# Patient Record
Sex: Female | Born: 1965 | Race: White | Hispanic: No | Marital: Married | Smoking: Former smoker
Health system: Southern US, Community
[De-identification: ages and names within clinical notes are randomized; demographics above are authoritative.]

## PROBLEM LIST (undated history)

## (undated) DIAGNOSIS — I10 Essential (primary) hypertension: Secondary | ICD-10-CM

## (undated) DIAGNOSIS — Z923 Personal history of irradiation: Secondary | ICD-10-CM

## (undated) DIAGNOSIS — R319 Hematuria, unspecified: Secondary | ICD-10-CM

## (undated) DIAGNOSIS — Z8541 Personal history of malignant neoplasm of cervix uteri: Secondary | ICD-10-CM

## (undated) DIAGNOSIS — J302 Other seasonal allergic rhinitis: Secondary | ICD-10-CM

## (undated) DIAGNOSIS — N3289 Other specified disorders of bladder: Secondary | ICD-10-CM

## (undated) DIAGNOSIS — E079 Disorder of thyroid, unspecified: Secondary | ICD-10-CM

## (undated) DIAGNOSIS — N329 Bladder disorder, unspecified: Secondary | ICD-10-CM

## (undated) DIAGNOSIS — J4520 Mild intermittent asthma, uncomplicated: Secondary | ICD-10-CM

## (undated) DIAGNOSIS — R300 Dysuria: Secondary | ICD-10-CM

## (undated) DIAGNOSIS — G709 Myoneural disorder, unspecified: Secondary | ICD-10-CM

## (undated) DIAGNOSIS — R011 Cardiac murmur, unspecified: Secondary | ICD-10-CM

## (undated) HISTORY — DX: Disorder of thyroid, unspecified: E07.9

## (undated) HISTORY — DX: Myoneural disorder, unspecified: G70.9

## (undated) HISTORY — DX: Personal history of irradiation: Z92.3

---

## 1998-01-07 ENCOUNTER — Other Ambulatory Visit: Admission: RE | Admit: 1998-01-07 | Discharge: 1998-01-07 | Payer: Self-pay | Admitting: Obstetrics and Gynecology

## 1998-05-24 ENCOUNTER — Other Ambulatory Visit: Admission: RE | Admit: 1998-05-24 | Discharge: 1998-05-24 | Payer: Self-pay | Admitting: Obstetrics and Gynecology

## 1998-10-11 ENCOUNTER — Other Ambulatory Visit: Admission: RE | Admit: 1998-10-11 | Discharge: 1998-10-11 | Payer: Self-pay | Admitting: Obstetrics and Gynecology

## 1998-11-01 ENCOUNTER — Other Ambulatory Visit: Admission: RE | Admit: 1998-11-01 | Discharge: 1998-11-01 | Payer: Self-pay | Admitting: Obstetrics and Gynecology

## 1999-03-07 ENCOUNTER — Other Ambulatory Visit: Admission: RE | Admit: 1999-03-07 | Discharge: 1999-03-07 | Payer: Self-pay | Admitting: Obstetrics and Gynecology

## 1999-09-29 ENCOUNTER — Inpatient Hospital Stay (HOSPITAL_COMMUNITY): Admission: AD | Admit: 1999-09-29 | Discharge: 1999-09-29 | Payer: Self-pay | Admitting: Obstetrics and Gynecology

## 2000-03-12 ENCOUNTER — Other Ambulatory Visit: Admission: RE | Admit: 2000-03-12 | Discharge: 2000-03-12 | Payer: Self-pay | Admitting: Obstetrics and Gynecology

## 2002-05-12 ENCOUNTER — Other Ambulatory Visit: Admission: RE | Admit: 2002-05-12 | Discharge: 2002-05-12 | Payer: Self-pay | Admitting: Obstetrics and Gynecology

## 2003-05-18 ENCOUNTER — Other Ambulatory Visit: Admission: RE | Admit: 2003-05-18 | Discharge: 2003-05-18 | Payer: Self-pay | Admitting: Obstetrics and Gynecology

## 2004-05-24 ENCOUNTER — Other Ambulatory Visit: Admission: RE | Admit: 2004-05-24 | Discharge: 2004-05-24 | Payer: Self-pay | Admitting: Obstetrics and Gynecology

## 2005-06-07 ENCOUNTER — Other Ambulatory Visit: Admission: RE | Admit: 2005-06-07 | Discharge: 2005-06-07 | Payer: Self-pay | Admitting: Obstetrics and Gynecology

## 2005-11-27 ENCOUNTER — Ambulatory Visit: Payer: Self-pay | Admitting: Family Medicine

## 2005-12-29 ENCOUNTER — Ambulatory Visit: Payer: Self-pay | Admitting: Family Medicine

## 2006-01-15 ENCOUNTER — Ambulatory Visit: Payer: Self-pay | Admitting: Family Medicine

## 2006-02-26 ENCOUNTER — Ambulatory Visit: Payer: Self-pay | Admitting: Family Medicine

## 2006-06-04 ENCOUNTER — Ambulatory Visit: Payer: Self-pay | Admitting: Family Medicine

## 2006-09-04 ENCOUNTER — Telehealth: Payer: Self-pay | Admitting: Family Medicine

## 2006-11-13 ENCOUNTER — Telehealth: Payer: Self-pay | Admitting: Family Medicine

## 2007-05-31 ENCOUNTER — Ambulatory Visit: Payer: Self-pay | Admitting: Family Medicine

## 2007-05-31 DIAGNOSIS — I10 Essential (primary) hypertension: Secondary | ICD-10-CM | POA: Insufficient documentation

## 2007-06-03 LAB — CONVERTED CEMR LAB
ALT: 10 units/L (ref 0–35)
AST: 15 units/L (ref 0–37)
Albumin: 4.2 g/dL (ref 3.5–5.2)
Alkaline Phosphatase: 69 units/L (ref 39–117)
BUN: 9 mg/dL (ref 6–23)
Chloride: 107 meq/L (ref 96–112)
Creatinine, Ser: 0.72 mg/dL (ref 0.40–1.20)
HDL: 41 mg/dL (ref >39)
LDL Cholesterol: 90 mg/dL (ref 0–99)
Potassium: 4.7 meq/L (ref 3.5–5.3)
Total CHOL/HDL Ratio: 3.6

## 2007-11-14 ENCOUNTER — Encounter: Payer: Self-pay | Admitting: Family Medicine

## 2007-11-14 LAB — HM MAMMOGRAPHY: HM Mammogram: NORMAL

## 2008-05-07 ENCOUNTER — Ambulatory Visit: Payer: Self-pay | Admitting: Family Medicine

## 2008-05-07 DIAGNOSIS — F411 Generalized anxiety disorder: Secondary | ICD-10-CM | POA: Insufficient documentation

## 2008-05-15 ENCOUNTER — Telehealth: Payer: Self-pay | Admitting: Family Medicine

## 2008-05-28 ENCOUNTER — Ambulatory Visit: Payer: Self-pay | Admitting: Family Medicine

## 2008-05-28 DIAGNOSIS — G4700 Insomnia, unspecified: Secondary | ICD-10-CM | POA: Insufficient documentation

## 2008-06-02 ENCOUNTER — Telehealth: Payer: Self-pay | Admitting: Family Medicine

## 2008-06-15 ENCOUNTER — Ambulatory Visit: Payer: Self-pay | Admitting: Family Medicine

## 2008-08-31 LAB — CONVERTED CEMR LAB

## 2008-09-02 ENCOUNTER — Ambulatory Visit: Payer: Self-pay | Admitting: Family Medicine

## 2008-09-03 LAB — CONVERTED CEMR LAB
ALT: 10 units/L (ref 0–35)
AST: 19 units/L (ref 0–37)
Albumin: 4.4 g/dL (ref 3.5–5.2)
CO2: 22 meq/L (ref 19–32)
Calcium: 9.3 mg/dL (ref 8.4–10.5)
Chloride: 104 meq/L (ref 96–112)
Cholesterol: 152 mg/dL (ref 0–200)
Platelets: 334 10*3/uL (ref 150–400)
Potassium: 4.7 meq/L (ref 3.5–5.3)
RDW: 13.2 % (ref 11.5–15.5)
Sodium: 138 meq/L (ref 135–145)
TSH: 2.231 microintl units/mL (ref 0.350–4.50)
Total CHOL/HDL Ratio: 3.3
Total Protein: 7.5 g/dL (ref 6.0–8.3)
WBC: 10.4 10*3/uL (ref 4.0–10.5)

## 2008-09-25 HISTORY — PX: FOOT SURGERY: SHX648

## 2008-10-05 ENCOUNTER — Emergency Department (HOSPITAL_COMMUNITY): Admission: EM | Admit: 2008-10-05 | Discharge: 2008-10-06 | Payer: Self-pay | Admitting: Emergency Medicine

## 2008-11-05 ENCOUNTER — Encounter: Payer: Self-pay | Admitting: Family Medicine

## 2008-11-17 ENCOUNTER — Telehealth: Payer: Self-pay | Admitting: Family Medicine

## 2008-12-10 ENCOUNTER — Telehealth: Payer: Self-pay | Admitting: Family Medicine

## 2009-04-20 ENCOUNTER — Encounter: Payer: Self-pay | Admitting: Family Medicine

## 2009-06-21 ENCOUNTER — Telehealth: Payer: Self-pay | Admitting: *Deleted

## 2009-07-07 ENCOUNTER — Ambulatory Visit: Payer: Self-pay | Admitting: Family Medicine

## 2009-07-07 DIAGNOSIS — J4520 Mild intermittent asthma, uncomplicated: Secondary | ICD-10-CM | POA: Insufficient documentation

## 2009-07-12 ENCOUNTER — Telehealth: Payer: Self-pay | Admitting: Family Medicine

## 2009-12-15 ENCOUNTER — Encounter: Payer: Self-pay | Admitting: Family Medicine

## 2010-01-10 ENCOUNTER — Telehealth: Payer: Self-pay | Admitting: Family Medicine

## 2010-10-27 NOTE — Progress Notes (Signed)
Summary: Needs mini inhaler  Phone Note Call from Patient Call back at Home Phone [phone]   Caller: Patient Call For: Nani Gasser MD Summary of Call: Pt uses Ventolin inhaler and went out of town to [place] and left the inhaler there and will not be able to get it back until Thursday but needs something to get her thru until then. Insurance will not pay for a refill on the Ventolin. Can you [facility] and I will call her and let her know that that is where we sent it Initial call taken by: Kathlene November,  January 10, 2010 2:20 PM  Follow-up for Phone Call        Pt notifeid of results. Follow-up by: Kathlene November,  January 10, 2010 3:53 PM    New/Updated Medications: * MINI-INHALER ALBUTEROL 2-3 puffs inhaled every 4-6 hours as needed Prescriptions: MINI-INHALER ALBUTEROL 2-3 puffs inhaled every 4-6 hours as needed  #1 x 0   Entered and Authorized by:   Nani Gasser MD   Signed by:   Nani Gasser MD on 01/10/2010   Method used:   Printed then faxed to ...       [address] [phone]* (retail)       [address] [place], [place]  [address]       Ph: [phone]       Fax: [phone]   RxID:   [id number]

## 2010-10-27 NOTE — Letter (Signed)
Summary: [facility]   Imported By: Lanelle Bal 12/23/2009 10:52:08  _____________________________________________________________________  External Attachment:    Type:   Image     Comment:   External Document

## 2010-11-02 ENCOUNTER — Encounter: Payer: Self-pay | Admitting: Family Medicine

## 2010-11-02 ENCOUNTER — Ambulatory Visit (INDEPENDENT_AMBULATORY_CARE_PROVIDER_SITE_OTHER): Payer: BC Managed Care – PPO | Admitting: Family Medicine

## 2010-11-02 DIAGNOSIS — J45909 Unspecified asthma, uncomplicated: Secondary | ICD-10-CM

## 2010-11-02 DIAGNOSIS — F411 Generalized anxiety disorder: Secondary | ICD-10-CM

## 2010-11-02 DIAGNOSIS — I10 Essential (primary) hypertension: Secondary | ICD-10-CM

## 2010-11-10 NOTE — Assessment & Plan Note (Signed)
Summary: F/u on meds-   Vital Signs:  Patient profile:   45 year old female Height:      62 inches Weight:      141 pounds Pulse rate:   67 / minute BP sitting:   114 / 78  (right arm) Cuff size:   regular  Vitals Entered By: Avon Gully CMA, Duncan Dull) (November 02, 2010 8:12 AM) CC: F/u meds   Primary Care Provider:  Linford Arnold, C  CC:  F/u meds.  History of Present Illness: Has been working long hours at work. Out of the paxil for almost a month. Says this has really affected her mood.    She has also been using her rescue inhaler daily. OUt of her singulair and her symbicort. Also for over a month.  No URI sxs.  Noramlly does really well when on her singulair.   Asthma History    Asthma Control Assessment:    Age range: 12+ years    Symptoms: >2 days/week    Nighttime Awakenings: 0-2/month    Interferes w/ normal activity: no limitations    SABA use (not for EIB): >2 days/week    Asthma Control Assessment: Not Well Controlled   Current Medications (verified): 1)  Metoprolol Tartrate 100 Mg Tabs (Metoprolol Tartrate) .... Take 1 Tablet By Mouth Two Times A Day 2)  Paxil 10 Mg Tabs (Paroxetine Hcl) .... Take 1 Tablet By Mouth Two Times A Day 3)  Trazodone Hcl 50 Mg Tabs (Trazodone Hcl) .... Take 1 Tablet By Mouth Once A Day At Bedtime As Needed Insomonia 4)  Ventolin Hfa 108 (90 Base) Mcg/act Aers (Albuterol Sulfate) .... 2-4 Puffs Inhaled Every  6 Hours As Needed 5)  Symbicort 80-4.5 Mcg/act Aero (Budesonide-Formoterol Fumarate) .... 2 Puffs Two Times A Day 6)  Singulair 10 Mg Tabs (Montelukast Sodium) .... Take 1 Tablet By Mouth Once A Day  Dx: Asthma, Allergies 7)  Mini-Inhaler Albuterol .... 2-3 Puffs Inhaled Every 4-6 Hours As Needed  Allergies (verified): 1)  ! Pcn 2)  ! Sulfa  Comments:  Nurse/Medical Assistant: The patient's medications and allergies were reviewed with the patient and were updated in the Medication and Allergy Lists. Avon Gully CMA,  Duncan Dull) (November 02, 2010 8:13 AM)  Physical Exam  General:  Well-developed,well-nourished,in no acute distress; alert,appropriate and cooperative throughout examination Head:  Normocephalic and atraumatic without obvious abnormalities. No apparent alopecia or balding. Eyes:  No corneal or conjunctival inflammation noted. EOMI. Perrla. Ears:  External ear exam shows no significant lesions or deformities.  Otoscopic examination reveals clear canals, tympanic membranes are intact bilaterally without bulging, retraction, inflammation or discharge. Hearing is grossly normal bilaterally. Nose:  External nasal examination shows no deformity or inflammation.  Mouth:  Oral mucosa and oropharynx without lesions or exudates.  Teeth in good repair. Neck:  No deformities, masses, or tenderness noted. Lungs:  Normal respiratory effort, chest expands symmetrically. Lungs are clear to auscultation, no crackles or wheezes. Heart:  Normal rate and regular rhythm. S1 and S2 normal without gallop, murmur, click, rub or other extra sounds. Skin:  no rashes.   Cervical Nodes:  No lymphadenopathy noted Psych:  Cognition and judgment appear intact. Alert and cooperative with normal attention span and concentration. No apparent delusions, illusions, hallucinations   Impression & Recommendations:  Problem # 1:  GENERALIZED ANXIETY DISORDER (ICD-300.02) Out of Paxil and GAD-7 score of 15.  Will restart her paxil once a day.  Her updated medication list for this problem  includes:    Paxil 10 Mg Tabs (Paroxetine hcl) .[name]... Take 1 tablet by mouth once a day    Trazodone Hcl 50 Mg Tabs (Trazodone hcl) .[name]... Take 1 tablet by mouth once a day at bedtime as needed insomonia  Problem # 2:  HYPERTENSION, BENIGN (ICD-401.1) Looks great today. Contineu current regimen. It has really helped congtrol her palpitations.  Her updated medication list for this problem includes:    Metoprolol Tartrate 100 Mg Tabs (Metoprolol  tartrate) .[name]... Take 1 tablet by mouth two times a day  Problem # 3:  ASTHMA, UNSPECIFIED, UNSPECIFIED STATUS (ICD-493.90) Dsicussed needs to restart her singulari and will start an inhaled corticosteroid. F/U in 6 weeks for recheck.  Her updated medication list for this problem includes:    Ventolin Hfa 108 (90 Base) Mcg/act Aers (Albuterol sulfate) .[name]... 2-4 puffs inhaled every  6 hours as needed    Qvar 40 Mcg/act Aers (Beclomethasone dipropionate) .[name]... 1 puff inhaled    Singulair 10 Mg Tabs (Montelukast sodium) .[name]... Take 1 tablet by mouth once a day  dx: asthma, allergies  Complete Medication List: 1)  Metoprolol Tartrate 100 Mg Tabs (Metoprolol tartrate) .... Take 1 tablet by mouth two times a day 2)  Paxil 10 Mg Tabs (Paroxetine hcl) .... Take 1 tablet by mouth once a day 3)  Trazodone Hcl 50 Mg Tabs (Trazodone hcl) .... Take 1 tablet by mouth once a day at bedtime as needed insomonia 4)  Ventolin Hfa 108 (90 Base) Mcg/act Aers (Albuterol sulfate) .... 2-4 puffs inhaled every  6 hours as needed 5)  Qvar 40 Mcg/act Aers (Beclomethasone dipropionate) .[name].. 1 puff inhaled 6)  Singulair 10 Mg Tabs (Montelukast sodium) .... Take 1 tablet by mouth once a day  dx: asthma, allergies 7)  Mini-inhaler Albuterol  .... 2-3 puffs inhaled every 4-6 hours as needed 8)  Zyrtec Allergy 10 Mg Tabs (Cetirizine hcl) .... Take 1 tablet by mouth once a day at bedtime   Patient Instructions: 1)  QVAR, Flovent  (inhaled steroid for asthma) 2)  follow up in 6 weeks to re-evaluate your asthma and mood Prescriptions: QVAR 40 MCG/ACT AERS (BECLOMETHASONE DIPROPIONATE) 1 puff inhaled  #1 x 1   Entered and Authorized by:   Nani Gasser MD   Signed by:   Nani Gasser MD on 11/02/2010   Method used:   Electronically to        [facility] [phone]* (retail)       [address] [place], [place]  [address]       Ph: [phone]       Fax: [phone]   RxID:   [id number] SINGULAIR 10 MG  TABS (MONTELUKAST SODIUM) Take 1 tablet by mouth once a day  Dx: Asthma, allergies  #90 x 3   Entered and Authorized by:   Nani Gasser MD   Signed by:   Nani Gasser MD on 11/02/2010   Method used:   Electronically to        [facility] [phone]* (retail)       [address] [place], [place]  [address]       Ph: [phone]       Fax: [phone]   RxID:   [id number] TRAZODONE HCL 50 MG TABS (TRAZODONE HCL) Take 1 tablet by mouth once a day at bedtime as needed insomonia  #90 x 3   Entered and  Authorized by:   Nani Gasser MD   Signed by:   Nani Gasser MD on 11/02/2010   Method used:   Electronically to        [facility] [phone]* (retail)       [address] [place], [place]  [address]       Ph: [phone]       Fax: [phone]   RxID:   [id number] PAXIL 10 MG TABS (PAROXETINE HCL) Take 1 tablet by mouth two times a day  #90 x 3   Entered and Authorized by:   Nani Gasser MD   Signed by:   Nani Gasser MD on 11/02/2010   Method used:   Electronically to        [facility] [phone]* (retail)       [address] [place], [place]  [address]       Ph: [phone]       Fax: [phone]   RxID:   [id number] METOPROLOL TARTRATE 100 MG TABS (METOPROLOL TARTRATE) Take 1 tablet by mouth two times a day  #180 x 3   Entered and Authorized by:   Nani Gasser MD   Signed by:   Nani Gasser MD on 11/02/2010   Method used:   Electronically to        [facility] [phone]* (retail)       [address] [place], [place]  [address]       Ph: [phone]       Fax: [phone]   RxID:   [id number]    Orders Added: 1)  Est. Patient Level IV [[address]]

## 2010-11-22 NOTE — Letter (Signed)
Summary: Generalized Anxiety Disorder Scale  Generalized Anxiety Disorder Scale   Imported By: Maryln Gottron 11/14/2010 14:03:12  _____________________________________________________________________  External Attachment:    Type:   Image     Comment:   External Document

## 2010-12-09 ENCOUNTER — Encounter: Payer: Self-pay | Admitting: Family Medicine

## 2010-12-14 ENCOUNTER — Ambulatory Visit (INDEPENDENT_AMBULATORY_CARE_PROVIDER_SITE_OTHER): Payer: BC Managed Care – PPO | Admitting: Family Medicine

## 2010-12-14 VITALS — BP 121/83 | HR 78 | Ht 62.0 in | Wt 141.0 lb

## 2010-12-14 DIAGNOSIS — G4700 Insomnia, unspecified: Secondary | ICD-10-CM

## 2010-12-14 DIAGNOSIS — F329 Major depressive disorder, single episode, unspecified: Secondary | ICD-10-CM

## 2010-12-14 DIAGNOSIS — F32A Depression, unspecified: Secondary | ICD-10-CM

## 2010-12-14 DIAGNOSIS — I10 Essential (primary) hypertension: Secondary | ICD-10-CM

## 2010-12-14 MED ORDER — ZOLPIDEM TARTRATE 10 MG PO TABS: 10.0000 mg | ORAL_TABLET | Freq: Every evening | ORAL | Status: DC | PRN

## 2010-12-14 NOTE — Patient Instructions (Signed)
  Insomnia Insomnia means you have trouble falling or staying asleep.  It affects about one person in three at different times and is usually related to stress from work, school, or personal relations. Insomnia is also a sign of depression or anxiety. Other medical problems that cause insomnia include conditions that cause pain, night leg cramps, coughing, shortness of breath, urinary problems, and fevers. Sleep apnea is an abnormal breathing pattern at night that can cause insomnia and loud snoring. Certain medications and excess intake of caffeine drinks (coffee, tea, colas) can also interfere with normal sleep. Treatment for insomnia depends on the cause. Besides specific medical treatment, the following measures can help you relax and get better sleep. Get regular exercise every day, at least several hours before bed time. Try to get to bed at the same time every night. Take a hot bath before retiring to help you relax. Do not stay in bed if you are unable to sleep. During the daytime avoid staying in bed to watch television, eat, or read. Reduce unwanted noise and light in your room. Keep your room at a comfortable temperature. Avoid alcohol as it causes one to sleep less soundly, may cause you to awaken during the night, and can leave you feeling groggy the next day. Using a mild sedative prescribed or suggested by your caregiver may be needed, but the daily use of sleeping pills is not recommended. Anti-depressant medicines can improve sleep in people with depression. Please call your doctor for follow up care to better understand the cause and proper treatment of your insomnia. Document Released: 10/19/2004 Document Re-Released: 12/08/2008 [facility] Patient Information 2011 [place], [place].

## 2010-12-14 NOTE — Progress Notes (Signed)
  Subjective:    Patient ID: Anna Ramsey, female    DOB: 24-Oct-1965, 45 y.o.   MRN: [id number]  HPI Trazodone really helps her sleep, once she falls asleep. .  Does have vivid dreams with it.  Still having  A hard time falling alseep. Often works until 9PM. Does have chronic insomnia. Her mood has been very well controlled. No SE of meds.  Focus and concentration are good. 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    Review of Systems     Objective:   Physical Exam  Cardiovascular: Normal rate, regular rhythm and normal heart sounds.   Pulmonary/Chest: Effort normal and breath sounds normal.   BP 121/83  Pulse 78  Wt 141 lb (63.957 kg)        Assessment & Plan:  Depression doing well. PHQ--9 score of 2. At goal. Continue current regimen. F/U in 4 months. Continue to get regular exercise.     CInsomnia- sleep induction if a problem.  Will d/c trazodone and change to [place].  F/U 6 weeks for sleep.

## 2010-12-14 NOTE — Assessment & Plan Note (Signed)
AT goal today. Continue current regimen.

## 2011-01-17 ENCOUNTER — Telehealth: Payer: Self-pay | Admitting: *Deleted

## 2011-01-17 ENCOUNTER — Ambulatory Visit (INDEPENDENT_AMBULATORY_CARE_PROVIDER_SITE_OTHER): Payer: BC Managed Care – PPO | Admitting: Family Medicine

## 2011-01-17 VITALS — BP 115/72 | HR 68 | Wt 141.0 lb

## 2011-01-17 DIAGNOSIS — G4700 Insomnia, unspecified: Secondary | ICD-10-CM

## 2011-01-17 MED ORDER — ZOLPIDEM TARTRATE ER 12.5 MG PO TBCR: 12.5000 mg | EXTENDED_RELEASE_TABLET | Freq: Every evening | ORAL | Status: DC | PRN

## 2011-01-17 MED ORDER — ESZOPICLONE 3 MG PO TABS: 3.0000 mg | ORAL_TABLET | Freq: Every evening | ORAL | Status: DC | PRN

## 2011-01-17 NOTE — Progress Notes (Signed)
  Subjective:    Patient ID: Anna Ramsey, female    DOB: November 07, 1965, 45 y.o.   MRN: [id number]  HPI  See the A&P.   Review of Systems     Objective:   Physical Exam  Constitutional: She appears well-developed and well-nourished.  HENT:  Head: Normocephalic and atraumatic.  Neck: No thyromegaly present.  Cardiovascular: Normal rate, regular rhythm and normal heart sounds.   Pulmonary/Chest: Effort normal and breath sounds normal.  Lymphadenopathy:    She has no cervical adenopathy.          Assessment & Plan:

## 2011-01-17 NOTE — Telephone Encounter (Signed)
Pt LMOM stating Alfonso Patten is too expensive and would like this changed to Ambien CR.

## 2011-01-17 NOTE — Assessment & Plan Note (Addendum)
The Remus Loffler works really well to fall asleep. Says will fall asleep in about 10-20 min but then tosses and turns all night.  Feels the trazodone makes her sleep deeper but takes about 1 hour to sleep.  She tried taking them together and that worked really well. Discussed trial fo lunesta or tiral of ambien CR or possibly just taking her trazodone earlier in the evening.  She would like to try lunesta first.

## 2011-01-17 NOTE — Telephone Encounter (Signed)
OK, will fax over new rx.

## 2011-01-20 ENCOUNTER — Telehealth: Payer: Self-pay | Admitting: Family Medicine

## 2011-01-20 NOTE — Telephone Encounter (Signed)
Telephone call received from patjent stating we faxed over a script on Tuesday for Ambien CR since Lunesta was a tier 3 (too expensive).  Pt is calling back this am saying script is not at pharmacy [facility].  Since med has been ordered by M.D.will proceed to get script taken care of for pt. PLAN: 1.  Called [facility] and gave prescription verbally.\             2.  Notified pt that script was called, but had to [facility] that done.

## 2011-02-15 ENCOUNTER — Other Ambulatory Visit: Payer: Self-pay | Admitting: Family Medicine

## 2011-02-15 NOTE — Telephone Encounter (Signed)
Pt requesting refill albuterol MDI.  Left her medication in [facility] accidentally.  It is for albuterol mini inhaler. Plan:  #1/2 refills sent to [facility] escribe. Jarvis Newcomer, LPN Domingo Dimes

## 2011-02-16 ENCOUNTER — Other Ambulatory Visit: Payer: Self-pay | Admitting: Family Medicine

## 2011-02-16 MED ORDER — AMBULATORY NON FORMULARY MEDICATION: 90.0000 ug | Status: DC | PRN

## 2011-02-16 NOTE — Telephone Encounter (Signed)
Pt called and said the mini inhaler was not at the pharm yesterday as we had talked about.   Plan:  Called [facility] and they can no longer get the mini inhaler from their supplier, but they could do 90 mcg mini inhaler.  Ordered #1/2 refills and pt informed to pick up today. Jarvis Newcomer, LPN Domingo Dimes

## 2011-02-23 NOTE — Telephone Encounter (Signed)
This encounter was completed by the triage nurse, therefore this encounter was closed. Jarvis Newcomer, LPN Domingo Dimes

## 2011-05-30 ENCOUNTER — Other Ambulatory Visit: Payer: Self-pay | Admitting: Family Medicine

## 2011-09-20 ENCOUNTER — Other Ambulatory Visit: Payer: Self-pay | Admitting: Family Medicine

## 2011-09-22 ENCOUNTER — Emergency Department
Admission: EM | Admit: 2011-09-22 | Discharge: 2011-09-22 | Disposition: A | Discharge status: Home / Self Care | Payer: BC Managed Care – PPO | Source: Home / Self Care | Attending: Family Medicine | Admitting: Family Medicine

## 2011-09-22 ENCOUNTER — Encounter: Payer: Self-pay | Admitting: Emergency Medicine

## 2011-09-22 DIAGNOSIS — R42 Dizziness and giddiness: Secondary | ICD-10-CM

## 2011-09-22 DIAGNOSIS — J069 Acute upper respiratory infection, unspecified: Secondary | ICD-10-CM

## 2011-09-22 LAB — POCT CBC W AUTO DIFF (K'VILLE URGENT CARE)

## 2011-09-22 MED ORDER — MECLIZINE HCL 25 MG PO TABS: ORAL_TABLET | ORAL | Status: DC

## 2011-09-22 MED ORDER — BENZONATATE 200 MG PO CAPS: 200.0000 mg | ORAL_CAPSULE | Freq: Every day | ORAL | Status: AC

## 2011-09-22 NOTE — ED Provider Notes (Signed)
History     CSN: [id number]  Arrival date & time 09/22/11  1048   First MD Initiated Contact with Patient 09/22/11 1307      Chief Complaint  Patient presents with  . Fatigue      HPI Comments: HPI : Flu symptoms started about 4 days ago.  Complains of onset of chills, sweats, myalgias, fatigue, headache, cough.  Symptoms are progressively worsening, despite trying OTC fever reducing medicine and rest and fluids. Has decreased appetite, but tolerating some liquids by mouth.  She has had occasional nausea with vomiting, and feels dizzy with movement.  She had a flu shot in October.   Review of Systems: Positive for fatigue, mild nasal congestion, mild sore throat, mild swollen anterior neck glands, mild cough, nausea, dizziness. Negative for acute vision changes, stiff neck, focal weakness, syncope, seizures, respiratory distress, diarrhea, GU symptoms.   The history is provided by the patient.    Past Medical History  Diagnosis Date  . Asthma     hx in childhood    Past Surgical History  Procedure Date  . Foot surgery     Family History  Problem Relation Age of Onset  . Heart attack Father 21  . Diabetes Father   . Hypertension Maternal Grandmother   . Stroke Maternal Grandmother     History  Substance Use Topics  . Smoking status: Former Smoker    Quit date: 09/25/2004  . Smokeless tobacco: Not on file  . Alcohol Use: 0.5 oz/week    1 drink(s) per week    OB History    Grav Para Term Preterm Abortions TAB SAB Ect Mult Living                  Review of Systems No sore throat + cough No pleuritic pain No wheezing Minimal nasal congestion No post-nasal drainage No sinus pain/pressure No itchy/red eyes No earache No hemoptysis No SOB + fever/chills + nausea + occasional vomiting No abdominal pain No diarrhea No urinary symptoms No skin rashes + fatigue + myalgias + headache Used OTC meds without relief  Allergies  Penicillins and  Sulfonamide derivatives  Home Medications   Current Outpatient Rx  Name Route Sig Dispense Refill  . AMBULATORY NON FORMULARY MEDICATION Inhalation Inhale 90 mcg into the lungs every 4 (four) hours as needed. Medication Name: **mini inhaler albuterol* 1 Inhaler 2  . BECLOMETHASONE DIPROPIONATE 40 MCG/ACT IN AERS Inhalation Inhale 1 puff into the lungs.      [name] BENZONATATE 200 MG PO CAPS Oral Take 1 capsule (200 mg total) by mouth at bedtime. Take as needed for cough 12 capsule 0  . CETIRIZINE HCL 10 MG PO TABS Oral Take 10 mg by mouth at bedtime.      [name] ESZOPICLONE 3 MG PO TABS Oral Take 1 tablet (3 mg total) by mouth at bedtime as needed. Take immediately before bedtime 30 tablet 0  . MECLIZINE HCL 25 MG PO TABS  One tab by mouth 3 or 4 times daily as needed for dizziness. 20 tablet 0  . METOPROLOL TARTRATE 100 MG PO TABS Oral Take 100 mg by mouth 2 (two) times daily.      [name] MONTELUKAST SODIUM 10 MG PO TABS Oral Take 10 mg by mouth daily.      [name] PAROXETINE HCL 10 MG PO TABS Oral Take 10 mg by mouth every morning.      . VENTOLIN HFA 108 (90 BASE) MCG/ACT IN AERS  INHALE  TWO TO FOUR PUFFS BY MOUTH EVERY 6 HOURS AS NEEDED 18 g 1  . ZOLPIDEM TARTRATE ER 12.5 MG PO TBCR Oral Take 1 tablet (12.5 mg total) by mouth at bedtime as needed for sleep. 30 tablet 1    BP 145/105  Pulse 92  Temp(Src) 98.2 F (36.8 C) (Oral)  Resp 16  Ht 5' 1.5" (1.562 m)  Wt 143 lb (64.864 kg)  BMI 26.58 kg/m2  SpO2 100%  Physical Exam Nursing notes and Vital Signs reviewed. Appearance:  Patient appears healthy, stated age, and in no acute distress Eyes:  Pupils are equal, round, and reactive to light and accomodation.  Extraocular movement is intact.  Conjunctivae are not inflamed  Ears:  Canals normal.  Tympanic membranes normal.  Nose:  Mildly congested turbinates.  No sinus tenderness.   Pharynx:  Normal Neck:  Supple.  Tender shotty posterior nodes are palpated bilaterally  Lungs:  Clear to auscultation.   Breath sounds are equal.  Heart:  Regular rate and rhythm without murmurs, rubs, or gallops.  Abdomen:  Nontender without masses or hepatosplenomegaly.  Bowel sounds are present.  No CVA or flank tenderness.  Extremities:  No edema.  No calf tenderness Skin:  No rash present.   ED Course  Procedures none   Labs Reviewed  POCT CBC W AUTO DIFF ([facility]):  CBC:  WBC 6.4; LY 32.7; MO 4.8; GR 62.5; Hgb 17.2       1. Vertigo   2. Acute upper respiratory infections of unspecified site       MDM  There is no evidence of bacterial infection today.  Suspect viral URI with vertigo. Treat symptomatically for now:  Begin Antivert for dizziness and nausea.  Tessalon for cough at night. Take Mucinex  (guaifenesin) twice daily for congestion.  Increase fluid intake, rest. May use Afrin nasal spray (or generic oxymetazoline) twice daily for about 5 days.  Also recommend using saline nasal spray several times daily and/or saline nasal irrigation. Stop all antihistamines for now, and other non-prescription cough/cold preparations. May take Tylenol for headache. Follow-up with family doctor if not improving about one week.        Donna Christen, MD 09/22/11 [phone]

## 2011-09-22 NOTE — ED Notes (Signed)
Extremely fatigued, cough, nausea x 5 days; did have Flu vaccine in October; no fever.

## 2011-09-28 ENCOUNTER — Ambulatory Visit: Payer: BC Managed Care – PPO | Admitting: Family Medicine

## 2011-11-01 ENCOUNTER — Other Ambulatory Visit: Payer: Self-pay | Admitting: Family Medicine

## 2012-01-31 ENCOUNTER — Other Ambulatory Visit: Payer: Self-pay | Admitting: Family Medicine

## 2012-01-31 NOTE — Telephone Encounter (Signed)
Pt has sent request for a refill on Trazadone. Pt has was on Trazadone over a year ago. Please advise if we can refill.

## 2012-01-31 NOTE — Telephone Encounter (Signed)
Refill for 1 month but needs office visit.

## 2012-02-06 ENCOUNTER — Encounter: Payer: Self-pay | Admitting: *Deleted

## 2012-02-06 ENCOUNTER — Emergency Department (INDEPENDENT_AMBULATORY_CARE_PROVIDER_SITE_OTHER)
Admission: EM | Admit: 2012-02-06 | Discharge: 2012-02-06 | Disposition: A | Discharge status: Home / Self Care | Payer: BC Managed Care – PPO | Source: Home / Self Care | Attending: Emergency Medicine | Admitting: Emergency Medicine

## 2012-02-06 DIAGNOSIS — T65891A Toxic effect of other specified substances, accidental (unintentional), initial encounter: Secondary | ICD-10-CM

## 2012-02-06 DIAGNOSIS — J680 Bronchitis and pneumonitis due to chemicals, gases, fumes and vapors: Secondary | ICD-10-CM

## 2012-02-06 DIAGNOSIS — R062 Wheezing: Secondary | ICD-10-CM

## 2012-02-06 HISTORY — DX: Other seasonal allergic rhinitis: J30.2

## 2012-02-06 HISTORY — DX: Essential (primary) hypertension: I10

## 2012-02-06 MED ORDER — PREDNISONE (PAK) 10 MG PO TABS: 10.0000 mg | ORAL_TABLET | Freq: Every day | ORAL | Status: AC

## 2012-02-06 MED ORDER — METHYLPREDNISOLONE SODIUM SUCC 125 MG IJ SOLR: 125.0000 mg | Freq: Once | INTRAMUSCULAR | Status: DC

## 2012-02-06 MED ORDER — METHYLPREDNISOLONE SODIUM SUCC 125 MG IJ SOLR
125.0000 mg | Freq: Once | INTRAMUSCULAR | Status: AC
  Administered 2012-02-06: 125 mg via INTRAMUSCULAR

## 2012-02-06 NOTE — ED Notes (Signed)
Pt c/o cough x this AM. She states that she cleaned her bathroom on Sunday and wonders if this is related. She has used her ventolin inhaler with no relief.

## 2012-02-06 NOTE — ED Provider Notes (Signed)
History     CSN: [id number]  Arrival date & time 02/06/12  1551   First MD Initiated Contact with Patient 02/06/12 1616      Chief Complaint  Patient presents with  . 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  She was treated with prednisone in the past which helped a lot.  She also has a history of asthma and allergies and has been using her inhaler more often recently.  She was again cleaning her bathroom with some cleaning products and now she is feeling that her wheezing is increasing as well as her shortness of breath.  No fever, chills, nausea, vomiting.  She called her PCP who directed her to come here in clinic to be evaluated.  She's asking for a prescription for prednisone.  Past Medical History  Diagnosis Date  . Asthma     hx in childhood  . Hypertension   . Seasonal allergies     Past Surgical History  Procedure Date  . Foot surgery     Family History  Problem Relation Age of Onset  . Heart attack Father 46  . Diabetes Father   . Hypertension Maternal Grandmother   . Stroke Maternal Grandmother   . Cancer Mother     breast/thyroid    History  Substance Use Topics  . Smoking status: Former Smoker    Quit date: 09/25/2004  . Smokeless tobacco: Not on file  . Alcohol Use: 0.5 oz/week    1 drink(s) per week    OB History    Grav Para Term Preterm Abortions TAB SAB Ect Mult Living                  Review of Systems  All other systems reviewed and are negative.    Allergies  Penicillins and Sulfonamide derivatives  Home Medications   Current Outpatient Rx  Name Route Sig Dispense Refill  . AMBULATORY NON FORMULARY MEDICATION Inhalation Inhale 90 mcg into the lungs every 4 (four) hours as needed. Medication Name: **mini inhaler albuterol* 1 Inhaler 2  .  BECLOMETHASONE DIPROPIONATE 40 MCG/ACT IN AERS Inhalation Inhale 1 puff into the lungs.      . CETIRIZINE HCL 10 MG PO TABS Oral Take 10 mg by mouth at bedtime.      [name] ESZOPICLONE 3 MG PO TABS Oral Take 1 tablet (3 mg total) by mouth at bedtime as needed. Take immediately before bedtime 30 tablet 0  . MECLIZINE HCL 25 MG PO TABS  One tab by mouth 3 or 4 times daily as needed for dizziness. 20 tablet 0  . METOPROLOL TARTRATE 100 MG PO TABS Oral Take 100 mg by mouth 2 (two) times daily.      [name] MONTELUKAST SODIUM 10 MG PO TABS Oral Take 10 mg by mouth daily.      [name] PAROXETINE HCL 10 MG PO TABS  TAKE ONE TABLET BY MOUTH TWICE DAILY 90 tablet 1  . PREDNISONE (PAK) 10 MG PO TABS Oral Take 1 tablet (10 mg total) by mouth daily. 6 day pack, use as directed, Disp 1 pack 21 tablet 0  . TRAZODONE HCL 50 MG PO TABS  TAKE ONE TABLET BY MOUTH EVERY DAY AT BEDTIME AS NEEDED FOR INSOMNIA 30 tablet 0    Needs appointment  . VENTOLIN HFA 108 (90 BASE) MCG/ACT IN AERS  INHALE TWO  TO FOUR PUFFS BY MOUTH EVERY 6 HOURS AS NEEDED 18 g 1  . ZOLPIDEM TARTRATE ER 12.5 MG PO TBCR Oral Take 1 tablet (12.5 mg total) by mouth at bedtime as needed for sleep. 30 tablet 1  . ZOLPIDEM TARTRATE 10 MG PO TABS Oral Take 1 tablet (10 mg total) by mouth at bedtime as needed for sleep. 30 tablet 0    BP 155/96  Pulse 99  Temp(Src) 98.2 F (36.8 C) (Oral)  Resp 16  Ht 5' 1.5" (1.562 m)  Wt 146 lb (66.225 kg)  BMI 27.14 kg/m2  SpO2 98%  Physical Exam  Nursing note and vitals reviewed. Constitutional: She is oriented to person, place, and time. She appears well-developed and well-nourished.  HENT:  Head: Normocephalic and atraumatic.  Right Ear: Tympanic membrane, external ear and ear canal normal.  Left Ear: External ear and ear canal normal.  Nose: Nose normal.  Mouth/Throat: Uvula is midline and oropharynx is clear and moist. No oropharyngeal exudate or posterior oropharyngeal erythema.  Eyes: No scleral icterus.  Neck:  Neck supple.  Cardiovascular: Regular rhythm and normal heart sounds.   Pulmonary/Chest: Effort normal. No respiratory distress. She has no decreased breath sounds. She has wheezes (scattered mild). She has no rhonchi.  Neurological: She is alert and oriented to person, place, and time.  Skin: Skin is warm and dry. No rash noted.  Psychiatric: She has a normal mood and affect. Her speech is normal.    ED Course  Procedures (including critical care time)  Labs Reviewed - No data to display No results found.   1. Chemical pneumonitis     Wheezing improved s/p Duoneb treatment   MDM   This patient has a history of chemical pneumonitis and likely has some irritation again.  We gave her a nebulizer treatment in clinic which did help her a little bit.  We also gave her a shot of Solu-Medrol and a prescription for prednisone that she is going to start tomorrow if needed.  She is to followup with her PCP if symptoms are continuing.  Hydrate and use her inhaler as needed.  Marlaine Hind, MD 02/06/12 [phone]

## 2012-02-13 ENCOUNTER — Other Ambulatory Visit: Payer: Self-pay | Admitting: Family Medicine

## 2012-02-15 ENCOUNTER — Emergency Department
Admit: 2012-02-15 | Discharge: 2012-02-15 | Disposition: A | Discharge status: Home / Self Care | Payer: BC Managed Care – PPO | Attending: Emergency Medicine | Admitting: Emergency Medicine

## 2012-02-15 ENCOUNTER — Encounter: Payer: Self-pay | Admitting: *Deleted

## 2012-02-15 ENCOUNTER — Emergency Department (INDEPENDENT_AMBULATORY_CARE_PROVIDER_SITE_OTHER)
Admission: EM | Admit: 2012-02-15 | Discharge: 2012-02-15 | Disposition: A | Discharge status: Home / Self Care | Payer: BC Managed Care – PPO | Source: Home / Self Care | Attending: Emergency Medicine | Admitting: Emergency Medicine

## 2012-02-15 DIAGNOSIS — R059 Cough, unspecified: Secondary | ICD-10-CM

## 2012-02-15 DIAGNOSIS — R05 Cough: Secondary | ICD-10-CM

## 2012-02-15 DIAGNOSIS — R062 Wheezing: Secondary | ICD-10-CM

## 2012-02-15 MED ORDER — PREDNISONE 10 MG PO TABS: ORAL_TABLET | ORAL | Status: DC

## 2012-02-15 MED ORDER — LEVOFLOXACIN 500 MG PO TABS: 500.0000 mg | ORAL_TABLET | Freq: Every day | ORAL | Status: DC

## 2012-02-15 NOTE — ED Provider Notes (Signed)
History     CSN: [id number]  Arrival date & time 02/15/12  1505   First MD Initiated Contact with Patient 02/15/12 1507      Chief Complaint  Patient presents with  . Cough  . Wheezing    (Consider location/radiation/quality/duration/timing/severity/associated sxs/prior treatment) HPI This patient was here last week for bronchitis and shortness of breath and wheezing.  She took a Z-Pak, prednisone, and albuterol inhaler.  She states that she did not get any better whatsoever.  Her cough is still present and nonproductive been mild.  She is mostly feeling chest congestion.  No fever chills.  Some fatigue and malaise.  No other sick contacts.  Past Medical History  Diagnosis Date  . Asthma     hx in childhood  . Hypertension   . Seasonal allergies     Past Surgical History  Procedure Date  . Foot surgery     Family History  Problem Relation Age of Onset  . Heart attack Father 19  . Diabetes Father   . Hypertension Maternal Grandmother   . Stroke Maternal Grandmother   . Cancer Mother     breast/thyroid    History  Substance Use Topics  . Smoking status: Former Smoker    Quit date: 09/25/2004  . Smokeless tobacco: Not on file  . Alcohol Use: 0.5 oz/week    1 drink(s) per week    OB History    Grav Para Term Preterm Abortions TAB SAB Ect Mult Living                  Review of Systems  All other systems reviewed and are negative.    Allergies  Penicillins and Sulfonamide derivatives  Home Medications   Current Outpatient Rx  Name Route Sig Dispense Refill  . AMBULATORY NON FORMULARY MEDICATION Inhalation Inhale 90 mcg into the lungs every 4 (four) hours as needed. Medication Name: **mini inhaler albuterol* 1 Inhaler 2  . BECLOMETHASONE DIPROPIONATE 40 MCG/ACT IN AERS Inhalation Inhale 1 puff into the lungs.      . CETIRIZINE HCL 10 MG PO TABS Oral Take 10 mg by mouth at bedtime.      [name] ESZOPICLONE 3 MG PO TABS Oral Take 1 tablet (3 mg total) by mouth  at bedtime as needed. Take immediately before bedtime 30 tablet 0  . LEVOFLOXACIN 500 MG PO TABS Oral Take 1 tablet (500 mg total) by mouth daily. 7 tablet 0  . MECLIZINE HCL 25 MG PO TABS  One tab by mouth 3 or 4 times daily as needed for dizziness. 20 tablet 0  . METOPROLOL TARTRATE 100 MG PO TABS Oral Take 100 mg by mouth 2 (two) times daily.      [name] MONTELUKAST SODIUM 10 MG PO TABS Oral Take 10 mg by mouth daily.      [name] PAROXETINE HCL 10 MG PO TABS  TAKE ONE TABLET BY MOUTH TWICE DAILY 90 tablet 1  . PREDNISONE 10 MG PO TABS  20mg  bid for 3 days, 10mg  bid for 3 days, then 10mg  daily for 3 days, then stop 21 tablet 0  . PREDNISONE (PAK) 10 MG PO TABS Oral Take 1 tablet (10 mg total) by mouth daily. 6 day pack, use as directed, Disp 1 pack 21 tablet 0  . TRAZODONE HCL 50 MG PO TABS  TAKE ONE TABLET BY MOUTH EVERY DAY AT BEDTIME AS NEEDED FOR INSOMNIA 30 tablet 0    Needs appointment  . VENTOLIN HFA 108 (  90 BASE) MCG/ACT IN AERS  INHALE TWO TO FOUR PUFFS BY MOUTH EVERY 6 HOURS AS NEEDED 18 g 1  . ZOLPIDEM TARTRATE ER 12.5 MG PO TBCR Oral Take 1 tablet (12.5 mg total) by mouth at bedtime as needed for sleep. 30 tablet 1  . ZOLPIDEM TARTRATE 10 MG PO TABS Oral Take 1 tablet (10 mg total) by mouth at bedtime as needed for sleep. 30 tablet 0    BP 132/92  Pulse 103  Temp(Src) 98.5 F (36.9 C) (Oral)  Resp 16  Wt 145 lb (65.772 kg)  SpO2 96%  Physical Exam  Nursing note and vitals reviewed. Constitutional: She is oriented to person, place, and time. She appears well-developed and well-nourished.  HENT:  Head: Normocephalic and atraumatic.  Right Ear: Tympanic membrane, external ear and ear canal normal.  Left Ear: Tympanic membrane, external ear and ear canal normal.  Nose: Mucosal edema and rhinorrhea present.  Mouth/Throat: Posterior oropharyngeal erythema present. No oropharyngeal exudate or posterior oropharyngeal edema.  Eyes: No scleral icterus.  Neck: Neck supple.  Cardiovascular:  Normal rate, regular rhythm and normal heart sounds.   Pulmonary/Chest: No accessory muscle usage. No apnea. No respiratory distress. She has no decreased breath sounds. She has wheezes (scattered bilateral). She has no rhonchi.  Neurological: She is alert and oriented to person, place, and time.  Skin: Skin is warm and dry.  Psychiatric: She has a normal mood and affect. Her speech is normal.    ED Course  Procedures (including critical care time)  Labs Reviewed - No data to display Dg Chest 2 View  02/15/2012  *RADIOLOGY REPORT*  Clinical Data: Cough and wheezing.  CHEST - 2 VIEW  Comparison: None.  Findings: Trachea is midline.  Heart size normal.  Lungs are clear. No pleural fluid.  Prominent pectus deformity.  IMPRESSION: No acute findings.  Original Report Authenticated By: Reyes Ivan, M.D.     1. Cough       MDM   We obtained a chest x-ray and is read by radiologist as above.  I would like her to followup with PCP to find out if she needs more chronic treatment with medicine such as Singulair or Advair, etc.  Today we placed her on Levaquin and a another course of prednisone.  She can use her inhaler as well.  She is going to make an appt with her PCP for next week.      Marlaine Hind, MD 02/15/12 [phone]

## 2012-02-15 NOTE — ED Notes (Signed)
Patient has not seen any improvement since being seen 1 week ago. She finished z-pak and prednisone. Her cough is still present and is still non-productive.

## 2012-02-26 ENCOUNTER — Encounter: Payer: Self-pay | Admitting: Family Medicine

## 2012-02-26 ENCOUNTER — Ambulatory Visit (INDEPENDENT_AMBULATORY_CARE_PROVIDER_SITE_OTHER): Payer: BC Managed Care – PPO | Admitting: Family Medicine

## 2012-02-26 VITALS — BP 127/79 | HR 90 | Ht 62.0 in | Wt 148.0 lb

## 2012-02-26 DIAGNOSIS — J309 Allergic rhinitis, unspecified: Secondary | ICD-10-CM

## 2012-02-26 DIAGNOSIS — J4540 Moderate persistent asthma, uncomplicated: Secondary | ICD-10-CM

## 2012-02-26 DIAGNOSIS — J45909 Unspecified asthma, uncomplicated: Secondary | ICD-10-CM

## 2012-02-26 MED ORDER — MONTELUKAST SODIUM 10 MG PO TABS: 10.0000 mg | ORAL_TABLET | Freq: Every day | ORAL | Status: DC

## 2012-02-26 MED ORDER — BECLOMETHASONE DIPROPIONATE 40 MCG/ACT IN AERS: 2.0000 | INHALATION_SPRAY | Freq: Two times a day (BID) | RESPIRATORY_TRACT | Status: DC

## 2012-02-26 NOTE — Progress Notes (Signed)
  Subjective:    Patient ID: Anna Ramsey, female    DOB: 01-26-1966, 46 y.o.   MRN: [id number]  HPI Bronchitis - Tx with zpack and then levaquin and 2 rounds of steroid. Has been using inhaler multiple times a day. CXR was normal. Says she would get dizzy bc of her SOB. She thought she may have PNA. Before was sick would get SOB daily when would go outside to walk her dog, starting in the spring when her Allergies were aggrevated. She is feeling much better since she completed the course of steroids and Levaquin. On zyrtec.  Using some benadryl in AM and afternoon as well.  Feels better when inside.  She is out of her singulair right now.     Review of Systems     Objective:   Physical Exam  Constitutional: She is oriented to person, place, and time. She appears well-developed and well-nourished.  HENT:  Head: Normocephalic and atraumatic.  Cardiovascular: Normal rate, regular rhythm and normal heart sounds.   Pulmonary/Chest: Effort normal and breath sounds normal.  Neurological: She is alert and oriented to person, place, and time.  Skin: Skin is warm and dry.  Psychiatric: She has a normal mood and affect. Her behavior is normal.          Assessment & Plan:  Asthma- she is currently moderately persistent. Will start with Qvar 40 mg 2 puffs twice a day. Use albuterol as needed. Followup in 3-4 weeks. Then we'll adjust her and potentially add a long acting albuterol if needed. We will also restart her Singulair since her allergies are very big trigger for her. Call if she feels she's getting worse.  AR - Will restart the singulair. Continue his Zyrtec and Benadryl as needed.

## 2012-03-04 ENCOUNTER — Other Ambulatory Visit: Payer: Self-pay | Admitting: Physician Assistant

## 2012-03-04 ENCOUNTER — Other Ambulatory Visit: Payer: Self-pay | Admitting: *Deleted

## 2012-03-04 MED ORDER — BECLOMETHASONE DIPROPIONATE 40 MCG/ACT IN AERS: 2.0000 | INHALATION_SPRAY | Freq: Two times a day (BID) | RESPIRATORY_TRACT | Status: DC

## 2012-03-07 ENCOUNTER — Other Ambulatory Visit: Payer: Self-pay | Admitting: *Deleted

## 2012-03-07 MED ORDER — TRAZODONE HCL 50 MG PO TABS: 50.0000 mg | ORAL_TABLET | Freq: Every day | ORAL | Status: DC

## 2012-03-26 ENCOUNTER — Telehealth: Payer: Self-pay | Admitting: Family Medicine

## 2012-03-26 NOTE — Telephone Encounter (Signed)
Call pt: Got letter from her insurance recommending better compliance with metoprolol. Encourage her to take daily.

## 2012-03-27 NOTE — Telephone Encounter (Signed)
Left message on vm

## 2012-03-29 ENCOUNTER — Ambulatory Visit: Payer: BC Managed Care – PPO | Admitting: Family Medicine

## 2012-04-09 ENCOUNTER — Other Ambulatory Visit: Payer: Self-pay | Admitting: Family Medicine

## 2012-04-11 ENCOUNTER — Ambulatory Visit (INDEPENDENT_AMBULATORY_CARE_PROVIDER_SITE_OTHER): Payer: BC Managed Care – PPO | Admitting: Family Medicine

## 2012-04-11 ENCOUNTER — Encounter: Payer: Self-pay | Admitting: Family Medicine

## 2012-04-11 VITALS — BP 120/77 | HR 71 | Ht 62.0 in | Wt 146.0 lb

## 2012-04-11 DIAGNOSIS — J309 Allergic rhinitis, unspecified: Secondary | ICD-10-CM

## 2012-04-11 DIAGNOSIS — J45909 Unspecified asthma, uncomplicated: Secondary | ICD-10-CM

## 2012-04-11 MED ORDER — BUDESONIDE-FORMOTEROL FUMARATE 160-4.5 MCG/ACT IN AERO: 2.0000 | INHALATION_SPRAY | Freq: Two times a day (BID) | RESPIRATORY_TRACT | Status: DC

## 2012-04-11 NOTE — Progress Notes (Signed)
  Subjective:    Patient ID: Anna Ramsey, female    DOB: [birth year], 46 y.o.   MRN: [id number]  HPI Asthma - Says still having some AM sxs with itching and morning wheezing.  Still using her albuterol a couple of times a day.Says her singulair isn't really helping. Says her allergies and high humidity are triggers.  TAking zyrtec as well.     Review of Systems     Objective:   Physical Exam  Constitutional: She is oriented to person, place, and time. She appears well-developed and well-nourished.  HENT:  Head: Normocephalic and atraumatic.  Cardiovascular: Normal rate, regular rhythm and normal heart sounds.   Pulmonary/Chest: Effort normal. She has wheezes.       Fine end expiratory wheeze at the left lung base   Neurological: She is alert and oriented to person, place, and time.  Skin: Skin is warm and dry.  Psychiatric: She has a normal mood and affect. Her behavior is normal.          Assessment & Plan:  Asthma - Moderate Persistant. Add a long acting albuterol to inhaled CS. Inc IC dose.  Can continue singulair since just ffilled it. Continue zyrtec. Told if if still having to use her rescue inhaler daily in 2 weeks then go ahead and call so we can adjust her regimen or consider steroids.  F/U in 2 months.  Pt agrees to call.

## 2012-04-11 NOTE — Patient Instructions (Signed)
Can see if Dulera, Symbicort, or Advair is cheaper.

## 2012-04-16 ENCOUNTER — Telehealth: Payer: Self-pay | Admitting: *Deleted

## 2012-04-16 NOTE — Telephone Encounter (Signed)
Pt calls and states that the Symbicort is working great however it is 200.00 even with the coupon card so has ordered it through a [facility] pharmacy and they will be faxing you for you to sign it.

## 2012-04-16 NOTE — Telephone Encounter (Signed)
Ok, sounds good, I will keep an eye out.

## 2012-05-05 ENCOUNTER — Other Ambulatory Visit: Payer: Self-pay | Admitting: Family Medicine

## 2012-05-06 ENCOUNTER — Other Ambulatory Visit: Payer: Self-pay | Admitting: *Deleted

## 2012-05-06 MED ORDER — METOPROLOL TARTRATE 100 MG PO TABS: 100.0000 mg | ORAL_TABLET | Freq: Two times a day (BID) | ORAL | Status: DC

## 2012-05-09 ENCOUNTER — Other Ambulatory Visit: Payer: Self-pay | Admitting: Family Medicine

## 2012-06-10 ENCOUNTER — Telehealth: Payer: Self-pay | Admitting: Physician Assistant

## 2012-06-10 NOTE — Telephone Encounter (Signed)
Call patient: Let her know that due to prescription refills records it appears that you are running out of your beta blocker Lopressor. We just want to remind you that can be very dangerous. Please stay on lopressor and not run out.

## 2012-06-10 NOTE — Telephone Encounter (Signed)
LMOM informing Pt  

## 2012-06-11 ENCOUNTER — Other Ambulatory Visit: Payer: Self-pay | Admitting: Family Medicine

## 2012-06-17 ENCOUNTER — Encounter: Payer: Self-pay | Admitting: Family Medicine

## 2012-06-17 ENCOUNTER — Ambulatory Visit (INDEPENDENT_AMBULATORY_CARE_PROVIDER_SITE_OTHER): Payer: BC Managed Care – PPO | Admitting: Family Medicine

## 2012-06-17 VITALS — BP 142/93 | HR 68 | Wt 138.0 lb

## 2012-06-17 DIAGNOSIS — Z23 Encounter for immunization: Secondary | ICD-10-CM

## 2012-06-17 DIAGNOSIS — J45909 Unspecified asthma, uncomplicated: Secondary | ICD-10-CM

## 2012-06-17 NOTE — Progress Notes (Signed)
  Subjective:    Patient ID: Anna Ramsey, female    DOB: 1966-06-03, 46 y.o.   MRN: [id number]  HPI Doing well on the symbicor and hasn't had to use her rescue inhaler in the last month. Off her singulair.  She id down to one puff BID on the symbicort. No nighttime symptoms. She says she wonders if it's when she was on typical air since it is now fall but otherwise has not had to use her rescue inhaler at all.   Review of Systems     Objective:   Physical Exam  Constitutional: She is oriented to person, place, and time. She appears well-developed and well-nourished.  HENT:  Head: Normocephalic and atraumatic.  Cardiovascular: Normal rate, regular rhythm and normal heart sounds.   Pulmonary/Chest: Effort normal and breath sounds normal.  Neurological: She is alert and oriented to person, place, and time.  Skin: Skin is warm and dry.  Psychiatric: She has a normal mood and affect. Her behavior is normal.          Assessment & Plan:  ASthma - Continue the symbicort.  She's doing very well on current regimen. She said she's down to one puff twice in situ this twice a day. Anchor sure if she continues to do well she might want to try to drop down to once a week and to consider switching her back to just the plain Qvar.  Flu shot given today.

## 2012-08-12 ENCOUNTER — Other Ambulatory Visit: Payer: Self-pay | Admitting: Family Medicine

## 2012-10-09 ENCOUNTER — Other Ambulatory Visit: Payer: Self-pay | Admitting: Family Medicine

## 2012-10-14 ENCOUNTER — Other Ambulatory Visit: Payer: Self-pay | Admitting: Family Medicine

## 2012-11-21 ENCOUNTER — Other Ambulatory Visit: Payer: Self-pay | Admitting: Family Medicine

## 2012-12-28 ENCOUNTER — Other Ambulatory Visit: Payer: Self-pay | Admitting: Family Medicine

## 2013-01-02 ENCOUNTER — Telehealth: Payer: Self-pay | Admitting: *Deleted

## 2013-01-02 NOTE — Telephone Encounter (Signed)
Ok for exact pill til appt. Or she can come in to see me or hommel or Dr.t tomorrow.

## 2013-01-02 NOTE — Telephone Encounter (Signed)
Pt notified and will schedule appt with you for tomorrow. Barry Dienes, LPN

## 2013-01-02 NOTE — Telephone Encounter (Signed)
Pt calls and her trazadone was denied because she needs appointment. Dr. Linford Arnold isn't back until next week and she wants to know if she can get enough pills until appointment. Please advise if this is ok. Barry Dienes, LPN

## 2013-01-06 ENCOUNTER — Ambulatory Visit (INDEPENDENT_AMBULATORY_CARE_PROVIDER_SITE_OTHER): Payer: PRIVATE HEALTH INSURANCE | Admitting: Physician Assistant

## 2013-01-06 ENCOUNTER — Encounter: Payer: Self-pay | Admitting: Physician Assistant

## 2013-01-06 VITALS — BP 138/88 | HR 62 | Wt 129.0 lb

## 2013-01-06 DIAGNOSIS — F411 Generalized anxiety disorder: Secondary | ICD-10-CM

## 2013-01-06 DIAGNOSIS — Z131 Encounter for screening for diabetes mellitus: Secondary | ICD-10-CM

## 2013-01-06 DIAGNOSIS — I10 Essential (primary) hypertension: Secondary | ICD-10-CM

## 2013-01-06 DIAGNOSIS — G4700 Insomnia, unspecified: Secondary | ICD-10-CM

## 2013-01-06 DIAGNOSIS — J45909 Unspecified asthma, uncomplicated: Secondary | ICD-10-CM

## 2013-01-06 DIAGNOSIS — Z13220 Encounter for screening for lipoid disorders: Secondary | ICD-10-CM

## 2013-01-06 LAB — COMPREHENSIVE METABOLIC PANEL
CO2: 28 mEq/L (ref 19–32)
Calcium: 9.5 mg/dL (ref 8.4–10.5)
Chloride: 103 mEq/L (ref 96–112)
Creat: 0.73 mg/dL (ref 0.50–1.10)
Glucose, Bld: 90 mg/dL (ref 70–99)
Total Bilirubin: 0.7 mg/dL (ref 0.3–1.2)
Total Protein: 7.4 g/dL (ref 6.0–8.3)

## 2013-01-06 LAB — LIPID PANEL
Cholesterol: 172 mg/dL (ref 0–200)
Total CHOL/HDL Ratio: 2.9 Ratio
Triglycerides: 68 mg/dL (ref <150)
VLDL: 14 mg/dL (ref 0–40)

## 2013-01-06 MED ORDER — PAROXETINE HCL 10 MG PO TABS: 10.0000 mg | ORAL_TABLET | ORAL | Status: DC

## 2013-01-06 MED ORDER — METOPROLOL TARTRATE 100 MG PO TABS: 100.0000 mg | ORAL_TABLET | Freq: Two times a day (BID) | ORAL | Status: DC

## 2013-01-06 MED ORDER — TRAZODONE HCL 50 MG PO TABS: 50.0000 mg | ORAL_TABLET | Freq: Every day | ORAL | Status: DC

## 2013-01-06 MED ORDER — BUDESONIDE-FORMOTEROL FUMARATE 160-4.5 MCG/ACT IN AERO: 2.0000 | INHALATION_SPRAY | Freq: Two times a day (BID) | RESPIRATORY_TRACT | Status: DC

## 2013-01-06 NOTE — Progress Notes (Signed)
  Subjective:    Patient ID: Anna Ramsey, female    DOB: Mar 08, 1966, 47 y.o.   MRN: [id number]  HPI Patient presents to the clinic today to get medication refilled.   HTN- BP up today. Tooth filled this am with minor complications. She is in some pain. Taking medications and very controlled. Denies any CP, palpitations, SoB.   Insomnia- well controlled on trazodone. Needs refilled.   GAD- Wellcontrolled on Paxil. Needs refill.   Allergies/asthma- will controlled on symbicort, singulair, zyrtec and as needed albuterol inhaler. Rarely using inhaler. Needs refill.     Review of Systems     Objective:   Physical Exam  Constitutional: She is oriented to person, place, and time. She appears well-developed and well-nourished.  HENT:  Head: Normocephalic and atraumatic.  Right Ear: External ear normal.  Left Ear: External ear normal.  Mouth/Throat: Oropharynx is clear and moist.  Eyes: Conjunctivae are normal.  Neck: Normal range of motion. Neck supple. No thyromegaly present.  Cardiovascular: Normal rate, regular rhythm and normal heart sounds.   Pulmonary/Chest: Effort normal and breath sounds normal.  Lymphadenopathy:    She has no cervical adenopathy.  Neurological: She is alert and oriented to person, place, and time. No cranial nerve deficit.  Skin: Skin is warm and dry.  Psychiatric: She has a normal mood and affect. Her behavior is normal.          Assessment & Plan:  HTN- REfilled metoprolol.   Insomnia- Refilled TRazodone.   GAD- refilled paxil.   Allergies/asthma- refilled symbicort/singulair/albuterol.  Needs fasting labs and were sent. Needs CPE.

## 2013-01-20 ENCOUNTER — Other Ambulatory Visit: Payer: Self-pay | Admitting: *Deleted

## 2013-01-20 MED ORDER — FLUTICASONE FUROATE 27.5 MCG/SPRAY NA SUSP: 2.0000 | Freq: Every day | NASAL | Status: DC

## 2013-03-17 ENCOUNTER — Telehealth: Payer: Self-pay | Admitting: *Deleted

## 2013-03-17 NOTE — Telephone Encounter (Signed)
Pt called  & left vm stating that she is having an allergic reaction to canteloupe.  She stated that she is having diarrhea & vomiting.  She wants to know if she should take a benadryl or something else?

## 2013-03-17 NOTE — Telephone Encounter (Signed)
Yest please take a benadryl and can repeat in 24 hours.

## 2013-03-18 ENCOUNTER — Telehealth: Payer: Self-pay | Admitting: *Deleted

## 2013-03-18 MED ORDER — ONDANSETRON 4 MG PO TBDP: 4.0000 mg | ORAL_TABLET | Freq: Three times a day (TID) | ORAL | Status: DC | PRN

## 2013-03-18 NOTE — Telephone Encounter (Signed)
rx sent for zofran

## 2013-03-18 NOTE — Telephone Encounter (Signed)
LMOM that med sent to her pharmacy. Barry Dienes, LPN

## 2013-03-18 NOTE — Telephone Encounter (Signed)
Pt calls office back and wants to know if you would send in Zofran for her to keep with her and take when this happens and hopefully she can get it under control before it gets bad. She said it happened again yesterday and had to leave work

## 2013-03-18 NOTE — Telephone Encounter (Signed)
Left detailed message on machine.

## 2013-05-09 ENCOUNTER — Other Ambulatory Visit: Payer: Self-pay | Admitting: *Deleted

## 2013-05-09 MED ORDER — METOPROLOL TARTRATE 100 MG PO TABS: 100.0000 mg | ORAL_TABLET | Freq: Two times a day (BID) | ORAL | Status: DC

## 2013-07-10 ENCOUNTER — Other Ambulatory Visit: Payer: Self-pay | Admitting: *Deleted

## 2013-07-10 MED ORDER — ALBUTEROL SULFATE HFA 108 (90 BASE) MCG/ACT IN AERS: INHALATION_SPRAY | RESPIRATORY_TRACT | Status: DC

## 2013-07-19 ENCOUNTER — Other Ambulatory Visit: Payer: Self-pay | Admitting: Physician Assistant

## 2013-08-14 ENCOUNTER — Other Ambulatory Visit (HOSPITAL_COMMUNITY): Payer: Self-pay | Admitting: Obstetrics and Gynecology

## 2013-08-14 DIAGNOSIS — C539 Malignant neoplasm of cervix uteri, unspecified: Secondary | ICD-10-CM

## 2013-08-19 ENCOUNTER — Other Ambulatory Visit (HOSPITAL_COMMUNITY): Payer: Self-pay | Admitting: Obstetrics and Gynecology

## 2013-08-19 ENCOUNTER — Inpatient Hospital Stay (HOSPITAL_COMMUNITY): Admission: RE | Admit: 2013-08-19 | Payer: PRIVATE HEALTH INSURANCE | Source: Ambulatory Visit

## 2013-08-19 ENCOUNTER — Ambulatory Visit (HOSPITAL_COMMUNITY)
Admission: RE | Admit: 2013-08-19 | Discharge: 2013-08-19 | Disposition: A | Discharge status: Home / Self Care | Payer: PRIVATE HEALTH INSURANCE | Source: Ambulatory Visit | Attending: Obstetrics and Gynecology | Admitting: Obstetrics and Gynecology

## 2013-08-19 ENCOUNTER — Ambulatory Visit: Payer: PRIVATE HEALTH INSURANCE | Admitting: Gynecologic Oncology

## 2013-08-19 DIAGNOSIS — C539 Malignant neoplasm of cervix uteri, unspecified: Secondary | ICD-10-CM

## 2013-08-19 DIAGNOSIS — N83209 Unspecified ovarian cyst, unspecified side: Secondary | ICD-10-CM | POA: Insufficient documentation

## 2013-08-19 MED ORDER — IOHEXOL 300 MG/ML  SOLN
100.0000 mL | Freq: Once | INTRAMUSCULAR | Status: AC | PRN
  Administered 2013-08-19: 100 mL via INTRAVENOUS

## 2013-08-20 ENCOUNTER — Other Ambulatory Visit: Payer: Self-pay | Admitting: Family Medicine

## 2013-08-25 ENCOUNTER — Ambulatory Visit (HOSPITAL_COMMUNITY): Payer: PRIVATE HEALTH INSURANCE

## 2013-08-26 ENCOUNTER — Encounter: Payer: Self-pay | Admitting: Gynecology

## 2013-08-26 ENCOUNTER — Encounter (INDEPENDENT_AMBULATORY_CARE_PROVIDER_SITE_OTHER): Payer: Self-pay

## 2013-08-26 ENCOUNTER — Ambulatory Visit: Payer: PRIVATE HEALTH INSURANCE | Attending: Gynecology | Admitting: Gynecology

## 2013-08-26 VITALS — BP 125/89 | HR 74 | Temp 98.0°F | Resp 16 | Ht 61.69 in | Wt 146.8 lb

## 2013-08-26 DIAGNOSIS — C801 Malignant (primary) neoplasm, unspecified: Secondary | ICD-10-CM

## 2013-08-26 DIAGNOSIS — C539 Malignant neoplasm of cervix uteri, unspecified: Secondary | ICD-10-CM | POA: Insufficient documentation

## 2013-08-26 DIAGNOSIS — N83209 Unspecified ovarian cyst, unspecified side: Secondary | ICD-10-CM | POA: Insufficient documentation

## 2013-08-26 NOTE — Progress Notes (Signed)
Consult Note: Gyn-Onc   Anna Ramsey 47 y.o. female  Chief Complaint  Patient presents with  . Adenocarcinoma    New Consult    Assessment :Stage IB2 adenocarcinoma of the cervix.   Plan:  Management options were discussed in detail with the patient and her friend.  They are aware that either surgery or radiation therapy have similar outcomes regarding survival.  They are informed of the risks and complications of both treatment modalities.  Further they are aware that should some unfavorable prognostic factors be identified at surgery, she may need postop radiation.   After answering all questions, the patient wishes to pursue surgery.  Because of a very high deductible on her insurance [phone]) she wants to defer surgery until January (when her deductible will be only $500).  Surgery is scheduled at [facility] on January 13 (my first available OR day).  She will have preop work up at [facility] and pre-care visit on January 2.  Risks of radical hysterectomy/pelvic lymphadenectomy are reviewed in detail and include hemorrhage, infection, VTE, injury to adjacent viscera  (including fistulae) and anesthetic risks.  Further she is aware of bladder dysfunction after this surgery and the need for a suprapubic catheter which may remain in place for several weeks.  I am also concerned about a possible DVT and will obtain a doppler study asap.  HPI:  46yo WMF seen in consultation at the request of Dr. Malva Limes regarding management of a newly diagnosed adenocarcinoma of the cervix.   The patient has a remote history of CIN treated with cryotherapy as well as a LEEP procedure.  .  Since March, she has had a clear vaginal discharge that has become more profuse.    In June she had an AGUS pap smear.  Colposcopy in July was negative as was an ECC (path reviewed) An ultrasound in November 2014 shows a thickening.  A repeat ECC produced abundant tissue which was an adenocarcinoma.  A CT scan shows no  adenopathy but an enlarged cervix mass measuring 5.1x4.4x5.2 cm.  There was also a simple 5 cm ovarian cyst. She denies any constitutional symptoms, weight loss or pain  However, she does say that for the last week she has had pain in her right anterior thigh.  Sometimes so severe that she cannot walk.  She denies edema, chest pain or SOB.    Review of Systems:10 point review of systems is negative except as noted in interval history.   Vitals: Blood pressure 125/89, pulse 74, temperature 98 F (36.7 C), temperature source Oral, resp. rate 16, height 5' 1.69" (1.567 m), weight 146 lb 12.8 oz (66.588 kg), last menstrual period 08/19/2013.  Physical Exam: General : The patient is a healthy woman in no acute distress.  HEENT: normocephalic, extraoccular movements normal; neck is supple without thyromegally  Lynphnodes: Supraclavicular and inguinal nodes not enlarged  Abdomen: Soft, non-tender, no ascites, no organomegally, no masses, no hernias  Pelvic:  EGBUS: Normal female  Vagina: Normal, no lesions  Urethra and Bladder: Normal, non-tender  Cervix: The exocervix appears normal.   However on bimanual and RV exam the endocervix is expanded to approximately 5 cm.  I do not feel any parametrial extension. Uterus: normal shape and size. Bi-manual examination: Non-tender; no adenxal masses or nodularity  Rectal: normal sphincter tone, no masses, no blood  Lower extremities: The right thigh appears to be slightly tighter than the left.  There is no pain to palpation or compression.  Homan's sign  is negative.   Normal range of motion      Allergies  Allergen Reactions  . Penicillins   . Sulfonamide Derivatives     Past Medical History  Diagnosis Date  . Asthma     hx in childhood  . Hypertension   . Seasonal allergies   . Cervical cancer     Past Surgical History  Procedure Laterality Date  . Foot surgery      Current Outpatient Prescriptions  Medication Sig Dispense Refill   . budesonide-formoterol (SYMBICORT) 160-4.5 MCG/ACT inhaler Inhale 2 puffs into the lungs 2 (two) times daily.  1 Inhaler  5  . cetirizine (ZYRTEC) 10 MG tablet Take 10 mg by mouth at bedtime.        . metoprolol (LOPRESSOR) 100 MG tablet Take 1 tablet (100 mg total) by mouth 2 (two) times daily.  60 tablet  5  . ondansetron (ZOFRAN-ODT) 4 MG disintegrating tablet Take 1 tablet (4 mg total) by mouth every 8 (eight) hours as needed for nausea.  20 tablet  0  . PARoxetine (PAXIL) 10 MG tablet Take 1 tablet (10 mg total) by mouth every morning.  90 tablet  1  . traZODone (DESYREL) 50 MG tablet TAKE ONE TABLET BY MOUTH AT BEDTIME  30 tablet  0  . albuterol (VENTOLIN HFA) 108 (90 BASE) MCG/ACT inhaler INHALE TWO TO FOUR PUFFS BY MOUTH EVERY 6 HOURS AS NEEDED  18 g  1  . fluticasone (VERAMYST) 27.5 MCG/SPRAY nasal spray Place 2 sprays into the nose daily.  10 g  12  . montelukast (SINGULAIR) 10 MG tablet Take 10 mg by mouth daily.         No current facility-administered medications for this visit.    History   Social History  . Marital Status: Married    Spouse Name: N/A    Number of Children: N/A  . Years of Education: N/A   Occupational History  . Not on file.   Social History Main Topics  . Smoking status: Former Smoker    Quit date: 09/25/2004  . Smokeless tobacco: Not on file  . Alcohol Use: 0.5 oz/week    1 drink(s) per week  . Drug Use: No  . Sexual Activity: Yes   Other Topics Concern  . Not on file   Social History Narrative  . No narrative on file    Family History  Problem Relation Age of Onset  . Heart attack Father 82  . Diabetes Father   . Hypertension Maternal Grandmother   . Stroke Maternal Grandmother   . Cancer Mother     breast/thyroid      Jeannette Corpus, MD 08/26/2013, 3:13 PM

## 2013-08-26 NOTE — Patient Instructions (Signed)
Preoperative visit at [facility] on January 2 @ 1030 Surgery at [facility] on January 13. Will schedule a doppler study of the leg asap.

## 2013-08-27 ENCOUNTER — Ambulatory Visit (HOSPITAL_COMMUNITY)
Admission: RE | Admit: 2013-08-27 | Discharge: 2013-08-27 | Disposition: A | Discharge status: Home / Self Care | Payer: PRIVATE HEALTH INSURANCE | Source: Ambulatory Visit | Attending: Gynecology | Admitting: Gynecology

## 2013-08-27 DIAGNOSIS — C539 Malignant neoplasm of cervix uteri, unspecified: Secondary | ICD-10-CM

## 2013-08-27 DIAGNOSIS — M79609 Pain in unspecified limb: Secondary | ICD-10-CM

## 2013-08-27 DIAGNOSIS — C801 Malignant (primary) neoplasm, unspecified: Secondary | ICD-10-CM

## 2013-08-27 NOTE — Progress Notes (Signed)
VASCULAR LAB PRELIMINARY  PRELIMINARY  PRELIMINARY  PRELIMINARY  Right lower extremity venous duplex completed.    Preliminary report:  Right:  No evidence of DVT, superficial thrombosis, or Baker's cyst.  Katricia Prehn, RVT 08/27/2013, 12:58 PM

## 2013-08-28 ENCOUNTER — Ambulatory Visit (HOSPITAL_COMMUNITY)
Admission: RE | Admit: 2013-08-28 | Payer: PRIVATE HEALTH INSURANCE | Source: Ambulatory Visit | Admitting: Obstetrics and Gynecology

## 2013-08-28 ENCOUNTER — Encounter (HOSPITAL_COMMUNITY): Admission: RE | Payer: Self-pay | Source: Ambulatory Visit

## 2013-08-28 SURGERY — LIGATION, FALLOPIAN TUBE, LAPAROSCOPIC
Anesthesia: Choice | Laterality: Bilateral

## 2013-08-28 NOTE — Progress Notes (Signed)
The patient is now scheduled for surgery at [facility] on September 05, 2013.  She will come over this next Monday for a preop workup. 

## 2013-09-01 DIAGNOSIS — C539 Malignant neoplasm of cervix uteri, unspecified: Secondary | ICD-10-CM | POA: Insufficient documentation

## 2013-09-05 HISTORY — PX: RADICAL ABDOMINAL HYSTERECTOMY: SUR659

## 2013-09-08 ENCOUNTER — Encounter: Payer: Self-pay | Admitting: Family Medicine

## 2013-09-12 ENCOUNTER — Ambulatory Visit: Payer: PRIVATE HEALTH INSURANCE | Attending: Gynecologic Oncology | Admitting: Gynecologic Oncology

## 2013-09-12 ENCOUNTER — Telehealth: Payer: Self-pay | Admitting: Gynecologic Oncology

## 2013-09-12 ENCOUNTER — Ambulatory Visit: Payer: PRIVATE HEALTH INSURANCE

## 2013-09-12 VITALS — BP 131/85 | HR 74 | Temp 98.5°F | Resp 16 | Ht 61.61 in | Wt 145.0 lb

## 2013-09-12 DIAGNOSIS — C539 Malignant neoplasm of cervix uteri, unspecified: Secondary | ICD-10-CM

## 2013-09-12 DIAGNOSIS — R5383 Other fatigue: Secondary | ICD-10-CM

## 2013-09-12 LAB — CBC WITH DIFFERENTIAL/PLATELET
Basophils Absolute: 0 10*3/uL (ref 0.0–0.1)
EOS%: 5.9 % (ref 0.0–7.0)
Eosinophils Absolute: 0.5 10*3/uL (ref 0.0–0.5)
HCT: 28.9 % — ABNORMAL LOW (ref 34.8–46.6)
HGB: 9.7 g/dL — ABNORMAL LOW (ref 11.6–15.9)
MCH: 32.4 pg (ref 25.1–34.0)
MONO#: 0.7 10*3/uL (ref 0.1–0.9)
NEUT#: 5.5 10*3/uL (ref 1.5–6.5)
NEUT%: 68.5 % (ref 38.4–76.8)
Platelets: 354 10*3/uL (ref 145–400)
RDW: 13.2 % (ref 11.2–14.5)
WBC: 8 10*3/uL (ref 3.9–10.3)
lymph#: 1.3 10*3/uL (ref 0.9–3.3)

## 2013-09-12 NOTE — Progress Notes (Signed)
Follow Up Note: Gyn-Onc  Anna Ramsey 47 y.o. female  CC:  Chief Complaint  Patient presents with  . Follow-up    [facility]    HPI:  Anna Ramsey is a 47 year old female seen in consultation at the request of Dr. Malva Limes regarding management of a newly diagnosed adenocarcinoma of the cervix. The patient has a remote history of CIN treated with cryotherapy as well as a LEEP procedure. . Since March, she has had a clear vaginal discharge that has become more profuse.  In June, she had an AGUS pap smear. Colposcopy in July was negative as was an ECC (path reviewed).  An ultrasound in November 2014 shows a thickening. A repeat ECC produced abundant tissue which was an adenocarcinoma. A CT scan shows no adenopathy but an enlarged cervix mass measuring 5.1x4.4x5.2 cm. There was also a simple 5 cm ovarian cyst.  On 09/05/13, she underwent a radical abdominal hysterectomy, bilateral salpingo-oophorectomy, pelvic lymphadenectomy, and placement of a suprapubic catheter by Dr. De Blanch at [facility].  Her post-operative course was uneventful and she was discharged on POD #3 with lovenox post-op.  Her final pathology revealed well differentiated invasive endocervical adenocarcinoma, tumor size 5 cm, depth of invasion 1.85cm with total wall thickness of 2.0cm with lymphovascular space invasion being present.  Parametrial soft tissue invasion not identified with four parametrial nodes negative for malignancy.  Surgical margins were clear with the invasive component coming within 2 mm of deep anterior soft tissue margin.  29 regional lymph nodes were sampled with 0 nodes involved.  Overall stage 1B2 cervical adenocarcinoma.   Interval History:  She presents today for post-operative follow up and for suprapubic bladder training.  She reports feeling moderately fatigued since surgery and requests that her labs be checked today "since they were low at [facility]."  She is ambulating without difficulty  but fatigues quickly.  Tolerating diet with no nausea or emesis reported.  Intermittent dizziness reported with movement.  Adequate PO fluid intake reported.  Bowels functioning without difficulty.  Urine clear and draining from suprapubic without difficulty.  No vaginal or rectal bleeding reported.  See below for detailed ROS.     Review of Systems  Constitutional: Feels fatigued.  No fever, chills, early satiety, change in appetite, unintentional weight loss or gain, change in vision or hearing.  Positive for intermittent dizziness.    Cardiovascular: No chest pain, shortness of breath, or edema.  Pulmonary: No cough or wheeze.  Gastrointestinal: No nausea, vomiting, or diarrhea. No bright red blood per rectum or change in bowel movement.  Genitourinary: No frequency, urgency, or dysuria. No vaginal bleeding or discharge.  Musculoskeletal: No myalgia or joint pain. Neurologic: No weakness, numbness, or change in gait.  Psychology: No depression, anxiety, or insomnia.  Current Meds:  Outpatient Encounter Prescriptions as of 09/12/2013  Medication Sig  . albuterol (VENTOLIN HFA) 108 (90 BASE) MCG/ACT inhaler INHALE TWO TO FOUR PUFFS BY MOUTH EVERY 6 HOURS AS NEEDED  . budesonide-formoterol (SYMBICORT) 160-4.5 MCG/ACT inhaler Inhale 2 puffs into the lungs 2 (two) times daily.  . budesonide-formoterol (SYMBICORT) 80-4.5 MCG/ACT inhaler Inhale into the lungs.  . cetirizine (ZYRTEC) 10 MG tablet Take 10 mg by mouth at bedtime.    . docusate sodium (STOOL SOFTENER) 100 MG capsule Take 100 mg by mouth.  . enoxaparin (LOVENOX) 40 MG/0.4ML injection Inject 40 mg into the skin.  . fluticasone (VERAMYST) 27.5 MCG/SPRAY nasal spray Place 2 sprays into the nose daily.  [name]  HYDROmorphone (DILAUDID) 2 MG tablet Take 2 mg by mouth.  [name] ibuprofen (ADVIL,MOTRIN) 800 MG tablet Take 800 mg by mouth.  . magnesium oxide (MAG-OX) 400 MG tablet Take 400 mg by mouth.  . metoprolol (LOPRESSOR) 100 MG tablet Take 1  tablet (100 mg total) by mouth 2 (two) times daily.  . metoprolol (LOPRESSOR) 100 MG tablet Take 100 mg by mouth.  . montelukast (SINGULAIR) 10 MG tablet Take 10 mg by mouth daily.    . ondansetron (ZOFRAN-ODT) 4 MG disintegrating tablet Take 1 tablet (4 mg total) by mouth every 8 (eight) hours as needed for nausea.  [name] oxyCODONE-acetaminophen (PERCOCET) 5-325 MG per tablet Take by mouth.  [name] PARoxetine (PAXIL) 10 MG tablet Take 1 tablet (10 mg total) by mouth every morning.  [name] scopolamine (TRANSDERM-SCOP) 1.5 MG Apply 1.5 mg topically.  . traZODone (DESYREL) 50 MG tablet TAKE ONE TABLET BY MOUTH AT BEDTIME  . [DISCONTINUED] budesonide-formoterol (SYMBICORT) 80-4.5 MCG/ACT inhaler Inhale into the lungs.  . [DISCONTINUED] cetirizine (ZYRTEC) 10 MG tablet Take 10 mg by mouth.  . [DISCONTINUED] cetirizine (ZYRTEC) 10 MG tablet Take 10 mg by mouth.  . [DISCONTINUED] docusate sodium (STOOL SOFTENER) 100 MG capsule Take 100 mg by mouth.  . [DISCONTINUED] enoxaparin (LOVENOX) 40 MG/0.4ML injection Inject 40 mg into the skin.  . [DISCONTINUED] ibuprofen (ADVIL,MOTRIN) 800 MG tablet Take 800 mg by mouth.  . [DISCONTINUED] magnesium oxide (MAG-OX) 400 MG tablet Take 400 mg by mouth.  . [DISCONTINUED] metoprolol (LOPRESSOR) 100 MG tablet Take 100 mg by mouth.  . [DISCONTINUED] naloxone (NARCAN) 0.4 MG/ML injection Inject 0.4 mg into the vein.  . [DISCONTINUED] PARoxetine (PAXIL) 10 MG tablet Take 10 mg by mouth.  . [DISCONTINUED] PARoxetine (PAXIL) 20 MG tablet Take 10 mg by mouth.  . [DISCONTINUED] scopolamine (TRANSDERM-SCOP) 1.5 MG Place 1.5 mg onto the skin.  . [DISCONTINUED] traZODone (DESYREL) 50 MG tablet Take 50 mg by mouth.  . [DISCONTINUED] traZODone (DESYREL) 50 MG tablet Take 50 mg by mouth.    Allergy:  Allergies  Allergen Reactions  . Penicillins Other (See Comments)    unknown  . Sulfa Antibiotics Other (See Comments)    Numbness in lower extremeties  . Sulfonamide Derivatives      Social Hx:   History   Social History  . Marital Status: Married    Spouse Name: N/A    Number of Children: N/A  . Years of Education: N/A   Occupational History  . Not on file.   Social History Main Topics  . Smoking status: Former Smoker    Quit date: 09/25/2004  . Smokeless tobacco: Not on file  . Alcohol Use: 0.5 oz/week    1 drink(s) per week  . Drug Use: No  . Sexual Activity: Yes   Other Topics Concern  . Not on file   Social History Narrative  . No narrative on file    Past Surgical Hx:  Past Surgical History  Procedure Laterality Date  . Foot surgery    . Radical abdominal hysterectomy  12/13/114  . Bilateral salpingoophorectomy  09/06/13    Past Medical Hx:  Past Medical History  Diagnosis Date  . Asthma     hx in childhood  . Hypertension   . Seasonal allergies   . Cervical cancer     Family Hx:  Family History  Problem Relation Age of Onset  . Heart attack Father 25  . Diabetes Father   . Hypertension Maternal Grandmother   .  Stroke Maternal Grandmother   . Cancer Mother     breast/thyroid    Vitals:  Blood pressure 131/85, pulse 74, temperature 98.5 F (36.9 C), resp. rate 16, height 5' 1.61" (1.565 m), weight 145 lb (65.772 kg), last menstrual period 08/19/2013, SpO2 100.00%.  Physical Exam:  General: Well developed, well nourished female in no acute distress. Alert and oriented x 3.  Neck: Supple without any enlargements.  Lymph node survey: No cervical, supraclavicular, or inguinal adenopathy.  Cardiovascular: Regular rate and rhythm. S1 and S2 normal.  Lungs: Clear to auscultation bilaterally. No wheezes/crackles/rhonchi noted.  Skin: No rashes or lesions present. Back: No CVA tenderness.  Abdomen: Abdomen soft, non-tender and non-obese. Active bowel sounds in all quadrants. No evidence of a fluid wave or abdominal masses.  Low transverse incision with steri strips clean, dry, and intact.  Dressing removed and clean, dry,  dressing placed.  Suprapubic insertion site with minimal drainage- sutures intact.  Extremities: No bilateral cyanosis, edema, or clubbing.   Assessment/Plan:  47 year old s/p radical abdominal hysterectomy, bilateral salpingo-oophorectomy, pelvic lymphadenectomy, and placement of a suprapubic catheter by Dr. De Blanch at [facility] on 09/05/13 for a Stage 1B2 cervical adenocarcinoma.  She is doing well post-operatively except for moderate fatigue.  Trial voiding with the suprapubic catheter taught to the patient and her mother with no concerns voiced.  Supplies given and incision care reinforced.  She is given written instructions as well along with a recording sheet.  She is to contact the office with an update in several days and when her voiding totals are significantly more than the totals from draining the suprapubic catheter.  We will check a CBC today to follow up on from her last Hgb at [facility] of 8.9 and Hct 26.9.  She is advised to begin taking Iron per Dr. Stanford Breed and states that she will get that over the counter.  Reportable signs and symptoms reviewed.  She is to call for any questions or concerns.  The patient was reviewed with Dr. Stanford Breed, who agrees with the above plan.     Amere Iott DEAL, NP 09/12/2013, 11:37 AM

## 2013-09-12 NOTE — Telephone Encounter (Signed)
Patient informed of lab results.  Instructed to call for any needs or concerns.

## 2013-09-12 NOTE — Patient Instructions (Signed)
Follow up with Dr. Roselind Messier in Radiation Oncology as scheduled.  Please call when the amounts that you are voiding on your own are significantly more than what you drain from the catheter.

## 2013-09-16 ENCOUNTER — Ambulatory Visit: Payer: PRIVATE HEALTH INSURANCE

## 2013-09-16 ENCOUNTER — Other Ambulatory Visit: Payer: Self-pay | Admitting: Gynecologic Oncology

## 2013-09-16 DIAGNOSIS — C539 Malignant neoplasm of cervix uteri, unspecified: Secondary | ICD-10-CM

## 2013-09-16 DIAGNOSIS — N390 Urinary tract infection, site not specified: Secondary | ICD-10-CM

## 2013-09-16 LAB — URINALYSIS, MICROSCOPIC - CHCC
Nitrite: POSITIVE
Protein: NEGATIVE mg/dL
Urobilinogen, UR: 0.2 mg/dL (ref 0.2–1)
pH: 6 (ref 4.6–8.0)

## 2013-09-16 MED ORDER — CIPROFLOXACIN HCL 250 MG PO TABS: 250.0000 mg | ORAL_TABLET | Freq: Two times a day (BID) | ORAL | Status: DC

## 2013-09-16 MED ORDER — OXYCODONE-ACETAMINOPHEN 5-325 MG PO TABS: 1.0000 | ORAL_TABLET | Freq: Four times a day (QID) | ORAL | Status: DC | PRN

## 2013-09-16 NOTE — Progress Notes (Signed)
Patient presented today with her sister with complaints of redness around the suprapubic insertion site and no urine drainage from the suprapubic catheter since 12 midnight.  Denies fever, chills, shortness of breath, or chest pain.  She continues to report fatigue, that has improved slightly.  Today is her birthday.  Mild erythema noted around the suprapubic site with no signs of infection.  Suprapubic catheter assessed and unplugged with 250 cc out of clear, yellow urine without difficulty.  Patient reporting strong smelling urine at times and lower back pain.  Will send urine for analysis and culture today.  Refill on percocet written for the patient who takes ibuprofen throughout the day and percocet at bedtime for moderate pain as needed.  We will contact her with the results of her urine sample from today.  She is to call for any questions or concerns.  She is advised to use neosporin around the suprapubic insertion site and to call for increased redness, development of warmth or drainage, fever, chills, nausea, vomiting, severe abd pain, change in bowel habits.

## 2013-09-16 NOTE — Progress Notes (Signed)
Pt informed of UA results.  Dr. Stanford Breed also notified.  Informed that Cipro would be called in since she is allergic to Sulfa.  Reportable signs and symptoms reviewed.  Advised to call for any needs.

## 2013-09-17 ENCOUNTER — Telehealth: Payer: Self-pay | Admitting: Gynecologic Oncology

## 2013-09-17 LAB — URINE CULTURE

## 2013-09-17 NOTE — Telephone Encounter (Signed)
Called patient to check on current status with suprapubic bladder training.  Reporting no difficulty with adequate amounts of urine coming out with voiding and when emptying the suprapubic.  She has started her abxs, cipro, without any issues.  No concerns voiced.  Instructed to call for any needs or concerns.

## 2013-09-22 ENCOUNTER — Encounter: Payer: Self-pay | Admitting: Gynecologic Oncology

## 2013-09-22 ENCOUNTER — Ambulatory Visit: Payer: PRIVATE HEALTH INSURANCE | Attending: Gynecologic Oncology | Admitting: Gynecologic Oncology

## 2013-09-22 VITALS — BP 150/100 | HR 75 | Temp 98.8°F | Resp 18 | Wt 141.5 lb

## 2013-09-22 DIAGNOSIS — C539 Malignant neoplasm of cervix uteri, unspecified: Secondary | ICD-10-CM

## 2013-09-22 NOTE — Progress Notes (Signed)
Follow Up Note: Gyn-Onc  Anna Ramsey 47 y.o. female  CC:  Chief Complaint  Patient presents with  . Follow-up    Suprapubic Catheter Removal    HPI:  Anna Ramsey is a 47 year old female seen in consultation at the request of Dr. Malva Limes regarding management of a newly diagnosed adenocarcinoma of the cervix. The patient has a remote history of CIN treated with cryotherapy as well as a LEEP procedure. . Since March, she has had a clear vaginal discharge that has become more profuse.  In June, she had an AGUS pap smear. Colposcopy in July was negative as was an ECC (path reviewed).  An ultrasound in November 2014 shows a thickening. A repeat ECC produced abundant tissue which was an adenocarcinoma. A CT scan shows no adenopathy but an enlarged cervix mass measuring 5.1x4.4x5.2 cm. There was also a simple 5 cm ovarian cyst.  On 09/05/13, she underwent a radical abdominal hysterectomy, bilateral salpingo-oophorectomy, pelvic lymphadenectomy, and placement of a suprapubic catheter by Dr. De Blanch at [facility].  Her post-operative course was uneventful and she was discharged on POD #3 with lovenox post-op.  Her final pathology revealed well differentiated invasive endocervical adenocarcinoma, tumor size 5 cm, depth of invasion 1.85cm with total wall thickness of 2.0cm with lymphovascular space invasion being present.  Parametrial soft tissue invasion not identified with four parametrial nodes negative for malignancy.  Surgical margins were clear with the invasive component coming within 2 mm of deep anterior soft tissue margin.  29 regional lymph nodes were sampled with 0 nodes involved.  Overall stage 1B2 cervical adenocarcinoma.   Interval History:  She presents today for suprapubic catheter removal.  She has been voiding adequate amounts with 25 to 50 cc left in the bladder with draining of the suprapubic catheter.  She reports mild improvement in the fatigue experienced  since surgery.  She is ambulating without difficulty but fatigues quickly and states being up one hour equals 3 hours in bed resting after.  Tolerating diet.  Reporting nausea this am with one episode of emesis.  Passing flatus but bowels moving "somewhat."  She is currently taking three stool softeners a day.  Adequate PO fluid intake reported.  Urine clear and draining from suprapubic without difficulty.  She has one day left on her antibiotics, Cipro, prescribed last week.  No vaginal or rectal bleeding reported.  See below for detailed ROS.     Review of Systems  Constitutional: Feels fatigued.  No fever, chills, early satiety, change in appetite, unintentional weight loss or gain, change in vision or hearing.  Improvement in intermittent dizziness.    Cardiovascular: No chest pain, shortness of breath, or edema.  Pulmonary: No cough or wheeze.  Gastrointestinal: Nausea this am with one episode of emesis.  No diarrhea. No bright red blood per rectum or change in bowel movement.  Mild constipation reported.  Genitourinary: No frequency, urgency, or dysuria. No vaginal bleeding or discharge.  Musculoskeletal: No myalgia or joint pain. Neurologic: No weakness, numbness, or change in gait.  Psychology: No depression, anxiety, or insomnia.  Current Meds:  Outpatient Encounter Prescriptions as of 09/22/2013  Medication Sig  . budesonide-formoterol (SYMBICORT) 160-4.5 MCG/ACT inhaler Inhale 2 puffs into the lungs 2 (two) times daily.  . ciprofloxacin (CIPRO) 250 MG tablet Take 1 tablet (250 mg total) by mouth 2 (two) times daily.  [name] docusate sodium (STOOL SOFTENER) 100 MG capsule Take 100 mg by mouth.  . enoxaparin (LOVENOX) 40 MG/0.4ML  injection Inject 40 mg into the skin.  . fluticasone (VERAMYST) 27.5 MCG/SPRAY nasal spray Place 2 sprays into the nose daily.  . metoprolol (LOPRESSOR) 100 MG tablet Take 1 tablet (100 mg total) by mouth 2 (two) times daily.  . traZODone (DESYREL) 50 MG tablet TAKE  ONE TABLET BY MOUTH AT BEDTIME  . albuterol (VENTOLIN HFA) 108 (90 BASE) MCG/ACT inhaler INHALE TWO TO FOUR PUFFS BY MOUTH EVERY 6 HOURS AS NEEDED  . budesonide-formoterol (SYMBICORT) 80-4.5 MCG/ACT inhaler Inhale into the lungs.  . cetirizine (ZYRTEC) 10 MG tablet Take 10 mg by mouth at bedtime.    [name] ibuprofen (ADVIL,MOTRIN) 800 MG tablet Take 800 mg by mouth.  . magnesium oxide (MAG-OX) 400 MG tablet Take 400 mg by mouth.  . metoprolol (LOPRESSOR) 100 MG tablet Take 100 mg by mouth.  . montelukast (SINGULAIR) 10 MG tablet Take 10 mg by mouth daily.    . ondansetron (ZOFRAN-ODT) 4 MG disintegrating tablet Take 1 tablet (4 mg total) by mouth every 8 (eight) hours as needed for nausea.  [name] oxyCODONE-acetaminophen (PERCOCET) 5-325 MG per tablet Take 1-2 tablets by mouth every 6 (six) hours as needed for moderate pain or severe pain.  [name] PARoxetine (PAXIL) 10 MG tablet Take 1 tablet (10 mg total) by mouth every morning.  [name] scopolamine (TRANSDERM-SCOP) 1.5 MG Apply 1.5 mg topically.    Allergy:  Allergies  Allergen Reactions  . Penicillins Other (See Comments)    unknown  . Sulfa Antibiotics Other (See Comments)    Numbness in lower extremeties  . Sulfonamide Derivatives     Social Hx:   History   Social History  . Marital Status: Married    Spouse Name: N/A    Number of Children: N/A  . Years of Education: N/A   Occupational History  . Not on file.   Social History Main Topics  . Smoking status: Former Smoker    Quit date: 09/25/2004  . Smokeless tobacco: Not on file  . Alcohol Use: 0.5 oz/week    1 drink(s) per week  . Drug Use: No  . Sexual Activity: Yes   Other Topics Concern  . Not on file   Social History Narrative  . No narrative on file    Past Surgical Hx:  Past Surgical History  Procedure Laterality Date  . Foot surgery    . Radical abdominal hysterectomy  12/13/114  . Bilateral salpingoophorectomy  09/06/13    Past Medical Hx:  Past Medical History   Diagnosis Date  . Asthma     hx in childhood  . Hypertension   . Seasonal allergies   . Cervical cancer     Family Hx:  Family History  Problem Relation Age of Onset  . Heart attack Father 14  . Diabetes Father   . Hypertension Maternal Grandmother   . Stroke Maternal Grandmother   . Cancer Mother     breast/thyroid    Vitals:  Blood pressure 150/100, pulse 75, temperature 98.8 F (37.1 C), temperature source Oral, resp. rate 18, weight 141 lb 8 oz (64.184 kg), last menstrual period 08/19/2013.  CBC    Component Value Date/Time   WBC 8.0 09/12/2013 1132   WBC 10.4 09/02/2008 2257   RBC 2.99* 09/12/2013 1132   RBC 4.80 09/02/2008 2257   HGB 9.7* 09/12/2013 1132   HGB 15.2* 09/02/2008 2257   HCT 28.9* 09/12/2013 1132   HCT 46.2* 09/02/2008 2257   PLT 354 09/12/2013 1132   PLT 334  09/02/2008 2257   MCV 96.7 09/12/2013 1132   MCV 96.3 09/02/2008 2257   MCH 32.4 09/12/2013 1132   MCHC 33.6 09/12/2013 1132   MCHC 32.9 09/02/2008 2257   RDW 13.2 09/12/2013 1132   RDW 13.2 09/02/2008 2257   LYMPHSABS 1.3 09/12/2013 1132   MONOABS 0.7 09/12/2013 1132   EOSABS 0.5 09/12/2013 1132   BASOSABS 0.0 09/12/2013 1132   Urinalysis from 09/16/13: Positive nitrites, moderate leukocyte esterase, 21-50 WBC, moderate bacteria (placed on Cipro BID for 7 days per Dr. Stanford Breed). Urine culture from 09/16/13: no growth   Physical Exam:  General: Well developed, well nourished female in no acute distress. Alert and oriented x 3.  Cardiovascular: Regular rate and rhythm. S1 and S2 normal.  Lungs: Clear to auscultation bilaterally. No wheezes/crackles/rhonchi noted.  Skin: No rashes or lesions present. Back: No CVA tenderness.  Abdomen: Abdomen soft, non-tender and non-obese. Active bowel sounds in all quadrants. No evidence of a fluid wave or abdominal masses.  Low transverse incision with steri strips clean, dry, and intact- steris removed.  Dressing removed around suprapubic.  Suprapubic  insertion site with minimal drainage- sutures intact, mild erythema.  Suprapubic catheter removed without difficulty.  Dry dressing applied.  Extremities: No bilateral cyanosis, edema, or clubbing.   Assessment/Plan:  47 year old s/p radical abdominal hysterectomy, bilateral salpingo-oophorectomy, pelvic lymphadenectomy, and placement of a suprapubic catheter by Dr. De Blanch at [facility] on 09/05/13 for a Stage 1B2 cervical adenocarcinoma.  She is to monitor her output after suprapubic catheter removal and call the office for any concerns or questions.   Supplies given and incision care reinforced.  She is advised to continue taking Iron per Dr. Stanford Breed and to begin taking Miralax or Senna-kot for her bowels.  She is to complete her antibiotic course, with completion date tomorrow.  Reportable signs and symptoms reviewed.  She is to call for any questions or concerns.   Tabrina Esty DEAL, NP 09/22/2013, 1:35 PM

## 2013-09-22 NOTE — Patient Instructions (Signed)
Please call the office for any issues with urination.  Try Miralax or Sennakot for your bowels.  Please call for any questions or concerns.

## 2013-09-26 NOTE — Progress Notes (Signed)
GYN Location of Tumor / Histology: adenocarcinoma of the cervix  Patient presented with a clear vaginal discharge that has become more profuse since March, 2014.[name]   Biopsies  (if applicable) revealed: Histologic type: Invasive endocervical adenocarcinoma, usual type Histologic grade: well differentiated  Tumor focality/site: (quadrant(s) of cervix) circumferential  Tumor size: (greatest dimension) 5 cm  Extent of invasion: Depth: 1.85 cm Wall thickness: 2.0 cm Percent: 93% Horizontal extent: >7 mm  Lymphovascular space invasion: present  Parametrial soft tissue involvement:  - Not identified (Note: There are two bland glands abutting the parametrium in sections A12 and A13. These glands are presumed to be part of the tumor but they do not extend into the adipose tissue of the parametrium). - Four parametrial lymph nodes are noted without evidence of metastatic disease (0/4)  Vaginal cuff involvement: not identified   Past/Anticipated interventions by Gyn/Onc surgery, if any: RADICAL ABDOMINAL HYSTERECTOMY and BILATERAL SALPINGOOPHORECTOMY and placement of a suprapubic catheter    by Dr. Fermin Schwab on 09/06/13   Past/Anticipated interventions by medical oncology, if any: no  Weight changes, if any: has lost 7 lbs since 09/08/13  Bowel/Bladder complaints, if any: constipation  Nausea/Vomiting, if any: nausea, does not have an appetite since surgery, gets full quickly, has trouble eating in the mornings.  Pain issues, if any:  Aching in pelvis.  Taking ibuprofen 800 mg that she says does not work and 1 percocet per day.  SAFETY ISSUES:  Prior radiation? no  Pacemaker/ICD? no  Possible current pregnancy? no  Is the patient on methotrexate? no  Current Complaints / other details:  Has 1 daughter.  Here with her mother.

## 2013-09-29 ENCOUNTER — Other Ambulatory Visit: Payer: Self-pay | Admitting: Family Medicine

## 2013-10-01 ENCOUNTER — Ambulatory Visit
Admission: RE | Admit: 2013-10-01 | Discharge: 2013-10-01 | Disposition: A | Discharge status: Home / Self Care | Payer: PRIVATE HEALTH INSURANCE | Source: Ambulatory Visit | Attending: Radiation Oncology | Admitting: Radiation Oncology

## 2013-10-01 ENCOUNTER — Encounter: Payer: Self-pay | Admitting: Radiation Oncology

## 2013-10-01 VITALS — BP 149/84 | HR 54 | Temp 98.7°F | Ht 61.0 in | Wt 138.4 lb

## 2013-10-01 DIAGNOSIS — Z90710 Acquired absence of both cervix and uterus: Secondary | ICD-10-CM | POA: Insufficient documentation

## 2013-10-01 DIAGNOSIS — C539 Malignant neoplasm of cervix uteri, unspecified: Secondary | ICD-10-CM

## 2013-10-01 DIAGNOSIS — J45909 Unspecified asthma, uncomplicated: Secondary | ICD-10-CM | POA: Insufficient documentation

## 2013-10-01 DIAGNOSIS — Z87891 Personal history of nicotine dependence: Secondary | ICD-10-CM | POA: Insufficient documentation

## 2013-10-01 DIAGNOSIS — Z9079 Acquired absence of other genital organ(s): Secondary | ICD-10-CM | POA: Insufficient documentation

## 2013-10-01 DIAGNOSIS — Z79899 Other long term (current) drug therapy: Secondary | ICD-10-CM | POA: Insufficient documentation

## 2013-10-01 DIAGNOSIS — I10 Essential (primary) hypertension: Secondary | ICD-10-CM | POA: Insufficient documentation

## 2013-10-01 DIAGNOSIS — N83209 Unspecified ovarian cyst, unspecified side: Secondary | ICD-10-CM | POA: Insufficient documentation

## 2013-10-01 NOTE — Progress Notes (Signed)
Radiation Oncology         [phone] ________________________________  Initial outpatient Consultation  Name: TONYIA MARSCHALL MRN: [id number]  Date: 10/01/2013  DOB: 1966/03/24  IE:PPIRJJOA,CZYSAYTKZ, MD  Marti Sleigh *   REFERRING PHYSICIAN: Marti Sleigh *  DIAGNOSIS: Stage 1B-2  Adenocarcinoma of the Cervix  HISTORY OF PRESENT ILLNESS::Janaiah D Funches is a 48 y.o. female who is seen out of the courtesy of Dr. Marti Sleigh for an opinion concerning radiation therapy as part of management of patient's recently diagnosed adenocarcinoma of the cervix. The patient has a remote history of CIN treated with cryotherapy as well as a LEEP procedure. . Since March, she has had a clear vaginal discharge that has become more profuse. In June, she had an AGUS pap smear. Colposcopy in July was negative as was an ECC.  An ultrasound in November 2014 shows a thickening. A repeat ECC produced abundant tissue which was showed adenocarcinoma. A CT scan showed no adenopathy but an enlarged cervix mass measuring 5.1x4.4x5.2 cm. There was also a simple 5 cm ovarian cyst. On 09/05/13, she underwent a radical abdominal hysterectomy, bilateral salpingo-oophorectomy, pelvic lymphadenectomy, and placement of a suprapubic catheter by Dr. Marti Sleigh at [facility].. Her post-operative course was uneventful and she was discharged on POD #3 with lovenox post-op. Her final pathology revealed well differentiated invasive endocervical adenocarcinoma, tumor size 5 cm, depth of invasion 1.85cm with total wall thickness of 2.0cm (93%) with lymphovascular space invasion being present. Parametrial soft tissue invasion not identified with four parametrial nodes negative for malignancy. Surgical margins were clear with the invasive component coming within 2 mm of deep anterior soft tissue margin. 29 regional lymph nodes were sampled with 0 nodes involved. Overall stage 1B2  cervical adenocarcinoma. Given the presence of a deeply invasive cervical cancer, the patient is now seen in radiation oncology for consideration for postoperative treatments.  PREVIOUS RADIATION THERAPY: No  PAST MEDICAL HISTORY:  has a past medical history of Asthma; Hypertension; Seasonal allergies; and Cervical cancer.    PAST SURGICAL HISTORY: Past Surgical History  Procedure Laterality Date  . Foot surgery    . Radical abdominal hysterectomy  12/13/114  . Bilateral salpingoophorectomy  09/06/13    FAMILY HISTORY: family history includes Cancer in her mother; Diabetes in her father; Heart attack (age of onset: 49) in her father; Hypertension in her maternal grandmother; Stroke in her maternal grandmother.  SOCIAL HISTORY:  reports that she quit smoking about 9 years ago. She does not have any smokeless tobacco history on file. She reports that she drinks about 0.5 ounces of alcohol per week. She reports that she does not use illicit drugs. she is a Freight forwarder for a [employer] [place]  She is married.  ALLERGIES: Penicillins; Sulfa antibiotics; and Sulfonamide derivatives  MEDICATIONS:  Current Outpatient Prescriptions  Medication Sig Dispense Refill  . albuterol (VENTOLIN HFA) 108 (90 BASE) MCG/ACT inhaler INHALE TWO TO FOUR PUFFS BY MOUTH EVERY 6 HOURS AS NEEDED  18 g  1  . budesonide-formoterol (SYMBICORT) 160-4.5 MCG/ACT inhaler Inhale 2 puffs into the lungs 2 (two) times daily.  1 Inhaler  5  . cetirizine (ZYRTEC) 10 MG tablet Take 10 mg by mouth at bedtime.        . docusate sodium (STOOL SOFTENER) 100 MG capsule Take 100 mg by mouth.      . enoxaparin (LOVENOX) 40 MG/0.4ML injection Inject 40 mg into the skin.      [name]  ibuprofen (ADVIL,MOTRIN) 800 MG tablet Take 800 mg by mouth every 6 (six) hours as needed.       . magnesium oxide (MAG-OX) 400 MG tablet Take 400 mg by mouth.      . metoprolol (LOPRESSOR) 100 MG tablet Take 1 tablet (100 mg total) by mouth  2 (two) times daily.  60 tablet  5  . ondansetron (ZOFRAN-ODT) 4 MG disintegrating tablet Take 1 tablet (4 mg total) by mouth every 8 (eight) hours as needed for nausea.  20 tablet  0  . oxyCODONE-acetaminophen (PERCOCET) 5-325 MG per tablet Take 1-2 tablets by mouth every 6 (six) hours as needed for moderate pain or severe pain.  30 tablet  0  . PARoxetine (PAXIL) 10 MG tablet Take 1 tablet (10 mg total) by mouth every morning.  90 tablet  1  . traZODone (DESYREL) 50 MG tablet TAKE ONE TABLET BY MOUTH AT BEDTIME  30 tablet  0  . ciprofloxacin (CIPRO) 250 MG tablet Take 1 tablet (250 mg total) by mouth 2 (two) times daily.  14 tablet  0  . fluticasone (VERAMYST) 27.5 MCG/SPRAY nasal spray Place 2 sprays into the nose daily.  10 g  12  . montelukast (SINGULAIR) 10 MG tablet Take 10 mg by mouth daily.        [name] scopolamine (TRANSDERM-SCOP) 1.5 MG Apply 1.5 mg topically.       No current facility-administered medications for this encounter.    REVIEW OF SYSTEMS:  A 15 point review of systems is documented in the electronic medical record. This was obtained by the nursing staff. However, I reviewed this with the patient to discuss relevant findings and make appropriate changes.  She has constant pressure in the bladder region. She recently was diagnosed with bladder infection and treated with Cipro with some improvement, but still continued discomfort. The patient has had her suprapubic catheter removed. She has discomfort in the vaginal area with prolonged sitting. She denies any vaginal bleeding. She denies any rectal bleeding or hematuria. She continues to have a fair amount of fatigue at this time. She denies any abdominal pain or nausea.    PHYSICAL EXAM:  height is 5\' 1"  (1.549 m) and weight is 138 lb 6.4 oz (62.778 kg). Her temperature is 98.7 F (37.1 C). Her blood pressure is 149/84 and her pulse is 54. Her oxygen saturation is 99%.   BP 149/84  Pulse 54  Temp(Src) 98.7 F (37.1 C)  Ht 5\' 1"   (1.549 m)  Wt 138 lb 6.4 oz (62.778 kg)  BMI 26.16 kg/m2  SpO2 99%  General Appearance:    Alert, cooperative, no distress, appears stated age,  accompanied by mother on evaluation today,  she lies down to be more comfortable for much of the evaluation today   Head:    Normocephalic, without obvious abnormality, atraumatic  Eyes:    PERRL, conjunctiva/corneas clear, EOM's intact,     Ears:    Normal TM's and external ear canals, both ears  Nose:   Nares normal, septum midline, mucosa normal, no drainage    or sinus tenderness  Throat:   Lips, mucosa, and tongue normal; teeth and gums normal  Neck:   Supple, symmetrical, trachea midline, no adenopathy;    thyroid:  no enlargement/tenderness/nodules; no carotid   bruit or JVD  Back:     Symmetric, no curvature, ROM normal, no CVA tenderness  Lungs:     Clear to auscultation bilaterally, respirations unlabored  Chest Wall:  No tenderness or deformity   Heart:    Regular rate and rhythm, S1 and S2 normal, no murmur, rub   or gallop     Abdomen:     Soft, non-tender, bowel sounds active all four quadrants,    no masses, no organomegaly, tattoos in place , well-healed horizontal scar in the lower abdominal area   Genitalia:    deferred to simulation and planning day      Extremities:   Extremities normal, atraumatic, no cyanosis or edema  Pulses:   2+ and symmetric all extremities  Skin:   Skin color, texture, turgor normal, no rashes or lesions  Lymph nodes:   Cervical, supraclavicular, and axillary nodes normal, no inguinal adenopathy   Neurologic:    normal strength, sensation and reflexes    throughout     ECOG = 1   1 - Symptomatic but completely ambulatory (Restricted in physically strenuous activity but ambulatory and able to carry out work of a light or sedentary nature. For example, light housework, office work)  LABORATORY DATA:  Lab Results  Component Value Date   WBC 8.0 09/12/2013   HGB 9.7* 09/12/2013   HCT 28.9*  09/12/2013   MCV 96.7 09/12/2013   PLT 354 09/12/2013   Lab Results  Component Value Date   NA 138 01/06/2013   K 4.1 01/06/2013   CL 103 01/06/2013   CO2 28 01/06/2013   Lab Results  Component Value Date   ALT 13 01/06/2013   AST 19 01/06/2013   ALKPHOS 92 01/06/2013   BILITOT 0.7 01/06/2013     RADIOGRAPHY: Abdomen and pelvic CT scan on August 19, 2013   IMPRESSION:  5.2 cm cervical mass  No evidence of metastatic disease in the abdomen/pelvis.  5.2 cm right ovarian cyst/follicle, likely physiologic. Follow-up  pelvic ultrasound is suggested in 6-10 weeks.    IMPRESSION: Stage 1B-2  Adenocarcinoma of the Cervix. The patient was found to have a large tumor, deeply invasive showing lymphovascular space invasion.  The patient would be at risk for recurrence given the extent of invasion and would likely benefit from postoperative pelvic radiation therapy. I discussed the overall treatment course,  side effects and potential toxicities of radiation therapy with the patient and her mother. Patient appears to understand and wishes to proceed with planned course of treatment.  PLAN: Simulation and planning next week with treatments to begin approximately 6 weeks postop. The patient will receive between 45 gray and 50.4 gray. I am recommending intensity modulated radiation therapy to limit dose to the small bowel.  I spent 60 minutes minutes face to face with the patient and more than 50% of that time was spent in counseling and/or coordination of care.   ------------------------------------------------  -----------------------------------  Blair Promise, PhD, MD

## 2013-10-01 NOTE — Progress Notes (Signed)
Please see the Nurse Progress Note in the MD Initial Consult Encounter for this patient. 

## 2013-10-07 ENCOUNTER — Ambulatory Visit
Admission: RE | Admit: 2013-10-07 | Discharge: 2013-10-07 | Disposition: A | Discharge status: Home / Self Care | Payer: PRIVATE HEALTH INSURANCE | Source: Ambulatory Visit | Attending: Radiation Oncology | Admitting: Radiation Oncology

## 2013-10-07 VITALS — BP 122/75 | HR 65 | Temp 97.5°F | Resp 16 | Ht 61.0 in | Wt 141.1 lb

## 2013-10-07 DIAGNOSIS — Z510 Encounter for antineoplastic radiation therapy: Secondary | ICD-10-CM | POA: Insufficient documentation

## 2013-10-07 DIAGNOSIS — C539 Malignant neoplasm of cervix uteri, unspecified: Secondary | ICD-10-CM

## 2013-10-07 DIAGNOSIS — R110 Nausea: Secondary | ICD-10-CM | POA: Insufficient documentation

## 2013-10-07 DIAGNOSIS — R5381 Other malaise: Secondary | ICD-10-CM | POA: Insufficient documentation

## 2013-10-07 DIAGNOSIS — R109 Unspecified abdominal pain: Secondary | ICD-10-CM | POA: Insufficient documentation

## 2013-10-07 DIAGNOSIS — R5383 Other fatigue: Secondary | ICD-10-CM | POA: Insufficient documentation

## 2013-10-07 DIAGNOSIS — Z79899 Other long term (current) drug therapy: Secondary | ICD-10-CM | POA: Insufficient documentation

## 2013-10-07 MED ORDER — SODIUM CHLORIDE 0.9 % IJ SOLN
10.0000 mL | Freq: Once | INTRAMUSCULAR | Status: AC
  Administered 2013-10-07: 10 mL via INTRAVENOUS

## 2013-10-07 NOTE — Progress Notes (Signed)
#  22 gauge IV in right wrist removed intact.  Pressure and bandaid applied.

## 2013-10-07 NOTE — Progress Notes (Signed)
Inserted #22 gauge IV into her right wrist.  Blood return noted.  Flushed with 10 cc normal saline.  Notfied CT SIM that she is ready.

## 2013-10-12 NOTE — Progress Notes (Signed)
  Radiation Oncology         [phone] ________________________________  Name: Anna Ramsey MRN: [id number]  Date: 10/07/2013  DOB: 09/28/1965  SIMULATION AND TREATMENT PLANNING NOTE  DIAGNOSIS:  Stage 1B-2 Adenocarcinoma of the Cervix   NARRATIVE:  The patient was brought to the [facility] suite.  Identity was confirmed.  All relevant records and images related to the planned course of therapy were reviewed.  The patient freely provided informed written consent to proceed with treatment after reviewing the details related to the planned course of therapy. The consent form was witnessed and verified by the simulation staff.  Then, the patient was set-up in a stable reproducible  supine position for radiation therapy.  CT images were obtained.  Surface markings were placed.  The CT images were loaded into the planning software.  Then the target and avoidance structures were contoured.  Treatment planning then occurred.  The radiation prescription was entered and confirmed.  Then, I designed and supervised the construction of a total of 1 medically necessary complex treatment devices.  I have requested : Intensity Modulated Radiotherapy (IMRT) is medically necessary for this case for the following reason:  Small bowel sparing..  I have ordered:dose calc.  PLAN:  The patient will receive 50.4 Gy in 28 fractions.  ________________________________  -----------------------------------  Blair Promise, PhD, MD

## 2013-10-13 ENCOUNTER — Telehealth: Payer: Self-pay | Admitting: *Deleted

## 2013-10-13 NOTE — Telephone Encounter (Signed)
Pt called [facility] on Saturday with c/o abdomial discomfort/fever. Returned pt's call this am pt reports going to [facility] ED at the direction of the [facility] Dr she spoke with on Saturday. CT, flu test, urine test all negative. Blood cultures pending. Pt advised she stayed over night and there were no additional findings for the abdominal discomfort and fever she had,pt was given a binder to use for abdominal discomfort when walking. Pt states " The [place]esa is helping and I don't have any abdominal pain now,but it's sore when I touch around the area." Pt denies redness, drainage,to surgical site, no fevers taking 800mg  Motrin q 8hrs for pain. Pt advised she does not take the percocet unless she has too, as she is afraid she will run out. Pt denies any heavy lifting, vaginal bleeding, no pain with urination. Discussed with pt to try taking half a percocet, with food, waiting about 38mins and take a walk, try this again prior to dinner and see if this helps with discomfort. Discussed with pt if she is not up and walking then it can create more abdominal discomfort when she does get up. Recommended pt also take 1/2 percocet prior to bed time if having pain.Continnue to take Motrin as directed. Pt should try this routine for the next few days and call our office with update on her status. Pt also mentioned she takes BP medication and has recently lost about 15lbs. Pt noticed at [facility] her BP was very lower than normal. Recommended pt call her PCP or MD who refills BP medication and let their office know in case her dose needs to be changed. Pt verbalized understanding and confirmed she will call back later in the week with an update. Pt concerns forward to NP for review.

## 2013-10-15 ENCOUNTER — Telehealth: Payer: Self-pay | Admitting: Dietician

## 2013-10-15 NOTE — Telephone Encounter (Signed)
Brief Outpatient Oncology Nutrition Note  Patient has been identified to be at risk on malnutrition screen.  Wt Readings from Last 10 Encounters:  10/07/13 141 lb 1.6 oz (64.003 kg)  10/01/13 138 lb 6.4 oz (62.778 kg)  09/22/13 141 lb 8 oz (64.184 kg)  09/12/13 145 lb (65.772 kg)  08/26/13 146 lb 12.8 oz (66.588 kg)  01/06/13 129 lb (58.514 kg)  06/17/12 138 lb (62.596 kg)  04/11/12 146 lb (66.225 kg)  02/26/12 148 lb (67.132 kg)  02/15/12 145 lb (65.772 kg)    Patient with cervical cancer to begin radiation therapy.  Called patient secondary to weight loss.  Patient reports that she has been doing better with less nausea and better intake over the past week.  She is trying to eat every 3 hours. Encouraged patient to call the [facility] as needed.  Antonieta Iba, RD, LDN

## 2013-10-17 ENCOUNTER — Encounter: Payer: Self-pay | Admitting: *Deleted

## 2013-10-17 NOTE — Progress Notes (Signed)
Anna Ramsey Psychosocial Distress Screening Clinical Social Work  Clinical Social Work was referred by distress screening protocol.  The patient scored a 6 on the Psychosocial Distress Thermometer which indicates moderate distress. Clinical Social Worker phoned to assess for distress and other psychosocial needs. Pt reports to be coping well and feels she is adjusting currently. She was surprised by CSW phone call and CSW further discussed resources and GYN Support Group. Pt very interested in the group and CSW will inform group facilitator. Pt agrees to reach out as needed.    Clinical Social Worker follow up needed: no  Loren Racer, [place] Social Worker [facility] [place] Wednesday, Thursday and Friday Phone: [phone] Fax: [phone]

## 2013-10-20 ENCOUNTER — Ambulatory Visit
Admission: RE | Admit: 2013-10-20 | Discharge: 2013-10-20 | Disposition: A | Discharge status: Home / Self Care | Payer: PRIVATE HEALTH INSURANCE | Source: Ambulatory Visit | Attending: Radiation Oncology | Admitting: Radiation Oncology

## 2013-10-20 ENCOUNTER — Ambulatory Visit: Payer: PRIVATE HEALTH INSURANCE | Attending: Gynecology | Admitting: Gynecology

## 2013-10-20 ENCOUNTER — Encounter: Payer: Self-pay | Admitting: Gynecology

## 2013-10-20 VITALS — BP 126/89 | HR 58 | Temp 97.8°F | Ht 61.69 in | Wt 135.8 lb

## 2013-10-20 DIAGNOSIS — C539 Malignant neoplasm of cervix uteri, unspecified: Secondary | ICD-10-CM

## 2013-10-20 DIAGNOSIS — Z87891 Personal history of nicotine dependence: Secondary | ICD-10-CM | POA: Insufficient documentation

## 2013-10-20 DIAGNOSIS — Z90710 Acquired absence of both cervix and uterus: Secondary | ICD-10-CM | POA: Insufficient documentation

## 2013-10-20 DIAGNOSIS — Z79899 Other long term (current) drug therapy: Secondary | ICD-10-CM | POA: Insufficient documentation

## 2013-10-20 DIAGNOSIS — I10 Essential (primary) hypertension: Secondary | ICD-10-CM | POA: Insufficient documentation

## 2013-10-20 DIAGNOSIS — Z9079 Acquired absence of other genital organ(s): Secondary | ICD-10-CM | POA: Insufficient documentation

## 2013-10-20 NOTE — Progress Notes (Deleted)
The patient is now scheduled for surgery at [facility] on September 05, 2013.  She will come over this next Monday for a preop workup.

## 2013-10-20 NOTE — Patient Instructions (Signed)
Follow up with Dr. Fermin Schwab on Dec 05, 2013 at 10:30am. No intercourse until after you have completed radiation. Call us with any concerns.

## 2013-10-20 NOTE — Progress Notes (Signed)
Consult Note: Gyn-Onc   Anna Ramsey 48 y.o. female  Chief Complaint  Patient presents with  . Adenocarcinoma of cervix    Assessment :Stage IB2 adenocarcinoma of the cervix. The patient has intermediate risk factors and therefore will receive whole pelvis radiation therapy.  Plan:   The patient will initiate radiation therapy today. I will plan on seeing the patient back a week or 2 after completing radiation therapy or as needed. HPI:  48yo WMF seen in consultation at the request of Dr. [name] regarding management of a newly diagnosed adenocarcinoma of the cervix.   The patient has a remote history of CIN treated with cryotherapy as well as a LEEP procedure.  .  Since March, she has had a clear vaginal discharge that has become more profuse.    In June she had an AGUS pap smear.  Colposcopy in July was negative as was an ECC (path reviewed) An ultrasound in November 2014 shows a thickening.  A repeat ECC produced abundant tissue which was an adenocarcinoma.  A CT scan shows no adenopathy but an enlarged cervix mass measuring 5.1x4.4x5.2 cm.  There was also a simple 5 cm ovarian cyst. She underwent a radical hysterectomy, bilateral salpingo-oophorectomy, and pelvic lymphadenectomy on 09/05/2013. Final pathology showed a endocervical adenocarcinoma measuring 5 cm in diameter invading 1.85 cm of a total wall thickness of 2.0 cm. Lymph vascular space invasion was also noted. Surgical margins were clear and 29 lymph nodes were negative. The patient had an uncomplicated postoperative course.  Given the intermediate risk factors postoperative pelvic radiation was recommended.  Review of Systems:10 point review of systems is negative except as noted in interval history.   Vitals: Blood pressure 126/89, pulse 58, temperature 97.8 F (36.6 C), temperature source Oral, height 5' 1.69" (1.567 m), weight 135 lb 12.8 oz (61.598 kg).  Physical Exam: General : The patient is a healthy  woman in no acute distress.  HEENT: normocephalic, extraoccular movements normal; neck is supple without thyromegally  Lynphnodes: Supraclavicular and inguinal nodes not enlarged  Abdomen: Soft, non-tender, no ascites, no organomegally, no masses, no hernias Pfannenstiel incision and suprapubic incisions are healing well. Pelvic:  EGBUS: Normal female  Vagina: Normal, no lesions  Urethra and Bladder: Normal, non-tender  Cervix: Surgically absent Uterus:  Surgically absent Bi-manual examination: Non-tender; no adenxal masses or nodularity  Rectal: normal sphincter tone, no masses, no blood  Lower extremities: Normal without any varicosities or edema     Allergies  Allergen Reactions  . Penicillins Other (See Comments)    unknown  . Sulfa Antibiotics Other (See Comments)    Numbness in lower extremeties  . Sulfonamide Derivatives     Past Medical History  Diagnosis Date  . Asthma     hx in childhood  . Hypertension   . Seasonal allergies   . Cervical cancer     Past Surgical History  Procedure Laterality Date  . Foot surgery    . Radical abdominal hysterectomy  12/13/114  . Bilateral salpingoophorectomy  09/06/13    Current Outpatient Prescriptions  Medication Sig Dispense Refill  . albuterol (VENTOLIN HFA) 108 (90 BASE) MCG/ACT inhaler INHALE TWO TO FOUR PUFFS BY MOUTH EVERY 6 HOURS AS NEEDED  18 g  1  . budesonide-formoterol (SYMBICORT) 160-4.5 MCG/ACT inhaler Inhale 2 puffs into the lungs 2 (two) times daily.  1 Inhaler  5  . cetirizine (ZYRTEC) 10 MG tablet Take 10 mg by mouth at bedtime.        [name]  docusate sodium (STOOL SOFTENER) 100 MG capsule Take 100 mg by mouth.      . metoprolol (LOPRESSOR) 100 MG tablet Take 1 tablet (100 mg total) by mouth 2 (two) times daily.  60 tablet  5  . oxyCODONE-acetaminophen (PERCOCET) 5-325 MG per tablet Take 1-2 tablets by mouth every 6 (six) hours as needed for moderate pain or severe pain.  30 tablet  0  . PARoxetine (PAXIL) 10  MG tablet Take 1 tablet (10 mg total) by mouth every morning.  90 tablet  1  . scopolamine (TRANSDERM-SCOP) 1.5 MG Apply 1.5 mg topically.      . traZODone (DESYREL) 50 MG tablet TAKE ONE TABLET BY MOUTH AT BEDTIME  30 tablet  0  . fluticasone (VERAMYST) 27.5 MCG/SPRAY nasal spray Place 2 sprays into the nose daily.  10 g  12  . ibuprofen (ADVIL,MOTRIN) 800 MG tablet Take 800 mg by mouth every 6 (six) hours as needed.       . ondansetron (ZOFRAN-ODT) 4 MG disintegrating tablet Take 1 tablet (4 mg total) by mouth every 8 (eight) hours as needed for nausea.  20 tablet  0  . oxyCODONE-acetaminophen (PERCOCET) 10-325 MG per tablet Take by mouth.       No current facility-administered medications for this visit.    History   Social History  . Marital Status: Married    Spouse Name: N/A    Number of Children: 1  . Years of Education: N/A   Occupational History  .     Social History Main Topics  . Smoking status: Former Smoker    Quit date: 09/25/2004  . Smokeless tobacco: Not on file  . Alcohol Use: 0.5 oz/week    1 drink(s) per week  . Drug Use: No  . Sexual Activity: Yes   Other Topics Concern  . Not on file   Social History Narrative  . No narrative on file    Family History  Problem Relation Age of Onset  . Heart attack Father 24  . Diabetes Father   . Hypertension Maternal Grandmother   . Stroke Maternal Grandmother   . Cancer Mother     breast/thyroid      Alvino Chapel, MD 10/20/2013, 8:58 AM

## 2013-10-20 NOTE — Progress Notes (Signed)
   Department of Radiation Oncology  Phone:  [phone] Fax:        [phone]  Intensity modulated radiation therapy device note  Today the patient began radiation therapy directed at the pelvis region. Patient will be treated with intensity modulated radiation therapy in using a helical approach. Patient today had development of her sinogram segments for treatment. The patient will be treated with 29.3 sinogram segments. This constitutes 1 IM RT device .-----------------------------------  Blair Promise, PhD, MD

## 2013-10-21 ENCOUNTER — Ambulatory Visit
Admission: RE | Admit: 2013-10-21 | Discharge: 2013-10-21 | Disposition: A | Discharge status: Home / Self Care | Payer: PRIVATE HEALTH INSURANCE | Source: Ambulatory Visit | Attending: Radiation Oncology | Admitting: Radiation Oncology

## 2013-10-21 VITALS — BP 128/86 | HR 74 | Temp 98.3°F | Ht 61.69 in | Wt 137.5 lb

## 2013-10-21 DIAGNOSIS — C539 Malignant neoplasm of cervix uteri, unspecified: Secondary | ICD-10-CM

## 2013-10-21 NOTE — Progress Notes (Signed)
[facility]     Rexene Edison, M.D. [place], [place] [address]               Blair Promise, M.D., Ph.D. Phone: [phone]      [place] A. Tammi Klippel, M.D. Fax: [phone]      Jodelle Gross, M.D., Ph.D.         Thea Silversmith, M.D.         Wyvonnia Lora, M.D Weekly Treatment Management Note  Name: Anna Ramsey     MRN: [id number]        CSN: [id number] Date: 10/21/2013      DOB: Jan 02, 1966  CC: Beatrice Lecher, MD         [place]    Status: Outpatient  Diagnosis: The encounter diagnosis was Adenocarcinoma of cervix, stage 1.  Current Dose: 3.6 Gy  Current Fraction: 2  Planned Dose: 50.4 Gy  Narrative: [name] was seen today for weekly treatment management. The chart was checked and MVCT  were reviewed. She is tolerating the treatments well without any side effects at this time.  Penicillins; Sulfa antibiotics; and Sulfonamide derivatives  Current Outpatient Prescriptions  Medication Sig Dispense Refill  . albuterol (VENTOLIN HFA) 108 (90 BASE) MCG/ACT inhaler INHALE TWO TO FOUR PUFFS BY MOUTH EVERY 6 HOURS AS NEEDED  18 g  1  . budesonide-formoterol (SYMBICORT) 160-4.5 MCG/ACT inhaler Inhale 2 puffs into the lungs 2 (two) times daily.  1 Inhaler  5  . cetirizine (ZYRTEC) 10 MG tablet Take 10 mg by mouth at bedtime.        . docusate sodium (STOOL SOFTENER) 100 MG capsule Take 100 mg by mouth.      . fluticasone (VERAMYST) 27.5 MCG/SPRAY nasal spray Place 2 sprays into the nose daily.  10 g  12  . ibuprofen (ADVIL,MOTRIN) 800 MG tablet Take 800 mg by mouth every 6 (six) hours as needed.       . metoprolol (LOPRESSOR) 100 MG tablet Take 1 tablet (100 mg total) by mouth 2 (two) times daily.  60 tablet  5  . ondansetron (ZOFRAN-ODT) 4 MG disintegrating tablet Take 1 tablet (4 mg total) by mouth every 8 (eight) hours as needed for nausea.  20 tablet  0  . oxyCODONE-acetaminophen (PERCOCET)  10-325 MG per tablet Take by mouth.      . oxyCODONE-acetaminophen (PERCOCET) 5-325 MG per tablet Take 1-2 tablets by mouth every 6 (six) hours as needed for moderate pain or severe pain.  30 tablet  0  . PARoxetine (PAXIL) 10 MG tablet Take 1 tablet (10 mg total) by mouth every morning.  90 tablet  1  . scopolamine (TRANSDERM-SCOP) 1.5 MG Apply 1.5 mg topically.      . traZODone (DESYREL) 50 MG tablet TAKE ONE TABLET BY MOUTH AT BEDTIME  30 tablet  0   No current facility-administered medications for this encounter.     Physical Examination:  Filed Vitals:   10/21/13 1735  BP: 128/86  Pulse: 74  Temp: 98.3 F (36.8 C)    Wt Readings from Last 3 Encounters:  10/21/13 137 lb 8 oz (62.37 kg)  10/20/13 135 lb 12.8 oz (61.598 kg)  10/07/13 141 lb 1.6 oz (64.003 kg)     Lungs - Normal respiratory effort, chest expands symmetrically. Lungs are clear to auscultation, no crackles or wheezes.  Heart has regular rhythm and rate  Abdomen is soft and non  tender with normal bowel sounds  Assessment:  Patient tolerating treatments well  Plan: Continue treatment per original radiation prescription

## 2013-10-22 ENCOUNTER — Ambulatory Visit
Admission: RE | Admit: 2013-10-22 | Discharge: 2013-10-22 | Disposition: A | Discharge status: Home / Self Care | Payer: PRIVATE HEALTH INSURANCE | Source: Ambulatory Visit | Attending: Radiation Oncology | Admitting: Radiation Oncology

## 2013-10-23 ENCOUNTER — Ambulatory Visit
Admission: RE | Admit: 2013-10-23 | Discharge: 2013-10-23 | Disposition: A | Discharge status: Home / Self Care | Payer: PRIVATE HEALTH INSURANCE | Source: Ambulatory Visit | Attending: Radiation Oncology | Admitting: Radiation Oncology

## 2013-10-23 ENCOUNTER — Telehealth: Payer: Self-pay | Admitting: Oncology

## 2013-10-23 NOTE — Telephone Encounter (Signed)
Anna Ramsey called and said she has been having nausea since she started radiation.  She said the nausea starts as soon as she gets up in the morning and is lasting all day. She is wondering if she can have medication for nausea called in to [facility] in [place].

## 2013-10-23 NOTE — Addendum Note (Signed)
Encounter addended by: Jacqulyn Liner, RN on: 10/23/2013 10:22 AM<BR>     Documentation filed: Demographics Visit

## 2013-10-23 NOTE — Telephone Encounter (Signed)
Called in prescription per Dr. Sondra Come for compazine 10 mg PO q 6 hours PRN nausea.  Dispense 30 tablets. 1 refill.    Called Anna Ramsey to let her know that compazine has been called in.  Advised her to take 1 tablet 1-2 hours before radiation treatment.  Anna Ramsey verbalized agreement.

## 2013-10-24 ENCOUNTER — Telehealth: Payer: Self-pay | Admitting: *Deleted

## 2013-10-24 ENCOUNTER — Ambulatory Visit
Admission: RE | Admit: 2013-10-24 | Discharge: 2013-10-24 | Disposition: A | Discharge status: Home / Self Care | Payer: PRIVATE HEALTH INSURANCE | Source: Ambulatory Visit | Attending: Radiation Oncology | Admitting: Radiation Oncology

## 2013-10-24 NOTE — Telephone Encounter (Signed)
Pt called with complaints of sinus infection. Pt denies fever, cough, drainage. Pt reports she cant drive as her head feels like "it's going to come off" Requesting Antibiotic to be called into her pharmacy. Reviewed with NP, instructed pt per NP to follow up with here PCP or urgent care for further evaluation. Pt verbalized understanding, no further concerns.

## 2013-10-27 ENCOUNTER — Ambulatory Visit
Admission: RE | Admit: 2013-10-27 | Discharge: 2013-10-27 | Disposition: A | Discharge status: Home / Self Care | Payer: PRIVATE HEALTH INSURANCE | Source: Ambulatory Visit | Attending: Radiation Oncology | Admitting: Radiation Oncology

## 2013-10-28 ENCOUNTER — Other Ambulatory Visit: Payer: Self-pay | Admitting: Family Medicine

## 2013-10-28 ENCOUNTER — Encounter: Payer: Self-pay | Admitting: Radiation Oncology

## 2013-10-28 ENCOUNTER — Ambulatory Visit
Admission: RE | Admit: 2013-10-28 | Discharge: 2013-10-28 | Disposition: A | Discharge status: Home / Self Care | Payer: PRIVATE HEALTH INSURANCE | Source: Ambulatory Visit | Attending: Radiation Oncology | Admitting: Radiation Oncology

## 2013-10-28 VITALS — BP 130/82 | HR 67 | Temp 97.9°F | Resp 20 | Wt 135.8 lb

## 2013-10-28 DIAGNOSIS — C539 Malignant neoplasm of cervix uteri, unspecified: Secondary | ICD-10-CM

## 2013-10-28 MED ORDER — ONDANSETRON HCL 8 MG PO TABS: 8.0000 mg | ORAL_TABLET | Freq: Three times a day (TID) | ORAL | Status: DC | PRN

## 2013-10-28 NOTE — Progress Notes (Signed)
  Radiation Oncology         [phone] ________________________________  Name: Anna Ramsey MRN: [id number]  Date: 10/28/2013  DOB: 02-Dec-1965  Weekly Radiation Therapy Management  Current Dose: 12.6 Gy     Planned Dose:  50.4 Gy  Narrative . . . . . . . . The patient presents for routine under treatment assessment.                                   The patient continues to have problems with nausea despite Compazine. Patient will be given a prescription for Zofran in light of this issue. She also complains of sinus pressure but is taking usual medications for this issue. She is having some sensitivity along the perineum with wiping after urination and will use baby wipes instead of toilet tissue.                                 Set-up films were reviewed.                                 The chart was checked. Physical Findings. . .  weight is 135 lb 12.8 oz (61.598 kg). Her oral temperature is 97.9 F (36.6 C). Her blood pressure is 130/82 and her pulse is 67. Her respiration is 20. . Weight essentially stable.  No significant changes. Impression . . . . . . . The patient is tolerating radiation. Plan . . . . . . . . . . . . Continue treatment as planned.  ________________________________  -----------------------------------  Blair Promise, PhD, MD

## 2013-10-28 NOTE — Progress Notes (Signed)
Weekly rad txs rectum, having some dysuria, normal stools stated, no blood in urine or stools, using baby wipes, dove unscented soap, will start sitz baths soon stated, c/o nausea, compazine not helping, has sinus issues, poor appetite, makes herself eat, loss 2 lbs ,  8:59 AM

## 2013-10-29 ENCOUNTER — Ambulatory Visit
Admission: RE | Admit: 2013-10-29 | Discharge: 2013-10-29 | Disposition: A | Discharge status: Home / Self Care | Payer: PRIVATE HEALTH INSURANCE | Source: Ambulatory Visit | Attending: Radiation Oncology | Admitting: Radiation Oncology

## 2013-10-30 ENCOUNTER — Ambulatory Visit
Admission: RE | Admit: 2013-10-30 | Discharge: 2013-10-30 | Disposition: A | Discharge status: Home / Self Care | Payer: PRIVATE HEALTH INSURANCE | Source: Ambulatory Visit | Attending: Radiation Oncology | Admitting: Radiation Oncology

## 2013-10-31 ENCOUNTER — Ambulatory Visit
Admission: RE | Admit: 2013-10-31 | Discharge: 2013-10-31 | Disposition: A | Discharge status: Home / Self Care | Payer: PRIVATE HEALTH INSURANCE | Source: Ambulatory Visit | Attending: Radiation Oncology | Admitting: Radiation Oncology

## 2013-11-03 ENCOUNTER — Ambulatory Visit
Admission: RE | Admit: 2013-11-03 | Discharge: 2013-11-03 | Disposition: A | Discharge status: Home / Self Care | Payer: PRIVATE HEALTH INSURANCE | Source: Ambulatory Visit | Attending: Radiation Oncology | Admitting: Radiation Oncology

## 2013-11-04 ENCOUNTER — Ambulatory Visit
Admission: RE | Admit: 2013-11-04 | Discharge: 2013-11-04 | Disposition: A | Discharge status: Home / Self Care | Payer: PRIVATE HEALTH INSURANCE | Source: Ambulatory Visit | Attending: Radiation Oncology | Admitting: Radiation Oncology

## 2013-11-04 VITALS — BP 121/76 | HR 60 | Temp 98.4°F | Ht 61.0 in | Wt 136.1 lb

## 2013-11-04 DIAGNOSIS — C539 Malignant neoplasm of cervix uteri, unspecified: Secondary | ICD-10-CM

## 2013-11-04 NOTE — Progress Notes (Signed)
  Radiation Oncology         [phone] ________________________________  Name: Anna Ramsey MRN: [id number]  Date: 11/04/2013  DOB: 06/25/1966  Weekly Radiation Therapy Management  Current Dose: 21.6 Gy     Planned Dose:  50.4 Gy  Narrative . . . . . . . . The patient presents for routine under treatment assessment.                                   The patient is without complaint except for persistent nausea. This however is controlled well with Compazine.  She also has some mild perirectal irritation.                                 Set-up films were reviewed.                                 The chart was checked. Physical Findings. . .  height is 5\' 1"  (1.549 m) and weight is 136 lb 1.6 oz (61.735 kg). Her temperature is 98.4 F (36.9 C). Her blood pressure is 121/76 and her pulse is 60. . Weight essentially stable.  No significant changes. Impression . . . . . . . The patient is tolerating radiation. Plan . . . . . . . . . . . . Continue treatment as planned.  ________________________________  -----------------------------------  Blair Promise, PhD, MD

## 2013-11-04 NOTE — Progress Notes (Signed)
Anna Ramsey has had 12 fractions to her pelvis.  She denies pain.  She does have nausea as soon as she wakes up in the morning and all day.  She is taking compazine q 8 hours.  She denies bladder changes and diarrhea.  She says her rectal area is sore and she is using baby wipes.  She reports trouble sleeping.  She takes trazadone but has been waking up several times a night.  She is also unable to nap during the day.  She denies rectal/vaginal bleeding.

## 2013-11-05 ENCOUNTER — Ambulatory Visit
Admission: RE | Admit: 2013-11-05 | Discharge: 2013-11-05 | Disposition: A | Discharge status: Home / Self Care | Payer: PRIVATE HEALTH INSURANCE | Source: Ambulatory Visit | Attending: Radiation Oncology | Admitting: Radiation Oncology

## 2013-11-06 ENCOUNTER — Telehealth: Payer: Self-pay | Admitting: Oncology

## 2013-11-06 ENCOUNTER — Ambulatory Visit
Admission: RE | Admit: 2013-11-06 | Discharge: 2013-11-06 | Disposition: A | Discharge status: Home / Self Care | Payer: PRIVATE HEALTH INSURANCE | Source: Ambulatory Visit | Attending: Radiation Oncology | Admitting: Radiation Oncology

## 2013-11-06 NOTE — Telephone Encounter (Signed)
Anna Ramsey called asking if she can get a script for a motorized wheel chair.  She said she is very fatigued from treatment and is having to use the motorized scooters at the grocery store.

## 2013-11-07 ENCOUNTER — Ambulatory Visit
Admission: RE | Admit: 2013-11-07 | Discharge: 2013-11-07 | Disposition: A | Discharge status: Home / Self Care | Payer: PRIVATE HEALTH INSURANCE | Source: Ambulatory Visit | Attending: Radiation Oncology | Admitting: Radiation Oncology

## 2013-11-10 ENCOUNTER — Ambulatory Visit
Admission: RE | Admit: 2013-11-10 | Discharge: 2013-11-10 | Disposition: A | Discharge status: Home / Self Care | Payer: PRIVATE HEALTH INSURANCE | Source: Ambulatory Visit | Attending: Radiation Oncology | Admitting: Radiation Oncology

## 2013-11-11 ENCOUNTER — Encounter: Payer: Self-pay | Admitting: Radiation Oncology

## 2013-11-11 ENCOUNTER — Ambulatory Visit
Admission: RE | Admit: 2013-11-11 | Discharge: 2013-11-11 | Disposition: A | Discharge status: Home / Self Care | Payer: PRIVATE HEALTH INSURANCE | Source: Ambulatory Visit | Attending: Radiation Oncology | Admitting: Radiation Oncology

## 2013-11-11 ENCOUNTER — Telehealth: Payer: Self-pay | Admitting: Oncology

## 2013-11-11 VITALS — BP 112/83 | HR 71 | Temp 98.0°F | Resp 20 | Wt 137.6 lb

## 2013-11-11 DIAGNOSIS — C539 Malignant neoplasm of cervix uteri, unspecified: Secondary | ICD-10-CM

## 2013-11-11 MED ORDER — OXYCODONE-ACETAMINOPHEN 10-325 MG PO TABS: 30.0000 | ORAL_TABLET | Freq: Four times a day (QID) | ORAL | Status: DC | PRN

## 2013-11-11 NOTE — Progress Notes (Signed)
  Radiation Oncology         [phone] ________________________________  Name: Anna Ramsey MRN: [id number]  Date: 11/11/2013  DOB: [birth year]  Weekly Radiation Therapy Management  Current Dose: 30.6 Gy     Planned Dose:  50.4 Gy  Narrative . . . . . . . . The patient presents for routine under treatment assessment.                                   The patient is without complaint except for soreness/pain along the skin and soft tissue of the lower anterior abdominal region.  She denies any bladder symptoms or bowel complaints. She will have nausea if she does not take her Zofran on a regular basis. She is quite fatigued at this time.                                 Set-up films were reviewed.                                 The chart was checked. Physical Findings. . .  weight is 137 lb 9.6 oz (62.415 kg). Her oral temperature is 98 F (36.7 C). Her blood pressure is 112/83 and her pulse is 71. Her respiration is 20. . Weight essentially stable.  No significant changes.  No rebound or guarding present. Bowel sounds are normal. No appreciable radiation reaction along the skin of the lower abd. Or suprapubic region. Patient is sensitive to touch in this area. Impression . . . . . . . The patient is tolerating radiation. Plan . . . . . . . . . . . . Continue treatment as planned.  ________________________________  -----------------------------------  Blair Promise, PhD, MD

## 2013-11-11 NOTE — Progress Notes (Addendum)
Pt states she has felt "an internal pain near her cuff" x a few days. She states this pain is not in her vagina and is worse with sitting or standing. She denies vaginal discharge, diarrhea. She states she has urinary frequency, denies other urinary issues. Pt states she takes Percocet once a day at night and gets good relief.  She also states her lower abdomen has "been sore lately". Pt is fatigued, denies other issues, problems today.

## 2013-11-11 NOTE — Telephone Encounter (Signed)
Talked to Fara Olden at [facility] in [place].  Verified that the prescription for oxyCODONE-acetaminophen (PERCOCET) 10-325 MG per tablet should be for 1 tablet every 6 hours PRN pain per Dr. Sondra Come.

## 2013-11-12 ENCOUNTER — Ambulatory Visit
Admission: RE | Admit: 2013-11-12 | Discharge: 2013-11-12 | Disposition: A | Discharge status: Home / Self Care | Payer: PRIVATE HEALTH INSURANCE | Source: Ambulatory Visit | Attending: Radiation Oncology | Admitting: Radiation Oncology

## 2013-11-13 ENCOUNTER — Ambulatory Visit
Admission: RE | Admit: 2013-11-13 | Discharge: 2013-11-13 | Disposition: A | Discharge status: Home / Self Care | Payer: PRIVATE HEALTH INSURANCE | Source: Ambulatory Visit | Attending: Radiation Oncology | Admitting: Radiation Oncology

## 2013-11-14 ENCOUNTER — Ambulatory Visit
Admission: RE | Admit: 2013-11-14 | Discharge: 2013-11-14 | Disposition: A | Discharge status: Home / Self Care | Payer: PRIVATE HEALTH INSURANCE | Source: Ambulatory Visit | Attending: Radiation Oncology | Admitting: Radiation Oncology

## 2013-11-17 ENCOUNTER — Ambulatory Visit
Admission: RE | Admit: 2013-11-17 | Discharge: 2013-11-17 | Disposition: A | Discharge status: Home / Self Care | Payer: PRIVATE HEALTH INSURANCE | Source: Ambulatory Visit | Attending: Radiation Oncology | Admitting: Radiation Oncology

## 2013-11-18 ENCOUNTER — Ambulatory Visit: Payer: PRIVATE HEALTH INSURANCE | Admitting: Radiation Oncology

## 2013-11-18 ENCOUNTER — Ambulatory Visit: Payer: PRIVATE HEALTH INSURANCE

## 2013-11-19 ENCOUNTER — Ambulatory Visit
Admission: RE | Admit: 2013-11-19 | Discharge: 2013-11-19 | Disposition: A | Discharge status: Home / Self Care | Payer: PRIVATE HEALTH INSURANCE | Source: Ambulatory Visit | Attending: Radiation Oncology | Admitting: Radiation Oncology

## 2013-11-19 VITALS — BP 126/88 | HR 65 | Temp 97.8°F | Ht 61.0 in | Wt 135.8 lb

## 2013-11-19 DIAGNOSIS — C539 Malignant neoplasm of cervix uteri, unspecified: Secondary | ICD-10-CM

## 2013-11-19 NOTE — Progress Notes (Signed)
Anna Ramsey has had 22 fractions to her pelvis.  She denies pain, dysuria, vaginal/rectal bleeding and skin irritation.  She reports fatigue.  She reports nausea occasionally.

## 2013-11-19 NOTE — Progress Notes (Signed)
[facility]     Anna Ramsey, M.D. [place], [place] [address]               Anna Ramsey, M.D., Ph.D. Phone: [phone]      Anna Ramsey, M.D. Fax: [phone]      Anna Ramsey, M.D., Ph.D.         Anna Ramsey, M.D.         Anna Ramsey, M.D Weekly Treatment Management Note  Name: Anna Ramsey     MRN: [id number]        CSN: [id number] Date: 11/19/2013      DOB: Nov 29, 1965  CC: Anna Lecher, MD         Anna Ramsey    Status: Outpatient  Diagnosis: The encounter diagnosis was Adenocarcinoma of cervix, stage 1.  Current Dose: 39.6 Gy  Current Fraction: 22  Planned Dose: 50.4 Gy  Narrative: Anna Ramsey was seen today for weekly treatment management. The chart was checked and MVCT  were reviewed. She is tolerating the treatments well this time.. She denies any significant urinary or bowel complaints. She does have some fatigue.  Penicillins; Sulfa antibiotics; and Sulfonamide derivatives  Current Outpatient Prescriptions  Medication Sig Dispense Refill  . albuterol (VENTOLIN HFA) 108 (90 BASE) MCG/ACT inhaler INHALE TWO TO FOUR PUFFS BY MOUTH EVERY 6 HOURS AS NEEDED  18 g  1  . budesonide-formoterol (SYMBICORT) 160-4.5 MCG/ACT inhaler Inhale 2 puffs into the lungs 2 (two) times daily.  1 Inhaler  5  . cetirizine (ZYRTEC) 10 MG tablet Take 10 mg by mouth at bedtime.        . fluticasone (VERAMYST) 27.5 MCG/SPRAY nasal spray Place 2 sprays into the nose daily.  10 g  12  . ibuprofen (ADVIL,MOTRIN) 800 MG tablet Take 800 mg by mouth every 6 (six) hours as needed.       . metoprolol (LOPRESSOR) 100 MG tablet Take 1 tablet (100 mg total) by mouth 2 (two) times daily.  60 tablet  5  . ondansetron (ZOFRAN) 8 MG tablet Take 1 tablet (8 mg total) by mouth every 8 (eight) hours as needed for nausea or vomiting.  20 tablet  0  . oxyCODONE-acetaminophen (PERCOCET) 10-325 MG per tablet Take 1  tablet by mouth every 6 (six) hours as needed for pain.      [name] oxyCODONE-acetaminophen (PERCOCET) 5-325 MG per tablet Take 1-2 tablets by mouth every 6 (six) hours as needed for moderate pain or severe pain.  30 tablet  0  . PARoxetine (PAXIL) 10 MG tablet Take 1 tablet (10 mg total) by mouth every morning.  90 tablet  1  . prochlorperazine (COMPAZINE) 10 MG tablet Take 10 mg by mouth every 6 (six) hours as needed for nausea or vomiting.      . docusate sodium (STOOL SOFTENER) 100 MG capsule Take 100 mg by mouth.      . ondansetron (ZOFRAN-ODT) 4 MG disintegrating tablet Take 1 tablet (4 mg total) by mouth every 8 (eight) hours as needed for nausea.  20 tablet  0  . traZODone (DESYREL) 50 MG tablet TAKE ONE TABLET BY MOUTH AT BEDTIME  30 tablet  0   No current facility-administered medications for this encounter.     Physical Examination:  Filed Vitals:   11/19/13 0930  BP: 126/88  Pulse: 65  Temp: 97.8 F (36.6 C)    Wt Readings from Last  3 Encounters:  11/19/13 135 lb 12.8 oz (61.598 kg)  11/11/13 137 lb 9.6 oz (62.415 kg)  11/04/13 136 lb 1.6 oz (61.735 kg)     Lungs - Normal respiratory effort, chest expands symmetrically. Lungs are clear to auscultation, no crackles or wheezes.  Heart has regular rhythm and rate  Abdomen is soft and non tender with normal bowel sounds  Assessment:  Patient tolerating treatments well  Plan: Continue treatment per original radiation prescription

## 2013-11-20 ENCOUNTER — Ambulatory Visit: Payer: PRIVATE HEALTH INSURANCE

## 2013-11-21 ENCOUNTER — Ambulatory Visit
Admission: RE | Admit: 2013-11-21 | Discharge: 2013-11-21 | Disposition: A | Discharge status: Home / Self Care | Payer: PRIVATE HEALTH INSURANCE | Source: Ambulatory Visit | Attending: Radiation Oncology | Admitting: Radiation Oncology

## 2013-11-24 ENCOUNTER — Ambulatory Visit
Admission: RE | Admit: 2013-11-24 | Discharge: 2013-11-24 | Disposition: A | Discharge status: Home / Self Care | Payer: PRIVATE HEALTH INSURANCE | Source: Ambulatory Visit | Attending: Radiation Oncology | Admitting: Radiation Oncology

## 2013-11-25 ENCOUNTER — Ambulatory Visit
Admission: RE | Admit: 2013-11-25 | Payer: PRIVATE HEALTH INSURANCE | Source: Ambulatory Visit | Admitting: Radiation Oncology

## 2013-11-25 ENCOUNTER — Ambulatory Visit
Admission: RE | Admit: 2013-11-25 | Discharge: 2013-11-25 | Disposition: A | Discharge status: Home / Self Care | Payer: PRIVATE HEALTH INSURANCE | Source: Ambulatory Visit | Attending: Radiation Oncology | Admitting: Radiation Oncology

## 2013-11-26 ENCOUNTER — Encounter: Payer: Self-pay | Admitting: Radiation Oncology

## 2013-11-26 ENCOUNTER — Ambulatory Visit
Admission: RE | Admit: 2013-11-26 | Discharge: 2013-11-26 | Disposition: A | Discharge status: Home / Self Care | Payer: PRIVATE HEALTH INSURANCE | Source: Ambulatory Visit | Attending: Radiation Oncology | Admitting: Radiation Oncology

## 2013-11-26 ENCOUNTER — Ambulatory Visit: Payer: PRIVATE HEALTH INSURANCE

## 2013-11-26 VITALS — BP 118/82 | HR 71 | Temp 98.3°F | Resp 20 | Wt 135.1 lb

## 2013-11-26 DIAGNOSIS — C539 Malignant neoplasm of cervix uteri, unspecified: Secondary | ICD-10-CM

## 2013-11-26 NOTE — Progress Notes (Signed)
[facility]     Rexene Edison, M.D. [place], [place] [address]               Blair Promise, M.D., Ph.D. Phone: [phone]      [place] A. Tammi Klippel, M.D. Fax: [phone]      Jodelle Gross, M.D., Ph.D.         Thea Silversmith, M.D.         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  Current Dose: 46.8 Gy  Current Fraction: 26  Planned Dose: 50.4 Gy  Narrative: [name] was seen today for weekly treatment management. The chart was checked and MVCT  were reviewed. She is tolerating her treatments well at this time. She does have some fatigue but denies any bowel bladder issues or vaginal irritation. she does have headache today.  Penicillins; Sulfa antibiotics; and Sulfonamide derivatives  Current Outpatient Prescriptions  Medication Sig Dispense Refill  . albuterol (VENTOLIN HFA) 108 (90 BASE) MCG/ACT inhaler INHALE TWO TO FOUR PUFFS BY MOUTH EVERY 6 HOURS AS NEEDED  18 g  1  . budesonide-formoterol (SYMBICORT) 160-4.5 MCG/ACT inhaler Inhale 2 puffs into the lungs 2 (two) times daily.  1 Inhaler  5  . cetirizine (ZYRTEC) 10 MG tablet Take 10 mg by mouth at bedtime.        . docusate sodium (STOOL SOFTENER) 100 MG capsule Take 100 mg by mouth.      . fluticasone (VERAMYST) 27.5 MCG/SPRAY nasal spray Place 2 sprays into the nose daily.  10 g  12  . ibuprofen (ADVIL,MOTRIN) 800 MG tablet Take 800 mg by mouth every 6 (six) hours as needed.       . metoprolol (LOPRESSOR) 100 MG tablet Take 1 tablet (100 mg total) by mouth 2 (two) times daily.  60 tablet  5  . ondansetron (ZOFRAN) 8 MG tablet Take 1 tablet (8 mg total) by mouth every 8 (eight) hours as  needed for nausea or vomiting.  20 tablet  0  . ondansetron (ZOFRAN-ODT) 4 MG disintegrating tablet Take 1 tablet (4 mg total) by mouth every 8 (eight) hours as needed for nausea.  20 tablet  0  . oxyCODONE-acetaminophen (PERCOCET) 10-325 MG per tablet Take 1 tablet by mouth every 6 (six) hours as needed for pain.      [name] oxyCODONE-acetaminophen (PERCOCET) 5-325 MG per tablet Take 1-2 tablets by mouth every 6 (six) hours as needed for moderate pain or severe pain.  30 tablet  0  . PARoxetine (PAXIL) 10 MG tablet Take 1 tablet (10 mg total) by mouth every morning.  90 tablet  1  . prochlorperazine (COMPAZINE) 10 MG tablet Take 10 mg by mouth every 6 (six) hours as needed for nausea or vomiting.      . traZODone (DESYREL) 50 MG tablet TAKE ONE TABLET BY MOUTH AT BEDTIME  30 tablet  0   No current facility-administered medications for this encounter.   Labs:  Lab Results  Component Value Date   WBC 8.0 09/12/2013   HGB 9.7* 09/12/2013   HCT 28.9* 09/12/2013  MCV 96.7 09/12/2013   PLT 354 09/12/2013   Lab Results  Component Value Date   CREATININE 0.73 01/06/2013   BUN 10 01/06/2013   NA 138 01/06/2013   K 4.1 01/06/2013   CL 103 01/06/2013   CO2 28 01/06/2013   Lab Results  Component Value Date   ALT 13 01/06/2013   AST 19 01/06/2013   BILITOT 0.7 01/06/2013    Physical Examination:  Filed Vitals:   11/26/13 1614  BP: 118/82  Pulse: 71  Temp: 98.3 F (36.8 C)  Resp: 20    Wt Readings from Last 3 Encounters:  11/26/13 135 lb 1.6 oz (61.281 kg)  11/19/13 135 lb 12.8 oz (61.598 kg)  11/11/13 137 lb 9.6 oz (62.415 kg)     Lungs - Normal respiratory effort, chest expands symmetrically. Lungs are clear to auscultation, no crackles or wheezes.  Heart has regular rhythm and rate  Abdomen is soft and non tender with normal bowel sounds  Assessment:  Patient tolerating treatments well except for fatigue issues.  Plan: Continue treatment per original radiation prescription

## 2013-11-26 NOTE — Progress Notes (Addendum)
Pt c/o generalized headache today, states she "woke up with it". She took Percocet 1/2 tab w/fair relief. She is fatigued and reports "soreness of her abdomen". Pt takes Zofran often but not daily to control nausea. Pt denies bladder/bowel issues, vaginal discharge , skin irritation.

## 2013-11-27 ENCOUNTER — Ambulatory Visit
Admission: RE | Admit: 2013-11-27 | Discharge: 2013-11-27 | Disposition: A | Discharge status: Home / Self Care | Payer: PRIVATE HEALTH INSURANCE | Source: Ambulatory Visit | Attending: Radiation Oncology | Admitting: Radiation Oncology

## 2013-11-28 ENCOUNTER — Ambulatory Visit
Admission: RE | Admit: 2013-11-28 | Discharge: 2013-11-28 | Disposition: A | Discharge status: Home / Self Care | Payer: PRIVATE HEALTH INSURANCE | Source: Ambulatory Visit | Attending: Radiation Oncology | Admitting: Radiation Oncology

## 2013-11-28 ENCOUNTER — Ambulatory Visit: Payer: PRIVATE HEALTH INSURANCE

## 2013-12-01 ENCOUNTER — Encounter: Payer: Self-pay | Admitting: Radiation Oncology

## 2013-12-01 NOTE — Progress Notes (Signed)
  Radiation Oncology         [phone] ________________________________  Name: Anna Ramsey MRN: [id number]  Date: 12/01/2013  DOB: [birth year]  End of Treatment Note  Diagnosis:      Stage 1B-2 Adenocarcinoma of the Cervix   Indication for treatment:  Postop with risk for pelvic recurrence       Radiation treatment dates:   January 26 through March 6  Site/dose:   Pelvis 50.4 gray in 28 fractions  Beams/energy:   Helical intensity modulated radiation therapy, 6 MV photons  Narrative: The patient tolerated radiation treatment relatively well.   She did experience a significant amount of fatigue but no significant bowel or bladder issues  Plan: The patient has completed radiation treatment. The patient will return to radiation oncology clinic for routine followup in one month. I advised them to call or return sooner if they have any questions or concerns related to their recovery or treatment.  -----------------------------------  Blair Promise, PhD, MD

## 2013-12-05 ENCOUNTER — Encounter: Payer: Self-pay | Admitting: Gynecology

## 2013-12-05 ENCOUNTER — Encounter: Payer: Self-pay | Admitting: Gynecologic Oncology

## 2013-12-05 ENCOUNTER — Ambulatory Visit: Payer: PRIVATE HEALTH INSURANCE | Attending: Gynecology | Admitting: Gynecology

## 2013-12-05 ENCOUNTER — Other Ambulatory Visit: Payer: Self-pay | Admitting: Family Medicine

## 2013-12-05 ENCOUNTER — Other Ambulatory Visit: Payer: Self-pay | Admitting: Physician Assistant

## 2013-12-05 VITALS — BP 117/80 | HR 65 | Temp 98.0°F | Resp 16 | Wt 133.3 lb

## 2013-12-05 DIAGNOSIS — Z923 Personal history of irradiation: Secondary | ICD-10-CM | POA: Insufficient documentation

## 2013-12-05 DIAGNOSIS — Z90710 Acquired absence of both cervix and uterus: Secondary | ICD-10-CM | POA: Insufficient documentation

## 2013-12-05 DIAGNOSIS — I10 Essential (primary) hypertension: Secondary | ICD-10-CM | POA: Insufficient documentation

## 2013-12-05 DIAGNOSIS — Z79899 Other long term (current) drug therapy: Secondary | ICD-10-CM | POA: Insufficient documentation

## 2013-12-05 DIAGNOSIS — Z880 Allergy status to penicillin: Secondary | ICD-10-CM | POA: Insufficient documentation

## 2013-12-05 DIAGNOSIS — C539 Malignant neoplasm of cervix uteri, unspecified: Secondary | ICD-10-CM | POA: Insufficient documentation

## 2013-12-05 DIAGNOSIS — Z87891 Personal history of nicotine dependence: Secondary | ICD-10-CM | POA: Insufficient documentation

## 2013-12-05 MED ORDER — OXYCODONE-ACETAMINOPHEN 5-325 MG PO TABS: 1.0000 | ORAL_TABLET | Freq: Four times a day (QID) | ORAL | Status: DC | PRN

## 2013-12-05 NOTE — Progress Notes (Signed)
Consult Note: Gyn-Onc   Anna Ramsey 48 y.o. female  Chief Complaint  Patient presents with  . Cervical Cancer    Assessment :Stage IB2 adenocarcinoma of the cervix status post radical abdominal hysterectomy, BSO, pelvic lymphadenectomy. She had intermediate risk factors and received postoperative whole pelvis radiation therapy.  Plan:   The patient may return to work part-time basis in one week. She may resume other activities as she feels up to and including sexual intercourse. She returned to see me in 3 months. She's encouraged use astroglide for vaginal lubrication  Interval history:  The patient returns today as previously scheduled now having completed all pelvis radiation therapy. She tolerated the radiation therapy relatively well. She did not have any diarrhea. She does have some pelvic discomfort and in particular has significant fatigue. Her bladder sensation is diminished although she feels she empties adequately. She denies any evidence of lymphedema. Appetite is good she has no other GI or GU symptoms.   HPI:  48yo WMF seen in consultation at the request of Dr. [name] regarding management of a newly diagnosed adenocarcinoma of the cervix.   The patient has a remote history of CIN treated with cryotherapy as well as a LEEP procedure.  .  Since March, she has had a clear vaginal discharge that has become more profuse.    In June she had an AGUS pap smear.  Colposcopy in July was negative as was an ECC (path reviewed) An ultrasound in November 2014 shows a thickening.  A repeat ECC produced abundant tissue which was an adenocarcinoma.  A CT scan shows no adenopathy but an enlarged cervix mass measuring 5.1x4.4x5.2 cm.  There was also a simple 5 cm ovarian cyst. She underwent a radical hysterectomy, bilateral salpingo-oophorectomy, and pelvic lymphadenectomy on 09/05/2013. Final pathology showed a endocervical adenocarcinoma measuring 5 cm in diameter invading 1.85  cm of a total wall thickness of 2.0 cm. Lymph vascular space invasion was also noted. Surgical margins were clear and 29 lymph nodes were negative. The patient had an uncomplicated postoperative course.  Given the intermediate risk factors postoperative pelvic radiation was recommended. We'll pelvis radiation therapy was completed on 11/28/2013.  Review of Systems:10 point review of systems is negative except as noted in interval history.   Vitals: Blood pressure 117/80, pulse 65, temperature 98 F (36.7 C), temperature source Oral, resp. rate 16, weight 133 lb 4.8 oz (60.464 kg), last menstrual period 08/19/2013.  Physical Exam: General : The patient is a healthy woman in no acute distress.  HEENT: normocephalic, extraoccular movements normal; neck is supple without thyromegally  Lynphnodes: Supraclavicular and inguinal nodes not enlarged  Abdomen: Soft, non-tender, no ascites, no organomegally, no masses, no hernias Pfannenstiel incision and suprapubic incisions are healed Pelvic:  EGBUS: Normal female  Vagina: Normal, no lesions  Urethra and Bladder: Normal, non-tender  Cervix: Surgically absent Uterus:  Surgically absent Bi-manual examination: Non-tender; no adenxal masses or nodularity  Rectal: normal sphincter tone, no masses, no blood  Lower extremities: Normal without any varicosities or edema     Allergies  Allergen Reactions  . Penicillins Other (See Comments)    unknown  . Sulfa Antibiotics Other (See Comments)    Numbness in lower extremeties  . Sulfonamide Derivatives     Past Medical History  Diagnosis Date  . Asthma     hx in childhood  . Hypertension   . Seasonal allergies   . Cervical cancer     Past Surgical History  Procedure Laterality Date  . Foot surgery    . Radical abdominal hysterectomy  12/13/114  . Bilateral salpingoophorectomy  09/06/13    Current Outpatient Prescriptions  Medication Sig Dispense Refill  . albuterol (VENTOLIN HFA) 108  (90 BASE) MCG/ACT inhaler INHALE TWO TO FOUR PUFFS BY MOUTH EVERY 6 HOURS AS NEEDED  18 g  1  . budesonide-formoterol (SYMBICORT) 160-4.5 MCG/ACT inhaler Inhale 2 puffs into the lungs 2 (two) times daily.  1 Inhaler  5  . cetirizine (ZYRTEC) 10 MG tablet Take 10 mg by mouth at bedtime.        . docusate sodium (STOOL SOFTENER) 100 MG capsule Take 100 mg by mouth.      . fluticasone (VERAMYST) 27.5 MCG/SPRAY nasal spray Place 2 sprays into the nose daily.  10 g  12  . ibuprofen (ADVIL,MOTRIN) 800 MG tablet Take 800 mg by mouth every 6 (six) hours as needed.       . metoprolol (LOPRESSOR) 100 MG tablet Take 1 tablet (100 mg total) by mouth 2 (two) times daily.  60 tablet  5  . ondansetron (ZOFRAN) 8 MG tablet Take 1 tablet (8 mg total) by mouth every 8 (eight) hours as needed for nausea or vomiting.  20 tablet  0  . ondansetron (ZOFRAN-ODT) 4 MG disintegrating tablet Take 1 tablet (4 mg total) by mouth every 8 (eight) hours as needed for nausea.  20 tablet  0  . oxyCODONE-acetaminophen (PERCOCET) 10-325 MG per tablet Take 1 tablet by mouth every 6 (six) hours as needed for pain.      [name] oxyCODONE-acetaminophen (PERCOCET) 5-325 MG per tablet Take 1-2 tablets by mouth every 6 (six) hours as needed for moderate pain or severe pain.  30 tablet  0  . PARoxetine (PAXIL) 10 MG tablet Take 1 tablet (10 mg total) by mouth every morning.  90 tablet  1  . prochlorperazine (COMPAZINE) 10 MG tablet Take 10 mg by mouth every 6 (six) hours as needed for nausea or vomiting.      [name] scopolamine (TRANSDERM-SCOP) 1.5 MG Place 1.5 mg onto the skin.      [name] traZODone (DESYREL) 50 MG tablet TAKE ONE TABLET BY MOUTH AT BEDTIME  30 tablet  0   No current facility-administered medications for this visit.    History   Social History  . Marital Status: Married    Spouse Name: N/A    Number of Children: 1  . Years of Education: N/A   Occupational History  .     Social History Main Topics  . Smoking status: Former  Smoker    Quit date: 09/25/2004  . Smokeless tobacco: Not on file  . Alcohol Use: 0.5 oz/week    1 drink(s) per week  . Drug Use: No  . Sexual Activity: Yes   Other Topics Concern  . Not on file   Social History Narrative  . No narrative on file    Family History  Problem Relation Age of Onset  . Heart attack Father 58  . Diabetes Father   . Hypertension Maternal Grandmother   . Stroke Maternal Grandmother   . Cancer Mother     breast/thyroid      Alvino Chapel, MD 12/05/2013, 10:56 AM

## 2013-12-05 NOTE — Patient Instructions (Signed)
Plan to follow up in three months or sooner if needed.  Please call for any questions or concerns. 

## 2013-12-08 ENCOUNTER — Telehealth: Payer: Self-pay | Admitting: Oncology

## 2013-12-08 NOTE — Telephone Encounter (Signed)
Called in refill per Dr. Sondra Come to [facility] for prochlorperazine (COMPAZINE) 10 MG tablet Sig - Route: Take 10 mg by mouth every 6 (six) hours as needed for nausea or vomiting.  Disp. 30 tablets. 1 Refill.  Called Nanette to let her know the refill has been called in.

## 2014-01-02 ENCOUNTER — Encounter: Payer: Self-pay | Admitting: Oncology

## 2014-01-05 ENCOUNTER — Ambulatory Visit
Admission: RE | Admit: 2014-01-05 | Discharge: 2014-01-05 | Disposition: A | Discharge status: Home / Self Care | Payer: PRIVATE HEALTH INSURANCE | Source: Ambulatory Visit | Attending: Radiation Oncology | Admitting: Radiation Oncology

## 2014-01-05 ENCOUNTER — Encounter: Payer: Self-pay | Admitting: Radiation Oncology

## 2014-01-05 VITALS — BP 124/73 | HR 68 | Temp 97.8°F | Ht 61.0 in | Wt 135.7 lb

## 2014-01-05 DIAGNOSIS — C539 Malignant neoplasm of cervix uteri, unspecified: Secondary | ICD-10-CM

## 2014-01-05 NOTE — Progress Notes (Signed)
Gave patient a small and medium dilator and instructed her to insert into vaginal vault 3 times weekly by first lubricating dilator with water soluble lubricant , inserting as far as comfortable and leave in for 10 minutes.Clean with hot water and soap after each use.Patient able to verbalize back why she must do this procedure.

## 2014-01-05 NOTE — Progress Notes (Signed)
Radiation Oncology         [phone] ________________________________  Name: Anna Ramsey MRN: [id number]  Date: 01/05/2014  DOB: [birth year]  Follow-Up Visit Note  CC: METHENEY,CATHERINE, MD  Marti Sleigh *  Diagnosis:  Stage 1B-2 Adenocarcinoma of the Cervix   Interval Since Last Radiation:  1  months  Narrative:  The patient returns today for routine follow-up.  She is doing well this time. She continues to have some mild fatigue but has been back to work full-time over the past 2 weeks and tolerated this well. She denies any problems with diarrhea or rectal irritation. She denies any vaginal bleeding. She has had some mild nausea 1-2 hours after eating,  this is a recent problem for her. She denies any severe cramping with his nausea.                              ALLERGIES:  is allergic to penicillins; sulfa antibiotics; and sulfonamide derivatives.  Meds: Current Outpatient Prescriptions  Medication Sig Dispense Refill  . albuterol (VENTOLIN HFA) 108 (90 BASE) MCG/ACT inhaler INHALE TWO TO FOUR PUFFS BY MOUTH EVERY 6 HOURS AS NEEDED  18 g  1  . budesonide-formoterol (SYMBICORT) 160-4.5 MCG/ACT inhaler Inhale 2 puffs into the lungs 2 (two) times daily.  1 Inhaler  5  . cetirizine (ZYRTEC) 10 MG tablet Take 10 mg by mouth at bedtime.        . fluticasone (VERAMYST) 27.5 MCG/SPRAY nasal spray Place 2 sprays into the nose daily.  10 g  12  . ibuprofen (ADVIL,MOTRIN) 800 MG tablet Take 800 mg by mouth every 6 (six) hours as needed.       . metoprolol (LOPRESSOR) 100 MG tablet Take 1 tablet (100 mg total) by mouth 2 (two) times daily.  60 tablet  5  . oxyCODONE-acetaminophen (PERCOCET) 5-325 MG per tablet Take 1-2 tablets by mouth every 6 (six) hours as needed for moderate pain or severe pain.  15 tablet  0  . PARoxetine (PAXIL) 10 MG tablet TAKE ONE TABLET BY MOUTH ONCE DAILY IN THE MORNING  90 tablet  0  . prochlorperazine (COMPAZINE) 10 MG tablet Take 10 mg by  mouth every 6 (six) hours as needed for nausea or vomiting.      . traZODone (DESYREL) 50 MG tablet TAKE ONE TABLET BY MOUTH AT BEDTIME  30 tablet  0  . docusate sodium (STOOL SOFTENER) 100 MG capsule Take 100 mg by mouth.      . ondansetron (ZOFRAN) 8 MG tablet Take 1 tablet (8 mg total) by mouth every 8 (eight) hours as needed for nausea or vomiting.  20 tablet  0  . ondansetron (ZOFRAN-ODT) 4 MG disintegrating tablet Take 1 tablet (4 mg total) by mouth every 8 (eight) hours as needed for nausea.  20 tablet  0   No current facility-administered medications for this encounter.    Physical Findings: The patient is in no acute distress. Patient is alert and oriented.  height is 5\' 1"  (1.549 m) and weight is 135 lb 11.2 oz (61.553 kg). Her temperature is 97.8 F (36.6 C). Her blood pressure is 124/73 and her pulse is 68. [name]  No palpable supraclavicular or axillary adenopathy. The lungs are clear to auscultation. The heart has regular rhythm and rate. The abdomen is soft and nontender with normal bowel sounds. A pelvic exam is not performed in light of the patient's recent  completion of treatment.  Lab Findings: Lab Results  Component Value Date   WBC 8.0 09/12/2013   HGB 9.7* 09/12/2013   HCT 28.9* 09/12/2013   MCV 96.7 09/12/2013   PLT 354 09/12/2013     Radiographic Findings: No results found.  Impression:  The patient is recovering from the effects of radiation. She will let us know if nausea continues to be a problem for her.  Plan:  Routine followup in September. Patient will be seen by gynecologic oncology in June for her first Pap smear after treatment.  Today the patient was given a vaginal dilator and instructions on its use in light of her pelvic radiation therapy.  ____________________________________ Blair Promise, MD

## 2014-01-05 NOTE — Progress Notes (Signed)
Anna Ramsey here for follow up after treatment to her pelvis.  She denies pain.  She reports fatigue.  She reports having nausea about an hour after she eats.  She has been taking compazine for this.  She also reports having gas.  She denies vaginal/rectal bleeding, pelvic cramping and diarrhea.

## 2014-01-13 ENCOUNTER — Telehealth: Payer: Self-pay | Admitting: Oncology

## 2014-01-13 NOTE — Telephone Encounter (Signed)
Anna Ramsey called and left a message stating that she thinks she has a cracked tailbone.  She said she did not fall and the pain started on Saturday.  She reports that the pain is a 7/10 and ibuprofen 800 mg is not helping with the pain.  She reports trouble sitting and walking.  She also said she continues to have nausea when eating.  Notified Dr. Sondra Come and we can get her in to be seen this week.  Called Anna Ramsey back and transferred her to Santiago Glad, Art therapist to schedule an appointment.

## 2014-01-14 ENCOUNTER — Telehealth: Payer: Self-pay | Admitting: Oncology

## 2014-01-14 ENCOUNTER — Ambulatory Visit
Admission: RE | Admit: 2014-01-14 | Discharge: 2014-01-14 | Disposition: A | Discharge status: Home / Self Care | Payer: PRIVATE HEALTH INSURANCE | Source: Ambulatory Visit | Attending: Radiation Oncology | Admitting: Radiation Oncology

## 2014-01-14 ENCOUNTER — Ambulatory Visit (HOSPITAL_COMMUNITY)
Admission: RE | Admit: 2014-01-14 | Discharge: 2014-01-14 | Disposition: A | Discharge status: Home / Self Care | Payer: PRIVATE HEALTH INSURANCE | Source: Ambulatory Visit | Attending: Radiation Oncology | Admitting: Radiation Oncology

## 2014-01-14 ENCOUNTER — Encounter: Payer: Self-pay | Admitting: Radiation Oncology

## 2014-01-14 VITALS — BP 137/89 | HR 69 | Temp 97.6°F | Ht 61.0 in | Wt 135.4 lb

## 2014-01-14 DIAGNOSIS — M533 Sacrococcygeal disorders, not elsewhere classified: Secondary | ICD-10-CM | POA: Insufficient documentation

## 2014-01-14 DIAGNOSIS — C539 Malignant neoplasm of cervix uteri, unspecified: Secondary | ICD-10-CM

## 2014-01-14 MED ORDER — OXYCODONE-ACETAMINOPHEN 5-325 MG PO TABS: 1.0000 | ORAL_TABLET | ORAL | Status: DC | PRN

## 2014-01-14 NOTE — Progress Notes (Signed)
Radiation Oncology         [phone] ________________________________  Name: Anna Ramsey MRN: [id number]  Date: 01/14/2014  DOB: 07/17/1966  Follow-Up Visit Note  CC: METHENEY,CATHERINE, MD  Marti Sleigh *  Diagnosis:   Stage 1B-2 Adenocarcinoma of the Cervix   Interval Since Last Radiation:  7 weeks  Narrative:  The patient was to be seen earlier than her scheduled followup appointment. Saturday the patient  developed significant pain in the sacral coccygeal area. She denies any, to this area recently or falling. She denies any unusual physical activity. Patient does report "cracking her tailbone" in the distant past while playing roller hockey. She continues to have intermittent episodes of nausea. She denies any vaginal bleeding or discharge. She denies any rectal bleeding, bowel changes or bladder issues. She has difficulty sitting and lying completely flat. Her pain is worsened if anything since Saturday.                 ALLERGIES:  is allergic to penicillins; sulfa antibiotics; and sulfonamide derivatives.  Meds: Current Outpatient Prescriptions  Medication Sig Dispense Refill  . albuterol (VENTOLIN HFA) 108 (90 BASE) MCG/ACT inhaler INHALE TWO TO FOUR PUFFS BY MOUTH EVERY 6 HOURS AS NEEDED  18 g  1  . cetirizine (ZYRTEC) 10 MG tablet Take 10 mg by mouth at bedtime.        . fluticasone (VERAMYST) 27.5 MCG/SPRAY nasal spray Place 2 sprays into the nose daily.  10 g  12  . ibuprofen (ADVIL,MOTRIN) 800 MG tablet Take 800 mg by mouth every 6 (six) hours as needed.       . metoprolol (LOPRESSOR) 100 MG tablet Take 1 tablet (100 mg total) by mouth 2 (two) times daily.  60 tablet  5  . oxyCODONE-acetaminophen (PERCOCET) 5-325 MG per tablet Take 1 tablet by mouth every 4 (four) hours as needed for moderate pain or severe pain.  30 tablet  0  . PARoxetine (PAXIL) 10 MG tablet TAKE ONE TABLET BY MOUTH ONCE DAILY IN THE MORNING  90 tablet  0  . prochlorperazine  (COMPAZINE) 10 MG tablet Take 10 mg by mouth every 6 (six) hours as needed for nausea or vomiting.      . traZODone (DESYREL) 50 MG tablet TAKE ONE TABLET BY MOUTH AT BEDTIME  30 tablet  0  . budesonide-formoterol (SYMBICORT) 160-4.5 MCG/ACT inhaler Inhale 2 puffs into the lungs 2 (two) times daily.  1 Inhaler  5  . docusate sodium (STOOL SOFTENER) 100 MG capsule Take 100 mg by mouth.      . ondansetron (ZOFRAN) 8 MG tablet Take 1 tablet (8 mg total) by mouth every 8 (eight) hours as needed for nausea or vomiting.  20 tablet  0  . ondansetron (ZOFRAN-ODT) 4 MG disintegrating tablet Take 1 tablet (4 mg total) by mouth every 8 (eight) hours as needed for nausea.  20 tablet  0   No current facility-administered medications for this encounter.    Physical Findings: The patient is in no acute distress. Patient is alert and oriented.  height is 5\' 1"  (1.549 m) and weight is 135 lb 6.4 oz (61.417 kg). Her temperature is 97.6 F (36.4 C). Her blood pressure is 137/89 and her pulse is 69. .  The patient is exquisitely tender along the coccyx region. No obvious swelling noted or skin reaction in this region. Lower motor strength is 5 out of 5 in the proximal and distal muscle groups.  Lab Findings:  Lab Results  Component Value Date   WBC 8.0 09/12/2013   HGB 9.7* 09/12/2013   HCT 28.9* 09/12/2013   MCV 96.7 09/12/2013   PLT 354 09/12/2013      Radiographic Findings: No results found.  Impression:  Coccyx pain of unknown etiology patient will undergo imaging of this area later today. She was given a limited prescription for Percocet in light of her pain. She will also be placed on a Medrol Dosepak.  Plan:  She will keep her already scheduled followup appointment with gynecologic oncology and radiation oncology. Additional imaging studies may be indicated depending on results of x-rays today.  ____________________________________ Blair Promise, MD

## 2014-01-14 NOTE — Progress Notes (Signed)
Anna Ramsey here for follow up after treatment to her pelvis.  She reports pain in her tailbone 8/10 that started on Saturday and has been getting worse.  She has taken ibuprofen 800 mg which does not help.  She took 1 percocet yesterday which did help.  She denies falling.  She reports trouble walking.  She also reports nausea after eating and tries to put off eating as long as possible.  She has compazine which does not help.  She reports urinary frequency.  She denies hematuria and rectal bleeding.

## 2014-01-14 NOTE — Telephone Encounter (Signed)
Called Anna Ramsey and told her that her x ray was good without signs of a fracture or cancer per Dr. Sondra Come.  She verbalized agreement.

## 2014-01-14 NOTE — Telephone Encounter (Signed)
Called in prescription for a medrol dose pak to [facility] in [place] per Dr. Sondra Come.  Notified Kielee that it had been called in.

## 2014-02-18 ENCOUNTER — Other Ambulatory Visit: Payer: Self-pay | Admitting: Family Medicine

## 2014-03-04 ENCOUNTER — Other Ambulatory Visit: Payer: Self-pay | Admitting: Physician Assistant

## 2014-03-06 ENCOUNTER — Ambulatory Visit: Payer: PRIVATE HEALTH INSURANCE | Attending: Gynecology | Admitting: Gynecology

## 2014-03-06 ENCOUNTER — Encounter: Payer: Self-pay | Admitting: Gynecology

## 2014-03-06 ENCOUNTER — Other Ambulatory Visit (HOSPITAL_COMMUNITY)
Admission: RE | Admit: 2014-03-06 | Discharge: 2014-03-06 | Disposition: A | Discharge status: Home / Self Care | Payer: PRIVATE HEALTH INSURANCE | Source: Ambulatory Visit | Attending: Gynecology | Admitting: Gynecology

## 2014-03-06 VITALS — BP 138/97 | HR 88 | Temp 98.3°F | Resp 20 | Ht 61.65 in | Wt 137.1 lb

## 2014-03-06 DIAGNOSIS — Z90710 Acquired absence of both cervix and uterus: Secondary | ICD-10-CM

## 2014-03-06 DIAGNOSIS — Z01419 Encounter for gynecological examination (general) (routine) without abnormal findings: Secondary | ICD-10-CM | POA: Insufficient documentation

## 2014-03-06 DIAGNOSIS — Z8541 Personal history of malignant neoplasm of cervix uteri: Secondary | ICD-10-CM

## 2014-03-06 DIAGNOSIS — Z87891 Personal history of nicotine dependence: Secondary | ICD-10-CM | POA: Insufficient documentation

## 2014-03-06 DIAGNOSIS — J45909 Unspecified asthma, uncomplicated: Secondary | ICD-10-CM | POA: Insufficient documentation

## 2014-03-06 DIAGNOSIS — I10 Essential (primary) hypertension: Secondary | ICD-10-CM | POA: Insufficient documentation

## 2014-03-06 DIAGNOSIS — Z79899 Other long term (current) drug therapy: Secondary | ICD-10-CM | POA: Insufficient documentation

## 2014-03-06 DIAGNOSIS — C539 Malignant neoplasm of cervix uteri, unspecified: Secondary | ICD-10-CM | POA: Insufficient documentation

## 2014-03-06 NOTE — Progress Notes (Signed)
Consult Note: Gyn-Onc   Anna Ramsey 48 y.o. female  Chief Complaint  Patient presents with  . 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 Clinically free of disease.  Plan:    Pap smear is obtained She returned to see me in 3 months. She's encouraged to use ibuprofen her lower nominal discomfort.  Interval history:  The patient returns today as previously scheduled . Since her last visit she's done well. She been working full time. She reports her bladder is working well although sensation is somewhat diminished. Appetite is good she has no other GI or GU symptoms. She does note some persistent lower abdominal discomfort. She's not using any pain medication for that. She denies any pelvic pain pressure vaginal bleeding or discharge.   HPI:  48yo WMF seen in consultation at the request of Dr. [name] regarding management of a newly diagnosed adenocarcinoma of the cervix.   The patient has a remote history of CIN treated with cryotherapy as well as a LEEP procedure.  .  Since March 2014, she has had a clear vaginal discharge that has become more profuse.    In June she had an AGUS pap smear.  Colposcopy in July was negative as was an ECC (path reviewed) An ultrasound in November 2014 shows a thickening.  A repeat ECC produced abundant tissue which was an adenocarcinoma.  A CT scan shows no adenopathy but an enlarged cervix mass measuring 5.1x4.4x5.2 cm.  There was also a simple 5 cm ovarian cyst. She underwent a radical hysterectomy, bilateral salpingo-oophorectomy, and pelvic lymphadenectomy on 09/05/2013. Final pathology showed a endocervical adenocarcinoma measuring 5 cm in diameter invading 1.85 cm of a total wall thickness of 2.0 cm. Lymph vascular space invasion was also noted. Surgical margins were clear and 29 lymph nodes were negative. The patient had an  uncomplicated postoperative course.  Given the intermediate risk factors postoperative pelvic radiation was recommended. Whole pelvis radiation therapy was completed on 11/28/2013.  Review of Systems:10 point review of systems is negative except as noted in interval history.   Vitals: Blood pressure 138/97, pulse 88, temperature 98.3 F (36.8 C), temperature source Oral, resp. rate 20, height 5' 1.65" (1.566 m), weight 137 lb 1.6 oz (62.188 kg), last menstrual period 08/19/2013.  Physical Exam: General : The patient is a healthy woman in no acute distress.  HEENT: normocephalic, extraoccular movements normal; neck is supple without thyromegally  Lynphnodes: Supraclavicular and inguinal nodes not enlarged  Abdomen: Soft, non-tender, no ascites, no organomegally, no masses, no hernias Pfannenstiel incision and suprapubic incisions are healed Pelvic:  EGBUS: Normal female  Vagina: Normal, no lesions  Urethra and Bladder: Normal, non-tender  Cervix: Surgically absent Uterus:  Surgically absent Bi-manual examination: Non-tender; no adenxal masses or nodularity  Rectal: normal sphincter tone, no masses, no blood  Lower extremities: Normal without any varicosities or edema     Allergies  Allergen Reactions  . Penicillins Other (See Comments)    unknown  . Sulfa Antibiotics Other (See Comments)    Numbness in lower extremeties  . Sulfonamide Derivatives     Past Medical History  Diagnosis Date  . Asthma     hx in childhood  . Hypertension   . Seasonal allergies   . Cervical cancer   . History of radiation therapy 10/20/13-11/28/13    pelvis 50.4 gray    Past Surgical History  Procedure Laterality Date  . Foot surgery    . Radical abdominal hysterectomy  12/13/114  . Bilateral salpingoophorectomy  09/06/13    Current Outpatient Prescriptions  Medication Sig Dispense Refill  . albuterol (VENTOLIN HFA) 108 (90 BASE) MCG/ACT inhaler INHALE TWO TO FOUR PUFFS BY MOUTH EVERY 6  HOURS AS NEEDED  18 g  1  . cetirizine (ZYRTEC) 10 MG tablet Take 10 mg by mouth at bedtime.        . fluticasone (VERAMYST) 27.5 MCG/SPRAY nasal spray Place 2 sprays into the nose daily.  10 g  12  . ibuprofen (ADVIL,MOTRIN) 800 MG tablet Take 800 mg by mouth every 6 (six) hours as needed.       . methylPREDNISolone (MEDROL DOSEPAK) 4 MG tablet Take by mouth. follow package directions      . metoprolol (LOPRESSOR) 100 MG tablet Take 1 tablet (100 mg total) by mouth 2 (two) times daily.  60 tablet  5  . ondansetron (ZOFRAN) 8 MG tablet Take 1 tablet (8 mg total) by mouth every 8 (eight) hours as needed for nausea or vomiting.  20 tablet  0  . ondansetron (ZOFRAN-ODT) 4 MG disintegrating tablet Take 1 tablet (4 mg total) by mouth every 8 (eight) hours as needed for nausea.  20 tablet  0  . oxyCODONE-acetaminophen (PERCOCET) 5-325 MG per tablet Take 1 tablet by mouth every 4 (four) hours as needed for moderate pain or severe pain.  30 tablet  0  . PARoxetine (PAXIL) 10 MG tablet TAKE ONE TABLET BY MOUTH ONCE DAILY IN THE MORNING  90 tablet  0  . prochlorperazine (COMPAZINE) 10 MG tablet Take 10 mg by mouth every 6 (six) hours as needed for nausea or vomiting.      . SYMBICORT 160-4.5 MCG/ACT inhaler INHALE TWO PUFFS INTO LUNGS TWICE DAILY  1 Inhaler  0  . traZODone (DESYREL) 50 MG tablet Take 1 tablet (50 mg total) by mouth at bedtime. APPOINTMENT REQUIRED FOR FUTURE REFILLS  30 tablet  0  . docusate sodium (STOOL SOFTENER) 100 MG capsule Take 100 mg by mouth.       No current facility-administered medications for this visit.    History   Social History  . Marital Status: Married    Spouse Name: N/A    Number of Children: 1  . Years of Education: N/A   Occupational History  .     Social History Main Topics  . Smoking status: Former Smoker    Quit date: 09/25/2004  . Smokeless tobacco: Not on file  . Alcohol Use: 0.5 oz/week    1 drink(s) per week  . Drug Use: No  . Sexual Activity:  Yes   Other Topics Concern  . Not on file   Social History Narrative  . No narrative on file    Family History  Problem Relation Age of Onset  . Heart attack Father 60  . Diabetes Father   . Hypertension Maternal Grandmother   . Stroke Maternal Grandmother   . Cancer Mother     breast/thyroid      Alvino Chapel, MD 03/06/2014, 11:55 AM

## 2014-03-06 NOTE — Addendum Note (Signed)
Addended by: Lucile Crater on: 03/06/2014 12:10 PM   Modules accepted: Orders

## 2014-03-06 NOTE — Patient Instructions (Signed)
Return to see me in 3 months. 

## 2014-03-11 LAB — CYTOLOGY - PAP

## 2014-03-12 NOTE — Progress Notes (Signed)
Quick Note:  Call patient: Your Pap smear is normal. Repeat in 2-3 years. ______ 

## 2014-03-19 ENCOUNTER — Other Ambulatory Visit: Payer: Self-pay | Admitting: Family Medicine

## 2014-04-07 ENCOUNTER — Encounter: Payer: Self-pay | Admitting: Family Medicine

## 2014-04-07 ENCOUNTER — Ambulatory Visit (INDEPENDENT_AMBULATORY_CARE_PROVIDER_SITE_OTHER): Payer: PRIVATE HEALTH INSURANCE | Admitting: Family Medicine

## 2014-04-07 VITALS — BP 114/74 | HR 73 | Ht 61.6 in | Wt 139.0 lb

## 2014-04-07 DIAGNOSIS — R5381 Other malaise: Secondary | ICD-10-CM

## 2014-04-07 DIAGNOSIS — F411 Generalized anxiety disorder: Secondary | ICD-10-CM

## 2014-04-07 DIAGNOSIS — I10 Essential (primary) hypertension: Secondary | ICD-10-CM

## 2014-04-07 DIAGNOSIS — R5383 Other fatigue: Secondary | ICD-10-CM

## 2014-04-07 MED ORDER — FLUTICASONE FUROATE 27.5 MCG/SPRAY NA SUSP: 2.0000 | Freq: Every day | NASAL | Status: DC

## 2014-04-07 MED ORDER — PAROXETINE HCL 20 MG PO TABS: ORAL_TABLET | ORAL | Status: DC

## 2014-04-07 MED ORDER — TRAZODONE HCL 50 MG PO TABS: 50.0000 mg | ORAL_TABLET | Freq: Every day | ORAL | Status: DC

## 2014-04-07 NOTE — Progress Notes (Signed)
Subjective:    Patient ID: Anna Ramsey, female    DOB: 1966-06-16, 48 y.o.   MRN: [id number]  HPI Hypertension- Pt denies chest pain, SOB, dizziness, or heart palpitations.  Taking meds as directed w/o problems.  Denies medication side effects.    Generalized anxiety disorder-she complains of feeling nervous and on edge several days a week as well as feeling like she's worrying too much and having trouble relaxing. She also admits to being easily annoyed and irritable several days a week.  Been very fatigued.  Says really tired.  NO URI sxs recently.  Says hard to get out of bed. Started after receiving her chemo for adenocarcinoma stage 1   Review of Systems BP 114/74  Pulse 73  Ht 5' 1.6" (1.565 m)  Wt 139 lb (63.05 kg)  BMI 25.74 kg/m2  LMP 08/19/2013    Allergies  Allergen Reactions  . Penicillins Other (See Comments)    unknown  . Sulfa Antibiotics Other (See Comments)    Numbness in lower extremeties  . Sulfonamide Derivatives     Past Medical History  Diagnosis Date  . Asthma     hx in childhood  . Hypertension   . Seasonal allergies   . Cervical cancer   . History of radiation therapy 10/20/13-11/28/13    pelvis 50.4 gray    Past Surgical History  Procedure Laterality Date  . Foot surgery    . Radical abdominal hysterectomy  12/13/114  . Bilateral salpingoophorectomy  09/06/13    History   Social History  . Marital Status: Married    Spouse Name: N/A    Number of Children: 1  . Years of Education: N/A   Occupational History  .     Social History Main Topics  . Smoking status: Former Smoker    Quit date: 09/25/2004  . Smokeless tobacco: Not on file  . Alcohol Use: 0.5 oz/week    1 drink(s) per week  . Drug Use: No  . Sexual Activity: Yes   Other Topics Concern  . Not on file   Social History Narrative  . No narrative on file    Family History  Problem Relation Age of Onset  . Heart attack Father 35  . Diabetes Father   .  Hypertension Maternal Grandmother   . Stroke Maternal Grandmother   . Cancer Mother     breast/thyroid    Outpatient Encounter Prescriptions as of 04/07/2014  Medication Sig  . albuterol (VENTOLIN HFA) 108 (90 BASE) MCG/ACT inhaler INHALE TWO TO FOUR PUFFS BY MOUTH EVERY 6 HOURS AS NEEDED  . cetirizine (ZYRTEC) 10 MG tablet Take 10 mg by mouth at bedtime.    . docusate sodium (STOOL SOFTENER) 100 MG capsule Take 100 mg by mouth.  . fluticasone (VERAMYST) 27.5 MCG/SPRAY nasal spray Place 2 sprays into the nose daily.  [name] ibuprofen (ADVIL,MOTRIN) 800 MG tablet Take 800 mg by mouth every 6 (six) hours as needed.   . metoprolol (LOPRESSOR) 100 MG tablet TAKE ONE TABLET BY MOUTH TWICE DAILY   . oxyCODONE-acetaminophen (PERCOCET) 5-325 MG per tablet Take 1 tablet by mouth every 4 (four) hours as needed for moderate pain or severe pain.  [name] PARoxetine (PAXIL) 20 MG tablet TAKE ONE TABLET BY MOUTH ONCE DAILY IN THE MORNING  . prochlorperazine (COMPAZINE) 10 MG tablet Take 10 mg by mouth every 6 (six) hours as needed for nausea or vomiting.  . SYMBICORT 160-4.5 MCG/ACT inhaler INHALE TWO PUFFS INTO LUNGS  TWICE DAILY  . traZODone (DESYREL) 50 MG tablet Take 1 tablet (50 mg total) by mouth at bedtime.  . [DISCONTINUED] fluticasone (VERAMYST) 27.5 MCG/SPRAY nasal spray Place 2 sprays into the nose daily.  . [DISCONTINUED] PARoxetine (PAXIL) 10 MG tablet TAKE ONE TABLET BY MOUTH ONCE DAILY IN THE MORNING  . [DISCONTINUED] traZODone (DESYREL) 50 MG tablet Take 1 tablet (50 mg total) by mouth at bedtime. APPOINTMENT REQUIRED FOR FUTURE REFILLS  . [DISCONTINUED] methylPREDNISolone (MEDROL DOSEPAK) 4 MG tablet Take by mouth. follow package directions  . [DISCONTINUED] ondansetron (ZOFRAN) 8 MG tablet Take 1 tablet (8 mg total) by mouth every 8 (eight) hours as needed for nausea or vomiting.  . [DISCONTINUED] ondansetron (ZOFRAN-ODT) 4 MG disintegrating tablet Take 1 tablet (4 mg total) by mouth every 8 (eight)  hours as needed for nausea.          Objective:   Physical Exam  Constitutional: She is oriented to person, place, and time. She appears well-developed and well-nourished.  HENT:  Head: Normocephalic and atraumatic.  Right Ear: External ear normal.  Left Ear: External ear normal.  Nose: Nose normal.  Mouth/Throat: Oropharynx is clear and moist.  TMs and canals are clear.   Eyes: Conjunctivae and EOM are normal. Pupils are equal, round, and reactive to light.  Neck: Neck supple. No thyromegaly present.  Cardiovascular: Normal rate, regular rhythm and normal heart sounds.   Pulmonary/Chest: Effort normal and breath sounds normal. She has no wheezes.  Lymphadenopathy:    She has no cervical adenopathy.  Neurological: She is alert and oriented to person, place, and time.  Skin: Skin is warm and dry.  Psychiatric: She has a normal mood and affect. Her behavior is normal.          Assessment & Plan:  Hypertension-well-controlled current regimen. Followup in 6 months. Due for CMP and fasting lipid panel. Followup in 6 months.  Generalized anxiety disorder-gad 7 score of 5 today. She rates her symptoms as somewhat difficult. The stricture into the mild category. We'll increase her Paxil to 20 mg. Hopefully this will improve her emotional state. I like to see her back in about 2-3 months to make sure that she's doing well with the new regimen.  Fatigue - may be related somewhat to her chemotherapy and recent diagnosis and stressed that comes with that. If she is normally very physically active person. I think at this point we will focus on really try to control her anxiety by adjusting her dose on her Paxil. We'll also do some additional blood work including a CBC, evaluating for anemia and deficiencies as well as checking her thyroid.

## 2014-04-08 ENCOUNTER — Other Ambulatory Visit: Payer: Self-pay | Admitting: *Deleted

## 2014-04-08 DIAGNOSIS — E870 Hyperosmolality and hypernatremia: Secondary | ICD-10-CM

## 2014-04-08 LAB — LIPID PANEL
CHOLESTEROL: 212 mg/dL — AB (ref 0–200)
HDL: 67 mg/dL (ref >39)
LDL CALC: 125 mg/dL — AB (ref 0–99)
Total CHOL/HDL Ratio: 3.2 Ratio
Triglycerides: 102 mg/dL (ref <150)
VLDL: 20 mg/dL (ref 0–40)

## 2014-04-08 LAB — CBC WITH DIFFERENTIAL/PLATELET
BASOS ABS: 0 10*3/uL (ref 0.0–0.1)
Basophils Relative: 1 % (ref 0–1)
Eosinophils Absolute: 0.2 10*3/uL (ref 0.0–0.7)
Eosinophils Relative: 5 % (ref 0–5)
HCT: 44.8 % (ref 36.0–46.0)
Hemoglobin: 15 g/dL (ref 12.0–15.0)
LYMPHS PCT: 14 % (ref 12–46)
Lymphs Abs: 0.6 10*3/uL — ABNORMAL LOW (ref 0.7–4.0)
MCH: 32.3 pg (ref 26.0–34.0)
MCHC: 33.5 g/dL (ref 30.0–36.0)
MCV: 96.6 fL (ref 78.0–100.0)
MONOS PCT: 12 % (ref 3–12)
Monocytes Absolute: 0.5 10*3/uL (ref 0.1–1.0)
NEUTROS ABS: 2.8 10*3/uL (ref 1.7–7.7)
Neutrophils Relative %: 68 % (ref 43–77)
Platelets: 292 10*3/uL (ref 150–400)
RBC: 4.64 MIL/uL (ref 3.87–5.11)
RDW: 13.7 % (ref 11.5–15.5)
WBC: 4.1 10*3/uL (ref 4.0–10.5)

## 2014-04-08 LAB — COMPLETE METABOLIC PANEL WITH GFR
ALK PHOS: 109 U/L (ref 39–117)
ALT: 15 U/L (ref 0–35)
AST: 27 U/L (ref 0–37)
Albumin: 4.5 g/dL (ref 3.5–5.2)
BILIRUBIN TOTAL: 0.5 mg/dL (ref 0.2–1.2)
BUN: 11 mg/dL (ref 6–23)
CO2: 24 mEq/L (ref 19–32)
CREATININE: 0.66 mg/dL (ref 0.50–1.10)
Calcium: 9.4 mg/dL (ref 8.4–10.5)
Chloride: 104 mEq/L (ref 96–112)
GFR, Est African American: >89 mL/min
GFR, Est Non African American: >89 mL/min
Glucose, Bld: 88 mg/dL (ref 70–99)
Potassium: 5.8 mEq/L — ABNORMAL HIGH (ref 3.5–5.3)
Sodium: 140 mEq/L (ref 135–145)
Total Protein: 7.1 g/dL (ref 6.0–8.3)

## 2014-04-08 LAB — VITAMIN B12: Vitamin B-12: 398 pg/mL (ref 211–911)

## 2014-04-08 LAB — VITAMIN D 25 HYDROXY (VIT D DEFICIENCY, FRACTURES): Vit D, 25-Hydroxy: 89 ng/mL (ref 30–89)

## 2014-04-08 LAB — FERRITIN: FERRITIN: 51 ng/mL (ref 10–291)

## 2014-04-08 LAB — FOLATE: Folate: 19 ng/mL

## 2014-04-08 LAB — TSH: TSH: 1.96 u[IU]/mL (ref 0.350–4.500)

## 2014-04-14 LAB — BASIC METABOLIC PANEL
BUN: 10 mg/dL (ref 6–23)
CALCIUM: 9.4 mg/dL (ref 8.4–10.5)
CO2: 28 mEq/L (ref 19–32)
CREATININE: 0.78 mg/dL (ref 0.50–1.10)
Chloride: 101 mEq/L (ref 96–112)
GLUCOSE: 87 mg/dL (ref 70–99)
Potassium: 4.5 mEq/L (ref 3.5–5.3)
Sodium: 138 mEq/L (ref 135–145)

## 2014-04-14 NOTE — Progress Notes (Signed)
Quick Note:  All labs are normal. ______ 

## 2014-04-30 ENCOUNTER — Telehealth: Payer: Self-pay | Admitting: Oncology

## 2014-04-30 NOTE — Telephone Encounter (Signed)
Called Anna Ramsey about the refill request for compazine from [facility].  Briteny said she still has occasional nausea in the mornings and would like a refill of the compazine.  Advised her that Dr. Sondra Come is on vacation until Monday and she said she is OK having it refilled on Monday.

## 2014-05-01 ENCOUNTER — Telehealth: Payer: Self-pay | Admitting: Oncology

## 2014-05-01 NOTE — Telephone Encounter (Signed)
Called in refill per Dr. Isidore Moos to [facility] for prochlorperazine (COMPAZINE) 10 MG tablet 05/01/2014 Sig - Route: Take 10 mg by mouth every 6 (six) hours as needed for nausea or vomiting. Disp. 30 tablets, 1 refill.  Called Hila to let her know the refill had been called in.  She verbalized agreement.

## 2014-05-11 ENCOUNTER — Emergency Department
Admission: EM | Admit: 2014-05-11 | Discharge: 2014-05-11 | Disposition: A | Discharge status: Home / Self Care | Payer: PRIVATE HEALTH INSURANCE | Source: Home / Self Care

## 2014-05-11 ENCOUNTER — Encounter: Payer: Self-pay | Admitting: Emergency Medicine

## 2014-05-11 DIAGNOSIS — R300 Dysuria: Secondary | ICD-10-CM | POA: Diagnosis not present

## 2014-05-11 LAB — POCT URINALYSIS DIP (MANUAL ENTRY)
Bilirubin, UA: NEGATIVE
Blood, UA: NEGATIVE
Glucose, UA: NEGATIVE
Ketones, POC UA: NEGATIVE
LEUKOCYTES UA: NEGATIVE
NITRITE UA: NEGATIVE
PH UA: 6 (ref 5–8)
Protein Ur, POC: NEGATIVE
Spec Grav, UA: 1.01 (ref 1.005–1.03)
UROBILINOGEN UA: 0.2 (ref 0–1)

## 2014-05-11 MED ORDER — NITROFURANTOIN MONOHYD MACRO 100 MG PO CAPS: 100.0000 mg | ORAL_CAPSULE | Freq: Two times a day (BID) | ORAL | Status: DC

## 2014-05-11 NOTE — Discharge Instructions (Signed)
Increase fluid intake.  May take AZO for one to two days if desired. If symptoms become significantly worse during the night or over the weekend, proceed to the local emergency room.    Dysuria Dysuria is the medical term for pain with urination. There are many causes for dysuria, but urinary tract infection is the most common. If a urinalysis was performed it can show that there is a urinary tract infection. A urine culture confirms that you or your child is sick. You will need to follow up with a healthcare provider because:  If a urine culture was done you will need to know the culture results and treatment recommendations.  If the urine culture was positive, you or your child will need to be put on antibiotics or know if the antibiotics prescribed are the right antibiotics for your urinary tract infection.  If the urine culture is negative (no urinary tract infection), then other causes may need to be explored or antibiotics need to be stopped. Today laboratory work may have been done and there does not seem to be an infection. If cultures were done they will take at least 24 to 48 hours to be completed. Today x-rays may have been taken and they read as normal. No cause can be found for the problems. The x-rays may be re-read by a radiologist and you will be contacted if additional findings are made. You or your child may have been put on medications to help with this problem until you can see your primary caregiver. If the problems get better, see your primary caregiver if the problems return. If you were given antibiotics (medications which kill germs), take all of the mediations as directed for the full course of treatment.  If laboratory work was done, you need to find the results. Leave a telephone number where you can be reached. If this is not possible, make sure you find out how you are to get test results. HOME CARE INSTRUCTIONS   Drink lots of fluids. For adults, drink eight, 8 ounce  glasses of clear juice or water a day. For children, replace fluids as suggested by your caregiver.  Empty the bladder often. Avoid holding urine for long periods of time.  After a bowel movement, women should cleanse front to back, using each tissue only once.  Empty your bladder before and after sexual intercourse.  Take all the medicine given to you until it is gone. You may feel better in a few days, but TAKE ALL MEDICINE.  Avoid caffeine, tea, alcohol and carbonated beverages, because they tend to irritate the bladder.  In men, alcohol may irritate the prostate.  Only take over-the-counter or prescription medicines for pain, discomfort, or fever as directed by your caregiver.  If your caregiver has given you a follow-up appointment, it is very important to keep that appointment. Not keeping the appointment could result in a chronic or permanent injury, pain, and disability. If there is any problem keeping the appointment, you must call back to this facility for assistance. SEEK IMMEDIATE MEDICAL CARE IF:   Back pain develops.  A fever develops.  There is nausea (feeling sick to your stomach) or vomiting (throwing up).  Problems are no better with medications or are getting worse. MAKE SURE YOU:   Understand these instructions.  Will watch your condition.  Will get help right away if you are not doing well or get worse. Document Released: 06/09/2004 Document Revised: 12/04/2011 Document Reviewed: 04/16/2008 [employer] Patient Information 2015  [employer]. This information is not intended to replace advice given to you by your health care provider. Make sure you discuss any questions you have with your health care provider.

## 2014-05-11 NOTE — ED Provider Notes (Signed)
CSN: [id number]     Arrival date & time 05/11/14  1655 History   None    Chief Complaint  Patient presents with  . Urinary Frequency     HPI Comments: Patient complains of one week history of urinary frequency, hesitancy, and nocturia.  No abdominal or pelvic pain.  No fevers, chills, and sweats.  Patient's last menstrual period was 08/19/2013 (history of radical hysterectomy)  Patient is a 48 y.o. female presenting with frequency. The history is provided by the patient.  Urinary Frequency This is a new problem. Episode onset: 1 week ago. The problem occurs constantly. The problem has not changed since onset.Associated symptoms comments: No abdominal pain . Nothing aggravates the symptoms. Nothing relieves the symptoms. She has tried nothing for the symptoms.    Past Medical History  Diagnosis Date  . Asthma     hx in childhood  . Hypertension   . Seasonal allergies   . Cervical cancer   . History of radiation therapy 10/20/13-11/28/13    pelvis 50.4 gray   Past Surgical History  Procedure Laterality Date  . Foot surgery    . Radical abdominal hysterectomy  12/13/114  . Bilateral salpingoophorectomy  09/06/13   Family History  Problem Relation Age of Onset  . Heart attack Father 75  . Diabetes Father   . Hypertension Maternal Grandmother   . Stroke Maternal Grandmother   . Cancer Mother     breast/thyroid   History  Substance Use Topics  . Smoking status: Former Smoker    Quit date: 09/25/2004  . Smokeless tobacco: Not on file  . Alcohol Use: 0.5 oz/week    1 drink(s) per week   OB History   Grav Para Term Preterm Abortions TAB SAB Ect Mult Living                 Review of Systems  Genitourinary: Positive for frequency.  All other systems reviewed and are negative.   Allergies  Penicillins; Sulfa antibiotics; and Sulfonamide derivatives  Home Medications   Prior to Admission medications   Medication Sig Start Date End Date Taking? Authorizing Provider   albuterol (VENTOLIN HFA) 108 (90 BASE) MCG/ACT inhaler INHALE TWO TO FOUR PUFFS BY MOUTH EVERY 6 HOURS AS NEEDED 07/10/13  Yes Hali Marry, MD  cetirizine (ZYRTEC) 10 MG tablet Take 10 mg by mouth at bedtime.     Yes Historical Provider, MD  metoprolol (LOPRESSOR) 100 MG tablet TAKE ONE TABLET BY MOUTH TWICE DAILY  03/19/14  Yes Hali Marry, MD  PARoxetine (PAXIL) 20 MG tablet TAKE ONE TABLET BY MOUTH ONCE DAILY IN THE MORNING 04/07/14  Yes Hali Marry, MD  traZODone (DESYREL) 50 MG tablet Take 1 tablet (50 mg total) by mouth at bedtime. 04/07/14  Yes Hali Marry, MD  docusate sodium (STOOL SOFTENER) 100 MG capsule Take 100 mg by mouth. 09/05/13   Historical Provider, MD  fluticasone (VERAMYST) 27.5 MCG/SPRAY nasal spray Place 2 sprays into the nose daily. 04/07/14   Hali Marry, MD  nitrofurantoin, macrocrystal-monohydrate, (MACROBID) 100 MG capsule Take 1 capsule (100 mg total) by mouth 2 (two) times daily. Take with food. 05/11/14   Kandra Nicolas, MD  SYMBICORT 160-4.5 MCG/ACT inhaler INHALE TWO PUFFS INTO LUNGS TWICE DAILY    Hali Marry, MD   BP 155/89  Pulse 74  Temp(Src) 98 F (36.7 C) (Oral)  Resp 16  Ht 5\' 1"  (1.549 m)  Wt 135 lb (61.236 kg)  BMI 25.52 kg/m2  SpO2 99%  LMP 08/19/2013 Physical Exam Nursing notes and Vital Signs reviewed. Appearance:  Patient appears healthy, stated age, and in no acute distress Eyes:  Pupils are equal, round, and reactive to light and accomodation.  Extraocular movement is intact.  Conjunctivae are not inflamed   Pharynx:  Normal; moist mucous membranes  Neck:  Supple.  No adenopathy Lungs:  Clear to auscultation.  Breath sounds are equal.  Heart:  Regular rate and rhythm without murmurs, rubs, or gallops.  Abdomen:  Mild suprapubic tenderness without masses or hepatosplenomegaly.  Bowel sounds are present.  No CVA or flank tenderness.  Extremities:  No edema.  No calf tenderness Skin:  No  rash present.    ED Course  Procedures  None  Labs Review Labs Reviewed  URINE CULTURE  POCT URINALYSIS DIP (MANUAL ENTRY):  negative      MDM   1. Dysuria    Urine culture pending.  Begin Macrobid. Increase fluid intake.  May take AZO for one to two days if desired. If symptoms become significantly worse during the night or over the weekend, proceed to the local emergency room. Followup with Family Doctor if not improved in one week.     Kandra Nicolas, MD 05/11/14 [phone]

## 2014-05-11 NOTE — ED Notes (Signed)
Pt c/o frequent urination x 1wk. Denies dysuria or fever.

## 2014-05-12 ENCOUNTER — Other Ambulatory Visit: Payer: Self-pay | Admitting: Family Medicine

## 2014-05-13 LAB — URINE CULTURE
Colony Count: NO GROWTH
Organism ID, Bacteria: NO GROWTH

## 2014-05-15 ENCOUNTER — Telehealth: Payer: Self-pay | Admitting: Emergency Medicine

## 2014-05-15 ENCOUNTER — Telehealth: Payer: Self-pay | Admitting: *Deleted

## 2014-05-15 NOTE — Telephone Encounter (Signed)
Pt called states " in the last 2-3 weeks I've been having some lower back pain left side, pressure to abdomen like menstral cramps or a having period." Pt given appt with Dr. Denman George on 8/28 for further evaluation. Pt confirmed date/time

## 2014-05-19 ENCOUNTER — Other Ambulatory Visit: Payer: Self-pay | Admitting: Family Medicine

## 2014-05-21 ENCOUNTER — Other Ambulatory Visit: Payer: Self-pay | Admitting: Gynecologic Oncology

## 2014-05-21 DIAGNOSIS — C539 Malignant neoplasm of cervix uteri, unspecified: Secondary | ICD-10-CM

## 2014-05-21 NOTE — Progress Notes (Unsigned)
Patient called requesting scan

## 2014-05-22 ENCOUNTER — Encounter: Payer: Self-pay | Admitting: Gynecologic Oncology

## 2014-05-22 ENCOUNTER — Ambulatory Visit (HOSPITAL_COMMUNITY)
Admission: RE | Admit: 2014-05-22 | Discharge: 2014-05-22 | Disposition: A | Discharge status: Home / Self Care | Payer: PRIVATE HEALTH INSURANCE | Source: Ambulatory Visit | Attending: Gynecologic Oncology | Admitting: Gynecologic Oncology

## 2014-05-22 ENCOUNTER — Ambulatory Visit: Payer: PRIVATE HEALTH INSURANCE | Attending: Gynecologic Oncology | Admitting: Gynecologic Oncology

## 2014-05-22 ENCOUNTER — Encounter (HOSPITAL_COMMUNITY): Payer: Self-pay

## 2014-05-22 VITALS — BP 157/98 | HR 67 | Temp 97.7°F | Resp 20 | Ht 61.0 in | Wt 135.5 lb

## 2014-05-22 DIAGNOSIS — C539 Malignant neoplasm of cervix uteri, unspecified: Secondary | ICD-10-CM | POA: Diagnosis present

## 2014-05-22 DIAGNOSIS — Z8541 Personal history of malignant neoplasm of cervix uteri: Secondary | ICD-10-CM | POA: Diagnosis not present

## 2014-05-22 DIAGNOSIS — I10 Essential (primary) hypertension: Secondary | ICD-10-CM | POA: Diagnosis not present

## 2014-05-22 DIAGNOSIS — N3090 Cystitis, unspecified without hematuria: Secondary | ICD-10-CM | POA: Diagnosis not present

## 2014-05-22 DIAGNOSIS — N3040 Irradiation cystitis without hematuria: Secondary | ICD-10-CM

## 2014-05-22 DIAGNOSIS — Z90710 Acquired absence of both cervix and uterus: Secondary | ICD-10-CM | POA: Insufficient documentation

## 2014-05-22 DIAGNOSIS — R350 Frequency of micturition: Secondary | ICD-10-CM | POA: Diagnosis present

## 2014-05-22 DIAGNOSIS — Z880 Allergy status to penicillin: Secondary | ICD-10-CM | POA: Diagnosis not present

## 2014-05-22 DIAGNOSIS — J45909 Unspecified asthma, uncomplicated: Secondary | ICD-10-CM | POA: Diagnosis not present

## 2014-05-22 DIAGNOSIS — Z923 Personal history of irradiation: Secondary | ICD-10-CM | POA: Insufficient documentation

## 2014-05-22 DIAGNOSIS — Z79899 Other long term (current) drug therapy: Secondary | ICD-10-CM | POA: Diagnosis not present

## 2014-05-22 DIAGNOSIS — Z87891 Personal history of nicotine dependence: Secondary | ICD-10-CM | POA: Insufficient documentation

## 2014-05-22 DIAGNOSIS — Z882 Allergy status to sulfonamides status: Secondary | ICD-10-CM | POA: Insufficient documentation

## 2014-05-22 MED ORDER — IOHEXOL 300 MG/ML  SOLN
100.0000 mL | Freq: Once | INTRAMUSCULAR | Status: AC | PRN
  Administered 2014-05-22: 100 mL via INTRAVENOUS

## 2014-05-22 MED ORDER — PHENAZOPYRIDINE HCL 100 MG PO TABS: 100.0000 mg | ORAL_TABLET | Freq: Three times a day (TID) | ORAL | Status: DC | PRN

## 2014-05-22 NOTE — Progress Notes (Signed)
Followup Note: Anna Ramsey   [name] 48 y.o. female  Chief Complaint  Patient presents with  . Follow-up  . 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 Clinically free of disease. 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 Will trial a course of pyridium and consider ditropan if this does not improve symptoms.  Plan:   She returned to see Dr Fermin Schwab in 3 months (October 2015). She's encouraged to use ibuprofen her lower nominal discomfort.  Interval history:  The patient returns today with new symptoms (2 weeks) of intermittent "menstrual like cramps" and urinary frequency and dysuria. UA and culture was negative for infection. She has intermittent diarrhea and constipation. She has intermittent mild left thigh pain and LLE edema (mild). She does not require meds for the pain.   She underwent CT of the abdo/pelvis this morning (05/22/14) which demonstrated: changes in the pelvis consistent with treatment effect (mild stranding), no masses or adenopathy or signs suggestive of recurrent cancer.    HPI:  48yo WMF seen in consultation at the request of Dr. [name] regarding management of a newly diagnosed adenocarcinoma of the cervix.   The patient has a remote history of CIN treated with cryotherapy as well as a LEEP procedure.  .  Since March 2014, she has had a clear vaginal discharge that has become more profuse.    In June she had an AGUS pap smear.  Colposcopy in July was negative as was an ECC (path reviewed) An ultrasound in November 2014 shows a thickening.  A repeat ECC produced abundant tissue which was an adenocarcinoma.  A CT scan shows no adenopathy but an enlarged cervix mass measuring 5.1x4.4x5.2 cm.  There was also a simple 5 cm ovarian cyst. She underwent a radical hysterectomy, bilateral salpingo-oophorectomy,  and pelvic lymphadenectomy on 09/05/2013. Final pathology showed a endocervical adenocarcinoma measuring 5 cm in diameter invading 1.85 cm of a total wall thickness of 2.0 cm. Lymph vascular space invasion was also noted. Surgical margins were clear and 29 lymph nodes were negative. The patient had an uncomplicated postoperative course.  Given the intermediate risk factors postoperative pelvic radiation was recommended. Whole pelvis radiation therapy was completed on 11/28/2013.  She was last seen by Dr Fermin Schwab on 03/06/14 and was NED at that time, her pap smear from that visit was normal.  Review of Systems:10 point review of systems is negative except as noted in interval history.   Vitals: Blood pressure 157/98, pulse 67, temperature 97.7 F (36.5 C), temperature source Oral, resp. rate 20, height 5\' 1"  (1.549 m), weight 135 lb 8 oz (61.462 kg), last menstrual period 08/19/2013.  Physical Exam: General : The patient is a healthy woman in no acute distress.  HEENT: normocephalic, extraoccular movements normal; neck is supple without thyromegally  Lynphnodes: Supraclavicular and inguinal nodes not enlarged  Abdomen: Soft, non-tender, no ascites, no organomegally, no masses, no hernias Pfannenstiel incision and suprapubic incisions are healed Pelvic:  EGBUS: Normal female  Vagina: Normal, no lesions  Urethra and Bladder: Normal, non-tender  Cervix: Surgically absent Uterus:  Surgically absent Bi-manual examination: Non-tender; no adenxal masses or nodularity  Rectal: normal sphincter tone, no masses, no blood  Lower extremities: Normal without any varicosities or edema     Allergies  Allergen Reactions  . Penicillins Other (See Comments)    unknown  . Sulfa Antibiotics Other (See Comments)  Numbness in lower extremeties  . Sulfonamide Derivatives     Past Medical History  Diagnosis Date  . Asthma     hx in childhood  . Hypertension   . Seasonal allergies   .  History of radiation therapy 10/20/13-11/28/13    pelvis 50.4 gray  . Cervical cancer     Past Surgical History  Procedure Laterality Date  . Foot surgery    . Radical abdominal hysterectomy  12/13/114  . Bilateral salpingoophorectomy  09/06/13    Current Outpatient Prescriptions  Medication Sig Dispense Refill  . albuterol (VENTOLIN HFA) 108 (90 BASE) MCG/ACT inhaler INHALE TWO TO FOUR PUFFS BY MOUTH EVERY 6 HOURS AS NEEDED  18 g  1  . cetirizine (ZYRTEC) 10 MG tablet Take 10 mg by mouth at bedtime.        . docusate sodium (STOOL SOFTENER) 100 MG capsule Take 100 mg by mouth.      . fluticasone (VERAMYST) 27.5 MCG/SPRAY nasal spray Place 2 sprays into the nose daily.  10 g  12  . metoprolol (LOPRESSOR) 100 MG tablet TAKE ONE TABLET BY MOUTH TWICE DAILY   60 tablet  0  . PARoxetine (PAXIL) 20 MG tablet TAKE ONE TABLET BY MOUTH ONCE DAILY IN THE MORNING  90 tablet  0  . SYMBICORT 160-4.5 MCG/ACT inhaler INHALE 2 PUFFS INTO THE LUNGS TWICE DAILY. *NEED APPOINTMENT FOR FURTHER REFILLS*  6 Inhaler  0  . traZODone (DESYREL) 50 MG tablet Take 1 tablet (50 mg total) by mouth at bedtime.  90 tablet  1  . phenazopyridine (PYRIDIUM) 100 MG tablet Take 1 tablet (100 mg total) by mouth 3 (three) times daily as needed for pain.  10 tablet  1  . [DISCONTINUED] prochlorperazine (COMPAZINE) 10 MG tablet Take 10 mg by mouth every 6 (six) hours as needed for nausea or vomiting. Called in to [facility] per Dr. Isidore Moos.  Disp. 30 tablets. 1 refill.       No current facility-administered medications for this visit.    History   Social History  . Marital Status: Married    Spouse Name: N/A    Number of Children: 1  . Years of Education: N/A   Occupational History  .     Social History Main Topics  . Smoking status: Former Smoker    Quit date: 09/25/2004  . Smokeless tobacco: Not on file  . Alcohol Use: 0.5 oz/week    1 drink(s) per week  . Drug Use: No  . Sexual Activity: Yes    Other Topics Concern  . Not on file   Social History Narrative  . No narrative on file    Family History  Problem Relation Age of Onset  . Heart attack Father 73  . Diabetes Father   . Hypertension Maternal Grandmother   . Stroke Maternal Grandmother   . Cancer Mother     breast/thyroid      Donaciano Eva, MD 05/22/2014, 9:59 AM

## 2014-05-28 ENCOUNTER — Ambulatory Visit: Payer: PRIVATE HEALTH INSURANCE | Admitting: Radiation Oncology

## 2014-06-08 ENCOUNTER — Ambulatory Visit: Payer: Self-pay | Admitting: Radiation Oncology

## 2014-06-09 ENCOUNTER — Ambulatory Visit: Payer: PRIVATE HEALTH INSURANCE | Admitting: Family Medicine

## 2014-06-16 ENCOUNTER — Encounter: Payer: Self-pay | Admitting: Family Medicine

## 2014-06-16 ENCOUNTER — Ambulatory Visit (INDEPENDENT_AMBULATORY_CARE_PROVIDER_SITE_OTHER): Payer: PRIVATE HEALTH INSURANCE | Admitting: Family Medicine

## 2014-06-16 VITALS — BP 116/79 | HR 55 | Temp 97.9°F | Ht 61.0 in | Wt 132.0 lb

## 2014-06-16 DIAGNOSIS — Z23 Encounter for immunization: Secondary | ICD-10-CM | POA: Diagnosis not present

## 2014-06-16 DIAGNOSIS — F411 Generalized anxiety disorder: Secondary | ICD-10-CM | POA: Diagnosis not present

## 2014-06-16 DIAGNOSIS — C539 Malignant neoplasm of cervix uteri, unspecified: Secondary | ICD-10-CM | POA: Diagnosis not present

## 2014-06-16 DIAGNOSIS — J45909 Unspecified asthma, uncomplicated: Secondary | ICD-10-CM | POA: Diagnosis not present

## 2014-06-16 NOTE — Progress Notes (Signed)
   Subjective:    Patient ID: Anna Ramsey, female    DOB: 12-25-1965, 48 y.o.   MRN: [id number]  HPI Two-month followup for anxiety. We increased her Paxil last time she was here to 20 mg. Started exercising about 2 months ago.  She has felt better as far as energy levels are concerned. Happy with increasing her mediction. Denies any S.E.  Adenocarcinoma of cervix.  Just had repeat CT so not sure if will need further treatments.    Asthma - doing well on symbicort.  Rarely uses her albuterol.  Has had some itchey eyes.    Review of Systems     Objective:   Physical Exam  Constitutional: She is oriented to person, place, and time. She appears well-developed and well-nourished.  HENT:  Head: Normocephalic and atraumatic.  Cardiovascular: Normal rate, regular rhythm and normal heart sounds.   Pulmonary/Chest: Effort normal and breath sounds normal.  Neurological: She is alert and oriented to person, place, and time.  Skin: Skin is warm and dry.  Psychiatric: She has a normal mood and affect. Her behavior is normal.          Assessment & Plan:  Generalized anxiety disorder-gad 7 score of  2, down from previous score of 5 today. Well controlled.  F/u in 6 months.   Adenocarcinoma of cervix - in remission.    Asthma - well controlled.  Discussed need to step down therapy to just an inhaled corticosteroid.  She feels like her allergies have flared some the last week or two so wants to hold off on stepping down her her asthma theapy.  [name]

## 2014-07-02 ENCOUNTER — Other Ambulatory Visit: Payer: Self-pay | Admitting: Family Medicine

## 2014-07-02 ENCOUNTER — Other Ambulatory Visit: Payer: Self-pay | Admitting: Physician Assistant

## 2014-07-03 ENCOUNTER — Other Ambulatory Visit (HOSPITAL_COMMUNITY)
Admission: RE | Admit: 2014-07-03 | Discharge: 2014-07-03 | Disposition: A | Discharge status: Home / Self Care | Payer: PRIVATE HEALTH INSURANCE | Source: Ambulatory Visit | Attending: Gynecology | Admitting: Gynecology

## 2014-07-03 ENCOUNTER — Encounter: Payer: Self-pay | Admitting: Gynecology

## 2014-07-03 ENCOUNTER — Ambulatory Visit: Payer: PRIVATE HEALTH INSURANCE | Attending: Gynecology | Admitting: Gynecology

## 2014-07-03 DIAGNOSIS — Z01411 Encounter for gynecological examination (general) (routine) with abnormal findings: Secondary | ICD-10-CM | POA: Diagnosis present

## 2014-07-03 DIAGNOSIS — Z08 Encounter for follow-up examination after completed treatment for malignant neoplasm: Secondary | ICD-10-CM | POA: Insufficient documentation

## 2014-07-03 DIAGNOSIS — J45909 Unspecified asthma, uncomplicated: Secondary | ICD-10-CM | POA: Insufficient documentation

## 2014-07-03 DIAGNOSIS — I10 Essential (primary) hypertension: Secondary | ICD-10-CM | POA: Diagnosis not present

## 2014-07-03 DIAGNOSIS — Z8541 Personal history of malignant neoplasm of cervix uteri: Secondary | ICD-10-CM | POA: Insufficient documentation

## 2014-07-03 DIAGNOSIS — Z90722 Acquired absence of ovaries, bilateral: Secondary | ICD-10-CM | POA: Diagnosis not present

## 2014-07-03 DIAGNOSIS — Z923 Personal history of irradiation: Secondary | ICD-10-CM | POA: Diagnosis not present

## 2014-07-03 DIAGNOSIS — R87810 Cervical high risk human papillomavirus (HPV) DNA test positive: Secondary | ICD-10-CM | POA: Diagnosis present

## 2014-07-03 DIAGNOSIS — Z90710 Acquired absence of both cervix and uterus: Secondary | ICD-10-CM | POA: Diagnosis not present

## 2014-07-03 DIAGNOSIS — C539 Malignant neoplasm of cervix uteri, unspecified: Secondary | ICD-10-CM

## 2014-07-03 DIAGNOSIS — Z1151 Encounter for screening for human papillomavirus (HPV): Secondary | ICD-10-CM | POA: Insufficient documentation

## 2014-07-03 NOTE — Addendum Note (Signed)
Addended by: Joylene John D on: 07/03/2014 10:37 AM   Modules accepted: Orders

## 2014-07-03 NOTE — Patient Instructions (Signed)
Return to see me in 3 months. We will contact you if you're Pap smear report.

## 2014-07-03 NOTE — Progress Notes (Signed)
Consult Note: Gyn-Onc   Anna Ramsey 48 y.o. female  Chief Complaint  Patient presents with  . Cervical Cancer    Assessment :Stage IB2 adenocarcinoma of the cervix status post radical abdominal hysterectomy, BSO, pelvic lymphadenectomy. Clinically free of disease.  Urinary urgency was likely secondary to pelvic radiation therapy.  Plan:    Pap smear is obtained She returned to see me in 3 months. We discussed the possibility of experimenting with different her pain and to see if it would help with her bladder symptoms. However at this time the patient wishes to defer any further treatment but we'll contact me if she has worsening bladder symptoms.  Interval history:  The patient returns today as previously scheduled . Since her last visit she's done well. She saw Dr. Denman George in September because of urinary symptoms. She was placed on Pyridium and had some improvement of those symptoms for the 5 days she is on Pyridium. The present time she sees be tolerating the symptoms of urgency okay and does not wish to have any further evaluation or intervention.[name] Appetite is good she has no other GI or GU symptoms. . She denies any pelvic pain pressure vaginal bleeding or discharge.   HPI:  Anna Ramsey seen in consultation at the request of Dr. [name] regarding management of a newly diagnosed adenocarcinoma of the cervix.   The patient has a remote history of CIN treated with cryotherapy as well as a LEEP procedure.  .  Since March 2014, she has had a clear vaginal discharge that has become more profuse.    In June she had an AGUS pap smear.  Colposcopy in July was negative as was an ECC (path reviewed) An ultrasound in November 2014 shows a thickening.  A repeat ECC produced abundant tissue which was an adenocarcinoma.  A CT scan shows no adenopathy but an enlarged cervix mass measuring 5.1x4.4x5.2 cm.  There was also a simple 5 cm ovarian cyst. She underwent a radical hysterectomy,  bilateral salpingo-oophorectomy, and pelvic lymphadenectomy on 09/05/2013. Final pathology showed a endocervical adenocarcinoma measuring 5 cm in diameter invading 1.85 cm of a total wall thickness of 2.0 cm. Lymph vascular space invasion was also noted. Surgical margins were clear and 29 lymph nodes were negative. The patient had an uncomplicated postoperative course.  Given the intermediate risk factors postoperative pelvic radiation was recommended. Whole pelvis radiation therapy was completed on 11/28/2013.  Review of Systems:10 point review of systems is negative except as noted in interval history.   Vitals: Last menstrual period 08/19/2013.  Physical Exam: General : The patient is a healthy woman in no acute distress.  HEENT: normocephalic, extraoccular movements normal; neck is supple without thyromegally  Lynphnodes: Supraclavicular and inguinal nodes not enlarged  Abdomen: Soft, non-tender, no ascites, no organomegally, no masses, no hernias Pfannenstiel incision and suprapubic incisions are healed Pelvic:  EGBUS: Normal female  Vagina: Normal, no lesions  Urethra and Bladder: Normal, non-tender  Cervix: Surgically absent Uterus:  Surgically absent Bi-manual examination: Non-tender; no adenxal masses or nodularity  Rectal: normal sphincter tone, no masses, no blood  Lower extremities: Normal without any varicosities or edema     Allergies  Allergen Reactions  . Penicillins Other (See Comments)    unknown  . Sulfa Antibiotics Other (See Comments)    Numbness in lower extremeties  . Sulfonamide Derivatives     Past Medical History  Diagnosis Date  . Asthma     hx in childhood  .  Hypertension   . Seasonal allergies   . History of radiation therapy 10/20/13-11/28/13    pelvis 50.4 gray  . Cervical cancer     Past Surgical History  Procedure Laterality Date  . Foot surgery    . Radical abdominal hysterectomy  12/13/114  . Bilateral salpingoophorectomy  09/06/13     Current Outpatient Prescriptions  Medication Sig Dispense Refill  . albuterol (VENTOLIN HFA) 108 (90 BASE) MCG/ACT inhaler INHALE TWO TO FOUR PUFFS BY MOUTH EVERY 6 HOURS AS NEEDED  18 g  1  . cetirizine (ZYRTEC) 10 MG tablet Take 10 mg by mouth at bedtime.        . fluticasone (VERAMYST) 27.5 MCG/SPRAY nasal spray Place 2 sprays into the nose daily.  10 g  12  . metoprolol (LOPRESSOR) 100 MG tablet TAKE ONE TABLET BY MOUTH TWICE DAILY  60 tablet  0  . PARoxetine (PAXIL) 20 MG tablet TAKE ONE TABLET BY MOUTH ONCE DAILY IN THE MORNING  90 tablet  0  . SYMBICORT 160-4.5 MCG/ACT inhaler INHALE TWO PUFFS INTO THE LUNGS TWICE DAILY. *NEED APPOINTMENT FOR FURTHER REFILLS*  10.2 g  0  . traZODone (DESYREL) 50 MG tablet Take 1 tablet (50 mg total) by mouth at bedtime.  90 tablet  1  . [DISCONTINUED] prochlorperazine (COMPAZINE) 10 MG tablet Take 10 mg by mouth every 6 (six) hours as needed for nausea or vomiting. Called in to [facility] per Dr. Isidore Moos.  Disp. 30 tablets. 1 refill.       No current facility-administered medications for this visit.    History   Social History  . Marital Status: Married    Spouse Name: N/A    Number of Children: 1  . Years of Education: N/A   Occupational History  .     Social History Main Topics  . Smoking status: Former Smoker    Quit date: 09/25/2004  . Smokeless tobacco: Not on file  . Alcohol Use: 0.5 oz/week    1 drink(s) per week  . Drug Use: No  . Sexual Activity: Yes   Other Topics Concern  . Not on file   Social History Narrative  . No narrative on file    Family History  Problem Relation Age of Onset  . Heart attack Father 35  . Diabetes Father   . Hypertension Maternal Grandmother   . Stroke Maternal Grandmother   . Cancer Mother     breast/thyroid      Alvino Chapel, MD 07/03/2014, 9:34 AM

## 2014-07-07 LAB — CYTOLOGY - PAP

## 2014-07-31 ENCOUNTER — Telehealth: Payer: Self-pay | Admitting: Gynecologic Oncology

## 2014-07-31 NOTE — Telephone Encounter (Signed)
Message left about pap smear results with Dr. Mora Bellman recommendations for repeat pap at next visit.

## 2014-08-05 ENCOUNTER — Other Ambulatory Visit: Payer: Self-pay | Admitting: Family Medicine

## 2014-08-19 ENCOUNTER — Other Ambulatory Visit: Payer: Self-pay | Admitting: Physician Assistant

## 2014-09-07 ENCOUNTER — Telehealth: Payer: Self-pay | Admitting: Nurse Practitioner

## 2014-09-07 NOTE — Telephone Encounter (Signed)
Patient called requesting f/u appointment with Dr. Fermin Schwab. Per last MD note 07/03/14, plan for f/u in 3 months. appt made for 10/01/13 at 2:30. Patient verbalizes understanding of appt date/time.

## 2014-09-10 ENCOUNTER — Ambulatory Visit
Admission: RE | Admit: 2014-09-10 | Discharge: 2014-09-10 | Disposition: A | Discharge status: Home / Self Care | Payer: PRIVATE HEALTH INSURANCE | Source: Ambulatory Visit | Attending: Radiation Oncology | Admitting: Radiation Oncology

## 2014-09-10 ENCOUNTER — Encounter: Payer: Self-pay | Admitting: Radiation Oncology

## 2014-09-10 VITALS — BP 107/71 | HR 58 | Temp 97.6°F | Resp 16 | Ht 61.5 in | Wt 130.1 lb

## 2014-09-10 DIAGNOSIS — C539 Malignant neoplasm of cervix uteri, unspecified: Secondary | ICD-10-CM

## 2014-09-10 NOTE — Progress Notes (Signed)
Radiation Oncology         [phone] ________________________________  Name: Anna Ramsey MRN: [id number]  Date: 09/10/2014  DOB: 01-09-1966  Follow-Up Visit Note  CC: METHENEY,CATHERINE, MD  Marti Sleigh *    ICD-9-CM ICD-10-CM   1. Adenocarcinoma of cervix, stage 1 180.9 C53.9     Diagnosis:   Stage 1B-2 Adenocarcinoma of the Cervix  Interval Since Last Radiation:  9  months, she completed postoperative pelvic radiation therapy to a dose of 50.4 gray  Narrative:  The patient returns today for routine follow-up. She Is doing well at this time. She has had resolution of her coccygeal pain. She denies any further bladder symptoms. Patient occasionally have diarrhea associated with spicy foods nothing on regular basis. Her appetite continues to be suboptimal. She denies any pelvic pain or abdominal pain. She continues to use her vaginal dilator and does not notice any bleeding.             Patient's Pap smear in October showed atypical squamous cells of undetermined significance.   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  The patient will have followup of this issue in January with gynecologic oncology.                 ALLERGIES:  is allergic to penicillins; sulfa antibiotics; and sulfonamide derivatives.  Meds: Current Outpatient Prescriptions  Medication Sig Dispense Refill  . albuterol (VENTOLIN HFA) 108 (90 BASE) MCG/ACT inhaler INHALE TWO TO FOUR PUFFS BY MOUTH EVERY 6 HOURS AS NEEDED 18 g 1  . budesonide-formoterol (SYMBICORT) 160-4.5 MCG/ACT inhaler INHALE TWO PUFFS INTO LUNGS TWICE DAILY 10.2 g 11  . cetirizine (ZYRTEC) 10 MG tablet Take 10 mg by mouth at bedtime.      . fluticasone (VERAMYST) 27.5 MCG/SPRAY nasal spray Place 2 sprays into the nose daily. 10 g 12  . metoprolol (LOPRESSOR) 100 MG tablet TAKE ONE TABLET BY MOUTH TWICE DAILY 60 tablet 0  . PARoxetine (PAXIL) 20 MG tablet TAKE ONE TABLET BY MOUTH ONCE DAILY IN THE MORNING  90 tablet 0  . traZODone (DESYREL) 50 MG tablet Take 1 tablet (50 mg total) by mouth at bedtime. 90 tablet 1  . [DISCONTINUED] prochlorperazine (COMPAZINE) 10 MG tablet Take 10 mg by mouth every 6 (six) hours as needed for nausea or vomiting. Called in to [facility] per Dr. Isidore Moos.  Disp. 30 tablets. 1 refill.     No current facility-administered medications for this encounter.    Physical Findings: The patient is in no acute distress. Patient is alert and oriented.  height is 5' 1.5" (1.562 m) and weight is 130 lb 1.6 oz (59.013 kg). Her oral temperature is 97.6 F (36.4 C). Her blood pressure is 107/71 and her pulse is 58. Her respiration is 16. .  The lungs are clear. The heart has a regular rhythm and rate. The abdomen is soft and nontender with normal bowel sounds. No palpable supraclavicular or axillary adenopathy. A pelvic exam as not performed in light of her recent exam by Gyn oncology and upcoming exam in January with anticipated Pap smear.  Lab Findings: Lab Results  Component Value Date   WBC 4.1 04/07/2014   HGB 15.0 04/07/2014   HCT 44.8 04/07/2014   MCV 96.6 04/07/2014   PLT 292 04/07/2014    Radiographic Findings: No results found.  Impression:  The patient is recovering from the effects of radiation. Her previously noticed issues with coccygeal pain and urination have  resolved at this time.  Plan:  Patient will be seen on a prn basis in radiation oncology in light of her close followup in gynecologic oncology.  ____________________________________ Blair Promise, MD

## 2014-09-10 NOTE — Progress Notes (Signed)
Follow up cervicl ca rad txs 10/20/13-11/28/13, no c/o hematuria, no discharge, no dysuria, nausea,vomiting, diarhhea only when eating certain foods, appetite fair, energy level good, last pap 07/03/14, sees Dr. Aldean Ast 10/01/14 8:05 AM

## 2014-10-01 ENCOUNTER — Ambulatory Visit: Payer: PRIVATE HEALTH INSURANCE | Attending: Gynecology | Admitting: Gynecology

## 2014-10-01 ENCOUNTER — Other Ambulatory Visit (HOSPITAL_COMMUNITY)
Admission: RE | Admit: 2014-10-01 | Discharge: 2014-10-01 | Disposition: A | Discharge status: Home / Self Care | Payer: PRIVATE HEALTH INSURANCE | Source: Ambulatory Visit | Attending: Gynecology | Admitting: Gynecology

## 2014-10-01 ENCOUNTER — Encounter: Payer: Self-pay | Admitting: Gynecology

## 2014-10-01 VITALS — BP 126/80 | HR 62 | Temp 97.9°F | Resp 16 | Ht 61.5 in | Wt 130.2 lb

## 2014-10-01 DIAGNOSIS — Z923 Personal history of irradiation: Secondary | ICD-10-CM | POA: Insufficient documentation

## 2014-10-01 DIAGNOSIS — Z90710 Acquired absence of both cervix and uterus: Secondary | ICD-10-CM | POA: Diagnosis not present

## 2014-10-01 DIAGNOSIS — I10 Essential (primary) hypertension: Secondary | ICD-10-CM | POA: Insufficient documentation

## 2014-10-01 DIAGNOSIS — Z87891 Personal history of nicotine dependence: Secondary | ICD-10-CM | POA: Diagnosis not present

## 2014-10-01 DIAGNOSIS — Z90722 Acquired absence of ovaries, bilateral: Secondary | ICD-10-CM | POA: Insufficient documentation

## 2014-10-01 DIAGNOSIS — Z8541 Personal history of malignant neoplasm of cervix uteri: Secondary | ICD-10-CM | POA: Diagnosis not present

## 2014-10-01 DIAGNOSIS — Z7951 Long term (current) use of inhaled steroids: Secondary | ICD-10-CM | POA: Diagnosis not present

## 2014-10-01 DIAGNOSIS — Z08 Encounter for follow-up examination after completed treatment for malignant neoplasm: Secondary | ICD-10-CM | POA: Insufficient documentation

## 2014-10-01 DIAGNOSIS — C539 Malignant neoplasm of cervix uteri, unspecified: Secondary | ICD-10-CM

## 2014-10-01 DIAGNOSIS — Z01411 Encounter for gynecological examination (general) (routine) with abnormal findings: Secondary | ICD-10-CM | POA: Diagnosis not present

## 2014-10-01 DIAGNOSIS — J45909 Unspecified asthma, uncomplicated: Secondary | ICD-10-CM | POA: Diagnosis not present

## 2014-10-01 DIAGNOSIS — R3915 Urgency of urination: Secondary | ICD-10-CM | POA: Insufficient documentation

## 2014-10-01 NOTE — Addendum Note (Signed)
Addended by: Christa See on: 10/01/2014 02:00 PM   Modules accepted: Orders

## 2014-10-01 NOTE — Progress Notes (Signed)
Consult Note: Gyn-Onc   Anna Ramsey 49 y.o. female  Chief Complaint  Patient presents with  . 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 Clinically free of disease.  Urinary urgency much improved.  Plan:    Pap smear is obtained She returned to see me in 6 months.  Signs and symptoms of recurrent disease were discussed.  Interval history:  The patient returns today as previously scheduled . Since her last visit she's done well.  Appetite is good she has no other GI or GU symptoms. . Her urinary tract symptoms at last visit seem to have resolved. She denies any pelvic pain pressure vaginal bleeding or discharge. Functional status is excellent.   HPI:  Anna Ramsey seen in consultation at the request of Dr. [name] regarding management of a newly diagnosed adenocarcinoma of the cervix.   The patient has a remote history of CIN treated with cryotherapy as well as a LEEP procedure.  .  Since March 2014, she has had a clear vaginal discharge that has become more profuse.    In June she had an AGUS pap smear.  Colposcopy in July was negative as was an ECC (path reviewed) An ultrasound in November 2014 shows a thickening.  A repeat ECC produced abundant tissue which was an adenocarcinoma.  A CT scan shows no adenopathy but an enlarged cervix mass measuring 5.1x4.4x5.2 cm.  There was also a simple 5 cm ovarian cyst. She underwent a radical hysterectomy, bilateral salpingo-oophorectomy, and pelvic lymphadenectomy on 09/05/2013. Final pathology showed a endocervical adenocarcinoma measuring 5 cm in diameter invading 1.85 cm of a total wall thickness of 2.0 cm. Lymph vascular space invasion was also noted. Surgical margins were clear and 29 lymph nodes were negative. The patient had an uncomplicated postoperative course.  Given the intermediate risk factors postoperative  pelvic radiation was recommended. Whole pelvis radiation therapy was completed on 11/28/2013.  Review of Systems:10 point review of systems is negative except as noted in interval history.   Vitals: Blood pressure 126/80, pulse 62, temperature 97.9 F (36.6 C), temperature source Oral, resp. rate 16, height 5' 1.5" (1.562 m), weight 130 lb 3.2 oz (59.058 kg), last menstrual period 08/19/2013.  Physical Exam: General : The patient is a healthy woman in no acute distress.  HEENT: normocephalic, extraoccular movements normal; neck is supple without thyromegally  Lynphnodes: Supraclavicular and inguinal nodes not enlarged  Abdomen: Soft, non-tender, no ascites, no organomegally, no masses, no hernias Pfannenstiel incision and suprapubic incisions are healed Pelvic:  EGBUS: Normal female  Vagina: Normal, no lesions  Urethra and Bladder: Normal, non-tender  Cervix: Surgically absent Uterus:  Surgically absent Bi-manual examination: Non-tender; no adenxal masses or nodularity  Rectal: normal sphincter tone, no masses, no blood  Lower extremities: Normal without any varicosities or edema     Allergies  Allergen Reactions  . Penicillins Other (See Comments)    unknown  . Sulfa Antibiotics Other (See Comments)    Numbness in lower extremeties  . Sulfonamide Derivatives     Past Medical History  Diagnosis Date  . Asthma     hx in childhood  . Hypertension   . Seasonal allergies   . History of radiation therapy 10/20/13-11/28/13    pelvis 50.4 gray  . Cervical cancer     Past Surgical History  Procedure Laterality Date  . Foot surgery    . Radical abdominal hysterectomy  12/13/114  . Bilateral salpingoophorectomy  09/06/13    Current Outpatient Prescriptions  Medication Sig Dispense Refill  . albuterol (VENTOLIN HFA) 108 (90 BASE) MCG/ACT inhaler INHALE TWO TO FOUR PUFFS BY MOUTH EVERY 6 HOURS AS NEEDED 18 g 1  . budesonide-formoterol (SYMBICORT) 160-4.5 MCG/ACT inhaler INHALE  TWO PUFFS INTO LUNGS TWICE DAILY 10.2 g 11  . cetirizine (ZYRTEC) 10 MG tablet Take 10 mg by mouth at bedtime.      . fluticasone (VERAMYST) 27.5 MCG/SPRAY nasal spray Place 2 sprays into the nose daily. 10 g 12  . metoprolol (LOPRESSOR) 100 MG tablet TAKE ONE TABLET BY MOUTH TWICE DAILY 60 tablet 0  . PARoxetine (PAXIL) 20 MG tablet TAKE ONE TABLET BY MOUTH ONCE DAILY IN THE MORNING 90 tablet 0  . traZODone (DESYREL) 50 MG tablet Take 1 tablet (50 mg total) by mouth at bedtime. 90 tablet 1  . [DISCONTINUED] prochlorperazine (COMPAZINE) 10 MG tablet Take 10 mg by mouth every 6 (six) hours as needed for nausea or vomiting. Called in to [facility] per Dr. Isidore Moos.  Disp. 30 tablets. 1 refill.     No current facility-administered medications for this visit.    History   Social History  . Marital Status: Married    Spouse Name: N/A    Number of Children: 1  . Years of Education: N/A   Occupational History  .     Social History Main Topics  . Smoking status: Former Smoker    Quit date: 09/25/2004  . Smokeless tobacco: Not on file  . Alcohol Use: 0.5 oz/week    1 drink(s) per week  . Drug Use: No  . Sexual Activity: Yes   Other Topics Concern  . Not on file   Social History Narrative    Family History  Problem Relation Age of Onset  . Heart attack Father 71  . Diabetes Father   . Hypertension Maternal Grandmother   . Stroke Maternal Grandmother   . Cancer Mother     breast/thyroid      Alvino Chapel, MD 10/01/2014, 1:41 PM

## 2014-10-01 NOTE — Patient Instructions (Addendum)
Return to see [place] in 6 months. Please call sooner with any questions or concerns.

## 2014-10-05 LAB — CYTOLOGY - PAP

## 2014-10-06 ENCOUNTER — Telehealth: Payer: Self-pay | Admitting: Gynecologic Oncology

## 2014-10-06 NOTE — Telephone Encounter (Signed)
Message left for patient with pap smear results: negative.  Instructed to call for any questions or concerns.  

## 2014-10-13 ENCOUNTER — Telehealth: Payer: Self-pay | Admitting: *Deleted

## 2014-10-13 ENCOUNTER — Telehealth: Payer: Self-pay

## 2014-10-13 DIAGNOSIS — R143 Flatulence: Secondary | ICD-10-CM

## 2014-10-13 DIAGNOSIS — Z923 Personal history of irradiation: Secondary | ICD-10-CM

## 2014-10-13 NOTE — Telephone Encounter (Signed)
Anna Ramsey called this am to report, that since she completed radiation therapy on March 2015, she is experiencing continuous "smelly, awful" flatus with no control, and no results with Gas-X. She states that this is debilitating and embarrassing at home and at work. She reports that her stools are muddy and watery in appearance no matter what she takes to bulk up her stools. Changing her eating habits does not effect a change, either. Please advise.

## 2014-10-13 NOTE — Telephone Encounter (Signed)
[place] for referral if she would like. I would also recommend stool culture and check for c diff. She could go downstairs to get specimen cups to drop off.

## 2014-10-13 NOTE — Telephone Encounter (Signed)
Ms. Brodbeck called this am to report, that since she completed radiation therapy on March 2015, she is  experiencing continuous "smelly, awful" flatus with no control, and no results with Gas-X. She states that this is debilitating and embarrassing at home and at work.  She reports that her stools are muddy and watery in appearance no matter what she takes to bulk up her stools.  Changing her eating habits does not effect a change, either. Reported above information to Dr. Sondra Come and his Nurse Elmo Putt.  Dr. Sondra Come recommends she call her primary care for an assessment and then request a referral to a Gastroenterologist.  She stated she would call immediately.  Requested she call our practice for an update and she stated agreement.

## 2014-10-14 NOTE — Telephone Encounter (Signed)
Patient advised and agrees. Referral and orders placed.

## 2014-11-04 ENCOUNTER — Other Ambulatory Visit: Payer: Self-pay | Admitting: Family Medicine

## 2014-11-20 ENCOUNTER — Other Ambulatory Visit: Payer: Self-pay | Admitting: Family Medicine

## 2014-11-20 NOTE — Telephone Encounter (Signed)
Due for f/u appt

## 2014-12-15 ENCOUNTER — Ambulatory Visit (INDEPENDENT_AMBULATORY_CARE_PROVIDER_SITE_OTHER): Payer: PRIVATE HEALTH INSURANCE | Admitting: Family Medicine

## 2014-12-15 ENCOUNTER — Encounter: Payer: Self-pay | Admitting: Family Medicine

## 2014-12-15 VITALS — BP 133/86 | HR 65 | Ht 62.0 in | Wt 125.0 lb

## 2014-12-15 DIAGNOSIS — J4530 Mild persistent asthma, uncomplicated: Secondary | ICD-10-CM

## 2014-12-15 DIAGNOSIS — F411 Generalized anxiety disorder: Secondary | ICD-10-CM

## 2014-12-15 DIAGNOSIS — I10 Essential (primary) hypertension: Secondary | ICD-10-CM | POA: Diagnosis not present

## 2014-12-15 NOTE — Progress Notes (Signed)
   Subjective:    Patient ID: Anna Ramsey, female    DOB: 1966-04-17, 49 y.o.   MRN: [id number]  HPI Six-month follow-up for anxiety. She's currently on Paxil 20 mg.  Says her job is really stresfful and would like to be on it.    Hypertension- Pt denies chest pain, SOB, dizziness, or heart palpitations.  Taking meds as directed w/o problems.  Denies medication side effects.  She's currently on metoprolol milligrams twice a day.  Asthma-currently on Symbicort. She hasn't used her albuterol at all in weeks.  No nightimes sxs. She is taking her allergy meds for the spring. She is going to try Flonase instead of veramyst as her deductible for drugs is really high.     Review of Systems     Objective:   Physical Exam  Constitutional: She is oriented to person, place, and time. She appears well-developed and well-nourished.  HENT:  Head: Normocephalic and atraumatic.  Cardiovascular: Normal rate, regular rhythm and normal heart sounds.   Pulmonary/Chest: Effort normal and breath sounds normal.  Neurological: She is alert and oriented to person, place, and time.  Skin: Skin is warm and dry.  Psychiatric: She has a normal mood and affect. Her behavior is normal.        Assessment & Plan:  Anxiety-gad 7 score of 0 today. Previous was 2. She rates her symptoms as not difficult at all. PHQ 9 score of 2 today which she marked just for sleep and fatigue and not for actual depression issues. She rates her symptoms as not difficult at all.  Hypertension-well-controlled. Continue current regimen. Follow-up in 6 months.  Asthma- mild intermittant.  AFter the spring if doing well then will consider stepping down her therapy.

## 2015-01-14 ENCOUNTER — Other Ambulatory Visit: Payer: Self-pay | Admitting: Physician Assistant

## 2015-01-20 ENCOUNTER — Other Ambulatory Visit: Payer: Self-pay | Admitting: Family Medicine

## 2015-02-08 ENCOUNTER — Encounter: Payer: Self-pay | Admitting: Gynecology

## 2015-02-08 ENCOUNTER — Ambulatory Visit: Payer: PRIVATE HEALTH INSURANCE | Attending: Gynecology | Admitting: Gynecology

## 2015-02-08 ENCOUNTER — Other Ambulatory Visit (HOSPITAL_COMMUNITY)
Admission: RE | Admit: 2015-02-08 | Discharge: 2015-02-08 | Disposition: A | Discharge status: Home / Self Care | Payer: PRIVATE HEALTH INSURANCE | Source: Ambulatory Visit | Attending: Gynecology | Admitting: Gynecology

## 2015-02-08 VITALS — BP 144/86 | HR 61 | Temp 97.9°F | Resp 16 | Ht 61.5 in | Wt 136.1 lb

## 2015-02-08 DIAGNOSIS — Z01411 Encounter for gynecological examination (general) (routine) with abnormal findings: Secondary | ICD-10-CM | POA: Diagnosis not present

## 2015-02-08 DIAGNOSIS — C539 Malignant neoplasm of cervix uteri, unspecified: Secondary | ICD-10-CM | POA: Diagnosis not present

## 2015-02-08 DIAGNOSIS — Z8541 Personal history of malignant neoplasm of cervix uteri: Secondary | ICD-10-CM

## 2015-02-08 DIAGNOSIS — K520 Gastroenteritis and colitis due to radiation: Secondary | ICD-10-CM

## 2015-02-08 DIAGNOSIS — Z90710 Acquired absence of both cervix and uterus: Secondary | ICD-10-CM | POA: Diagnosis not present

## 2015-02-08 NOTE — Progress Notes (Signed)
Consult Note: Gyn-Onc   Anna Ramsey 49 y.o. female  Chief Complaint  Patient presents with  . 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 Clinically free of disease. Mild small bowel radiation enteritis.  Plan:    Pap smear is obtained . It is just she avoid milk products, fried foods, and green leafy vesicle see if this helps her GI symptoms.She returned to see me in 6 months.  Signs and symptoms of recurrent disease were discussed.  Interval history:  The patient returns today as previously scheduled . Since her last visit she's done well. She denies any pelvic pain pressure vaginal bleeding or discharge. Functional status is excellent.she does note that she is "gassy". She seen a gastroenterologist who suggested increasing fiber and using probiotics.   HPI:  49yo WMF seen in consultation at the request of Dr. Freda Ramsey regarding management of a newly diagnosed adenocarcinoma of the cervix.   The patient has a remote history of CIN treated with cryotherapy as well as a LEEP procedure.  .  Since March 2014, she has had a clear vaginal discharge that has become more profuse.    In June she had an AGUS pap smear.  Colposcopy in July was negative as was an ECC (path reviewed) An ultrasound in November 2014 shows a thickening.  A repeat ECC produced abundant tissue which was an adenocarcinoma.  A CT scan shows no adenopathy but an enlarged cervix mass measuring 5.1x4.4x5.2 cm.  There was also a simple 5 cm ovarian cyst. She underwent a radical hysterectomy, bilateral salpingo-oophorectomy, and pelvic lymphadenectomy on 09/05/2013. Final pathology showed a endocervical adenocarcinoma measuring 5 cm in diameter invading 1.85 cm of a total wall thickness of 2.0 cm. Lymph vascular space invasion was also noted. Surgical margins were clear and 29 lymph nodes  were negative. The patient had an uncomplicated postoperative course.  Given the intermediate risk factors postoperative pelvic radiation was recommended. Whole pelvis radiation therapy was completed on 11/28/2013.  Review of Systems:10 point review of systems is negative except as noted in interval history.   Vitals: Blood pressure 144/86, pulse 61, temperature 97.9 F (36.6 C), temperature source Oral, resp. rate 16, height 5' 1.5" (1.562 m), weight 136 lb 1.6 oz (61.735 kg), last menstrual period 08/19/2013.  Physical Exam: General : The patient is a healthy woman in no acute distress.  HEENT: normocephalic, extraoccular movements normal; neck is supple without thyromegally  Lynphnodes: Supraclavicular and inguinal nodes not enlarged  Abdomen: Soft, non-tender, no ascites, no organomegally, no masses, no hernias Pfannenstiel incision and suprapubic incisions are healed Pelvic:  EGBUS: Normal female  Vagina: Normal, no lesions  Urethra and Bladder: Normal, non-tender  Cervix: Surgically absent Uterus:  Surgically absent Bi-manual examination: Non-tender; no adenxal masses or nodularity  Rectal: normal sphincter tone, no masses, no blood  Lower extremities: Normal without any varicosities or edema     Allergies  Allergen Reactions  . Penicillins Other (See Comments)    unknown  . Sulfa Antibiotics Other (See Comments)    Numbness in lower extremeties  . Sulfonamide Derivatives     Past Medical History  Diagnosis Date  . Asthma     hx in childhood  . Hypertension   . Seasonal allergies   . History of radiation therapy 10/20/13-11/28/13    pelvis 50.4 gray  . Cervical cancer  Past Surgical History  Procedure Laterality Date  . Foot surgery    . Radical abdominal hysterectomy  12/13/114  . Bilateral salpingoophorectomy  09/06/13    Current Outpatient Prescriptions  Medication Sig Dispense Refill  . albuterol (VENTOLIN HFA) 108 (90 BASE) MCG/ACT inhaler INHALE TWO  TO FOUR PUFFS BY MOUTH EVERY 6 HOURS AS NEEDED 18 g 1  . budesonide-formoterol (SYMBICORT) 160-4.5 MCG/ACT inhaler INHALE TWO PUFFS INTO LUNGS TWICE DAILY 10.2 g 11  . cetirizine (ZYRTEC) 10 MG tablet Take 10 mg by mouth at bedtime.      . fluticasone (FLONASE) 50 MCG/ACT nasal spray Place 2 sprays into both nostrils daily.    . metoprolol (LOPRESSOR) 100 MG tablet TAKE ONE TABLET BY MOUTH TWICE DAILY 60 tablet 0  . PARoxetine (PAXIL) 20 MG tablet TAKE ONE TABLET BY MOUTH IN THE MORNING 90 tablet 0  . traZODone (DESYREL) 50 MG tablet TAKE ONE TABLET BY MOUTH AT BEDTIME 90 tablet 0  . [DISCONTINUED] prochlorperazine (COMPAZINE) 10 MG tablet Take 10 mg by mouth every 6 (six) hours as needed for nausea or vomiting. Called in to [facility] per Dr. Isidore Moos.  Disp. 30 tablets. 1 refill.     No current facility-administered medications for this visit.    History   Social History  . Marital Status: Married    Spouse Name: N/A  . Number of Children: 1  . Years of Education: N/A   Occupational History  .     Social History Main Topics  . Smoking status: Former Smoker    Quit date: 09/25/2004  . Smokeless tobacco: Not on file  . Alcohol Use: 0.5 oz/week    1 drink(s) per week  . Drug Use: No  . Sexual Activity: Yes   Other Topics Concern  . Not on file   Social History Narrative    Family History  Problem Relation Age of Onset  . Heart attack Father 51  . Diabetes Father   . Hypertension Maternal Grandmother   . Stroke Maternal Grandmother   . Cancer Mother     breast/thyroid      Anna Chapel, MD 02/08/2015, 1:54 PM

## 2015-02-08 NOTE — Patient Instructions (Addendum)
Please modify your diet to avoid relieving vegetables, fried foods, and milk products. Return to see me in 6 months. We will contact you with her Paps or report.

## 2015-02-08 NOTE — Addendum Note (Signed)
Addended by: Christa See on: 02/08/2015 02:07 PM   Modules accepted: Orders

## 2015-02-11 ENCOUNTER — Other Ambulatory Visit: Payer: Self-pay | Admitting: Family Medicine

## 2015-02-11 LAB — CYTOLOGY - PAP

## 2015-02-11 MED ORDER — TRAZODONE HCL 50 MG PO TABS: 50.0000 mg | ORAL_TABLET | Freq: Every day | ORAL | Status: DC

## 2015-02-15 ENCOUNTER — Telehealth: Payer: Self-pay | Admitting: Nurse Practitioner

## 2015-02-15 NOTE — Telephone Encounter (Signed)
Calling to give PAP results per Joylene John, NP. No answer, left message to call back.   Patient returned call. Normal PAP result given per NP above. Patient verbalizes understanding.

## 2015-02-19 ENCOUNTER — Ambulatory Visit: Payer: PRIVATE HEALTH INSURANCE | Admitting: Gynecology

## 2015-03-11 ENCOUNTER — Other Ambulatory Visit: Payer: Self-pay | Admitting: Family Medicine

## 2015-03-17 ENCOUNTER — Other Ambulatory Visit: Payer: Self-pay | Admitting: Gynecologic Oncology

## 2015-03-17 DIAGNOSIS — R3915 Urgency of urination: Secondary | ICD-10-CM

## 2015-03-17 NOTE — Progress Notes (Signed)
Patient called stating she has been having issues with discomfort on her bladder.  She feels like something is sitting on her bladder.  "I can't sleep because I feel like I have to pee but I don't think it is a UTI.  I am having no other symptoms."  Also reporting lower pelvic pain intermittently relieved with Aleve.  She states AZO helped some.  Denies fever, chills.  She is going to come in tomorrow at the [facility] to give a urine sample for analysis to rule out UTI.  Will follow up in the results and notify the patient of the plan.  Advised to call for any questions or concerns.

## 2015-03-18 ENCOUNTER — Telehealth: Payer: Self-pay | Admitting: *Deleted

## 2015-03-18 ENCOUNTER — Other Ambulatory Visit (HOSPITAL_BASED_OUTPATIENT_CLINIC_OR_DEPARTMENT_OTHER): Payer: PRIVATE HEALTH INSURANCE

## 2015-03-18 DIAGNOSIS — R3915 Urgency of urination: Secondary | ICD-10-CM | POA: Diagnosis not present

## 2015-03-18 LAB — URINALYSIS, MICROSCOPIC - CHCC
BILIRUBIN (URINE): NEGATIVE
Blood: NEGATIVE
COMMENTS:: <2
GLUCOSE UR CHCC: NEGATIVE mg/dL
Ketones: NEGATIVE mg/dL
Leukocyte Esterase: NEGATIVE
NITRITE: NEGATIVE
PH: 6 (ref 4.6–8.0)
Protein: <30 mg/dL
RBC / HPF: NEGATIVE (ref 0–2)
Specific Gravity, Urine: 1.025 (ref 1.003–1.035)
Urobilinogen, UR: 0.2 mg/dL (ref 0.2–1)

## 2015-03-18 NOTE — Telephone Encounter (Signed)
Per Joylene John, NP patient notified that UA did not show any signs of infection. Patient scheduled to see Dr. Denman George 04/02/15 at 1:30pm for further assessment - patient agreeable to appt and told to call our office prior this appt if she develops any additional symptoms or concerns.

## 2015-03-19 ENCOUNTER — Telehealth: Payer: Self-pay | Admitting: *Deleted

## 2015-03-19 ENCOUNTER — Ambulatory Visit: Payer: PRIVATE HEALTH INSURANCE | Admitting: Gynecology

## 2015-03-19 DIAGNOSIS — R1030 Lower abdominal pain, unspecified: Secondary | ICD-10-CM

## 2015-03-19 DIAGNOSIS — C539 Malignant neoplasm of cervix uteri, unspecified: Secondary | ICD-10-CM

## 2015-03-19 MED ORDER — TRAMADOL HCL 50 MG PO TABS: 50.0000 mg | ORAL_TABLET | Freq: Four times a day (QID) | ORAL | Status: DC | PRN

## 2015-03-19 NOTE — Telephone Encounter (Signed)
Received call from patient stating her lower abdominal pain/pressure is worse today. Per pt, "It feels like I have rocks on my bladder." Discussed with Joylene John, NP - patient scheduled to come in for appt with Dr. Denman George on Monday, 03/22/15, at 4pm (with 3:30pm arrival). Patient states the pain is not bad enough that she needs strong pain medication. Script for tramadol sent to patient's pharmacy. Instructed patient if the pain becomes severe over the weekend or she develops any additional symptoms to please report to the nearest emergency room - patient agreeable to this.   Discussed with patient that taking tramadol and paxil together can cause serotonin syndrome and that if she experiences any symptoms including confusion, restlessness, muscle spasms, fast heartbeat, vomiting or diarrhea to stop taking the tramadol and report to the nearest emergency room. Told patient this is rare but we just wanted to make her aware. Patient states she has taken tramadol many times in the past and has never experienced any problems but is agreeable to the above.

## 2015-03-22 ENCOUNTER — Encounter: Payer: Self-pay | Admitting: Gynecologic Oncology

## 2015-03-22 ENCOUNTER — Ambulatory Visit: Payer: PRIVATE HEALTH INSURANCE | Attending: Gynecologic Oncology | Admitting: Gynecologic Oncology

## 2015-03-22 DIAGNOSIS — C539 Malignant neoplasm of cervix uteri, unspecified: Secondary | ICD-10-CM | POA: Insufficient documentation

## 2015-03-22 MED ORDER — OXYCODONE-ACETAMINOPHEN 5-325 MG PO TABS: 1.0000 | ORAL_TABLET | ORAL | Status: DC | PRN

## 2015-03-22 NOTE — Progress Notes (Signed)
Consult Note: Gyn-Onc   Anna Ramsey 49 y.o. female  Chief Complaint  Patient presents with  . 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 New pelvic pain. Clinically free of disease. Normal UA.  Plan:   1/ CT abdomen and pelvis to evaluate for recurrence or mass. 2/ referral to urology if no recurrence is identified. 3/ percocet for pain.  Interval history:  The patient returns today with a 1 month history of gradually increasing central suprapubic discomfort. She has incomplete voiding, and constant pain. It has progressively been getting worse over the past month. Trammadol doesn't help it.   HPI:  49yo WMF seen in consultation at the request of Anna Ramsey regarding management of a newly diagnosed adenocarcinoma of the cervix.   The patient has a remote history of CIN treated with cryotherapy as well as a LEEP procedure.  .  Since March 2014, she has had a clear vaginal discharge that has become more profuse.    In June she had an AGUS pap smear.  Colposcopy in July was negative as was an ECC (path reviewed) An ultrasound in November 2014 shows a thickening.  A repeat ECC produced abundant tissue which was an adenocarcinoma.  A CT scan shows no adenopathy but an enlarged cervix mass measuring 5.1x4.4x5.2 cm.  There was also a simple 5 cm ovarian cyst. She underwent a radical hysterectomy, bilateral salpingo-oophorectomy, and pelvic lymphadenectomy on 09/05/2013. Final pathology showed a endocervical adenocarcinoma measuring 5 cm in diameter invading 1.85 cm of a total wall thickness of 2.0 cm. Lymph vascular space invasion was also noted. Surgical margins were clear and 29 lymph nodes were negative. The patient had an uncomplicated postoperative course.  Given the intermediate risk factors postoperative pelvic radiation was recommended. Whole pelvis  radiation therapy was completed on 11/28/2013.  Review of Systems:10 point review of systems is negative except as noted in interval history.   Vitals: Last menstrual period 08/19/2013.  Physical Exam: General : The patient is a healthy woman in no acute distress.  HEENT: normocephalic, extraoccular movements normal; neck is supple without thyromegally  Lynphnodes: Supraclavicular and inguinal nodes not enlarged  Abdomen: Soft, non-tender, no ascites, no organomegally, no masses, no hernias Pfannenstiel incision and suprapubic incisions are healed. Exquisitely tender suprapubic abdomen. No rebound or guarding, no palpable masses.  Pelvic:  EGBUS: Normal female  Vagina: Normal, no lesions, vaginal tissues indurated (consistent with radiation changes) but no discrete lesions or mass   Urethra and Bladder: tenderness with abdominal hand, not vaginal hand Cervix: Surgically absent Uterus:  Surgically absent Bi-manual examination: Non-tender; no adenxal masses or nodularity  Rectal: normal sphincter tone, no masses, no blood  Lower extremities: Normal without any varicosities or edema     Allergies  Allergen Reactions  . Penicillins Other (See Comments)    unknown  . Sulfa Antibiotics Other (See Comments)    Numbness in lower extremeties  . Sulfonamide Derivatives     Past Medical History  Diagnosis Date  . Asthma     hx in childhood  . Hypertension   . Seasonal allergies   . History of radiation therapy 10/20/13-11/28/13    pelvis 50.4 gray  . Cervical cancer     Past Surgical History  Procedure Laterality Date  . Foot surgery    . Radical abdominal hysterectomy  12/13/114  . Bilateral salpingoophorectomy  09/06/13    Current Outpatient  Prescriptions  Medication Sig Dispense Refill  . albuterol (VENTOLIN HFA) 108 (90 BASE) MCG/ACT inhaler INHALE TWO TO FOUR PUFFS BY MOUTH EVERY 6 HOURS AS NEEDED 18 g 1  . budesonide-formoterol (SYMBICORT) 160-4.5 MCG/ACT inhaler INHALE  TWO PUFFS INTO LUNGS TWICE DAILY 10.2 g 11  . cetirizine (ZYRTEC) 10 MG tablet Take 10 mg by mouth at bedtime.      . fluticasone (FLONASE) 50 MCG/ACT nasal spray Place 2 sprays into both nostrils daily.    . metoprolol (LOPRESSOR) 100 MG tablet TAKE ONE TABLET BY MOUTH TWICE DAILY 60 tablet 0  . PARoxetine (PAXIL) 20 MG tablet TAKE ONE TABLET BY MOUTH IN THE MORNING 90 tablet 0  . traMADol (ULTRAM) 50 MG tablet Take 1-2 tablets (50-100 mg total) by mouth every 6 (six) hours as needed (pain). 30 tablet 0  . traZODone (DESYREL) 50 MG tablet Take 1 tablet (50 mg total) by mouth at bedtime. 90 tablet 0  . [DISCONTINUED] prochlorperazine (COMPAZINE) 10 MG tablet Take 10 mg by mouth every 6 (six) hours as needed for nausea or vomiting. Called in to [facility] per Dr. Isidore Moos.  Disp. 30 tablets. 1 refill.     No current facility-administered medications for this visit.    History   Social History  . Marital Status: Married    Spouse Name: N/A  . Number of Children: 1  . Years of Education: N/A   Occupational History  .     Social History Main Topics  . Smoking status: Former Smoker    Quit date: 09/25/2004  . Smokeless tobacco: Not on file  . Alcohol Use: 0.5 oz/week    1 Standard drinks or equivalent per week  . Drug Use: No  . Sexual Activity: Yes   Other Topics Concern  . Not on file   Social History Narrative    Family History  Problem Relation Age of Onset  . Heart attack Father 15  . Diabetes Father   . Hypertension Maternal Grandmother   . Stroke Maternal Grandmother   . Cancer Mother     breast/thyroid      Anna Eva, MD 03/22/2015, 4:08 PM

## 2015-03-23 ENCOUNTER — Encounter (HOSPITAL_COMMUNITY): Payer: Self-pay

## 2015-03-23 ENCOUNTER — Ambulatory Visit (HOSPITAL_COMMUNITY)
Admission: RE | Admit: 2015-03-23 | Discharge: 2015-03-23 | Disposition: A | Discharge status: Home / Self Care | Payer: PRIVATE HEALTH INSURANCE | Source: Ambulatory Visit | Attending: Gynecologic Oncology | Admitting: Gynecologic Oncology

## 2015-03-23 DIAGNOSIS — C539 Malignant neoplasm of cervix uteri, unspecified: Secondary | ICD-10-CM

## 2015-03-23 DIAGNOSIS — Z923 Personal history of irradiation: Secondary | ICD-10-CM | POA: Insufficient documentation

## 2015-03-23 DIAGNOSIS — R1031 Right lower quadrant pain: Secondary | ICD-10-CM | POA: Insufficient documentation

## 2015-03-23 DIAGNOSIS — Z90710 Acquired absence of both cervix and uterus: Secondary | ICD-10-CM | POA: Diagnosis not present

## 2015-03-23 DIAGNOSIS — Z8541 Personal history of malignant neoplasm of cervix uteri: Secondary | ICD-10-CM | POA: Insufficient documentation

## 2015-03-23 MED ORDER — IOHEXOL 300 MG/ML  SOLN
100.0000 mL | Freq: Once | INTRAMUSCULAR | Status: AC | PRN
  Administered 2015-03-23: 100 mL via INTRAVENOUS

## 2015-03-24 ENCOUNTER — Other Ambulatory Visit: Payer: Self-pay | Admitting: *Deleted

## 2015-03-24 ENCOUNTER — Telehealth: Payer: Self-pay | Admitting: *Deleted

## 2015-03-24 DIAGNOSIS — C539 Malignant neoplasm of cervix uteri, unspecified: Secondary | ICD-10-CM

## 2015-03-24 DIAGNOSIS — N3040 Irradiation cystitis without hematuria: Secondary | ICD-10-CM

## 2015-03-24 NOTE — Telephone Encounter (Signed)
Per Dr. Denman George, patient notified that CT scan shows no evidence of cancer and that we will send a urology referral for further evaluation of her lower abdominal discomfort.  Referral faxed to [facility] along with MD notes and recent CT scan results - once appt is scheduled, patient will be notified.

## 2015-03-26 ENCOUNTER — Telehealth: Payer: Self-pay | Admitting: *Deleted

## 2015-03-26 NOTE — Telephone Encounter (Signed)
Spoke with Sharl Ma at [facility] - patient scheduled 04/02/15 at 9:15am with Dr. Alyson Ingles. Patient notified of appt and she is agreeable to appt. No other questions or concerns noted at this time.

## 2015-04-01 ENCOUNTER — Encounter: Payer: Self-pay | Admitting: Family Medicine

## 2015-04-02 ENCOUNTER — Ambulatory Visit: Payer: PRIVATE HEALTH INSURANCE | Admitting: Gynecologic Oncology

## 2015-04-02 ENCOUNTER — Other Ambulatory Visit (HOSPITAL_COMMUNITY): Payer: Self-pay | Admitting: Urology

## 2015-04-02 DIAGNOSIS — Q644 Malformation of urachus: Secondary | ICD-10-CM

## 2015-04-12 ENCOUNTER — Other Ambulatory Visit (HOSPITAL_COMMUNITY): Payer: PRIVATE HEALTH INSURANCE

## 2015-04-12 ENCOUNTER — Ambulatory Visit (HOSPITAL_COMMUNITY)
Admission: RE | Admit: 2015-04-12 | Discharge: 2015-04-12 | Disposition: A | Discharge status: Home / Self Care | Payer: PRIVATE HEALTH INSURANCE | Source: Ambulatory Visit | Attending: Urology | Admitting: Urology

## 2015-04-12 DIAGNOSIS — Z8541 Personal history of malignant neoplasm of cervix uteri: Secondary | ICD-10-CM | POA: Diagnosis not present

## 2015-04-12 DIAGNOSIS — Q644 Malformation of urachus: Secondary | ICD-10-CM

## 2015-04-12 DIAGNOSIS — R102 Pelvic and perineal pain: Secondary | ICD-10-CM | POA: Insufficient documentation

## 2015-04-12 LAB — POCT I-STAT CREATININE: CREATININE: 0.7 mg/dL (ref 0.44–1.00)

## 2015-04-12 MED ORDER — GADOBENATE DIMEGLUMINE 529 MG/ML IV SOLN
15.0000 mL | Freq: Once | INTRAVENOUS | Status: AC | PRN
  Administered 2015-04-12: 12 mL via INTRAVENOUS

## 2015-04-14 ENCOUNTER — Inpatient Hospital Stay (HOSPITAL_COMMUNITY): Admission: RE | Admit: 2015-04-14 | Payer: PRIVATE HEALTH INSURANCE | Source: Ambulatory Visit

## 2015-04-16 ENCOUNTER — Ambulatory Visit: Payer: PRIVATE HEALTH INSURANCE | Admitting: Family Medicine

## 2015-04-16 ENCOUNTER — Other Ambulatory Visit: Payer: Self-pay | Admitting: Family Medicine

## 2015-04-21 ENCOUNTER — Telehealth: Payer: Self-pay | Admitting: *Deleted

## 2015-04-21 NOTE — Telephone Encounter (Signed)
Received office note from patient's visit with Dr. Alyson Ingles on 04/20/15. Original placed in patient's chart and copy sent to HIM to be scanned into patient's EMR.

## 2015-04-22 ENCOUNTER — Other Ambulatory Visit: Payer: Self-pay | Admitting: Urology

## 2015-04-23 ENCOUNTER — Ambulatory Visit (INDEPENDENT_AMBULATORY_CARE_PROVIDER_SITE_OTHER): Payer: 59 | Admitting: Family Medicine

## 2015-04-23 ENCOUNTER — Encounter: Payer: Self-pay | Admitting: Family Medicine

## 2015-04-23 VITALS — BP 119/84 | HR 73 | Ht 61.5 in | Wt 134.0 lb

## 2015-04-23 DIAGNOSIS — Z114 Encounter for screening for human immunodeficiency virus [HIV]: Secondary | ICD-10-CM

## 2015-04-23 DIAGNOSIS — E785 Hyperlipidemia, unspecified: Secondary | ICD-10-CM | POA: Diagnosis not present

## 2015-04-23 DIAGNOSIS — I10 Essential (primary) hypertension: Secondary | ICD-10-CM | POA: Diagnosis not present

## 2015-04-23 DIAGNOSIS — J4520 Mild intermittent asthma, uncomplicated: Secondary | ICD-10-CM | POA: Diagnosis not present

## 2015-04-23 DIAGNOSIS — N3040 Irradiation cystitis without hematuria: Secondary | ICD-10-CM

## 2015-04-23 LAB — LIPID PANEL
Cholesterol: 189 mg/dL (ref 125–200)
HDL: 47 mg/dL (ref >=46)
LDL CALC: 108 mg/dL (ref <130)
Total CHOL/HDL Ratio: 4 Ratio (ref <=5.0)
Triglycerides: 168 mg/dL — ABNORMAL HIGH (ref <150)
VLDL: 34 mg/dL — AB (ref <30)

## 2015-04-23 LAB — COMPLETE METABOLIC PANEL WITH GFR
ALK PHOS: 95 U/L (ref 33–115)
ALT: 13 U/L (ref 6–29)
AST: 18 U/L (ref 10–35)
Albumin: 4.3 g/dL (ref 3.6–5.1)
BUN: 8 mg/dL (ref 7–25)
CALCIUM: 9.8 mg/dL (ref 8.6–10.2)
CO2: 30 mmol/L (ref 20–31)
Chloride: 104 mmol/L (ref 98–110)
Creat: 0.74 mg/dL (ref 0.50–1.10)
GFR, Est Non African American: >89 mL/min (ref >=60)
Glucose, Bld: 96 mg/dL (ref 65–99)
POTASSIUM: 4.3 mmol/L (ref 3.5–5.3)
Sodium: 143 mmol/L (ref 135–146)
TOTAL PROTEIN: 7 g/dL (ref 6.1–8.1)
Total Bilirubin: 0.5 mg/dL (ref 0.2–1.2)

## 2015-04-23 LAB — HIV ANTIBODY (ROUTINE TESTING W REFLEX): HIV 1&2 Ab, 4th Generation: NONREACTIVE

## 2015-04-23 MED ORDER — BECLOMETHASONE DIPROPIONATE 80 MCG/ACT IN AERS: 2.0000 | INHALATION_SPRAY | Freq: Two times a day (BID) | RESPIRATORY_TRACT | Status: DC

## 2015-04-23 NOTE — Progress Notes (Signed)
   Subjective:    Patient ID: Anna Ramsey, female    DOB: [birth year], 49 y.o.   MRN: [id number]  HPI F/U Asthma - rarely using her albuterol. She is on symbicort.    Hypertension- Pt denies chest pain, SOB, dizziness, or heart palpitations.  Taking meds as directed w/o problems.  Denies medication side effects.    Hyperlipidemia-not currently on a statin. Lab Results  Component Value Date   CHOL 212* 04/07/2014   HDL 67 04/07/2014   LDLCALC 125* 04/07/2014   TRIG 102 04/07/2014   CHOLHDL 3.2 04/07/2014     Review of Systems     Objective:   Physical Exam  Constitutional: She is oriented to person, place, and time. She appears well-developed and well-nourished.  HENT:  Head: Normocephalic and atraumatic.  Cardiovascular: Normal rate, regular rhythm and normal heart sounds.   Pulmonary/Chest: Effort normal and breath sounds normal.  Neurological: She is alert and oriented to person, place, and time.  Skin: Skin is warm and dry.  Psychiatric: She has a normal mood and affect. Her behavior is normal.          Assessment & Plan:  Asthma - mild intermittent she is rarely using her albuterol. Will going ahead and stepdown her Symbicort to just an inhaled corticosteroid. Her insurance recommendation we will switch her to Qvar. New prescriptions sent. Follow-up in 6 months.  HTN - well controlled continue current regimen. Due for CMP and fasting lipid panel. Follow-up in 6 months. Next  Hyperlipidemia-due to recheck lipid panel.  She did update me today. She was treated with radiation therapy for adenocarcinoma of the cervix that had metastasized. She's been having some persistent pelvic discomfort and pressure and need to urinate. They did do an MRI recently and found that her bladder is pulled upwards and to the right side. They think that it could either be scar tissue. She recently had cystoscopy to take a look on the inside of the bladder. She did have an area  that was suspicious for possible cancer versus radiation side effects. She will have biopsy scheduled for next week.

## 2015-04-26 ENCOUNTER — Encounter (HOSPITAL_BASED_OUTPATIENT_CLINIC_OR_DEPARTMENT_OTHER): Payer: Self-pay | Admitting: *Deleted

## 2015-04-27 ENCOUNTER — Encounter (HOSPITAL_BASED_OUTPATIENT_CLINIC_OR_DEPARTMENT_OTHER): Payer: Self-pay | Admitting: *Deleted

## 2015-04-27 NOTE — Progress Notes (Signed)
NPO AFTER MN.  ARRIVE AT 0930.  NEEDS HG AND EKG.  CURRENT CMET RESULTS IN CHART AND EPIC.  WILL TAKE AM MEDS AND INHALER DOS W/ SIPS OF WATER.

## 2015-04-29 ENCOUNTER — Other Ambulatory Visit: Payer: Self-pay

## 2015-04-29 ENCOUNTER — Ambulatory Visit (HOSPITAL_BASED_OUTPATIENT_CLINIC_OR_DEPARTMENT_OTHER): Payer: PRIVATE HEALTH INSURANCE | Admitting: Anesthesiology

## 2015-04-29 ENCOUNTER — Encounter (HOSPITAL_BASED_OUTPATIENT_CLINIC_OR_DEPARTMENT_OTHER)
Admission: RE | Disposition: A | Discharge status: Home / Self Care | Payer: Self-pay | Source: Ambulatory Visit | Attending: Urology

## 2015-04-29 ENCOUNTER — Ambulatory Visit (HOSPITAL_BASED_OUTPATIENT_CLINIC_OR_DEPARTMENT_OTHER)
Admission: RE | Admit: 2015-04-29 | Discharge: 2015-04-29 | Disposition: A | Discharge status: Home / Self Care | Payer: PRIVATE HEALTH INSURANCE | Source: Ambulatory Visit | Attending: Urology | Admitting: Urology

## 2015-04-29 ENCOUNTER — Encounter (HOSPITAL_BASED_OUTPATIENT_CLINIC_OR_DEPARTMENT_OTHER): Payer: Self-pay | Admitting: *Deleted

## 2015-04-29 DIAGNOSIS — N329 Bladder disorder, unspecified: Secondary | ICD-10-CM | POA: Diagnosis present

## 2015-04-29 DIAGNOSIS — C539 Malignant neoplasm of cervix uteri, unspecified: Secondary | ICD-10-CM

## 2015-04-29 DIAGNOSIS — Z880 Allergy status to penicillin: Secondary | ICD-10-CM | POA: Insufficient documentation

## 2015-04-29 DIAGNOSIS — Z90710 Acquired absence of both cervix and uterus: Secondary | ICD-10-CM | POA: Diagnosis not present

## 2015-04-29 DIAGNOSIS — Z882 Allergy status to sulfonamides status: Secondary | ICD-10-CM | POA: Diagnosis not present

## 2015-04-29 DIAGNOSIS — Z923 Personal history of irradiation: Secondary | ICD-10-CM | POA: Insufficient documentation

## 2015-04-29 DIAGNOSIS — Z8541 Personal history of malignant neoplasm of cervix uteri: Secondary | ICD-10-CM | POA: Diagnosis not present

## 2015-04-29 DIAGNOSIS — I10 Essential (primary) hypertension: Secondary | ICD-10-CM | POA: Diagnosis not present

## 2015-04-29 DIAGNOSIS — J45909 Unspecified asthma, uncomplicated: Secondary | ICD-10-CM | POA: Diagnosis not present

## 2015-04-29 DIAGNOSIS — R300 Dysuria: Secondary | ICD-10-CM | POA: Diagnosis not present

## 2015-04-29 DIAGNOSIS — N3289 Other specified disorders of bladder: Secondary | ICD-10-CM | POA: Diagnosis not present

## 2015-04-29 DIAGNOSIS — Z87891 Personal history of nicotine dependence: Secondary | ICD-10-CM | POA: Insufficient documentation

## 2015-04-29 HISTORY — DX: Personal history of malignant neoplasm of cervix uteri: Z85.41

## 2015-04-29 HISTORY — DX: Mild intermittent asthma, uncomplicated: J45.20

## 2015-04-29 HISTORY — DX: Cardiac murmur, unspecified: R01.1

## 2015-04-29 HISTORY — DX: Hematuria, unspecified: R31.9

## 2015-04-29 HISTORY — DX: Other specified disorders of bladder: N32.89

## 2015-04-29 HISTORY — PX: CYSTOSCOPY WITH BIOPSY: SHX5122

## 2015-04-29 HISTORY — DX: Bladder disorder, unspecified: N32.9

## 2015-04-29 HISTORY — DX: Dysuria: R30.0

## 2015-04-29 LAB — POCT HEMOGLOBIN-HEMACUE: Hemoglobin: 15 g/dL (ref 12.0–15.0)

## 2015-04-29 SURGERY — CYSTOSCOPY, WITH BIOPSY
Anesthesia: General

## 2015-04-29 MED ORDER — MIDAZOLAM HCL 2 MG/2ML IJ SOLN
INTRAMUSCULAR | Status: AC
  Filled 2015-04-29: qty 2

## 2015-04-29 MED ORDER — CEFAZOLIN SODIUM-DEXTROSE 2-3 GM-% IV SOLR
2.0000 g | INTRAVENOUS | Status: AC
  Administered 2015-04-29: 2 g via INTRAVENOUS
  Filled 2015-04-29: qty 50

## 2015-04-29 MED ORDER — LACTATED RINGERS IV SOLN
INTRAVENOUS | Status: DC
  Administered 2015-04-29: 10:00:00 via INTRAVENOUS
  Filled 2015-04-29: qty 1000

## 2015-04-29 MED ORDER — FENTANYL CITRATE (PF) 100 MCG/2ML IJ SOLN
INTRAMUSCULAR | Status: AC
  Filled 2015-04-29: qty 6

## 2015-04-29 MED ORDER — LIDOCAINE HCL (CARDIAC) 20 MG/ML IV SOLN
INTRAVENOUS | Status: DC | PRN
  Administered 2015-04-29: 60 mg via INTRAVENOUS

## 2015-04-29 MED ORDER — MIDAZOLAM HCL 5 MG/5ML IJ SOLN
INTRAMUSCULAR | Status: DC | PRN
  Administered 2015-04-29: 2 mg via INTRAVENOUS

## 2015-04-29 MED ORDER — STERILE WATER FOR IRRIGATION IR SOLN
Status: DC | PRN
  Administered 2015-04-29: 3000 mL via INTRAVESICAL

## 2015-04-29 MED ORDER — PROPOFOL 10 MG/ML IV BOLUS
INTRAVENOUS | Status: DC | PRN
  Administered 2015-04-29: 170 mg via INTRAVENOUS

## 2015-04-29 MED ORDER — OXYCODONE-ACETAMINOPHEN 5-325 MG PO TABS: 1.0000 | ORAL_TABLET | ORAL | Status: DC | PRN

## 2015-04-29 MED ORDER — DEXAMETHASONE SODIUM PHOSPHATE 4 MG/ML IJ SOLN
INTRAMUSCULAR | Status: DC | PRN
  Administered 2015-04-29: 10 mg via INTRAVENOUS

## 2015-04-29 MED ORDER — MEPERIDINE HCL 25 MG/ML IJ SOLN
6.2500 mg | INTRAMUSCULAR | Status: DC | PRN
  Filled 2015-04-29: qty 1

## 2015-04-29 MED ORDER — PROMETHAZINE HCL 25 MG/ML IJ SOLN
6.2500 mg | INTRAMUSCULAR | Status: DC | PRN
  Filled 2015-04-29: qty 1

## 2015-04-29 MED ORDER — CEFAZOLIN SODIUM-DEXTROSE 2-3 GM-% IV SOLR
INTRAVENOUS | Status: AC
  Filled 2015-04-29: qty 50

## 2015-04-29 MED ORDER — FENTANYL CITRATE (PF) 100 MCG/2ML IJ SOLN
25.0000 ug | INTRAMUSCULAR | Status: DC | PRN
  Filled 2015-04-29: qty 1

## 2015-04-29 MED ORDER — ONDANSETRON HCL 4 MG/2ML IJ SOLN
INTRAMUSCULAR | Status: DC | PRN
  Administered 2015-04-29: 4 mg via INTRAVENOUS

## 2015-04-29 MED ORDER — LACTATED RINGERS IV SOLN
INTRAVENOUS | Status: DC
  Filled 2015-04-29: qty 1000

## 2015-04-29 MED ORDER — FENTANYL CITRATE (PF) 100 MCG/2ML IJ SOLN
INTRAMUSCULAR | Status: DC | PRN
  Administered 2015-04-29: 50 ug via INTRAVENOUS

## 2015-04-29 MED ORDER — CEFAZOLIN SODIUM 1-5 GM-% IV SOLN
1.0000 g | INTRAVENOUS | Status: DC
  Filled 2015-04-29: qty 50

## 2015-04-29 SURGICAL SUPPLY — 24 items
BAG DRAIN URO-CYSTO SKYTR STRL (DRAIN) ×1 IMPLANT
BAG DRN UROCATH (DRAIN) ×1
BAG URINE DRAINAGE (UROLOGICAL SUPPLIES) ×1 IMPLANT
BAG URINE LEG 500ML (DRAIN) ×1 IMPLANT
BALLN NEPHROSTOMY (BALLOONS)
BALLOON NEPHROSTOMY (BALLOONS) IMPLANT
CANISTER SUCT LVC 12 LTR MEDI- (MISCELLANEOUS) ×1 IMPLANT
CATH FOLEY 2WAY SLVR  5CC 16FR (CATHETERS) ×1
CATH FOLEY 2WAY SLVR 5CC 16FR (CATHETERS) IMPLANT
CLOTH BEACON ORANGE TIMEOUT ST (SAFETY) ×2 IMPLANT
GLOVE BIO SURGEON STRL SZ7.5 (GLOVE) ×2 IMPLANT
GLOVE BIO SURGEON STRL SZ8 (GLOVE) ×2 IMPLANT
GLOVE INDICATOR 7.5 STRL GRN (GLOVE) ×1 IMPLANT
GLOVE SURG SS PI 7.5 STRL IVOR (GLOVE) ×1 IMPLANT
GOWN STRL REUS W/ TWL XL LVL3 (GOWN DISPOSABLE) ×1 IMPLANT
GOWN STRL REUS W/TWL LRG LVL3 (GOWN DISPOSABLE) ×1 IMPLANT
GOWN STRL REUS W/TWL XL LVL3 (GOWN DISPOSABLE) ×2
GUIDEWIRE 0.038 PTFE COATED (WIRE) IMPLANT
GUIDEWIRE ANG ZIPWIRE 038X150 (WIRE) ×1 IMPLANT
GUIDEWIRE STR DUAL SENSOR (WIRE) IMPLANT
NS IRRIG 500ML POUR BTL (IV SOLUTION) IMPLANT
PACK CYSTO (CUSTOM PROCEDURE TRAY) ×2 IMPLANT
SYRINGE IRR TOOMEY STRL 70CC (SYRINGE) IMPLANT
WATER STERILE IRR 3000ML UROMA (IV SOLUTION) ×2 IMPLANT

## 2015-04-29 NOTE — Anesthesia Procedure Notes (Signed)
Procedure Name: LMA Insertion Date/Time: 04/29/2015 10:51 AM Performed by: Denna Haggard D Pre-anesthesia Checklist: Patient identified, Emergency Drugs available, Suction available and Patient being monitored Patient Re-evaluated:Patient Re-evaluated prior to inductionOxygen Delivery Method: Circle System Utilized Preoxygenation: Pre-oxygenation with 100% oxygen Intubation Type: IV induction Ventilation: Mask ventilation without difficulty LMA: LMA inserted LMA Size: 4.0 Number of attempts: 1 Airway Equipment and Method: Bite block Placement Confirmation: positive ETCO2 Tube secured with: Tape Dental Injury: Teeth and Oropharynx as per pre-operative assessment

## 2015-04-29 NOTE — Op Note (Signed)
Preoperative diagnosis: bladder lesions  Postoperative diagnosis: Same  Procedure: 1 cystoscopy 2. 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 Ureteral orifices in normal anatomic location.  Indications: Patient is a 49 year old female with a history of dysuria, cervical cancer and pelvic radiation and on office cystoscopy was found to have multiple posterior wall bladder lesions. After discussing treatment options, they decided proceed with bladder biopsy.  Procedure her in detail: The patient was brought to the operating room and a brief timeout was done to ensure correct patient, correct procedure, correct site. General anesthesia was administered patient was placed in dorsal lithotomy position. Their genitalia was then prepped and draped in usual sterile fashion. A rigid 47 French cystoscope was passed in the urethra and the bladder. 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 the ureteral orifices were in the normal orthotopic locations.  We then use the cold cup biopsy to obtain 2 biopsies from the posterior wall and 1 biopsy of the dome. We then used the bugbee to fulgerate the biopsy sites.Hemostasis was then obtained with electrocautery. We then re-inspected the bladder and found no residual bleeding. the bladder was then drained, a 16 French foley was placed and this concluded the procedure which was well tolerated by patient.  Complications: None  Condition: Stable, extubated, transferred to PACU  Plan: Patient is to be discharged home. She is to followup in 4 days for voiding trial

## 2015-04-29 NOTE — Transfer of Care (Signed)
Immediate Anesthesia Transfer of Care Note  Patient: Anna Ramsey  Procedure(s) Performed: Procedure(s) (LRB): CYSTOSCOPY WITH BIOPSY WITH FULGERATION WITH GYRUS  (N/A)  Patient Location: PACU  Anesthesia Type: General  Level of Consciousness: awake, oriented, sedated and patient cooperative  Airway & Oxygen Therapy: Patient Spontanous Breathing and Patient connected to face mask oxygen  Post-op Assessment: Report given to PACU RN and Post -op Vital signs reviewed and stable  Post vital signs: Reviewed and stable  Complications: No apparent anesthesia complications

## 2015-04-29 NOTE — Anesthesia Preprocedure Evaluation (Addendum)
Anesthesia Evaluation  Patient identified by MRN, date of birth, ID band Patient awake    Reviewed: Allergy & Precautions, NPO status , Patient's Chart, lab work & pertinent test results  Airway Mallampati: II  TM Distance: >3 FB Neck ROM: Full    Dental no notable dental hx.    Pulmonary asthma , former smoker,  breath sounds clear to auscultation  Pulmonary exam normal       Cardiovascular hypertension, Pt. on medications Normal cardiovascular examRhythm:Regular Rate:Normal     Neuro/Psych negative neurological ROS  negative psych ROS   GI/Hepatic negative GI ROS, Neg liver ROS,   Endo/Other  negative endocrine ROS  Renal/GU negative Renal ROS  negative genitourinary   Musculoskeletal negative musculoskeletal ROS (+)   Abdominal   Peds negative pediatric ROS (+)  Hematology negative hematology ROS (+)   Anesthesia Other Findings   Reproductive/Obstetrics negative OB ROS                            Anesthesia Physical Anesthesia Plan  ASA: II  Anesthesia Plan: General   Post-op Pain Management:    Induction: Intravenous  Airway Management Planned: LMA  Additional Equipment:   Intra-op Plan:   Post-operative Plan: Extubation in OR  Informed Consent: I have reviewed the patients History and Physical, chart, labs and discussed the procedure including the risks, benefits and alternatives for the proposed anesthesia with the patient or authorized representative who has indicated his/her understanding and acceptance.   Dental advisory given  Plan Discussed with: CRNA  Anesthesia Plan Comments:         Anesthesia Quick Evaluation

## 2015-04-29 NOTE — Brief Op Note (Signed)
04/29/2015  11:09 AM  PATIENT:  Anna Ramsey  49 y.o. female  PRE-OPERATIVE DIAGNOSIS:  BLADDER ERYTHEMA, LESION  POST-OPERATIVE DIAGNOSIS:  BLADDER ERYTHEMA, LESION  PROCEDURE:  Procedure(s) with comments: CYSTOSCOPY WITH BIOPSY WITH FULGERATION WITH GYRUS  (N/A) - 1 HR  NEEDS GYRUS [phone] [id number]  SURGEON:  Surgeon(s) and Role:    * [name], MD - Primary  PHYSICIAN ASSISTANT:   ASSISTANTS: none   ANESTHESIA:   general  EBL:  Total I/O In: 200 [I.V.:200] Out: -   BLOOD ADMINISTERED:none  DRAINS: Urinary Catheter (Foley)   LOCAL MEDICATIONS USED:  NONE  SPECIMEN:  Source of Specimen:  bladder posterior wall and dome  DISPOSITION OF SPECIMEN:  PATHOLOGY  COUNTS:  YES  TOURNIQUET:  * No tourniquets in log *  DICTATION: .Note written in EPIC  PLAN OF CARE: Discharge to home after PACU  PATIENT DISPOSITION:  PACU - hemodynamically stable.   Delay start of Pharmacological VTE agent (>24hrs) due to surgical blood loss or risk of bleeding: not applicable

## 2015-04-29 NOTE — Discharge Instructions (Signed)
Foley Catheter Care °A Foley catheter is a soft, flexible tube that is placed into the bladder to drain urine. A Foley catheter may be inserted if: °· You leak urine or are not able to control when you urinate (urinary incontinence). °· You are not able to urinate when you need to (urinary retention). °· You had prostate surgery or surgery on the genitals. °· You have certain medical conditions, such as multiple sclerosis, dementia, or a spinal cord injury. °If you are going home with a Foley catheter in place, follow the instructions below. °TAKING CARE OF THE CATHETER °1. Wash your hands with soap and water. °2. Using mild soap and warm water on a clean washcloth: °· Clean the area on your body closest to the catheter insertion site using a circular motion, moving away from the catheter. Never wipe toward the catheter because this could sweep bacteria up into the urethra and cause infection. °· Remove all traces of soap. Pat the area dry with a clean towel. For males, reposition the foreskin. °3. Attach the catheter to your leg so there is no tension on the catheter. Use adhesive tape or a leg strap. If you are using adhesive tape, remove any sticky residue left behind by the previous tape you used. °4. Keep the drainage bag below the level of the bladder, but keep it off the floor. °5. Check throughout the day to be sure the catheter is working and urine is draining freely. Make sure the tubing does not become kinked. °6. Do not pull on the catheter or try to remove it. Pulling could damage internal tissues. °TAKING CARE OF THE DRAINAGE BAGS °You will be given two drainage bags to take home. One is a large overnight drainage bag, and the other is a smaller leg bag that fits underneath clothing. You may wear the overnight bag at any time, but you should never wear the smaller leg bag at night. Follow the instructions below for how to empty, change, and clean your drainage bags. °Emptying the Drainage Bag °You must  empty your drainage bag when it is  -½ full or at least 2-3 times a day. °1. Wash your hands with soap and water. °2. Keep the drainage bag below your hips, below the level of your bladder. This stops urine from going back into the tubing and into your bladder. °3. Hold the dirty bag over the toilet or a clean container. °4. Open the pour spout at the bottom of the bag and empty the urine into the toilet or container. Do not let the pour spout touch the toilet, container, or any other surface. Doing so can place bacteria on the bag, which can cause an infection. °5. Clean the pour spout with a gauze pad or cotton ball that has rubbing alcohol on it. °6. Close the pour spout. °7. Attach the bag to your leg with adhesive tape or a leg strap. °8. Wash your hands well. °Changing the Drainage Bag °Change your drainage bag once a month or sooner if it starts to smell bad or look dirty. Below are steps to follow when changing the drainage bag. °1. Wash your hands with soap and water. °2. Pinch off the rubber catheter so that urine does not spill out. °3. Disconnect the catheter tube from the drainage tube at the connection valve. Do not let the tubes touch any surface. °4. Clean the end of the catheter tube with an alcohol wipe. Use a different alcohol wipe to clean the   end of the drainage tube. 5. Connect the catheter tube to the drainage tube of the clean drainage bag. 6. Attach the new bag to the leg with adhesive tape or a leg strap. Avoid attaching the new bag too tightly. 7. Wash your hands well. Cleaning the Drainage Bag 1. Wash your hands with soap and water. 2. Wash the bag in warm, soapy water. 3. Rinse the bag thoroughly with warm water. 4. Fill the bag with a solution of white vinegar and water (1 cup vinegar to 1 qt warm water [.2 L vinegar to 1 L warm water]). Close the bag and soak it for 30 minutes in the solution. 5. Rinse the bag with warm water. 6. Hang the bag to dry with the pour spout open  and hanging downward. 7. Store the clean bag (once it is dry) in a clean plastic bag. 8. Wash your hands well. PREVENTING INFECTION  Wash your hands before and after handling your catheter.  Take showers daily and wash the area where the catheter enters your body. Do not take baths. Replace wet leg straps with dry ones, if this applies.  Do not use powders, sprays, or lotions on the genital area. Only use creams, lotions, or ointments as directed by your caregiver.  For females, wipe from front to back after each bowel movement.  Drink enough fluids to keep your urine clear or pale yellow unless you have a fluid restriction.  Do not let the drainage bag or tubing touch or lie on the floor.  Wear cotton underwear to absorb moisture and to keep your skin drier. SEEK MEDICAL CARE IF:   Your urine is cloudy or smells unusually bad.  Your catheter becomes clogged.  You are not draining urine into the bag or your bladder feels full.  Your catheter starts to leak. SEEK IMMEDIATE MEDICAL CARE IF:   You have pain, swelling, redness, or pus where the catheter enters the body.  You have pain in the abdomen, legs, lower back, or bladder.  You have a fever.  You see blood fill the catheter, or your urine is pink or red.  You have nausea, vomiting, or chills.  Your catheter gets pulled out. MAKE SURE YOU:   Understand these instructions.  Will watch your condition.  Will get help right away if you are not doing well or get worse. Document Released: 09/11/2005 Document Revised: 01/26/2014 Document Reviewed: 09/02/2012 [facility] Patient Information 2015 [place], [place]. This information is not intended to replace advice given to you by your health care provider. Make sure you discuss any questions you have with your health care provider.    CYSTOSCOPY HOME CARE INSTRUCTIONS  Activity: Rest for the remainder of the day.  Do not drive or operate equipment today.  You may resume  normal activities in one to two days as instructed by your physician.   Meals: Drink plenty of liquids and eat light foods such as gelatin or soup this evening.  You may return to a normal meal plan tomorrow.  Return to Work: You may return to work in one to two days or as instructed by your physician.  Special Instructions / Symptoms: Call your physician if any of these symptoms occur:   -persistent or heavy bleeding  -bleeding which continues after first few urination  -large blood clots that are difficult to pass  -urine stream diminishes or stops completely  -fever equal to or higher than 101 degrees Farenheit.  -cloudy urine with a strong,  foul odor  -severe pain  Females should always wipe from front to back after elimination.  You may feel some burning pain when you urinate.  This should disappear with time.  Applying moist heat to the lower abdomen or a hot tub bath may help relieve the pain.   Follow-Up / Date of Return Visit to Your Physician: Call for an appointment to arrange follow-up.     Post Anesthesia Home Care Instructions  Activity: Get plenty of rest for the remainder of the day. A responsible adult should stay with you for 24 hours following the procedure.  For the next 24 hours, DO NOT: -Drive a car -Paediatric nurse -Drink alcoholic beverages -Take any medication unless instructed by your physician -Make any legal decisions or sign important papers.  Meals: Start with liquid foods such as gelatin or soup. Progress to regular foods as tolerated. Avoid greasy, spicy, heavy foods. If nausea and/or vomiting occur, drink only clear liquids until the nausea and/or vomiting subsides. Call your physician if vomiting continues.  Special Instructions/Symptoms: Your throat may feel dry or sore from the anesthesia or the breathing tube placed in your throat during surgery. If this causes discomfort, gargle with warm salt water. The discomfort should disappear  within 24 hours.  If you had a scopolamine patch placed behind your ear for the management of post- operative nausea and/or vomiting:  1. The medication in the patch is effective for 72 hours, after which it should be removed.  Wrap patch in a tissue and discard in the trash. Wash hands thoroughly with soap and water. 2. You may remove the patch earlier than 72 hours if you experience unpleasant side effects which may include dry mouth, dizziness or visual disturbances. 3. Avoid touching the patch. Wash your hands with soap and water after contact with the patch.

## 2015-04-29 NOTE — Anesthesia Postprocedure Evaluation (Signed)
  Anesthesia Post-op Note  Patient: Anna Ramsey  Procedure(s) Performed: Procedure(s) (LRB): CYSTOSCOPY WITH BIOPSY WITH FULGERATION WITH GYRUS  (N/A)  Patient Location: PACU  Anesthesia Type: General  Level of Consciousness: awake and alert   Airway and Oxygen Therapy: Patient Spontanous Breathing  Post-op Pain: mild  Post-op Assessment: Post-op Vital signs reviewed, Patient's Cardiovascular Status Stable, Respiratory Function Stable, Patent Airway and No signs of Nausea or vomiting  Last Vitals:  Filed Vitals:   04/29/15 1245  BP: 119/72  Pulse: 67  Temp: 36.4 C  Resp: 14    Post-op Vital Signs: stable   Complications: No apparent anesthesia complications

## 2015-05-03 ENCOUNTER — Encounter (HOSPITAL_BASED_OUTPATIENT_CLINIC_OR_DEPARTMENT_OTHER): Payer: Self-pay | Admitting: Urology

## 2015-05-03 NOTE — H&P (Signed)
Urology Admission H&P  Chief Complaint: suprapubic pain  History of Present Illness: Anna Ramsey is a 49yo with a hx of cervical Ca s/p hysterectomy, and pelvic radiation who has suprapubic pain. On office cysto she was found to have a dome bladder mass  Past Medical History  Diagnosis Date  . Hypertension   . Seasonal allergies   . History of radiation therapy 10/20/13-11/28/13    pelvis 50.4 gray  . History of cervical cancer     12/ 2014  Stage IB2--  s/p  TAH w/ BSO and pelvic lymphadectomy/  and radiation therapy  . Mild intermittent asthma   . Lesion of bladder   . Erythematous bladder mucosa   . Heart murmur     asymptomatic per pt --  no echo  . Dysuria   . Hematuria    Past Surgical History  Procedure Laterality Date  . Foot surgery Left 2010  . Radical abdominal hysterectomy  09-05-2013    w/ BILATERAL SALPINGOOPHORECTOMY AND PELVIC LYMPHADECTOMY  . Cystoscopy with biopsy N/A 04/29/2015    Procedure: CYSTOSCOPY WITH BIOPSY WITH FULGERATION;  Surgeon: [name], MD;  Location: [facility];  Service: Urology;  Laterality: N/A;  1 HR [phone] [id number]    Home Medications:  No prescriptions prior to admission   Allergies:  Allergies  Allergen Reactions  . Penicillins Other (See Comments)    Unknown childhood reaction  . Sulfa Antibiotics Other (See Comments)    Numbness in lower extremities from knees down    Family History  Problem Relation Age of Onset  . Heart attack Father 56  . Diabetes Father   . Hypertension Maternal Grandmother   . Stroke Maternal Grandmother   . Cancer Mother     breast/thyroid   Social History:  reports that she quit smoking about 2 years ago. Her smoking use included E-cigarettes and Cigarettes. She has a 12.5 pack-year smoking history. She has never used smokeless tobacco. She reports that she drinks about 0.5 oz of alcohol per week. She reports that she does not use illicit drugs.  Review of  Systems  Genitourinary: Positive for dysuria.  All other systems reviewed and are negative.   Physical Exam:  Vital signs in last 24 hours:   Physical Exam  Constitutional: She is oriented to person, place, and time. She appears well-developed and well-nourished.  HENT:  Head: Normocephalic and atraumatic.  Eyes: EOM are normal. Pupils are equal, round, and reactive to light.  Neck: Normal range of motion. Neck supple.  Cardiovascular: Normal rate and regular rhythm.   Respiratory: Effort normal and breath sounds normal.  GI: Soft. Bowel sounds are normal.  Musculoskeletal: Normal range of motion.  Neurological: She is alert and oriented to person, place, and time.  Skin: Skin is warm and dry.  Psychiatric: She has a normal mood and affect. Her behavior is normal. Judgment and thought content normal.    Laboratory Data:  No results found for this or any previous visit (from the past 24 hour(s)). No results found for this or any previous visit (from the past 240 hour(s)). Creatinine: No results for input(s): CREATININE in the last 168 hours.   Impression/Assessment:  Bladder lesion  Plan:  Risks/benefits/alterantives to bladder biopsy was explained to the patient and she understands and wishes to proceed with surgery  MCKENZIE, PATRICK L 05/03/2015, 9:11 AM

## 2015-05-17 ENCOUNTER — Other Ambulatory Visit: Payer: Self-pay | Admitting: Family Medicine

## 2015-05-19 ENCOUNTER — Other Ambulatory Visit: Payer: Self-pay | Admitting: Family Medicine

## 2015-06-11 ENCOUNTER — Other Ambulatory Visit: Payer: Self-pay | Admitting: Family Medicine

## 2015-08-13 ENCOUNTER — Encounter: Payer: Self-pay | Admitting: Gynecology

## 2015-08-13 ENCOUNTER — Ambulatory Visit: Payer: PRIVATE HEALTH INSURANCE | Attending: Gynecology | Admitting: Gynecology

## 2015-08-13 ENCOUNTER — Other Ambulatory Visit (HOSPITAL_COMMUNITY)
Admission: RE | Admit: 2015-08-13 | Discharge: 2015-08-13 | Disposition: A | Discharge status: Home / Self Care | Payer: PRIVATE HEALTH INSURANCE | Source: Ambulatory Visit | Attending: Gynecology | Admitting: Gynecology

## 2015-08-13 VITALS — BP 132/88 | HR 80 | Temp 98.3°F | Resp 18 | Ht 61.0 in | Wt 131.7 lb

## 2015-08-13 DIAGNOSIS — R3989 Other symptoms and signs involving the genitourinary system: Secondary | ICD-10-CM

## 2015-08-13 DIAGNOSIS — N3040 Irradiation cystitis without hematuria: Secondary | ICD-10-CM | POA: Diagnosis not present

## 2015-08-13 DIAGNOSIS — Z01411 Encounter for gynecological examination (general) (routine) with abnormal findings: Secondary | ICD-10-CM | POA: Insufficient documentation

## 2015-08-13 DIAGNOSIS — C539 Malignant neoplasm of cervix uteri, unspecified: Secondary | ICD-10-CM | POA: Insufficient documentation

## 2015-08-13 DIAGNOSIS — Z8541 Personal history of malignant neoplasm of cervix uteri: Secondary | ICD-10-CM

## 2015-08-13 MED ORDER — OXYCODONE-ACETAMINOPHEN 5-325 MG PO TABS: 1.0000 | ORAL_TABLET | ORAL | Status: DC | PRN

## 2015-08-13 NOTE — Addendum Note (Signed)
Addended by: Joylene John D on: 08/13/2015 01:25 PM   Modules accepted: Orders

## 2015-08-13 NOTE — Progress Notes (Signed)
Consult Note: Gyn-Onc   Anna Ramsey 49 y.o. female  Chief Complaint  Patient presents with  . 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 Clinically free of disease. 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 She uses Percocet 2-3 times a week in the evening for persistent bladder discomfort.  Plan:    Pap smear is obtained .  [name] returned to see me in 6 months.  Signs and symptoms of recurrent disease were discussed.  Interval history:  The patient returns today as previously scheduled . Since her last visit with me she developed suprapubic discomfort. This is evaluated for an MRI showing no evidence of recurrent disease. Subsequently it was felt she had a bladder lesion and underwent cystoscopy with biopsies. Pathology revealed no significant abnormalities. The patient was placed on Vesicare which relieves her bladder symptoms through the day although in the evening outpatient home from work she does have symptoms periodically. 2 or 3 times a week she uses Percocet to ease the discomfort. Otherwise she's done well. She denies any pelvic pain pressure vaginal bleeding or discharge. Functional status is excellent.    HPI:  50yo WMF seen in consultation at the request of Dr. [name] regarding management of a newly diagnosed adenocarcinoma of the cervix.   The patient has a remote history of CIN treated with cryotherapy as well as a LEEP procedure.  .  Since March 2014, she has had a clear vaginal discharge that has become more profuse.    In June she had an AGUS pap smear.  Colposcopy in July was negative as was an ECC (path reviewed) An ultrasound in November 2014 shows a thickening.  A repeat ECC produced abundant tissue which was an adenocarcinoma.  A CT scan shows no adenopathy but an enlarged cervix mass measuring 5.1x4.4x5.2  cm.  There was also a simple 5 cm ovarian cyst. She underwent a radical hysterectomy, bilateral salpingo-oophorectomy, and pelvic lymphadenectomy on 09/05/2013. Final pathology showed a endocervical adenocarcinoma measuring 5 cm in diameter invading 1.85 cm of a total wall thickness of 2.0 cm. Lymph vascular space invasion was also noted. Surgical margins were clear and 29 lymph nodes were negative. The patient had an uncomplicated postoperative course.  Given the intermediate risk factors postoperative pelvic radiation was recommended. Whole pelvis radiation therapy was completed on 11/28/2013.  Review of Systems:10 point review of systems is negative except as noted in interval history.   Vitals: Blood pressure 132/88, pulse 80, temperature 98.3 F (36.8 C), temperature source Oral, resp. rate 18, height 5\' 1"  (1.549 m), weight 131 lb 11.2 oz (59.739 kg), last menstrual period 08/19/2013, SpO2 100 %.  Physical Exam: General : The patient is a healthy woman in no acute distress.  HEENT: normocephalic, extraoccular movements normal; neck is supple without thyromegally  Lynphnodes: Supraclavicular and inguinal nodes not enlarged  Abdomen: Soft, non-tender, no ascites, no organomegally, no masses, no hernias Pfannenstiel incision and suprapubic incisions are healed Pelvic:  EGBUS: Normal female  Vagina: Normal, no lesions  Urethra and Bladder: Normal, non-tender  Cervix: Surgically absent Uterus:  Surgically absent Bi-manual examination: Non-tender; no adenxal masses or nodularity  Rectal: normal sphincter tone, no masses, no blood  Lower extremities: Normal without any varicosities or edema     Allergies  Allergen Reactions  . Penicillins Other (See Comments)    Unknown childhood reaction  . Sulfa  Antibiotics Other (See Comments)    Numbness in lower extremities from knees down    Past Medical History  Diagnosis Date  . Hypertension   . Seasonal allergies   . History of  radiation therapy 10/20/13-11/28/13    pelvis 50.4 gray  . History of cervical cancer     12/ 2014  Stage IB2--  s/p  TAH w/ BSO and pelvic lymphadectomy/  and radiation therapy  . Mild intermittent asthma   . Lesion of bladder   . Erythematous bladder mucosa   . Heart murmur     asymptomatic per pt --  no echo  . Dysuria   . Hematuria     Past Surgical History  Procedure Laterality Date  . Foot surgery Left 2010  . Radical abdominal hysterectomy  09-05-2013    w/ BILATERAL SALPINGOOPHORECTOMY AND PELVIC LYMPHADECTOMY  . Cystoscopy with biopsy N/A 04/29/2015    Procedure: CYSTOSCOPY WITH BIOPSY WITH FULGERATION;  Surgeon: [name], MD;  Location: [facility];  Service: Urology;  Laterality: N/A;  1 HR [phone] [id number]    Current Outpatient Prescriptions  Medication Sig Dispense Refill  . albuterol (VENTOLIN HFA) 108 (90 BASE) MCG/ACT inhaler INHALE TWO TO FOUR PUFFS BY MOUTH EVERY 6 HOURS AS NEEDED 18 g 1  . cetirizine (ZYRTEC) 10 MG tablet Take 10 mg by mouth at bedtime.      . diazepam (VALIUM) 5 MG tablet     . docusate sodium (COLACE) 100 MG capsule Take 200 mg by mouth 2 (two) times daily as needed for mild constipation.    . fluticasone (FLONASE) 50 MCG/ACT nasal spray Place 2 sprays into both nostrils daily as needed.     . metoprolol (LOPRESSOR) 100 MG tablet TAKE ONE TABLET BY MOUTH TWICE DAILY 60 tablet 3  . oxyCODONE-acetaminophen (PERCOCET/ROXICET) 5-325 MG per tablet Take 1-2 tablets by mouth every 4 (four) hours as needed for severe pain. 30 tablet 0  . PARoxetine (PAXIL) 20 MG tablet TAKE ONE TABLET BY MOUTH IN THE MORNING 90 tablet 0  . traZODone (DESYREL) 50 MG tablet TAKE ONE TABLET BY MOUTH AT BEDTIME 90 tablet 0  . VESICARE 5 MG tablet     . [DISCONTINUED] prochlorperazine (COMPAZINE) 10 MG tablet Take 10 mg by mouth every 6 (six) hours as needed for nausea or vomiting. Called in to [facility] per Dr. Isidore Moos.  Disp. 30  tablets. 1 refill.     No current facility-administered medications for this visit.    Social History   Social History  . Marital Status: Married    Spouse Name: N/A  . Number of Children: 1  . Years of Education: N/A   Occupational History  .     Social History Main Topics  . Smoking status: Former Smoker -- 0.50 packs/day for 25 years    Types: E-cigarettes, Cigarettes    Quit date: 09/25/2012  . Smokeless tobacco: Never Used     Comment: has been using e-cig for past 2 yrs  . Alcohol Use: 0.5 oz/week    1 Standard drinks or equivalent per week     Comment: occasional  . Drug Use: No  . Sexual Activity: Not on file   Other Topics Concern  . Not on file   Social History Narrative    Family History  Problem Relation Age of Onset  . Heart attack Father 45  . Diabetes Father   . Hypertension Maternal Grandmother   . Stroke Maternal  Grandmother   . Cancer Mother     breast/thyroid      Alvino Chapel, MD 08/13/2015, 10:28 AM

## 2015-08-13 NOTE — Patient Instructions (Signed)
Return to see us in 6 months. 

## 2015-08-16 ENCOUNTER — Other Ambulatory Visit: Payer: Self-pay | Admitting: Family Medicine

## 2015-08-17 LAB — CYTOLOGY - PAP

## 2015-08-24 ENCOUNTER — Telehealth: Payer: Self-pay | Admitting: Gynecologic Oncology

## 2015-08-24 NOTE — Telephone Encounter (Signed)
Called patient with pap smear results.  Advised that Dr. Fermin Schwab recommends a repeat pap in six months.  No concerns voiced.  Advised to call for any needs.

## 2015-08-30 ENCOUNTER — Ambulatory Visit (INDEPENDENT_AMBULATORY_CARE_PROVIDER_SITE_OTHER): Payer: PRIVATE HEALTH INSURANCE | Admitting: Family Medicine

## 2015-08-30 ENCOUNTER — Encounter: Payer: Self-pay | Admitting: Family Medicine

## 2015-08-30 VITALS — BP 130/82 | HR 58 | Temp 97.9°F | Resp 18 | Wt 131.5 lb

## 2015-08-30 DIAGNOSIS — I10 Essential (primary) hypertension: Secondary | ICD-10-CM

## 2015-08-30 DIAGNOSIS — G4700 Insomnia, unspecified: Secondary | ICD-10-CM | POA: Diagnosis not present

## 2015-08-30 DIAGNOSIS — J4520 Mild intermittent asthma, uncomplicated: Secondary | ICD-10-CM | POA: Diagnosis not present

## 2015-08-30 MED ORDER — ALBUTEROL SULFATE HFA 108 (90 BASE) MCG/ACT IN AERS: INHALATION_SPRAY | RESPIRATORY_TRACT | Status: DC

## 2015-08-30 MED ORDER — BUDESONIDE-FORMOTEROL FUMARATE 80-4.5 MCG/ACT IN AERO: 2.0000 | INHALATION_SPRAY | Freq: Two times a day (BID) | RESPIRATORY_TRACT | Status: DC

## 2015-08-30 NOTE — Progress Notes (Signed)
   Subjective:    Patient ID: Anna Ramsey, female    DOB: 1965-11-03, 49 y.o.   MRN: [id number]  HPI  Hypertension- Pt denies chest pain, SOB, dizziness, or heart palpitations.  Taking meds as directed w/o problems.  Denies medication side effects.    Insomnia -  Hasn't taken her trazodone in about 1-2 weeks.   She is now on Vesicare for her bladder spasms from her Urologist  She was also given valium to use PRN.    ASthma - she is doing well.  She is using her albuterol daily. No nightime sxs. Says the dampness and cool air has triggered her asthma. Would ike to restart her Symbicort.    Review of Systems     Objective:   Physical Exam  Constitutional: She is oriented to person, place, and time. She appears well-developed and well-nourished.  HENT:  Head: Normocephalic and atraumatic.  Cardiovascular: Normal rate, regular rhythm and normal heart sounds.   Pulmonary/Chest: Effort normal and breath sounds normal.  Neurological: She is alert and oriented to person, place, and time.  Skin: Skin is warm and dry.  Psychiatric: She has a normal mood and affect. Her behavior is normal.          Assessment & Plan:  Hypertension- Pt denies chest pain, SOB, dizziness, or heart palpitations.  Taking meds as directed w/o problems.  Denies medication side effects.     Insomnia - she wants to continue to hold off on medication for now. Since using valium PRN for pelvic pain.  Just call if needs to restart the trazodone.  Will leave on her med list.      Asthma, severe persistant - will restartSymbicort.  Refilled her albuterol  F/U in 6 months.

## 2015-10-06 ENCOUNTER — Other Ambulatory Visit: Payer: Self-pay | Admitting: Family Medicine

## 2015-11-16 ENCOUNTER — Other Ambulatory Visit: Payer: Self-pay | Admitting: Family Medicine

## 2016-01-24 ENCOUNTER — Other Ambulatory Visit: Payer: Self-pay | Admitting: Family Medicine

## 2016-02-02 ENCOUNTER — Other Ambulatory Visit: Payer: Self-pay | Admitting: Family Medicine

## 2016-02-04 ENCOUNTER — Ambulatory Visit: Payer: PRIVATE HEALTH INSURANCE | Admitting: Gynecology

## 2016-02-11 ENCOUNTER — Other Ambulatory Visit (HOSPITAL_COMMUNITY)
Admission: RE | Admit: 2016-02-11 | Discharge: 2016-02-11 | Disposition: A | Discharge status: Home / Self Care | Payer: PRIVATE HEALTH INSURANCE | Source: Ambulatory Visit | Attending: Gynecology | Admitting: Gynecology

## 2016-02-11 ENCOUNTER — Ambulatory Visit: Payer: PRIVATE HEALTH INSURANCE | Attending: Gynecology | Admitting: Gynecology

## 2016-02-11 ENCOUNTER — Other Ambulatory Visit: Payer: Self-pay

## 2016-02-11 VITALS — BP 126/88 | HR 85 | Temp 98.2°F | Resp 18 | Ht 61.0 in | Wt 128.0 lb

## 2016-02-11 DIAGNOSIS — Z8541 Personal history of malignant neoplasm of cervix uteri: Secondary | ICD-10-CM | POA: Diagnosis not present

## 2016-02-11 DIAGNOSIS — N890 Mild vaginal dysplasia: Secondary | ICD-10-CM | POA: Diagnosis not present

## 2016-02-11 DIAGNOSIS — N3040 Irradiation cystitis without hematuria: Secondary | ICD-10-CM | POA: Insufficient documentation

## 2016-02-11 DIAGNOSIS — C539 Malignant neoplasm of cervix uteri, unspecified: Secondary | ICD-10-CM | POA: Diagnosis present

## 2016-02-11 DIAGNOSIS — Z923 Personal history of irradiation: Secondary | ICD-10-CM | POA: Insufficient documentation

## 2016-02-11 DIAGNOSIS — Z90710 Acquired absence of both cervix and uterus: Secondary | ICD-10-CM

## 2016-02-11 DIAGNOSIS — N3080 Other cystitis without hematuria: Secondary | ICD-10-CM | POA: Diagnosis not present

## 2016-02-11 DIAGNOSIS — Z01411 Encounter for gynecological examination (general) (routine) with abnormal findings: Secondary | ICD-10-CM | POA: Diagnosis present

## 2016-02-11 DIAGNOSIS — Y842 Radiological procedure and radiotherapy as the cause of abnormal reaction of the patient, or of later complication, without mention of misadventure at the time of the procedure: Secondary | ICD-10-CM | POA: Insufficient documentation

## 2016-02-11 NOTE — Progress Notes (Signed)
Consult Note: Gyn-Onc   Anna Ramsey 50 y.o. female  Chief Complaint  Patient presents with  . Follow-up  . 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 Clinically free of disease. 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   Plan:    Pap smear is obtained .  [name] returned to see me in 6 months.  Signs and symptoms of recurrent disease were discussed.She will continue to have annual mammograms.  Interval history:  The patient returns today as previously scheduled . Overall she's doing well. She does continue to have symptoms associated bladder pain especially in the late evening. During the day she takes desiccator and has good relief. She'll be contacting her urologist to see whether we can adjust her medication doses. She has no GI symptoms.. She denies any pelvic pain pressure vaginal bleeding or discharge. Functional status is excellent.    HPI:  50yo WMF seen in consultation at the request of Dr. [name] regarding management of a newly diagnosed adenocarcinoma of the cervix.   The patient has a remote history of CIN treated with cryotherapy as well as a LEEP procedure.  .  Since March 2014, she has had a clear vaginal discharge that has become more profuse.    In June she had an AGUS pap smear.  Colposcopy in July was negative as was an ECC (path reviewed) An ultrasound in November 2014 shows a thickening.  A repeat ECC produced abundant tissue which was an adenocarcinoma.  A CT scan shows no adenopathy but an enlarged cervix mass measuring 5.1x4.4x5.2 cm.  There was also a simple 5 cm ovarian cyst. She underwent a radical hysterectomy, bilateral salpingo-oophorectomy, and pelvic lymphadenectomy on 09/05/2013. Final pathology showed a endocervical adenocarcinoma measuring 5 cm in diameter invading 1.85 cm of a total wall thickness of 2.0 cm.  Lymph vascular space invasion was also noted. Surgical margins were clear and 29 lymph nodes were negative. The patient had an uncomplicated postoperative course.  Given the intermediate risk factors postoperative pelvic radiation was recommended. Whole pelvis radiation therapy was completed on 11/28/2013.  Review of Systems:10 point review of systems is negative except as noted in interval history.   Vitals: Blood pressure 126/88, pulse 85, temperature 98.2 F (36.8 C), temperature source Oral, resp. rate 18, height 5\' 1"  (1.549 m), weight 128 lb (58.06 kg), last menstrual period 08/19/2013, SpO2 97 %.  Physical Exam: General : The patient is a healthy woman in no acute distress.  HEENT: normocephalic, extraoccular movements normal; neck is supple without thyromegally  Lynphnodes: Supraclavicular and inguinal nodes not enlarged  Abdomen: Soft, non-tender, no ascites, no organomegally, no masses, no hernias Pfannenstiel incision and suprapubic incisions are healed Pelvic:  EGBUS: Normal female  Vagina: Normal, no lesions  Urethra and Bladder: Normal, non-tender  Cervix: Surgically absent Uterus:  Surgically absent Bi-manual examination: Non-tender; no adenxal masses or nodularity  Rectal: normal sphincter tone, no masses, no blood  Lower extremities: Normal without any varicosities or edema     Allergies  Allergen Reactions  . Penicillins Other (See Comments)    Unknown childhood reaction  . Sulfa Antibiotics Other (See Comments)    Numbness in lower extremities from knees down    Past Medical History  Diagnosis Date  . Hypertension   . Seasonal allergies   . History of radiation therapy 10/20/13-11/28/13    pelvis 50.4 gray  . History of  cervical cancer     12/ 2014  Stage IB2--  s/p  TAH w/ BSO and pelvic lymphadectomy/  and radiation therapy  . Mild intermittent asthma   . Lesion of bladder   . Erythematous bladder mucosa   . Heart murmur     asymptomatic per pt --  no  echo  . Dysuria   . Hematuria     Past Surgical History  Procedure Laterality Date  . Foot surgery Left 2010  . Radical abdominal hysterectomy  09-05-2013    w/ BILATERAL SALPINGOOPHORECTOMY AND PELVIC LYMPHADECTOMY  . Cystoscopy with biopsy N/A 04/29/2015    Procedure: CYSTOSCOPY WITH BIOPSY WITH FULGERATION;  Surgeon: [name], MD;  Location: [facility];  Service: Urology;  Laterality: N/A;  1 HR [phone] [id number]    Current Outpatient Prescriptions  Medication Sig Dispense Refill  . budesonide-formoterol (SYMBICORT) 80-4.5 MCG/ACT inhaler Inhale 2 puffs into the lungs 2 (two) times daily. 1 Inhaler 3  . cetirizine (ZYRTEC) 10 MG tablet Take 10 mg by mouth at bedtime.      . docusate sodium (COLACE) 100 MG capsule Take 200 mg by mouth 2 (two) times daily as needed for mild constipation.    . fluticasone (FLONASE) 50 MCG/ACT nasal spray Place 2 sprays into both nostrils daily as needed.     . metoprolol (LOPRESSOR) 100 MG tablet TAKE ONE TABLET BY MOUTH TWICE DAILY 60 tablet 3  . PARoxetine (PAXIL) 20 MG tablet Take 1 tablet (20 mg total) by mouth daily. APPOINTMENT NEEDED FOR FURTHER REFILLS 30 tablet 0  . tolterodine (DETROL) 2 MG tablet Take 2 mg by mouth daily.     . traZODone (DESYREL) 50 MG tablet TAKE ONE TABLET BY MOUTH AT BEDTIME 90 tablet 0  . albuterol (PROVENTIL HFA;VENTOLIN HFA) 108 (90 Base) MCG/ACT inhaler Inhale into the lungs.    . diazepam (VALIUM) 5 MG tablet Reported on 02/11/2016    . [DISCONTINUED] prochlorperazine (COMPAZINE) 10 MG tablet Take 10 mg by mouth every 6 (six) hours as needed for nausea or vomiting. Called in to [facility] per Dr. Isidore Moos.  Disp. 30 tablets. 1 refill.     No current facility-administered medications for this visit.    Social History   Social History  . Marital Status: Married    Spouse Name: N/A  . Number of Children: 1  . Years of Education: N/A   Occupational History  .      Social History Main Topics  . Smoking status: Former Smoker -- 0.50 packs/day for 25 years    Types: E-cigarettes, Cigarettes    Quit date: 09/25/2012  . Smokeless tobacco: Never Used     Comment: has been using e-cig for past 2 yrs  . Alcohol Use: 0.5 oz/week    1 Standard drinks or equivalent per week     Comment: occasional  . Drug Use: No  . Sexual Activity: Not on file   Other Topics Concern  . Not on file   Social History Narrative    Family History  Problem Relation Age of Onset  . Heart attack Father 85  . Diabetes Father   . Hypertension Maternal Grandmother   . Stroke Maternal Grandmother   . Cancer Mother     breast/thyroid      Alvino Chapel, MD 02/11/2016, 12:08 PM

## 2016-02-11 NOTE — Patient Instructions (Signed)
We will call you with the results of your pap smear from today.  Plan to follow up in November or sooner if needed.  Please call for any questions or concerns.

## 2016-02-11 NOTE — Addendum Note (Signed)
Addended by: Elizebeth Koller on: 02/11/2016 03:34 PM   Modules accepted: Orders

## 2016-02-15 LAB — CYTOLOGY - PAP

## 2016-02-16 ENCOUNTER — Telehealth: Payer: Self-pay | Admitting: Gynecologic Oncology

## 2016-02-16 NOTE — Telephone Encounter (Signed)
Patient advised of pap smear results and Dr. Mora Bellman recommendations for repeat pap in six months.  No concerns voiced.  Advised to call for any needs.

## 2016-03-16 ENCOUNTER — Other Ambulatory Visit: Payer: Self-pay | Admitting: Family Medicine

## 2016-03-30 LAB — HM MAMMOGRAPHY

## 2016-04-05 ENCOUNTER — Encounter: Payer: Self-pay | Admitting: Family Medicine

## 2016-04-06 ENCOUNTER — Other Ambulatory Visit: Payer: Self-pay | Admitting: Family Medicine

## 2016-04-11 ENCOUNTER — Other Ambulatory Visit: Payer: Self-pay | Admitting: Family Medicine

## 2016-04-13 ENCOUNTER — Other Ambulatory Visit: Payer: Self-pay | Admitting: Family Medicine

## 2016-04-19 ENCOUNTER — Telehealth: Payer: Self-pay | Admitting: Family Medicine

## 2016-04-19 NOTE — Telephone Encounter (Signed)
I called pt to schedule her for a f/u on asthma

## 2016-05-11 ENCOUNTER — Encounter: Payer: Self-pay | Admitting: Family Medicine

## 2016-05-11 ENCOUNTER — Ambulatory Visit (INDEPENDENT_AMBULATORY_CARE_PROVIDER_SITE_OTHER): Payer: PRIVATE HEALTH INSURANCE | Admitting: Family Medicine

## 2016-05-11 ENCOUNTER — Other Ambulatory Visit: Payer: Self-pay | Admitting: Family Medicine

## 2016-05-11 VITALS — BP 132/83 | HR 56 | Wt 129.0 lb

## 2016-05-11 DIAGNOSIS — G5793 Unspecified mononeuropathy of bilateral lower limbs: Secondary | ICD-10-CM | POA: Diagnosis not present

## 2016-05-11 DIAGNOSIS — I10 Essential (primary) hypertension: Secondary | ICD-10-CM | POA: Diagnosis not present

## 2016-05-11 DIAGNOSIS — J4520 Mild intermittent asthma, uncomplicated: Secondary | ICD-10-CM | POA: Diagnosis not present

## 2016-05-11 MED ORDER — BUDESONIDE-FORMOTEROL FUMARATE 80-4.5 MCG/ACT IN AERO: 2.0000 | INHALATION_SPRAY | Freq: Two times a day (BID) | RESPIRATORY_TRACT | 6 refills | Status: DC

## 2016-05-11 MED ORDER — PAROXETINE HCL 20 MG PO TABS: 20.0000 mg | ORAL_TABLET | Freq: Every day | ORAL | 1 refills | Status: DC

## 2016-05-11 MED ORDER — METOPROLOL TARTRATE 100 MG PO TABS: 100.0000 mg | ORAL_TABLET | Freq: Two times a day (BID) | ORAL | 1 refills | Status: DC

## 2016-05-11 MED ORDER — TRAZODONE HCL 50 MG PO TABS: 50.0000 mg | ORAL_TABLET | Freq: Every day | ORAL | 1 refills | Status: DC

## 2016-05-11 NOTE — Progress Notes (Signed)
Subjective:    CC: Asthma/HTN  HPI:  Hypertension- Pt denies chest pain, SOB, dizziness, or heart palpitations.  Taking meds as directed w/o problems.  Denies medication side effects.    Asthma - Overall she is doing well. Really only using her Symbicort once a day most days. It seems to be controlling her symptoms and rarely has to use her albuterol.  She does complain of tingling and numbness in both of her feet going up the back of her legs bilaterally. She had mentioned it to her urologist. They felt like it could be related to her back and some pelvic problems. She has been going to pelvic PT which has helped her pelvic pain but has not helped with the neuropathy type symptoms. She has not had any back pain or recent injuries.  Past medical history, Surgical history, Family history not pertinant except as noted below, Social history, Allergies, and medications have been entered into the medical record, reviewed, and corrections made.   Review of Systems: No fevers, chills, night sweats, weight loss, chest pain, or shortness of breath.   Objective:    General: Well Developed, well nourished, and in no acute distress.  Neuro: Alert and oriented x3, extra-ocular muscles intact, sensation grossly intact.  HEENT: Normocephalic, atraumatic  Skin: Warm and dry, no rashes. Cardiac: Regular rate and rhythm, no murmurs rubs or gallops, no lower extremity edema.  Respiratory: Clear to auscultation bilaterally. Not using accessory muscles, speaking in full sentences.   Impression and Recommendations:   HTN- Well controlled. Continue current regimen. Follow up in  21mo.  Due for CMP and lipid. Next  Neuropathy-we'll evaluate for deficiency such as B12, folate, abnormal thyroid, iron deficiency, etc.  Asthma, mod persistant - controlled on Symbicort daily. She did just get a new cat so hopefully she will do well. We'll keep an eye on this.

## 2016-05-12 LAB — COMPLETE METABOLIC PANEL WITH GFR
ALT: 76 U/L — ABNORMAL HIGH (ref 6–29)
AST: 64 U/L — ABNORMAL HIGH (ref 10–35)
Albumin: 4.3 g/dL (ref 3.6–5.1)
Alkaline Phosphatase: 106 U/L (ref 33–115)
BUN: 13 mg/dL (ref 7–25)
CALCIUM: 9.1 mg/dL (ref 8.6–10.2)
CHLORIDE: 101 mmol/L (ref 98–110)
CO2: 27 mmol/L (ref 20–31)
CREATININE: 1.05 mg/dL (ref 0.50–1.10)
GFR, EST AFRICAN AMERICAN: 73 mL/min (ref >=60)
GFR, EST NON AFRICAN AMERICAN: 63 mL/min (ref >=60)
Glucose, Bld: 85 mg/dL (ref 65–99)
POTASSIUM: 4.5 mmol/L (ref 3.5–5.3)
Sodium: 137 mmol/L (ref 135–146)
Total Bilirubin: 0.5 mg/dL (ref 0.2–1.2)
Total Protein: 7 g/dL (ref 6.1–8.1)

## 2016-05-12 LAB — CBC
HEMATOCRIT: 44.6 % (ref 35.0–45.0)
Hemoglobin: 15 g/dL (ref 11.7–15.5)
MCH: 32.5 pg (ref 27.0–33.0)
MCHC: 33.6 g/dL (ref 32.0–36.0)
MCV: 96.5 fL (ref 80.0–100.0)
MPV: 10.3 fL (ref 7.5–12.5)
PLATELETS: 249 10*3/uL (ref 140–400)
RBC: 4.62 MIL/uL (ref 3.80–5.10)
RDW: 14.5 % (ref 11.0–15.0)
WBC: 3.5 10*3/uL — ABNORMAL LOW (ref 3.8–10.8)

## 2016-05-12 LAB — LIPID PANEL
CHOL/HDL RATIO: 4.2 ratio (ref <=5.0)
CHOLESTEROL: 251 mg/dL — AB (ref 125–200)
HDL: 60 mg/dL (ref >=46)
LDL CALC: 174 mg/dL — AB (ref <130)
TRIGLYCERIDES: 87 mg/dL (ref <150)
VLDL: 17 mg/dL (ref <30)

## 2016-05-12 LAB — MAGNESIUM: Magnesium: 2.1 mg/dL (ref 1.5–2.5)

## 2016-05-12 LAB — TSH: TSH: >150 mIU/L — ABNORMAL HIGH

## 2016-05-13 LAB — FOLATE: Folate: 6.2 ng/mL (ref >5.4)

## 2016-05-13 LAB — FERRITIN: Ferritin: 128 ng/mL (ref 10–232)

## 2016-05-13 LAB — VITAMIN B12: Vitamin B-12: 540 pg/mL (ref 200–1100)

## 2016-05-15 LAB — T4, FREE: FREE T4: 0.3 ng/dL — AB (ref 0.8–1.8)

## 2016-05-16 LAB — VITAMIN B1: VITAMIN B1 (THIAMINE): 8 nmol/L (ref 8–30)

## 2016-05-16 MED ORDER — LEVOTHYROXINE SODIUM 112 MCG PO TABS: 112.0000 ug | ORAL_TABLET | Freq: Every day | ORAL | 0 refills | Status: DC

## 2016-05-16 NOTE — Addendum Note (Signed)
Addended by: Beatrice Lecher D on: 05/16/2016 08:14 AM   Modules accepted: Orders

## 2016-05-17 ENCOUNTER — Encounter: Payer: Self-pay | Admitting: Family Medicine

## 2016-05-18 LAB — VITAMIN B6: VITAMIN B6: 29.2 ng/mL — AB (ref 2.1–21.7)

## 2016-05-19 ENCOUNTER — Encounter: Payer: Self-pay | Admitting: Family Medicine

## 2016-05-19 ENCOUNTER — Ambulatory Visit (INDEPENDENT_AMBULATORY_CARE_PROVIDER_SITE_OTHER): Payer: PRIVATE HEALTH INSURANCE | Admitting: Family Medicine

## 2016-05-19 VITALS — BP 132/83 | HR 61 | Temp 97.4°F | Resp 16 | Ht 61.0 in | Wt 125.0 lb

## 2016-05-19 DIAGNOSIS — R030 Elevated blood-pressure reading, without diagnosis of hypertension: Secondary | ICD-10-CM | POA: Diagnosis not present

## 2016-05-19 DIAGNOSIS — IMO0001 Reserved for inherently not codable concepts without codable children: Secondary | ICD-10-CM

## 2016-05-19 NOTE — Progress Notes (Signed)
Agree with above.  Catherine Metheney, MD  

## 2016-05-19 NOTE — Progress Notes (Signed)
Patient states felt unusual yesterday; had throbbing sense that blood pressure was elevated; no chest pain or shortness of breath. Has been told she can work from home whenever she desires; today she feels better although the neuropathy she is under treatment for remains.  Today's BP 132/83.  Advised patient she may return for BP check as needed.

## 2016-05-22 ENCOUNTER — Encounter: Payer: Self-pay | Admitting: Family Medicine

## 2016-05-23 ENCOUNTER — Other Ambulatory Visit: Payer: Self-pay | Admitting: Family Medicine

## 2016-05-23 MED ORDER — GABAPENTIN 100 MG PO CAPS: 100.0000 mg | ORAL_CAPSULE | Freq: Every day | ORAL | 1 refills | Status: DC

## 2016-06-02 MED ORDER — GABAPENTIN 300 MG PO CAPS: 300.0000 mg | ORAL_CAPSULE | Freq: Four times a day (QID) | ORAL | 2 refills | Status: DC

## 2016-06-04 ENCOUNTER — Other Ambulatory Visit: Payer: Self-pay | Admitting: Family Medicine

## 2016-06-04 DIAGNOSIS — G609 Hereditary and idiopathic neuropathy, unspecified: Secondary | ICD-10-CM

## 2016-06-23 ENCOUNTER — Telehealth: Payer: Self-pay | Admitting: *Deleted

## 2016-06-23 NOTE — Telephone Encounter (Signed)
eri 

## 2016-07-10 ENCOUNTER — Other Ambulatory Visit: Payer: Self-pay

## 2016-07-10 DIAGNOSIS — R7989 Other specified abnormal findings of blood chemistry: Secondary | ICD-10-CM

## 2016-07-11 ENCOUNTER — Other Ambulatory Visit: Payer: Self-pay | Admitting: *Deleted

## 2016-07-11 DIAGNOSIS — R7989 Other specified abnormal findings of blood chemistry: Secondary | ICD-10-CM

## 2016-07-11 LAB — T4, FREE: Free T4: 1.8 ng/dL (ref 0.8–1.8)

## 2016-07-11 LAB — TSH: TSH: 0.04 mIU/L — ABNORMAL LOW

## 2016-07-11 NOTE — Addendum Note (Signed)
Addended by: Teddy Spike on: 07/11/2016 06:19 PM   Modules accepted: Orders

## 2016-08-11 ENCOUNTER — Other Ambulatory Visit (HOSPITAL_COMMUNITY)
Admission: RE | Admit: 2016-08-11 | Discharge: 2016-08-11 | Disposition: A | Discharge status: Home / Self Care | Payer: PRIVATE HEALTH INSURANCE | Source: Ambulatory Visit | Attending: Gynecology | Admitting: Gynecology

## 2016-08-11 ENCOUNTER — Encounter: Payer: Self-pay | Admitting: Gynecology

## 2016-08-11 ENCOUNTER — Ambulatory Visit: Payer: PRIVATE HEALTH INSURANCE | Attending: Gynecology | Admitting: Gynecology

## 2016-08-11 VITALS — BP 118/73 | HR 59 | Temp 97.8°F | Resp 18 | Ht 61.0 in | Wt 122.6 lb

## 2016-08-11 DIAGNOSIS — Z87891 Personal history of nicotine dependence: Secondary | ICD-10-CM | POA: Insufficient documentation

## 2016-08-11 DIAGNOSIS — Z79899 Other long term (current) drug therapy: Secondary | ICD-10-CM | POA: Diagnosis not present

## 2016-08-11 DIAGNOSIS — Z808 Family history of malignant neoplasm of other organs or systems: Secondary | ICD-10-CM | POA: Insufficient documentation

## 2016-08-11 DIAGNOSIS — Z833 Family history of diabetes mellitus: Secondary | ICD-10-CM | POA: Diagnosis not present

## 2016-08-11 DIAGNOSIS — Z90710 Acquired absence of both cervix and uterus: Secondary | ICD-10-CM | POA: Diagnosis not present

## 2016-08-11 DIAGNOSIS — Z923 Personal history of irradiation: Secondary | ICD-10-CM | POA: Insufficient documentation

## 2016-08-11 DIAGNOSIS — Z90722 Acquired absence of ovaries, bilateral: Secondary | ICD-10-CM | POA: Diagnosis not present

## 2016-08-11 DIAGNOSIS — C539 Malignant neoplasm of cervix uteri, unspecified: Secondary | ICD-10-CM

## 2016-08-11 DIAGNOSIS — Z803 Family history of malignant neoplasm of breast: Secondary | ICD-10-CM | POA: Diagnosis not present

## 2016-08-11 DIAGNOSIS — E039 Hypothyroidism, unspecified: Secondary | ICD-10-CM | POA: Diagnosis not present

## 2016-08-11 DIAGNOSIS — Z1151 Encounter for screening for human papillomavirus (HPV): Secondary | ICD-10-CM | POA: Insufficient documentation

## 2016-08-11 DIAGNOSIS — Z8249 Family history of ischemic heart disease and other diseases of the circulatory system: Secondary | ICD-10-CM | POA: Insufficient documentation

## 2016-08-11 DIAGNOSIS — Z08 Encounter for follow-up examination after completed treatment for malignant neoplasm: Secondary | ICD-10-CM | POA: Diagnosis present

## 2016-08-11 DIAGNOSIS — G629 Polyneuropathy, unspecified: Secondary | ICD-10-CM

## 2016-08-11 DIAGNOSIS — Z01411 Encounter for gynecological examination (general) (routine) with abnormal findings: Secondary | ICD-10-CM | POA: Insufficient documentation

## 2016-08-11 DIAGNOSIS — I10 Essential (primary) hypertension: Secondary | ICD-10-CM | POA: Insufficient documentation

## 2016-08-11 DIAGNOSIS — Z8541 Personal history of malignant neoplasm of cervix uteri: Secondary | ICD-10-CM | POA: Insufficient documentation

## 2016-08-11 DIAGNOSIS — Z882 Allergy status to sulfonamides status: Secondary | ICD-10-CM | POA: Insufficient documentation

## 2016-08-11 DIAGNOSIS — Z880 Allergy status to penicillin: Secondary | ICD-10-CM | POA: Diagnosis not present

## 2016-08-11 DIAGNOSIS — Z7951 Long term (current) use of inhaled steroids: Secondary | ICD-10-CM | POA: Diagnosis not present

## 2016-08-11 NOTE — Addendum Note (Signed)
Addended by: Joylene John D on: 08/11/2016 11:04 AM   Modules accepted: Orders

## 2016-08-11 NOTE — Patient Instructions (Signed)
We will call you with the results of your pap smear from today.  Plan to follow up in six months or sooner if needed. 

## 2016-08-11 NOTE — Progress Notes (Signed)
Consult Note: Gyn-Onc   Anna Ramsey 50 y.o. female  Chief Complaint  Patient presents with  . Malignant neoplasm of cervix, unspecified site [name] )    follow up    Assessment :Stage IB2 adenocarcinoma of the cervix status post radical abdominal hysterectomy, BSO, pelvic lymphadenectomy (December 2014). Clinically free of disease.  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  Plan:    Pap smear is obtained .  [name] returned to see me in 6 months.  Signs and symptoms of recurrent disease were discussed.She will continue to have annual mammograms.  Interval history:  The patient returns today as previously scheduled . Overall she's doing well. Her bladder function is much better after undergoing pelvic floor physical therapy. She's had some difficulties this past 6 months with peripheral neuropathy in her legs. It appears that she was apparently hypothyroid and is now on replacement therapy.  . She has no GI, GU or pelvic symptoms.. She denies any pelvic pain pressure vaginal bleeding or discharge. Functional status is excellent.    HPI:  50yo WMF seen in consultation at the request of Dr. [name] regarding management of a newly diagnosed adenocarcinoma of the cervix.   The patient has a remote history of CIN treated with cryotherapy as well as a LEEP procedure.  .  Since March 2014, she has had a clear vaginal discharge that has become more profuse.    In June she had an AGUS pap smear.  Colposcopy in July was negative as was an ECC (path reviewed) An ultrasound in November 2014 shows a thickening.  A repeat ECC produced abundant tissue which was an adenocarcinoma.  A CT scan shows no adenopathy but an enlarged cervix mass measuring 5.1x4.4x5.2 cm.  There was also a simple 5 cm ovarian cyst. She underwent a radical hysterectomy, bilateral salpingo-oophorectomy, and pelvic lymphadenectomy on 09/05/2013. Final pathology showed a endocervical adenocarcinoma measuring  5 cm in diameter invading 1.85 cm of a total wall thickness of 2.0 cm. Lymph vascular space invasion was also noted. Surgical margins were clear and 29 lymph nodes were negative. The patient had an uncomplicated postoperative course.  Given the intermediate risk factors postoperative pelvic radiation was recommended. Whole pelvis radiation therapy was completed on 11/28/2013.  Review of Systems:10 point review of systems is negative except as noted in interval history.   Vitals: Blood pressure 118/73, pulse (!) 59, temperature 97.8 F (36.6 C), temperature source Oral, resp. rate 18, height 5\' 1"  (1.549 m), weight 122 lb 9.6 oz (55.6 kg), last menstrual period 08/19/2013, SpO2 100 %.  Physical Exam: General : The patient is a healthy woman in no acute distress.  HEENT: normocephalic, extraoccular movements normal; neck is supple without thyromegally  Lynphnodes: Supraclavicular and inguinal nodes not enlarged  Abdomen: Soft, non-tender, no ascites, no organomegally, no masses, no hernias Pfannenstiel incision and suprapubic incisions are healed Pelvic:  EGBUS: Normal female  Vagina: Normal, no lesions  Urethra and Bladder: Normal, non-tender  Cervix: Surgically absent Uterus:  Surgically absent Bi-manual examination: Non-tender; no adenxal masses or nodularity  Rectal: normal sphincter tone, no masses, no blood  Lower extremities: Normal without any varicosities or edema     Allergies  Allergen Reactions  . Penicillins Other (See Comments)    Unknown childhood reaction  . Sulfa Antibiotics Other (See Comments)    Numbness in lower extremities from knees down    Past Medical History:  Diagnosis Date  . Dysuria   . Erythematous  bladder mucosa   . Heart murmur    asymptomatic per pt --  no echo  . Hematuria   . History of cervical cancer    12/ 2014  Stage IB2--  s/p  TAH w/ BSO and pelvic lymphadectomy/  and radiation therapy  . History of radiation therapy 10/20/13-11/28/13    pelvis 50.4 gray  . Hypertension   . Lesion of bladder   . Mild intermittent asthma   . Neuromuscular disorder ([place])   . Seasonal allergies   . Thyroid disease     Past Surgical History:  Procedure Laterality Date  . CYSTOSCOPY WITH BIOPSY N/A 04/29/2015   Procedure: CYSTOSCOPY WITH BIOPSY WITH FULGERATION;  Surgeon: [name], MD;  Location: [facility];  Service: Urology;  Laterality: N/A;  1 HR [phone] [id number]  . FOOT SURGERY Left 2010  . RADICAL ABDOMINAL HYSTERECTOMY  09-05-2013   w/ BILATERAL SALPINGOOPHORECTOMY AND PELVIC LYMPHADECTOMY    Current Outpatient Prescriptions  Medication Sig Dispense Refill  . budesonide-formoterol (SYMBICORT) 80-4.5 MCG/ACT inhaler Inhale 2 puffs into the lungs 2 (two) times daily. 10.2 g 6  . cetirizine (ZYRTEC) 10 MG tablet Take 10 mg by mouth at bedtime.      . docusate sodium (COLACE) 100 MG capsule Take 200 mg by mouth 2 (two) times daily as needed for mild constipation.    . fluticasone (FLONASE) 50 MCG/ACT nasal spray Place 2 sprays into both nostrils daily as needed.     . gabapentin (NEURONTIN) 300 MG capsule Take 1 capsule (300 mg total) by mouth 4 (four) times daily. 120 capsule 2  . levothyroxine (SYNTHROID, LEVOTHROID) 112 MCG tablet Take 1 tablet (112 mcg total) by mouth daily. 90 tablet 0  . metoprolol (LOPRESSOR) 100 MG tablet Take 1 tablet (100 mg total) by mouth 2 (two) times daily. 180 tablet 1  . PARoxetine (PAXIL) 20 MG tablet Take 1 tablet (20 mg total) by mouth daily. 90 tablet 1  . tolterodine (DETROL) 2 MG tablet Take 2 mg by mouth daily.     . traZODone (DESYREL) 50 MG tablet Take 1 tablet (50 mg total) by mouth at bedtime. 90 tablet 1  . albuterol (PROVENTIL HFA;VENTOLIN HFA) 108 (90 Base) MCG/ACT inhaler Inhale into the lungs.     No current facility-administered medications for this visit.     Social History   Social History  . Marital status: Married    Spouse name: N/A  .  Number of children: 1  . Years of education: N/A   Occupational History  .  [employer]   Social History Main Topics  . Smoking status: Former Smoker    Packs/day: 0.50    Years: 25.00    Types: E-cigarettes, Cigarettes    Quit date: 09/25/2012  . Smokeless tobacco: Never Used     Comment: has been using e-cig for past 2 yrs  . Alcohol use 0.5 oz/week    1 Standard drinks or equivalent per week     Comment: occasional  . Drug use: No  . Sexual activity: Not on file   Other Topics Concern  . Not on file   Social History Narrative  . No narrative on file    Family History  Problem Relation Age of Onset  . Heart attack Father 54  . Diabetes Father   . Hypertension Maternal Grandmother   . Stroke Maternal Grandmother   . Cancer Mother     breast/thyroid      Marti Sleigh,  MD 08/11/2016, 10:47 AM

## 2016-08-13 ENCOUNTER — Other Ambulatory Visit: Payer: Self-pay | Admitting: Family Medicine

## 2016-08-21 LAB — CYTOLOGY - PAP
DIAGNOSIS: UNDETERMINED — AB
HPV (WINDOPATH): DETECTED — AB

## 2016-08-22 ENCOUNTER — Telehealth: Payer: Self-pay | Admitting: Gynecologic Oncology

## 2016-08-22 IMAGING — MR MR ABDOMEN WO/W CM
18 of 27 series · 21 of 48 positions shown · IV contrast (Yes)
Comparison: CT abdomen/ pelvis 03/23/2015

CLINICAL DATA: Pelvic pain for 2 years, history of cervical cancer
post radiation. Total hysterectomy [id number]. Evaluate for urachal
abnormality.

EXAM:
MRI ABDOMEN AND PELVIS WITHOUT AND WITH CONTRAST
TECHNIQUE: Multiplanar multisequence MR imaging of the abdomen and pelvis was
performed both before and after the administration of intravenous
contrast.
CONTRAST:  12mL MULTIHANCE GADOBENATE DIMEGLUMINE 529 MG/ML IV SOLN

[Series 6: T2 · coronal · 6.0mm · 0.78mm/px · 1 of 32 slices shown (1 of 5)]
[im 1/32]
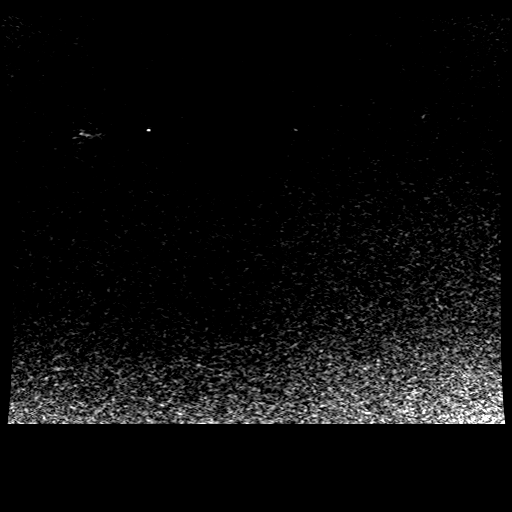

[Series 8: T2 · sagittal · 5.0mm · 0.47mm/px · 1 of 30 slices shown (2 of 5)]
[im 1/30]
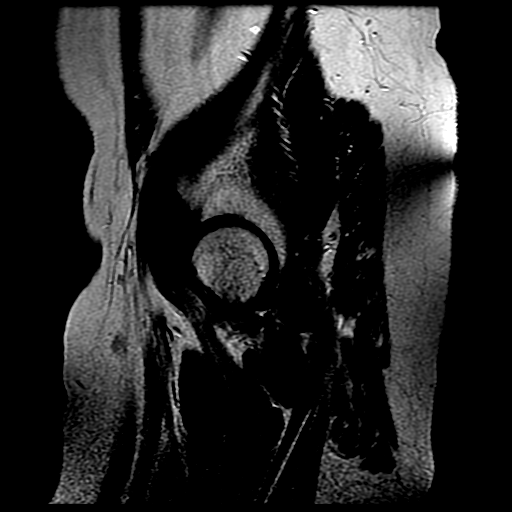

[Series 10: T2 fat-sat · axial · 5.0mm · 0.47mm/px · 1 of 38 slices shown (1 of 2)]
[im 1/38]
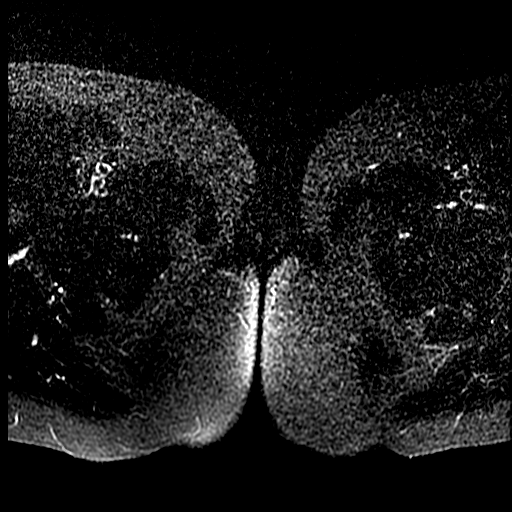

[Series 11: T2 · axial · 5.0mm · 0.47mm/px · 1 of 38 slices shown (3 of 5)]
[im 1/38]
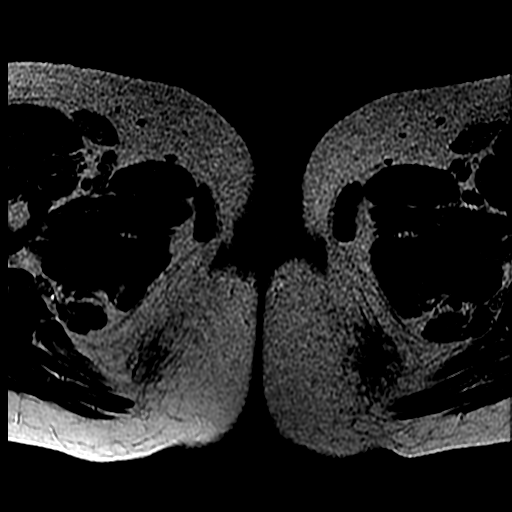

[Series 12: T1 · axial · 5.0mm · 0.47mm/px · 1 of 38 slices shown]
[im 1/38]
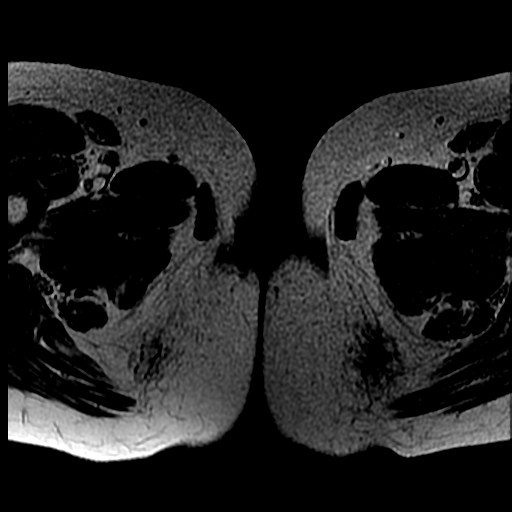

[Series 13: T1 fat-sat · axial · 5.0mm · 0.47mm/px · 1 of 38 slices shown (1 of 2)]
[im 1/38]
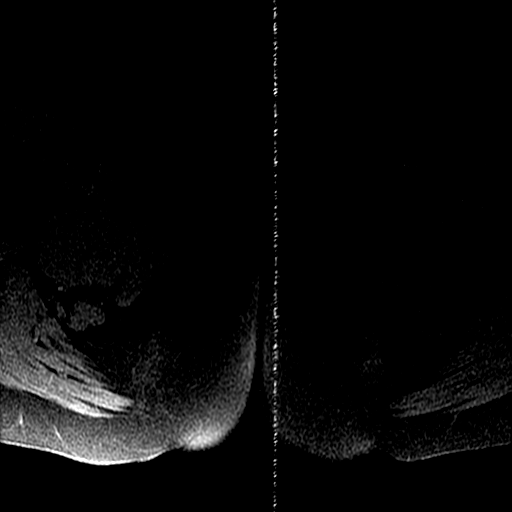

[Series 14: T1 fat-sat · axial · 5.0mm · 0.47mm/px · 1 of 38 slices shown (2 of 2)]
[im 1/38]
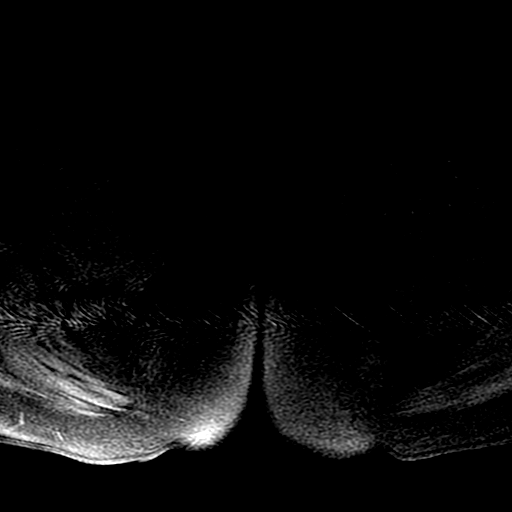

[Series 17: T2 fat-sat · axial · 5.0mm · 0.78mm/px · 1 of 45 slices shown (2 of 2)]
[im 1/45]
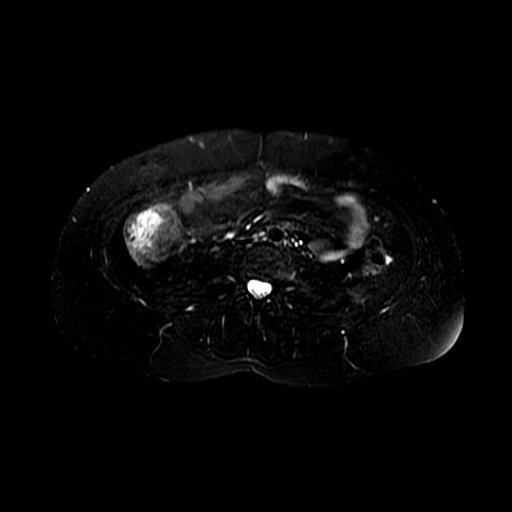

[Series 18: DWI b500 · axial · 5.0mm · 1.56mm/px · z∈[+225,+445]mm · 2 of 90 slices shown]
[im 1/90]
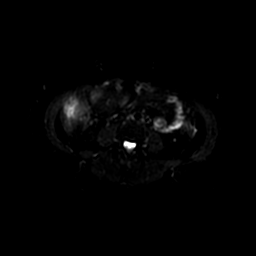
[im 90/90]
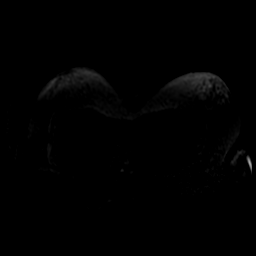

[Series 21: ax dualecho · axial · 5.0mm · 0.78mm/px · z∈[+211,+431]mm · 2 of 90 slices shown]
[im 1/90]
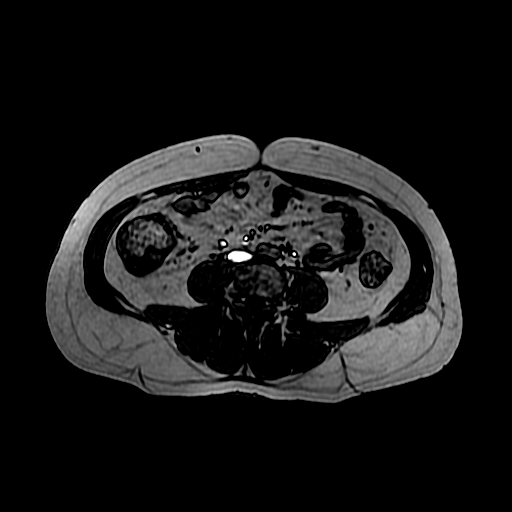
[im 90/90]
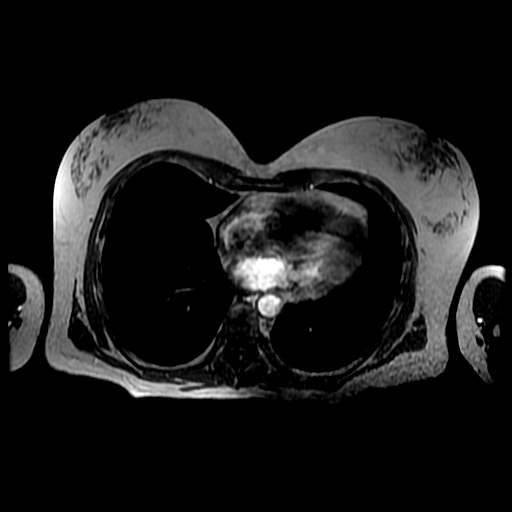

[Series 22: T2 · axial · 5.0mm · 0.78mm/px · 1 of 45 slices shown (4 of 5)]
[im 1/45]
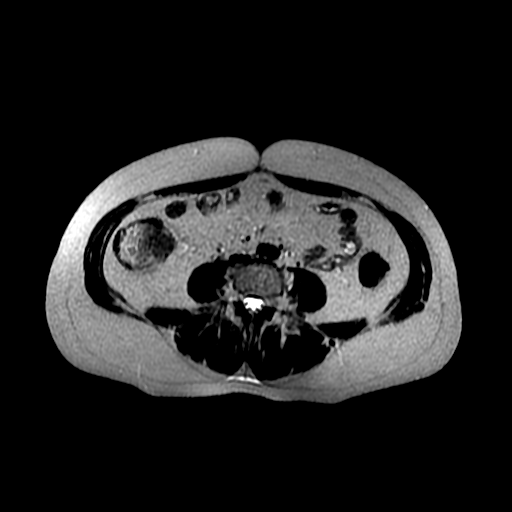

[Series 23: T2 · coronal · 5.0mm · 0.78mm/px · 1 of 35 slices shown (5 of 5)]
[im 1/35]
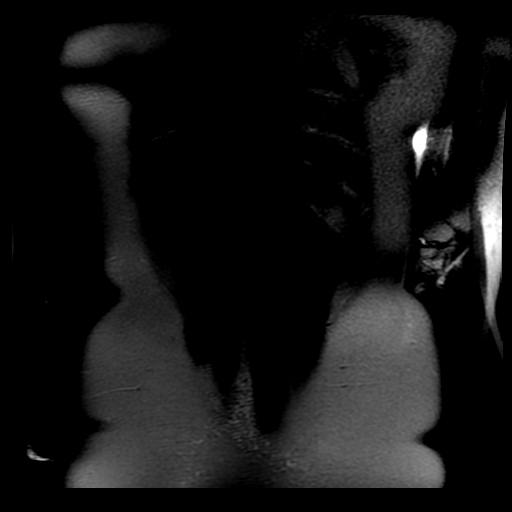

[Series 24: bSSFP · axial · 5.0mm · 0.78mm/px · 1 of 45 slices shown]
[im 1/45]
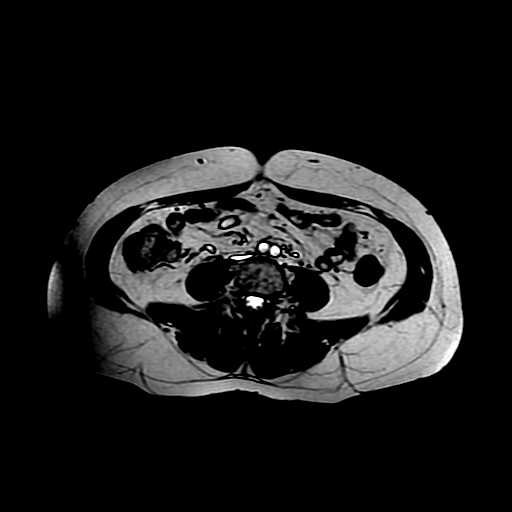

[Series 27: T1 fat-sat post-contrast · axial · 5.0mm · 0.47mm/px · 1 of 38 slices shown (1 of 2)]
[im 1/38]
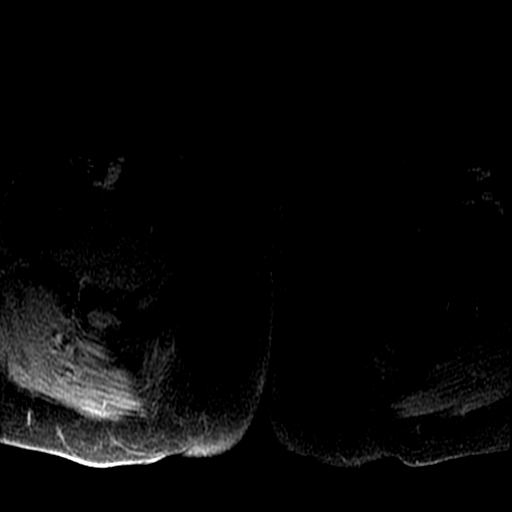

[Series 28: T1 fat-sat post-contrast · coronal · 5.0mm · 0.51mm/px · 1 of 26 slices shown (2 of 2)]
[im 1/26]
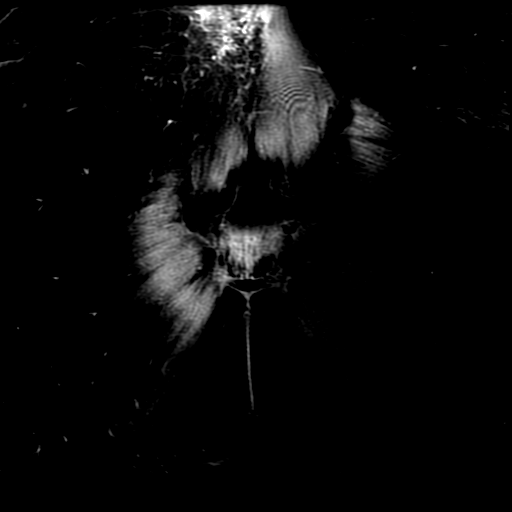

[Series 1800: DWI · axial · 5.0mm · 1.56mm/px · 1 of 45 slices shown]
[im 1/45]
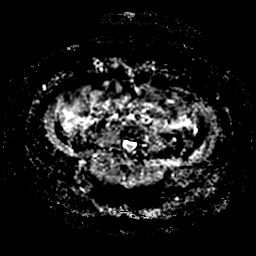

[Series 2500: T1 dynamic · axial · 5.0mm · 0.78mm/px · z∈[+206,+434]mm · 2 of 92 slices shown (1 of 2)]
[im 1/92]
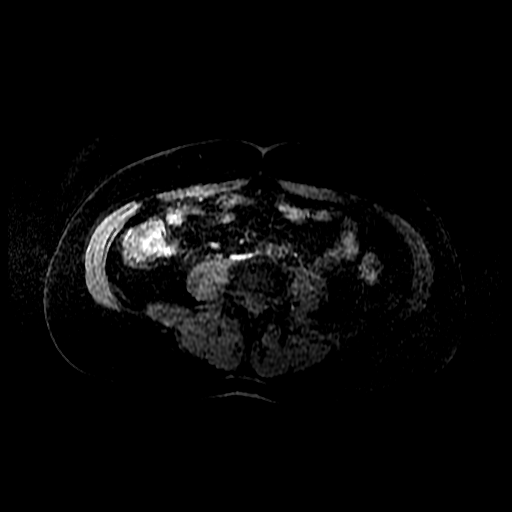
[im 92/92]
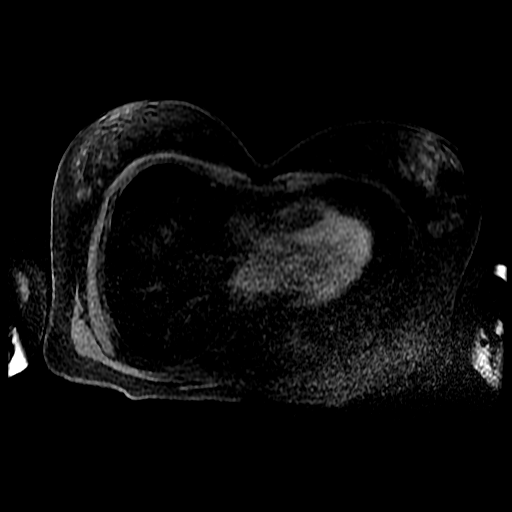

[Series 2501: T1 dynamic · axial · 5.0mm · 0.78mm/px · 1 of 92 slices shown (2 of 2)]
[im 1/92]
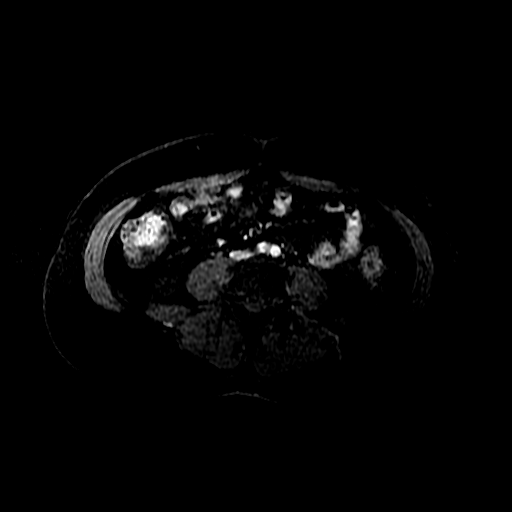

[21 of 48 positions shown; findings below may reference images not displayed]

FINDINGS: MRI ABDOMEN FINDINGS

Lower chest:  Grossly normal.  No pleural effusion.

Hepatobiliary: Liver and gallbladder appear normal

Pancreas: Normal

Spleen: Normal

Adrenals/Urinary Tract: Adrenal glands and kidneys appear normal. No
hydroureteronephrosis.

Stomach/Bowel: Normal allowing for MRI technique

Vascular/Lymphatic: No lymphadenopathy.  No aortic aneurysm.

Other: No ascites

Musculoskeletal: Normal

MRI PELVIS FINDINGS

Trace pelvic free fluid is identified.

Uterus and ovaries are surgically absent.

Signal void artifact compatible with postsurgical changes noted at
the level of the vaginal cuff, bilateral adnexae, and anterior
abdominal wall. There is also signal void artifact compatible with
scarring from surgical incisional change at the anterior bladder
dome with subjectively stable upward peaking of the bladder dome
which may indicate adhesion. There is no T2 hyperintense mass or
other abnormality to suggest urachal abnormality.

No abnormal enhancement.

Tarlov cyst incidentally noted.  Mild concentric L5 disc bulge.
IMPRESSION: No intra-abdominal pathology or specific evidence for metastatic
disease in the setting of cervical cancer.

Postsurgical change from presumed anterior abdominal wall incision
with upward peaking of the anterior bladder dome likely indicating
adhesion but no MRI evidence for urachal abnormality.

## 2016-08-22 NOTE — Telephone Encounter (Signed)
Patient informed of pap smear results with Dr. Mora Bellman recommendations for repeat pap in 6 months.  No concerns voiced.  Advised to call for any needs or concerns.

## 2016-10-03 ENCOUNTER — Telehealth: Payer: Self-pay | Admitting: *Deleted

## 2016-10-03 NOTE — Telephone Encounter (Signed)
MD out of office 5/4 notified pt appt r./s for 5/18 1030am. Pt verbalized understanding. No further concerns

## 2016-11-13 ENCOUNTER — Ambulatory Visit (INDEPENDENT_AMBULATORY_CARE_PROVIDER_SITE_OTHER): Payer: PRIVATE HEALTH INSURANCE | Admitting: Family Medicine

## 2016-11-13 ENCOUNTER — Encounter: Payer: Self-pay | Admitting: Family Medicine

## 2016-11-13 VITALS — BP 115/85 | HR 62 | Wt 125.0 lb

## 2016-11-13 DIAGNOSIS — E039 Hypothyroidism, unspecified: Secondary | ICD-10-CM | POA: Insufficient documentation

## 2016-11-13 DIAGNOSIS — E038 Other specified hypothyroidism: Secondary | ICD-10-CM | POA: Diagnosis not present

## 2016-11-13 DIAGNOSIS — I10 Essential (primary) hypertension: Secondary | ICD-10-CM | POA: Diagnosis not present

## 2016-11-13 DIAGNOSIS — J4520 Mild intermittent asthma, uncomplicated: Secondary | ICD-10-CM

## 2016-11-13 DIAGNOSIS — G5793 Unspecified mononeuropathy of bilateral lower limbs: Secondary | ICD-10-CM | POA: Diagnosis not present

## 2016-11-13 NOTE — Progress Notes (Signed)
Subjective:    CC: BP, Asthma   HPI: Hypertension- Pt denies chest pain, SOB, dizziness, or heart palpitations.  Taking meds as directed w/o problems.  Denies medication side effects.    Asthma - No recent flares or exacerbations. In fact she has not even had to use her allergy medicine or her rescue inhaler in quite some time. She does typically struggle little bit more in the spring.  Hypo-thyroidism-no recent changes but she would like to have it rechecked in about 5 months. With her neuropathy in her legs she's always just worried that it could be related to her thyroid being off. Last TSH was actually little too suppressed.  She's also made some major changes to her diet. She cut back on protein and dairy. She is mostly eating a vegetable based diet at this point.  She says she actually feels much better and her stomach and BMs have been more regular.   Past medical history, Surgical history, Family history not pertinant except as noted below, Social history, Allergies, and medications have been entered into the medical record, reviewed, and corrections made.   Review of Systems: No fevers, chills, night sweats, weight loss, chest pain, or shortness of breath.   Objective:    General: Well Developed, well nourished, and in no acute distress.  Neuro: Alert and oriented x3, extra-ocular muscles intact, sensation grossly intact.  HEENT: Normocephalic, atraumatic  Skin: Warm and dry, no rashes. Cardiac: Regular rate and rhythm, no murmurs rubs or gallops, no lower extremity edema.  Respiratory: Clear to auscultation bilaterally. Not using accessory muscles, speaking in full sentences.   Impression and Recommendations:     HTN - Normally well controlled but she forgot to take her medication today. Continue current regimen. Follow-up in 6 months. We'll do for CMP and lipid panel at that time.  ASthma - mild, intermittent-well-controlled. Continue current regimen.  Hypothyroidism-due  to recheck TSH.  Periph neuropathy - will check her TSH and b12.

## 2016-11-14 LAB — VITAMIN B12: VITAMIN B 12: 716 pg/mL (ref 200–1100)

## 2016-11-14 LAB — TSH: TSH: 0.03 mIU/L — ABNORMAL LOW

## 2016-11-14 NOTE — Addendum Note (Signed)
Addended by: Teddy Spike on: 11/14/2016 05:43 PM   Modules accepted: Orders

## 2016-11-26 ENCOUNTER — Other Ambulatory Visit: Payer: Self-pay | Admitting: Family Medicine

## 2016-12-28 ENCOUNTER — Other Ambulatory Visit: Payer: Self-pay | Admitting: Family Medicine

## 2017-01-26 ENCOUNTER — Ambulatory Visit: Payer: PRIVATE HEALTH INSURANCE | Admitting: Gynecology

## 2017-02-09 ENCOUNTER — Encounter: Payer: Self-pay | Admitting: Gynecology

## 2017-02-09 ENCOUNTER — Other Ambulatory Visit (HOSPITAL_COMMUNITY)
Admission: RE | Admit: 2017-02-09 | Discharge: 2017-02-09 | Disposition: A | Discharge status: Home / Self Care | Payer: PRIVATE HEALTH INSURANCE | Source: Ambulatory Visit | Attending: Gynecology | Admitting: Gynecology

## 2017-02-09 ENCOUNTER — Ambulatory Visit: Payer: PRIVATE HEALTH INSURANCE | Attending: Gynecology | Admitting: Gynecology

## 2017-02-09 VITALS — BP 139/2 | HR 60 | Temp 97.8°F | Resp 8 | Wt 127.3 lb

## 2017-02-09 DIAGNOSIS — Z90722 Acquired absence of ovaries, bilateral: Secondary | ICD-10-CM

## 2017-02-09 DIAGNOSIS — Z90710 Acquired absence of both cervix and uterus: Secondary | ICD-10-CM | POA: Diagnosis not present

## 2017-02-09 DIAGNOSIS — Z8541 Personal history of malignant neoplasm of cervix uteri: Secondary | ICD-10-CM | POA: Insufficient documentation

## 2017-02-09 DIAGNOSIS — G629 Polyneuropathy, unspecified: Secondary | ICD-10-CM | POA: Diagnosis not present

## 2017-02-09 DIAGNOSIS — C539 Malignant neoplasm of cervix uteri, unspecified: Secondary | ICD-10-CM

## 2017-02-09 NOTE — Patient Instructions (Signed)
We will call you with the results of your pap smear from today.  Plan to follow up in six months or sooner if needed.

## 2017-02-09 NOTE — Progress Notes (Signed)
Consult Note: Gyn-Onc   Anna Ramsey 51 y.o. female  Chief Complaint  Patient presents with  . 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 Clinically free of disease.  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  Plan:    Pap smear is obtained .  [name] returned to see me in 6 months.  Signs and symptoms of recurrent disease were discussed.She will continue to have annual mammograms.  Interval history:  The patient returns today as previously scheduled . Overall she's doing well. Her bladder function is normal. She's currently seeing a neurologist regarding her neuropathy which apparently is in both her arms and her legs. She is currently taking gabapentin. From a gynecologic point of view she has no complaints. She specifically denies any pelvic pain pressure vaginal bleeding or discharge. She has no other GI or GU symptoms. Partial status is excellent.    HPI:  51yo WMF seen in consultation at the request of Dr. [name] regarding management of a newly diagnosed adenocarcinoma of the cervix.   The patient has a remote history of CIN treated with cryotherapy as well as a LEEP procedure.  .  Since March 2014, she has had a clear vaginal discharge that has become more profuse.    In June she had an AGUS pap smear.  Colposcopy in July was negative as was an ECC (path reviewed) An ultrasound in November 2014 shows a thickening.  A repeat ECC produced abundant tissue which was an adenocarcinoma.  A CT scan shows no adenopathy but an enlarged cervix mass measuring 5.1x4.4x5.2 cm.  There was also a simple 5 cm ovarian cyst. She underwent a radical hysterectomy, bilateral salpingo-oophorectomy, and pelvic lymphadenectomy on 09/05/2013. Final pathology showed a endocervical adenocarcinoma measuring 5  cm in diameter invading 1.85 cm of a total wall thickness of 2.0 cm. Lymph vascular space invasion was also noted. Surgical margins were clear and 29 lymph nodes were negative. The patient had an uncomplicated postoperative course.  Given the intermediate risk factors postoperative pelvic radiation was recommended. Whole pelvis radiation therapy was completed on 11/28/2013.  Review of Systems:10 point review of systems is negative except as noted in interval history.   Vitals: Blood pressure (!) 139/2, pulse 60, temperature 97.8 F (36.6 C), temperature source Oral, resp. rate (!) 8, weight 127 lb 4.8 oz (57.7 kg), last menstrual period 08/19/2013.  Physical Exam: General : The patient is a healthy woman in no acute distress.  HEENT: normocephalic, extraoccular movements normal; neck is supple without thyromegally  Lynphnodes: Supraclavicular and inguinal nodes not enlarged  Abdomen: Soft, non-tender, no ascites, no organomegally, no masses, no hernias Pfannenstiel incision and suprapubic incisions are healed Pelvic:  EGBUS: Normal female  Vagina: Normal, no lesions  Urethra and Bladder: Normal, non-tender  Cervix: Surgically absent Uterus:  Surgically absent Bi-manual examination: Non-tender; no adenxal masses or nodularity  Rectal: normal sphincter tone, no masses, no blood  Lower extremities: Normal without any varicosities or edema     Allergies  Allergen Reactions  . Penicillins Other (See Comments)    Unknown childhood reaction  . Sulfa Antibiotics Other (See Comments)    Numbness in lower extremities from knees down    Past Medical History:  Diagnosis Date  . Dysuria   . Erythematous bladder mucosa   . Heart murmur    asymptomatic per  pt --  no echo  . Hematuria   . History of cervical cancer    12/ 2014  Stage IB2--  s/p  TAH w/ BSO and pelvic lymphadectomy/  and radiation therapy  . History of radiation therapy 10/20/13-11/28/13   pelvis 50.4 gray  . Hypertension    . Lesion of bladder   . Mild intermittent asthma   . Neuromuscular disorder ([place])   . Seasonal allergies   . Thyroid disease     Past Surgical History:  Procedure Laterality Date  . CYSTOSCOPY WITH BIOPSY N/A 04/29/2015   Procedure: CYSTOSCOPY WITH BIOPSY WITH FULGERATION;  Surgeon: [name], MD;  Location: [facility];  Service: Urology;  Laterality: N/A;  1 HR [phone] [id number]  . FOOT SURGERY Left 2010  . RADICAL ABDOMINAL HYSTERECTOMY  09-05-2013   w/ BILATERAL SALPINGOOPHORECTOMY AND PELVIC LYMPHADECTOMY    Current Outpatient Prescriptions  Medication Sig Dispense Refill  . albuterol (PROVENTIL HFA;VENTOLIN HFA) 108 (90 Base) MCG/ACT inhaler Inhale into the lungs.    . budesonide-formoterol (SYMBICORT) 80-4.5 MCG/ACT inhaler Inhale 2 puffs into the lungs 2 (two) times daily. 10.2 g 6  . cetirizine (ZYRTEC) 10 MG tablet Take 10 mg by mouth at bedtime.      . docusate sodium (COLACE) 100 MG capsule Take 200 mg by mouth 2 (two) times daily as needed for mild constipation.    . fluticasone (FLONASE) 50 MCG/ACT nasal spray Place 2 sprays into both nostrils daily as needed.     . gabapentin (NEURONTIN) 300 MG capsule Take 1 capsule (300 mg total) by mouth 4 (four) times daily. 120 capsule 2  . levothyroxine (SYNTHROID, LEVOTHROID) 112 MCG tablet TAKE ONE TABLET BY MOUTH ONCE DAILY 90 tablet 1  . metoprolol (LOPRESSOR) 100 MG tablet TAKE ONE TABLET BY MOUTH TWICE DAILY 180 tablet 1  . PARoxetine (PAXIL) 20 MG tablet TAKE ONE TABLET BY MOUTH ONCE DAILY 90 tablet 1  . tolterodine (DETROL) 2 MG tablet Take 2 mg by mouth daily.     . traZODone (DESYREL) 50 MG tablet TAKE ONE TABLET BY MOUTH AT BEDTIME 90 tablet 0   No current facility-administered medications for this visit.     Social History   Social History  . Marital status: Married    Spouse name: N/A  . Number of children: 1  . Years of education: N/A   Occupational History  .  [employer]    Social History Main Topics  . Smoking status: Former Smoker    Packs/day: 0.50    Years: 25.00    Types: E-cigarettes, Cigarettes    Quit date: 09/25/2012  . Smokeless tobacco: Never Used     Comment: has been using e-cig for past 2 yrs  . Alcohol use 0.5 oz/week    1 Standard drinks or equivalent per week     Comment: occasional  . Drug use: No  . Sexual activity: Not on file   Other Topics Concern  . Not on file   Social History Narrative  . No narrative on file    Family History  Problem Relation Age of Onset  . Heart attack Father 8  . Diabetes Father   . Hypertension Maternal Grandmother   . Stroke Maternal Grandmother   . Cancer Mother        breast/thyroid      Marti Sleigh, MD 02/09/2017, 11:09 AM

## 2017-02-14 LAB — CYTOLOGY - PAP: HPV: DETECTED — AB

## 2017-03-20 LAB — HM MAMMOGRAPHY

## 2017-03-23 LAB — TSH: TSH: 0.41 m[IU]/L

## 2017-03-30 ENCOUNTER — Other Ambulatory Visit: Payer: Self-pay | Admitting: Family Medicine

## 2017-04-05 ENCOUNTER — Encounter: Payer: Self-pay | Admitting: Family Medicine

## 2017-04-13 ENCOUNTER — Encounter: Payer: Self-pay | Admitting: Family Medicine

## 2017-05-14 ENCOUNTER — Ambulatory Visit: Payer: PRIVATE HEALTH INSURANCE | Admitting: Family Medicine

## 2017-05-21 ENCOUNTER — Ambulatory Visit: Payer: PRIVATE HEALTH INSURANCE | Admitting: Family Medicine

## 2017-05-21 DIAGNOSIS — Z0189 Encounter for other specified special examinations: Secondary | ICD-10-CM

## 2017-05-21 NOTE — Progress Notes (Deleted)
Subjective:    CC:   HPI:  Hypertension- Pt denies chest pain, SOB, dizziness, or heart palpitations.  Taking meds as directed w/o problems.  Denies medication side effects.    Asthma -   GAD -   Past medical history, Surgical history, Family history not pertinant except as noted below, Social history, Allergies, and medications have been entered into the medical record, reviewed, and corrections made.   Review of Systems: No fevers, chills, night sweats, weight loss, chest pain, or shortness of breath.   Objective:    General: Well Developed, well nourished, and in no acute distress.  Neuro: Alert and oriented x3, extra-ocular muscles intact, sensation grossly intact.  HEENT: Normocephalic, atraumatic  Skin: Warm and dry, no rashes. Cardiac: Regular rate and rhythm, no murmurs rubs or gallops, no lower extremity edema.  Respiratory: Clear to auscultation bilaterally. Not using accessory muscles, speaking in full sentences.   Impression and Recommendations:    HTN -   Asthma -   GAD -

## 2017-06-17 ENCOUNTER — Other Ambulatory Visit: Payer: Self-pay | Admitting: Urology

## 2017-06-17 ENCOUNTER — Other Ambulatory Visit: Payer: Self-pay | Admitting: Family Medicine

## 2017-06-18 ENCOUNTER — Telehealth: Payer: Self-pay

## 2017-06-18 NOTE — Telephone Encounter (Signed)
Pt called and stated that she is completely out of her Metoprolol, and Paxil.  She has made an appointment for October 4, please advise as to refilling.

## 2017-06-18 NOTE — Telephone Encounter (Signed)
Ok to fill for 30 days  

## 2017-06-19 ENCOUNTER — Other Ambulatory Visit: Payer: Self-pay | Admitting: Urology

## 2017-06-20 NOTE — Telephone Encounter (Signed)
Pt informed that refills were sent on 06/18/17.Anna Ramsey [facility]

## 2017-06-26 ENCOUNTER — Telehealth: Payer: Self-pay | Admitting: *Deleted

## 2017-06-26 NOTE — Telephone Encounter (Signed)
Attempted to return the patient's call with no answer. LMOM for the patient to call the office back.

## 2017-06-28 ENCOUNTER — Encounter: Payer: Self-pay | Admitting: Family Medicine

## 2017-06-28 ENCOUNTER — Ambulatory Visit (INDEPENDENT_AMBULATORY_CARE_PROVIDER_SITE_OTHER): Payer: PRIVATE HEALTH INSURANCE | Admitting: Family Medicine

## 2017-06-28 VITALS — BP 121/75 | HR 68 | Ht 61.0 in | Wt 137.0 lb

## 2017-06-28 DIAGNOSIS — I10 Essential (primary) hypertension: Secondary | ICD-10-CM

## 2017-06-28 DIAGNOSIS — E038 Other specified hypothyroidism: Secondary | ICD-10-CM

## 2017-06-28 DIAGNOSIS — J4520 Mild intermittent asthma, uncomplicated: Secondary | ICD-10-CM

## 2017-06-28 DIAGNOSIS — R635 Abnormal weight gain: Secondary | ICD-10-CM

## 2017-06-28 MED ORDER — GABAPENTIN 300 MG PO CAPS: 600.0000 mg | ORAL_CAPSULE | Freq: Three times a day (TID) | ORAL | 2 refills | Status: DC

## 2017-06-28 NOTE — Progress Notes (Signed)
Subjective:    CC: Asthma   HPI:  F/U Asthma - No recent flares or exacerbations.  Hypertension- Pt denies chest pain, SOB, dizziness, or heart palpitations.  Taking meds as directed w/o problems.  Denies medication side effects.    Hypothyroid  - Doing well. No recent skin or hari changes. She has gained a lot of weight his years but says her appetite has been up as well.  She takes half a tab on Tuesday, Wednesday and Thursday of her thyroid medication and whole tab the other 4 days of the week.  Peripheral neuropathy-she says it really does limit her exercise. When she walks more exercises it really flares or neuropathy symptoms. Right now she gets fairly good control overall with her gabapentin etc. milligrams 3 times a day. She is requesting a refill on that today.   Past medical history, Surgical history, Family history not pertinant except as noted below, Social history, Allergies, and medications have been entered into the medical record, reviewed, and corrections made.   Review of Systems: No fevers, chills, night sweats, weight loss, chest pain, or shortness of breath.   Objective:    General: Well Developed, well nourished, and in no acute distress.  Neuro: Alert and oriented x3, extra-ocular muscles intact, sensation grossly intact.  HEENT: Normocephalic, atraumatic  Skin: Warm and dry, no rashes. Cardiac: Regular rate and rhythm, no murmurs rubs or gallops, no lower extremity edema.  Respiratory: Clear to auscultation bilaterally. Not using accessory muscles, speaking in full sentences.   Impression and Recommendations:   HTN - Well controlled. Continue current regimen. Follow up in  6 months.   Asthma-well-controlled. Stable.   Peripheral neuropathy-we'll refill gabapentin today. Next  Hypothyroidism-due to recheck TSH since her last one was a little too suppressed at 0.4. Will call for results and adjust medication as needed.  Increased appetite and weight  gain-recheck thyroid as well as a morning cortisol level and hemoglobin A1c.

## 2017-06-29 LAB — COMPLETE METABOLIC PANEL WITH GFR
AG Ratio: 1.5 (calc) (ref 1.0–2.5)
ALKALINE PHOSPHATASE (APISO): 112 U/L (ref 33–115)
ALT: 21 U/L (ref 6–29)
AST: 20 U/L (ref 10–35)
Albumin: 4.1 g/dL (ref 3.6–5.1)
BILIRUBIN TOTAL: 0.3 mg/dL (ref 0.2–1.2)
BUN: 15 mg/dL (ref 7–25)
CHLORIDE: 107 mmol/L (ref 98–110)
CO2: 29 mmol/L (ref 20–32)
Calcium: 9.4 mg/dL (ref 8.6–10.2)
Creat: 0.73 mg/dL (ref 0.50–1.10)
GFR, EST AFRICAN AMERICAN: 113 mL/min/{1.73_m2} (ref >=60)
GFR, Est Non African American: 97 mL/min/{1.73_m2} (ref >=60)
GLUCOSE: 97 mg/dL (ref 65–99)
Globulin: 2.7 g/dL (calc) (ref 1.9–3.7)
Potassium: 4.5 mmol/L (ref 3.5–5.3)
Sodium: 142 mmol/L (ref 135–146)
TOTAL PROTEIN: 6.8 g/dL (ref 6.1–8.1)

## 2017-06-29 LAB — HEMOGLOBIN A1C
HEMOGLOBIN A1C: 5.4 %{Hb} (ref <5.7)
Mean Plasma Glucose: 108 (calc)
eAG (mmol/L): 6 (calc)

## 2017-06-29 LAB — LIPID PANEL W/REFLEX DIRECT LDL
CHOLESTEROL: 198 mg/dL (ref <200)
HDL: 60 mg/dL (ref >50)
LDL CHOLESTEROL (CALC): 117 mg/dL — AB
Non-HDL Cholesterol (Calc): 138 mg/dL (calc) — ABNORMAL HIGH (ref <130)
TRIGLYCERIDES: 104 mg/dL (ref <150)
Total CHOL/HDL Ratio: 3.3 (calc) (ref <5.0)

## 2017-06-29 LAB — TSH: TSH: 0.77 m[IU]/L

## 2017-06-29 LAB — CORTISOL: Cortisol, Plasma: 11.6 ug/dL

## 2017-07-02 ENCOUNTER — Telehealth: Payer: Self-pay | Admitting: *Deleted

## 2017-07-02 NOTE — Telephone Encounter (Signed)
Returned the patient's call, patient needs to rescheduled November 2nd appt. Appt moved to December 10th at 3:45pm.

## 2017-07-03 ENCOUNTER — Other Ambulatory Visit: Payer: Self-pay | Admitting: Family Medicine

## 2017-07-27 ENCOUNTER — Ambulatory Visit: Payer: PRIVATE HEALTH INSURANCE | Admitting: Gynecology

## 2017-08-29 ENCOUNTER — Telehealth: Payer: Self-pay | Admitting: *Deleted

## 2017-08-29 NOTE — Telephone Encounter (Signed)
Called and moved the patient's appt on December 10th from 3:45pm to 2:45pm.

## 2017-09-03 ENCOUNTER — Ambulatory Visit: Payer: PRIVATE HEALTH INSURANCE | Admitting: Gynecology

## 2017-09-07 ENCOUNTER — Ambulatory Visit: Payer: PRIVATE HEALTH INSURANCE | Admitting: Gynecology

## 2017-09-27 ENCOUNTER — Telehealth: Payer: Self-pay | Admitting: *Deleted

## 2017-09-27 NOTE — Telephone Encounter (Signed)
Called the patient to reschedule her appt for tomorrow. Patient stated that "I just called and left message to cancel appt. I have broken both my legs." Appt rescheduled for March.

## 2017-09-28 ENCOUNTER — Ambulatory Visit: Payer: PRIVATE HEALTH INSURANCE | Admitting: Gynecology

## 2017-10-17 ENCOUNTER — Other Ambulatory Visit: Payer: Self-pay | Admitting: *Deleted

## 2017-10-17 MED ORDER — TRAZODONE HCL 50 MG PO TABS: 50.0000 mg | ORAL_TABLET | Freq: Every day | ORAL | 0 refills | Status: DC

## 2017-12-17 ENCOUNTER — Inpatient Hospital Stay: Payer: PRIVATE HEALTH INSURANCE | Attending: Gynecology | Admitting: Gynecology

## 2017-12-17 ENCOUNTER — Other Ambulatory Visit (HOSPITAL_COMMUNITY)
Admission: RE | Admit: 2017-12-17 | Discharge: 2017-12-17 | Disposition: A | Discharge status: Home / Self Care | Payer: PRIVATE HEALTH INSURANCE | Source: Ambulatory Visit | Attending: Gynecology | Admitting: Gynecology

## 2017-12-17 ENCOUNTER — Encounter: Payer: Self-pay | Admitting: Gynecology

## 2017-12-17 VITALS — BP 133/82 | HR 66 | Temp 97.8°F | Resp 18 | Ht 61.0 in | Wt 138.0 lb

## 2017-12-17 DIAGNOSIS — Z90722 Acquired absence of ovaries, bilateral: Secondary | ICD-10-CM | POA: Diagnosis not present

## 2017-12-17 DIAGNOSIS — Z8541 Personal history of malignant neoplasm of cervix uteri: Secondary | ICD-10-CM | POA: Insufficient documentation

## 2017-12-17 DIAGNOSIS — Z923 Personal history of irradiation: Secondary | ICD-10-CM | POA: Insufficient documentation

## 2017-12-17 DIAGNOSIS — Z87891 Personal history of nicotine dependence: Secondary | ICD-10-CM | POA: Insufficient documentation

## 2017-12-17 DIAGNOSIS — G629 Polyneuropathy, unspecified: Secondary | ICD-10-CM | POA: Diagnosis not present

## 2017-12-17 DIAGNOSIS — Z08 Encounter for follow-up examination after completed treatment for malignant neoplasm: Secondary | ICD-10-CM | POA: Insufficient documentation

## 2017-12-17 DIAGNOSIS — N870 Mild cervical dysplasia: Secondary | ICD-10-CM | POA: Diagnosis not present

## 2017-12-17 DIAGNOSIS — Z90710 Acquired absence of both cervix and uterus: Secondary | ICD-10-CM | POA: Insufficient documentation

## 2017-12-17 DIAGNOSIS — C539 Malignant neoplasm of cervix uteri, unspecified: Secondary | ICD-10-CM

## 2017-12-17 NOTE — Progress Notes (Signed)
Consult Note: Gyn-Onc   Anna Ramsey 52 y.o. female  Chief Complaint  Patient presents with  . Malignant neoplasm of cervix, unspecified site [facility])    Assessment :Stage IB2 adenocarcinoma of the cervix status post radical abdominal hysterectomy, BSO, pelvic lymphadenectomy (December 2014).  Adjuvant pelvic radiation therapy due to intermediate risk factors.  Clinically free of disease.  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   Plan:    Pap smear is obtained .  [name] returned to see me in 6 months.  Signs and symptoms of recurrent disease were discussed.She will continue to have annual mammograms.  Interval history:  The patient returns today as previously scheduled .  Since her last visit she has had 2 falls secondary to her peripheral neuropathy in her legs resulting in a fractured left patella and a fractured right ankle.  Patient is currently using a cane.  Otherwise she's doing well. Her bladder function is normal.   From a gynecologic point of view she has no complaints. She specifically denies any pelvic pain pressure vaginal bleeding or discharge. She has no other GI or GU symptoms.    HPI:  52yo WMF seen in consultation at the request of Dr. [name] regarding management of a newly diagnosed adenocarcinoma of the cervix.   The patient has a remote history of CIN treated with cryotherapy as well as a LEEP procedure.  .  Since March 2014, she has had a clear vaginal discharge that has become more profuse.    In June she had an AGUS pap smear.  Colposcopy in July was negative as was an ECC (path reviewed) An ultrasound in November 2014 shows a thickening.  A repeat ECC produced abundant tissue which was an adenocarcinoma.  A CT scan shows no adenopathy but an enlarged cervix mass measuring 5.1x4.4x5.2 cm.  There was also a simple 5 cm ovarian cyst. She underwent a radical hysterectomy, bilateral salpingo-oophorectomy, and pelvic lymphadenectomy on  09/05/2013. Final pathology showed a endocervical adenocarcinoma measuring 5 cm in diameter invading 1.85 cm of a total wall thickness of 2.0 cm. Lymph vascular space invasion was also noted. Surgical margins were clear and 29 lymph nodes were negative. The patient had an uncomplicated postoperative course.  Given the intermediate risk factors postoperative pelvic radiation was recommended. Whole pelvis radiation therapy was completed on 11/28/2013.  Review of Systems:10 point review of systems is negative except as noted in interval history.   Vitals: Blood pressure 133/82, pulse 66, temperature 97.8 F (36.6 C), temperature source Oral, resp. rate 18, height 5\' 1"  (1.549 m), weight 138 lb (62.6 kg), last menstrual period 08/19/2013, SpO2 100 %.  Physical Exam: General : The patient is a healthy woman in no acute distress.  HEENT: normocephalic, extraoccular movements normal; neck is supple without thyromegally  Lynphnodes: Supraclavicular and inguinal nodes not enlarged  Abdomen: Soft, non-tender, no ascites, no organomegally, no masses, no hernias Pfannenstiel incision and suprapubic incisions are healed Pelvic:  EGBUS: Normal female  Vagina: Normal, no lesions  Urethra and Bladder: Normal, non-tender  Cervix: Surgically absent Uterus:  Surgically absent Bi-manual examination: Non-tender; no adenxal masses or nodularity  Rectal: normal sphincter tone, no masses, no blood  Lower extremities: Normal without any varicosities or edema     Allergies  Allergen Reactions  . Penicillins Other (See Comments)    Unknown childhood reaction  . Sulfa Antibiotics Other (See Comments)    Numbness in lower extremities from knees down    Past  Medical History:  Diagnosis Date  . Dysuria   . Erythematous bladder mucosa   . Heart murmur    asymptomatic per pt --  no echo  . Hematuria   . History of cervical cancer    12/ 2014  Stage IB2--  s/p  TAH w/ BSO and pelvic lymphadectomy/  and  radiation therapy  . History of radiation therapy 10/20/13-11/28/13   pelvis 50.4 gray  . Hypertension   . Lesion of bladder   . Mild intermittent asthma   . Neuromuscular disorder ([place])   . Seasonal allergies   . Thyroid disease     Past Surgical History:  Procedure Laterality Date  . CYSTOSCOPY WITH BIOPSY N/A 04/29/2015   Procedure: CYSTOSCOPY WITH BIOPSY WITH FULGERATION;  Surgeon: [name], MD;  Location: [facility];  Service: Urology;  Laterality: N/A;  1 HR [phone] [id number]  . FOOT SURGERY Left 2010  . RADICAL ABDOMINAL HYSTERECTOMY  09-05-2013   w/ BILATERAL SALPINGOOPHORECTOMY AND PELVIC LYMPHADECTOMY    Current Outpatient Medications  Medication Sig Dispense Refill  . albuterol (PROVENTIL HFA;VENTOLIN HFA) 108 (90 Base) MCG/ACT inhaler Inhale into the lungs.    . cetirizine (ZYRTEC) 10 MG tablet Take 10 mg by mouth at bedtime.      . fluticasone (FLONASE) 50 MCG/ACT nasal spray Place 2 sprays into both nostrils daily as needed.     . gabapentin (NEURONTIN) 300 MG capsule Take 2 capsules (600 mg total) by mouth 3 (three) times daily. 120 capsule 2  . levothyroxine (SYNTHROID, LEVOTHROID) 112 MCG tablet TAKE 1 TABLET BY MOUTH ONCE DAILY 90 tablet 1  . metoprolol tartrate (LOPRESSOR) 100 MG tablet TAKE 1 TABLET BY MOUTH TWICE DAILY 180 tablet 1  . PARoxetine (PAXIL) 20 MG tablet TAKE 1 TABLET BY MOUTH ONCE DAILY 90 tablet 1  . SYMBICORT 80-4.5 MCG/ACT inhaler INHALE TWO PUFFS BY MOUTH TWICE DAILY 11 Inhaler 6  . tolterodine (DETROL) 2 MG tablet Take 2 mg by mouth daily.     . traZODone (DESYREL) 50 MG tablet Take 1 tablet (50 mg total) by mouth at bedtime. 90 tablet 0   No current facility-administered medications for this visit.     Social History   Socioeconomic History  . Marital status: Married    Spouse name: Not on file  . Number of children: 1  . Years of education: Not on file  . Highest education level: Not on file   Occupational History    Employer: [employer]  Social Needs  . Financial resource strain: Not on file  . Food insecurity:    Worry: Not on file    Inability: Not on file  . Transportation needs:    Medical: Not on file    Non-medical: Not on file  Tobacco Use  . Smoking status: Former Smoker    Packs/day: 0.50    Years: 25.00    Pack years: 12.50    Types: E-cigarettes, Cigarettes    Last attempt to quit: 09/25/2012    Years since quitting: 5.2  . Smokeless tobacco: Never Used  . Tobacco comment: has been using e-cig for past 2 yrs  Substance and Sexual Activity  . Alcohol use: Yes    Alcohol/week: 0.5 oz    Types: 1 Standard drinks or equivalent per week    Comment: occasional  . Drug use: No  . Sexual activity: Not on file  Lifestyle  . Physical activity:    Days per week: Not on file  Minutes per session: Not on file  . Stress: Not on file  Relationships  . Social connections:    Talks on phone: Not on file    Gets together: Not on file    Attends religious service: Not on file    Active member of club or organization: Not on file    Attends meetings of clubs or organizations: Not on file    Relationship status: Not on file  . Intimate partner violence:    Fear of current or ex partner: Not on file    Emotionally abused: Not on file    Physically abused: Not on file    Forced sexual activity: Not on file  Other Topics Concern  . Not on file  Social History Narrative  . Not on file    Family History  Problem Relation Age of Onset  . Heart attack Father 46  . Diabetes Father   . Hypertension Maternal Grandmother   . Stroke Maternal Grandmother   . Cancer Mother        breast/thyroid      Marti Sleigh, MD 12/17/2017, 9:30 AM

## 2017-12-17 NOTE — Patient Instructions (Signed)
We will contact you with the results of your pap smear from today.  Follow up in six months or sooner if needed.  Please call closer to the date to schedule.  Please call in June or July to [phone] to schedule for Sept 2019.  Please call for any new concerns or symptoms.

## 2017-12-19 LAB — CYTOLOGY - PAP

## 2017-12-28 ENCOUNTER — Other Ambulatory Visit: Payer: Self-pay | Admitting: Family Medicine

## 2018-01-29 ENCOUNTER — Other Ambulatory Visit: Payer: Self-pay | Admitting: *Deleted

## 2018-01-29 MED ORDER — METOPROLOL TARTRATE 100 MG PO TABS: 100.0000 mg | ORAL_TABLET | Freq: Two times a day (BID) | ORAL | 0 refills | Status: DC

## 2018-01-29 MED ORDER — PAROXETINE HCL 20 MG PO TABS: 20.0000 mg | ORAL_TABLET | Freq: Every day | ORAL | 0 refills | Status: DC

## 2018-01-29 MED ORDER — TRAZODONE HCL 50 MG PO TABS: 50.0000 mg | ORAL_TABLET | Freq: Every day | ORAL | 0 refills | Status: DC

## 2018-01-30 ENCOUNTER — Other Ambulatory Visit: Payer: Self-pay

## 2018-01-30 MED ORDER — METOPROLOL TARTRATE 100 MG PO TABS: 100.0000 mg | ORAL_TABLET | Freq: Two times a day (BID) | ORAL | 0 refills | Status: DC

## 2018-01-30 NOTE — Telephone Encounter (Signed)
Pt completely out of Metoprolol. Needs RX sent to hold her over until appt on 02-15-18.   RX SENT, ADVISED PT SHE HAS TO KEEP HER UPCOMING APPT FOR ANY FURTHER REFILLS.

## 2018-02-15 ENCOUNTER — Encounter: Payer: Self-pay | Admitting: Family Medicine

## 2018-02-15 ENCOUNTER — Ambulatory Visit (INDEPENDENT_AMBULATORY_CARE_PROVIDER_SITE_OTHER): Payer: 59 | Admitting: Family Medicine

## 2018-02-15 VITALS — BP 139/89 | HR 91 | Ht 61.0 in | Wt 138.0 lb

## 2018-02-15 DIAGNOSIS — G5793 Unspecified mononeuropathy of bilateral lower limbs: Secondary | ICD-10-CM

## 2018-02-15 DIAGNOSIS — I10 Essential (primary) hypertension: Secondary | ICD-10-CM

## 2018-02-15 DIAGNOSIS — F411 Generalized anxiety disorder: Secondary | ICD-10-CM | POA: Diagnosis not present

## 2018-02-15 DIAGNOSIS — J4520 Mild intermittent asthma, uncomplicated: Secondary | ICD-10-CM | POA: Diagnosis not present

## 2018-02-15 MED ORDER — PAROXETINE HCL 30 MG PO TABS: 30.0000 mg | ORAL_TABLET | Freq: Every day | ORAL | 1 refills | Status: DC

## 2018-02-15 MED ORDER — FLUTICASONE PROPIONATE HFA 110 MCG/ACT IN AERO: 2.0000 | INHALATION_SPRAY | Freq: Two times a day (BID) | RESPIRATORY_TRACT | 5 refills | Status: DC

## 2018-02-15 MED ORDER — METOPROLOL TARTRATE 100 MG PO TABS: 100.0000 mg | ORAL_TABLET | Freq: Two times a day (BID) | ORAL | 1 refills | Status: DC

## 2018-02-15 NOTE — Progress Notes (Signed)
Subjective:    CC: BP and Asthma  HPI: Hypertension- Pt denies chest pain, SOB, dizziness, or heart palpitations.  Taking meds as directed w/o problems.  Denies medication side effects.    F/U Asthma  -only on Symbicort. + spring allergies been aggravating her symptoms.  She says she rarely uses her albuterol though..   F/U GAD - doing well overall. Happy with her current medication.   For her neuropathy she is currently on gabapentin.  She says it, makes her feel "jacked up".  She will likely be on it forever.  Fortunately her right leg gives out on her at times that she does use a cane.  Past medical history, Surgical history, Family history not pertinant except as noted below, Social history, Allergies, and medications have been entered into the medical record, reviewed, and corrections made.   Review of Systems: No fevers, chills, night sweats, weight loss, chest pain, or shortness of breath.   Objective:    General: Well Developed, well nourished, and in no acute distress.  Neuro: Alert and oriented x3, extra-ocular muscles intact, sensation grossly intact.  HEENT: Normocephalic, atraumatic  Skin: Warm and dry, no rashes. Cardiac: Regular rate and rhythm, no murmurs rubs or gallops, no lower extremity edema.  Respiratory: Clear to auscultation bilaterally. Not using accessory muscles, speaking in full sentences.   Impression and Recommendations:    HTN - Well controlled. Continue current regimen. Follow up in  20months.    Asthma -we discussed stepping down from Symbicort to a single agent.  We will send over prescription for Flovent.  She can always go back up to the Symbicort if she feels like she is using her rescue inhaler more frequently or feels like her symptoms are being exacerbated.  Continue albuterol as needed.  GAD - STable. Well controlled. F/U in 6 months.    Neuropathy -currently treated with gabapentin.

## 2018-02-16 LAB — BASIC METABOLIC PANEL WITH GFR
BUN: 13 mg/dL (ref 7–25)
CHLORIDE: 107 mmol/L (ref 98–110)
CO2: 24 mmol/L (ref 20–32)
Calcium: 9.3 mg/dL (ref 8.6–10.2)
Creat: 0.65 mg/dL (ref 0.50–1.10)
GFR, Est African American: 121 mL/min/{1.73_m2} (ref >=60)
GFR, Est Non African American: 104 mL/min/{1.73_m2} (ref >=60)
Glucose, Bld: 87 mg/dL (ref 65–99)
POTASSIUM: 4.1 mmol/L (ref 3.5–5.3)
SODIUM: 139 mmol/L (ref 135–146)

## 2018-02-16 LAB — TSH: TSH: 0.26 m[IU]/L — AB

## 2018-02-19 ENCOUNTER — Other Ambulatory Visit: Payer: Self-pay | Admitting: *Deleted

## 2018-02-19 DIAGNOSIS — E039 Hypothyroidism, unspecified: Secondary | ICD-10-CM

## 2018-03-01 ENCOUNTER — Other Ambulatory Visit: Payer: Self-pay | Admitting: Family Medicine

## 2018-04-17 ENCOUNTER — Other Ambulatory Visit: Payer: Self-pay | Admitting: Family Medicine

## 2018-04-17 DIAGNOSIS — I10 Essential (primary) hypertension: Secondary | ICD-10-CM

## 2018-04-17 DIAGNOSIS — E038 Other specified hypothyroidism: Secondary | ICD-10-CM

## 2018-04-17 DIAGNOSIS — R635 Abnormal weight gain: Secondary | ICD-10-CM

## 2018-04-23 LAB — TSH: TSH: 0.11 mIU/L — ABNORMAL LOW

## 2018-04-24 ENCOUNTER — Other Ambulatory Visit: Payer: Self-pay | Admitting: Family Medicine

## 2018-04-24 ENCOUNTER — Other Ambulatory Visit: Payer: Self-pay

## 2018-04-24 DIAGNOSIS — E039 Hypothyroidism, unspecified: Secondary | ICD-10-CM

## 2018-05-10 ENCOUNTER — Other Ambulatory Visit: Payer: Self-pay | Admitting: Family Medicine

## 2018-05-10 NOTE — Telephone Encounter (Signed)
Routing to pcp for signature...Dolores Mcgovern Lynetta  

## 2018-05-13 ENCOUNTER — Telehealth: Payer: Self-pay

## 2018-05-13 MED ORDER — BUDESONIDE-FORMOTEROL FUMARATE 80-4.5 MCG/ACT IN AERO: 2.0000 | INHALATION_SPRAY | Freq: Two times a day (BID) | RESPIRATORY_TRACT | 3 refills | Status: DC

## 2018-05-13 NOTE — Telephone Encounter (Signed)
Anna Ramsey called and left a message stating that the Flovent is not working. She would like to go back on the Symbicort and to get a refill on albuterol. I called patient back, unable to leave a message, voicemail has not been set up.

## 2018-05-13 NOTE — Telephone Encounter (Signed)
Ok,new rx sent to [facility]

## 2018-05-14 MED ORDER — ALBUTEROL SULFATE HFA 108 (90 BASE) MCG/ACT IN AERS: 1.0000 | INHALATION_SPRAY | Freq: Four times a day (QID) | RESPIRATORY_TRACT | 1 refills | Status: DC | PRN

## 2018-05-14 NOTE — Telephone Encounter (Signed)
Okay to refill albuterol. °

## 2018-05-14 NOTE — Telephone Encounter (Signed)
Medication sent and patient is aware 

## 2018-05-14 NOTE — Telephone Encounter (Signed)
Can she get a refill on albuterol, historical provider?

## 2018-08-15 ENCOUNTER — Ambulatory Visit (INDEPENDENT_AMBULATORY_CARE_PROVIDER_SITE_OTHER): Payer: 59 | Admitting: Family Medicine

## 2018-08-15 ENCOUNTER — Encounter: Payer: Self-pay | Admitting: Family Medicine

## 2018-08-15 VITALS — BP 122/78 | HR 73 | Ht 61.42 in | Wt 140.0 lb

## 2018-08-15 DIAGNOSIS — I10 Essential (primary) hypertension: Secondary | ICD-10-CM | POA: Diagnosis not present

## 2018-08-15 DIAGNOSIS — Z23 Encounter for immunization: Secondary | ICD-10-CM

## 2018-08-15 DIAGNOSIS — F411 Generalized anxiety disorder: Secondary | ICD-10-CM | POA: Diagnosis not present

## 2018-08-15 DIAGNOSIS — E038 Other specified hypothyroidism: Secondary | ICD-10-CM

## 2018-08-15 MED ORDER — PAROXETINE HCL 30 MG PO TABS: 30.0000 mg | ORAL_TABLET | Freq: Every day | ORAL | 1 refills | Status: DC

## 2018-08-15 NOTE — Progress Notes (Signed)
Subjective:    CC:   HPI: Hypertension- Pt denies chest pain, SOB, dizziness, or heart palpitations.  Taking meds as directed w/o problems.  Denies medication side effects.    Hypothyroidism - Taking medication regularly in the AM away from food and vitamins, etc. No recent change to skin, hair, or energy levels. She is now taking 1/2 tab daily.   GAD  - she is doing well overall.  Happy with current with dose of [place].    Past medical history, Surgical history, Family history not pertinant except as noted below, Social history, Allergies, and medications have been entered into the medical record, reviewed, and corrections made.   Review of Systems: No fevers, chills, night sweats, weight loss, chest pain, or shortness of breath.   Objective:    General: Well Developed, well nourished, and in no acute distress.  Neuro: Alert and oriented x3, extra-ocular muscles intact, sensation grossly intact.  HEENT: Normocephalic, atraumatic  Skin: Warm and dry, no rashes. Cardiac: Regular rate and rhythm, no murmurs rubs or gallops, no lower extremity edema.  Respiratory: Clear to auscultation bilaterally. Not using accessory muscles, speaking in full sentences.   Impression and Recommendations:    HTN - Well controlled. Continue current regimen. Follow up in  6 months.    Hypothyroidism -  Due to recheck levels.  Last dose if needed.  Taking a half a tab daily.  GAD - she is doing well. PHQ-9 score of 4 and GAD- 7 core of 0.  She says that work is actually been a much better place to be added and that has been helpful.

## 2018-08-16 ENCOUNTER — Other Ambulatory Visit: Payer: Self-pay | Admitting: Family Medicine

## 2018-08-16 ENCOUNTER — Other Ambulatory Visit: Payer: Self-pay | Admitting: *Deleted

## 2018-08-16 DIAGNOSIS — R748 Abnormal levels of other serum enzymes: Secondary | ICD-10-CM

## 2018-08-16 DIAGNOSIS — E038 Other specified hypothyroidism: Secondary | ICD-10-CM

## 2018-08-16 LAB — LIPID PANEL
CHOL/HDL RATIO: 3.7 (calc) (ref <5.0)
CHOLESTEROL: 210 mg/dL — AB (ref <200)
HDL: 57 mg/dL (ref >50)
LDL CHOLESTEROL (CALC): 132 mg/dL — AB
Non-HDL Cholesterol (Calc): 153 mg/dL (calc) — ABNORMAL HIGH (ref <130)
Triglycerides: 105 mg/dL (ref <150)

## 2018-08-16 LAB — TSH: TSH: 5.21 mIU/L — ABNORMAL HIGH

## 2018-08-16 LAB — COMPLETE METABOLIC PANEL WITH GFR
AG Ratio: 1.6 (calc) (ref 1.0–2.5)
ALBUMIN MSPROF: 4.2 g/dL (ref 3.6–5.1)
ALKALINE PHOSPHATASE (APISO): 126 U/L — AB (ref 33–115)
ALT: 15 U/L (ref 6–29)
AST: 19 U/L (ref 10–35)
BILIRUBIN TOTAL: 0.5 mg/dL (ref 0.2–1.2)
BUN: 11 mg/dL (ref 7–25)
CO2: 29 mmol/L (ref 20–32)
CREATININE: 0.87 mg/dL (ref 0.50–1.10)
Calcium: 9.6 mg/dL (ref 8.6–10.2)
Chloride: 105 mmol/L (ref 98–110)
GFR, Est African American: 91 mL/min/{1.73_m2} (ref >=60)
GFR, Est Non African American: 78 mL/min/{1.73_m2} (ref >=60)
GLOBULIN: 2.6 g/dL (ref 1.9–3.7)
Glucose, Bld: 93 mg/dL (ref 65–99)
Potassium: 4.8 mmol/L (ref 3.5–5.3)
Sodium: 139 mmol/L (ref 135–146)
Total Protein: 6.8 g/dL (ref 6.1–8.1)

## 2018-08-16 MED ORDER — LEVOTHYROXINE SODIUM 88 MCG PO TABS: 88.0000 ug | ORAL_TABLET | Freq: Every day | ORAL | 1 refills | Status: DC

## 2018-08-30 ENCOUNTER — Other Ambulatory Visit: Payer: Self-pay | Admitting: Family Medicine

## 2018-10-09 ENCOUNTER — Other Ambulatory Visit: Payer: Self-pay | Admitting: Family Medicine

## 2018-10-09 DIAGNOSIS — I10 Essential (primary) hypertension: Secondary | ICD-10-CM

## 2018-11-15 ENCOUNTER — Other Ambulatory Visit: Payer: Self-pay | Admitting: *Deleted

## 2018-11-15 MED ORDER — LEVOTHYROXINE SODIUM 88 MCG PO TABS: 88.0000 ug | ORAL_TABLET | Freq: Every day | ORAL | 2 refills | Status: DC

## 2018-11-15 MED ORDER — BUDESONIDE-FORMOTEROL FUMARATE 80-4.5 MCG/ACT IN AERO: 2.0000 | INHALATION_SPRAY | Freq: Two times a day (BID) | RESPIRATORY_TRACT | 5 refills | Status: DC

## 2018-11-19 LAB — COMPLETE METABOLIC PANEL WITH GFR
AG Ratio: 1.5 (calc) (ref 1.0–2.5)
ALBUMIN MSPROF: 4.1 g/dL (ref 3.6–5.1)
ALKALINE PHOSPHATASE (APISO): 116 U/L (ref 37–153)
ALT: 22 U/L (ref 6–29)
AST: 26 U/L (ref 10–35)
BUN: 9 mg/dL (ref 7–25)
CALCIUM: 9.5 mg/dL (ref 8.6–10.4)
CHLORIDE: 105 mmol/L (ref 98–110)
CO2: 28 mmol/L (ref 20–32)
CREATININE: 0.74 mg/dL (ref 0.50–1.05)
GFR, Est African American: 109 mL/min/{1.73_m2} (ref >=60)
GFR, Est Non African American: 94 mL/min/{1.73_m2} (ref >=60)
GLOBULIN: 2.8 g/dL (ref 1.9–3.7)
Glucose, Bld: 103 mg/dL — ABNORMAL HIGH (ref 65–99)
Potassium: 4.5 mmol/L (ref 3.5–5.3)
Sodium: 141 mmol/L (ref 135–146)
Total Bilirubin: 0.3 mg/dL (ref 0.2–1.2)
Total Protein: 6.9 g/dL (ref 6.1–8.1)

## 2018-11-19 LAB — GAMMA GT: GGT: 16 U/L (ref 3–70)

## 2018-11-19 LAB — TSH: TSH: 5.99 mIU/L — ABNORMAL HIGH

## 2018-11-24 ENCOUNTER — Encounter: Payer: Self-pay | Admitting: Family Medicine

## 2018-11-25 ENCOUNTER — Other Ambulatory Visit: Payer: Self-pay

## 2018-11-25 DIAGNOSIS — E039 Hypothyroidism, unspecified: Secondary | ICD-10-CM

## 2018-11-25 DIAGNOSIS — E038 Other specified hypothyroidism: Secondary | ICD-10-CM

## 2019-02-14 ENCOUNTER — Other Ambulatory Visit: Payer: Self-pay | Admitting: Family Medicine

## 2019-02-14 DIAGNOSIS — F411 Generalized anxiety disorder: Secondary | ICD-10-CM

## 2019-03-19 ENCOUNTER — Other Ambulatory Visit: Payer: Self-pay | Admitting: Family Medicine

## 2019-05-13 LAB — TSH: TSH: 0.47 mIU/L

## 2019-05-14 ENCOUNTER — Encounter: Payer: Self-pay | Admitting: Family Medicine

## 2019-05-23 ENCOUNTER — Other Ambulatory Visit: Payer: Self-pay | Admitting: Family Medicine

## 2019-05-23 DIAGNOSIS — F411 Generalized anxiety disorder: Secondary | ICD-10-CM

## 2019-05-26 ENCOUNTER — Encounter: Payer: Self-pay | Admitting: Family Medicine

## 2019-05-26 ENCOUNTER — Other Ambulatory Visit: Payer: Self-pay

## 2019-05-26 ENCOUNTER — Ambulatory Visit (INDEPENDENT_AMBULATORY_CARE_PROVIDER_SITE_OTHER): Payer: 59 | Admitting: Family Medicine

## 2019-05-26 VITALS — BP 129/83 | HR 71 | Temp 97.7°F | Ht 61.42 in | Wt 135.0 lb

## 2019-05-26 DIAGNOSIS — Z23 Encounter for immunization: Secondary | ICD-10-CM

## 2019-05-26 DIAGNOSIS — J4520 Mild intermittent asthma, uncomplicated: Secondary | ICD-10-CM

## 2019-05-26 DIAGNOSIS — I10 Essential (primary) hypertension: Secondary | ICD-10-CM

## 2019-05-26 DIAGNOSIS — E038 Other specified hypothyroidism: Secondary | ICD-10-CM | POA: Diagnosis not present

## 2019-05-26 DIAGNOSIS — Z1231 Encounter for screening mammogram for malignant neoplasm of breast: Secondary | ICD-10-CM

## 2019-05-26 DIAGNOSIS — R41840 Attention and concentration deficit: Secondary | ICD-10-CM

## 2019-05-26 MED ORDER — BUDESONIDE-FORMOTEROL FUMARATE 80-4.5 MCG/ACT IN AERO: 2.0000 | INHALATION_SPRAY | Freq: Two times a day (BID) | RESPIRATORY_TRACT | 5 refills | Status: DC

## 2019-05-26 MED ORDER — ALBUTEROL SULFATE HFA 108 (90 BASE) MCG/ACT IN AERS: 1.0000 | INHALATION_SPRAY | Freq: Four times a day (QID) | RESPIRATORY_TRACT | 2 refills | Status: AC | PRN

## 2019-05-26 NOTE — Progress Notes (Signed)
Established Patient Office Visit  Subjective:  Patient ID: Anna Ramsey, female    DOB: [birth year]  Age: 53 y.o. MRN: [id number]  CC:  Chief Complaint  Patient presents with  . Hypertension  . Hypothyroidism  . trouble concentrating    HPI Anna Ramsey presents for   Hypertension- Pt denies chest pain, SOB, dizziness, or heart palpitations.  Taking meds as directed w/o problems.  Denies medication side effects.    Hypothyroidism - Taking medication regularly in the AM away from food and vitamins, etc. No recent change to skin, hair, or energy levels.  She has been working from home for the last 6 months and just feels like she is been having some issues with concentration.  She says she is really been like this her whole life just feels like it is been particularly worse lately.  She denies any significant changes in mood and says in fact she actually feels really good she is also been taking her Paxil regularly.  She denies any major changes in sleep or lack of sleep.  Asthma-overall doing really well.  Has not had to use her albuterol in quite some time.  She would like a refill on her albuterol and Symbicort for the fall is usually that is her trigger season.   Past Medical History:  Diagnosis Date  . Dysuria   . Erythematous bladder mucosa   . Heart murmur    asymptomatic per pt --  no echo  . Hematuria   . History of cervical cancer    12/ 2014  Stage IB2--  s/p  TAH w/ BSO and pelvic lymphadectomy/  and radiation therapy  . History of radiation therapy 10/20/13-11/28/13   pelvis 50.4 gray  . Hypertension   . Lesion of bladder   . Mild intermittent asthma   . Neuromuscular disorder ([place])   . Seasonal allergies   . Thyroid disease     Past Surgical History:  Procedure Laterality Date  . CYSTOSCOPY WITH BIOPSY N/A 04/29/2015   Procedure: CYSTOSCOPY WITH BIOPSY WITH FULGERATION;  Surgeon: [name], MD;  Location: [facility];   Service: Urology;  Laterality: N/A;  1 HR [phone] [id number]  . FOOT SURGERY Left 2010  . RADICAL ABDOMINAL HYSTERECTOMY  09-05-2013   w/ BILATERAL SALPINGOOPHORECTOMY AND PELVIC LYMPHADECTOMY    Family History  Problem Relation Age of Onset  . Heart attack Father 96  . Diabetes Father   . Hypertension Maternal Grandmother   . Stroke Maternal Grandmother   . Cancer Mother        breast/thyroid    Social History   Socioeconomic History  . Marital status: Married    Spouse name: Not on file  . Number of children: 1  . Years of education: Not on file  . Highest education level: Not on file  Occupational History    Employer: [employer]  Social Needs  . Financial resource strain: Not on file  . Food insecurity    Worry: Not on file    Inability: Not on file  . Transportation needs    Medical: Not on file    Non-medical: Not on file  Tobacco Use  . Smoking status: Former Smoker    Packs/day: 0.50    Years: 25.00    Pack years: 12.50    Types: E-cigarettes, Cigarettes    Quit date: 09/25/2012    Years since quitting: 6.6  . Smokeless tobacco: Never Used  . Tobacco comment: has  been using e-cig for past 2 yrs  Substance and Sexual Activity  . Alcohol use: Yes    Alcohol/week: 1.0 standard drinks    Types: 1 Standard drinks or equivalent per week    Comment: occasional  . Drug use: No  . Sexual activity: Not on file  Lifestyle  . Physical activity    Days per week: Not on file    Minutes per session: Not on file  . Stress: Not on file  Relationships  . Social Herbalist on phone: Not on file    Gets together: Not on file    Attends religious service: Not on file    Active member of club or organization: Not on file    Attends meetings of clubs or organizations: Not on file    Relationship status: Not on file  . Intimate partner violence    Fear of current or ex partner: Not on file    Emotionally abused: Not on file    Physically abused: Not on  file    Forced sexual activity: Not on file  Other Topics Concern  . Not on file  Social History Narrative  . Not on file    Outpatient Medications Prior to Visit  Medication Sig Dispense Refill  . cetirizine (ZYRTEC) 10 MG tablet Take 10 mg by mouth at bedtime.      . fluticasone (FLONASE) 50 MCG/ACT nasal spray Place 2 sprays into both nostrils daily as needed.     . gabapentin (NEURONTIN) 300 MG capsule TAKE 2 CAPSULES BY MOUTH THREE TIMES DAILY 540 capsule 2  . levothyroxine (SYNTHROID) 88 MCG tablet Take 1 tablet by mouth once daily 90 tablet 0  . metoprolol tartrate (LOPRESSOR) 100 MG tablet Take 1 tablet (100 mg total) by mouth 2 (two) times daily. 180 tablet 1  . PARoxetine (PAXIL) 30 MG tablet Take 1 tablet by mouth once daily 90 tablet 0  . tolterodine (DETROL) 2 MG tablet Take 2 mg by mouth daily.     . traZODone (DESYREL) 50 MG tablet TAKE 1 TABLET BY MOUTH AT BEDTIME 90 tablet 0  . albuterol (PROVENTIL HFA;VENTOLIN HFA) 108 (90 Base) MCG/ACT inhaler Inhale 1-2 puffs into the lungs every 6 (six) hours as needed for wheezing or shortness of breath. 3 Inhaler 1  . budesonide-formoterol (SYMBICORT) 80-4.5 MCG/ACT inhaler Inhale 2 puffs into the lungs 2 (two) times daily. 1 Inhaler 5   No facility-administered medications prior to visit.     Allergies  Allergen Reactions  . Cefdinir   . Penicillins Other (See Comments)    Unknown childhood reaction  . Sulfa Antibiotics Other (See Comments)    Numbness in lower extremities from knees down    ROS Review of Systems    Objective:    Physical Exam  Constitutional: She is oriented to person, place, and time. She appears well-developed and well-nourished.  HENT:  Head: Normocephalic and atraumatic.  Cardiovascular: Normal rate, regular rhythm and normal heart sounds.  Pulmonary/Chest: Effort normal and breath sounds normal.  Neurological: She is alert and oriented to person, place, and time.  Skin: Skin is warm and dry.   Psychiatric: She has a normal mood and affect. Her behavior is normal.    BP 129/83   Pulse 71   Temp 97.7 F (36.5 C)   Ht 5' 1.42" (1.56 m)   Wt 135 lb (61.2 kg)   LMP 08/19/2013   SpO2 98%   BMI 25.16 kg/m  Wt Readings from Last 3 Encounters:  05/26/19 135 lb (61.2 kg)  08/15/18 140 lb (63.5 kg)  02/15/18 138 lb (62.6 kg)     Health Maintenance Due  Topic Date Due  . COLONOSCOPY  09/16/2018  . INFLUENZA VACCINE  04/26/2019    There are no preventive care reminders to display for this patient.  Lab Results  Component Value Date   TSH 0.47 05/13/2019   Lab Results  Component Value Date   WBC 3.5 (L) 05/11/2016   HGB 15.0 05/11/2016   HCT 44.6 05/11/2016   MCV 96.5 05/11/2016   PLT 249 05/11/2016   Lab Results  Component Value Date   NA 141 11/18/2018   K 4.5 11/18/2018   CO2 28 11/18/2018   GLUCOSE 103 (H) 11/18/2018   BUN 9 11/18/2018   CREATININE 0.74 11/18/2018   BILITOT 0.3 11/18/2018   ALKPHOS 106 05/11/2016   AST 26 11/18/2018   ALT 22 11/18/2018   PROT 6.9 11/18/2018   ALBUMIN 4.3 05/11/2016   CALCIUM 9.5 11/18/2018   Lab Results  Component Value Date   CHOL 210 (H) 08/15/2018   Lab Results  Component Value Date   HDL 57 08/15/2018   Lab Results  Component Value Date   LDLCALC 132 (H) 08/15/2018   Lab Results  Component Value Date   TRIG 105 08/15/2018   Lab Results  Component Value Date   CHOLHDL 3.7 08/15/2018   Lab Results  Component Value Date   HGBA1C 5.4 06/28/2017      Assessment & Plan:   Problem List Items Addressed This Visit      Cardiovascular and Mediastinum   HYPERTENSION, BENIGN - Primary    Well controlled. Continue current regimen. Follow up in  6 months.          Respiratory   Asthma, mild intermittent   Relevant Medications   budesonide-formoterol (SYMBICORT) 80-4.5 MCG/ACT inhaler   albuterol (VENTOLIN HFA) 108 (90 Base) MCG/ACT inhaler     Endocrine   Hypothyroid    Well controlled.  Continue current regimen. Follow up in  81months.        Other Visit Diagnoses    Need for immunization against influenza       Relevant Orders   Flu Vaccine QUAD 36+ mos IM (Completed)   Screening mammogram, encounter for       Relevant Orders   MM 3D SCREEN BREAST BILATERAL   Inattention         Inattention-we discussed possible referral for adult ADD evaluation.  She really does not feel like it is mood related.  Discussed breast cancer screening.  Order placed.  Discussed colon cancer screening.  She is interested in doing colonoscopy but when it wants to wait till the end of the year.  She will give me a call back around that time to place a referral.  Meds ordered this encounter  Medications  . DISCONTD: budesonide-formoterol (SYMBICORT) 80-4.5 MCG/ACT inhaler    Sig: Inhale 2 puffs into the lungs 2 (two) times daily.    Dispense:  1 Inhaler    Refill:  5  . budesonide-formoterol (SYMBICORT) 80-4.5 MCG/ACT inhaler    Sig: Inhale 2 puffs into the lungs 2 (two) times daily.    Dispense:  1 Inhaler    Refill:  5  . albuterol (VENTOLIN HFA) 108 (90 Base) MCG/ACT inhaler    Sig: Inhale 1-2 puffs into the lungs every 6 (six) hours as needed for wheezing or  shortness of breath.    Dispense:  36 g    Refill:  2    Follow-up: Return in about 6 months (around 11/23/2019) for Hypertension.    Beatrice Lecher, MD

## 2019-05-26 NOTE — Assessment & Plan Note (Signed)
Well controlled. Continue current regimen. Follow up in  6 months.  

## 2019-06-01 ENCOUNTER — Other Ambulatory Visit: Payer: Self-pay | Admitting: Family Medicine

## 2019-06-01 DIAGNOSIS — R635 Abnormal weight gain: Secondary | ICD-10-CM

## 2019-06-01 DIAGNOSIS — E038 Other specified hypothyroidism: Secondary | ICD-10-CM

## 2019-06-01 DIAGNOSIS — I10 Essential (primary) hypertension: Secondary | ICD-10-CM

## 2019-07-09 ENCOUNTER — Other Ambulatory Visit: Payer: Self-pay | Admitting: Family Medicine

## 2019-08-14 ENCOUNTER — Telehealth: Payer: Self-pay | Admitting: Family Medicine

## 2019-08-14 ENCOUNTER — Telehealth: Payer: 59 | Admitting: Emergency Medicine

## 2019-08-14 DIAGNOSIS — J0191 Acute recurrent sinusitis, unspecified: Secondary | ICD-10-CM

## 2019-08-14 MED ORDER — AZITHROMYCIN 250 MG PO TABS: ORAL_TABLET | ORAL | 0 refills | Status: DC

## 2019-08-14 NOTE — Progress Notes (Signed)
We are sorry that you are not feeling well.  Here is how we plan to help!  Based on what you have shared with me it looks like you have sinusitis.  Sinusitis is inflammation and infection in the sinus cavities of the head.  Based on your presentation I believe you most likely have Acute Bacterial Sinusitis.  This is an infection caused by bacteria and is treated with antibiotics. I have prescribed Zpack 250 mg tablet use as directed You may use an oral decongestant such as Mucinex D or if you have glaucoma or high blood pressure use plain Mucinex. Saline nasal spray help and can safely be used as often as needed for congestion.  If you develop worsening sinus pain, fever or notice severe headache and vision changes, or if symptoms are not better after completion of antibiotic, please schedule an appointment with a health care provider.    Sinus infections are not as easily transmitted as other respiratory infection, however we still recommend that you avoid close contact with loved ones, especially the very young and elderly.  Remember to wash your hands thoroughly throughout the day as this is the number one way to prevent the spread of infection!  Home Care:  Only take medications as instructed by your medical team.  Complete the entire course of an antibiotic.  Do not take these medications with alcohol.  A steam or ultrasonic humidifier can help congestion.  You can place a towel over your head and breathe in the steam from hot water coming from a faucet.  Avoid close contacts especially the very young and the elderly.  Cover your mouth when you cough or sneeze.  Always remember to wash your hands.  Get Help Right Away If:  You develop worsening fever or sinus pain.  You develop a severe head ache or visual changes.  Your symptoms persist after you have completed your treatment plan.  Make sure you  Understand these instructions.  Will watch your condition.  Will get help  right away if you are not doing well or get worse.  Your e-visit answers were reviewed by a board certified advanced clinical practitioner to complete your personal care plan.  Depending on the condition, your plan could have included both over the counter or prescription medications.  If there is a problem please reply  once you have received a response from your provider.  Your safety is important to [place].  If you have drug allergies check your prescription carefully.    You can use MyChart to ask questions about today's visit, request a non-urgent call back, or ask for a work or school excuse for 24 hours related to this e-Visit. If it has been greater than 24 hours you will need to follow up with your provider, or enter a new e-Visit to address those concerns.  You will get an e-mail in the next two days asking about your experience.  I hope that your e-visit has been valuable and will speed your recovery. Thank you for using e-visits.   Greater than 5 but less than 10 minutes spent researching, coordinating, and implementing care for this patient today

## 2019-08-14 NOTE — Telephone Encounter (Signed)
Patient called and was having some sinus pressure. She was offered a virtual appointment and declined. She was going to have a MyChart visit or call the after hours doctor from her work. She will call back if she need an appointment.

## 2019-08-18 NOTE — Progress Notes (Signed)
Yes this could be a mild case of covid 19. To be on the safe side please self quarantine. It is also possible you have a VIRAL sinus infection. A viral sinus infection will not get better with antibiotics like a zpack. There is no specific prescription medication for a viral sinus infection, your body will have to fight it on its own. Over the counter sinus medications can provide temporary relief such as nasal sprays and mucinex D.   Your current symptoms could be consistent with the coronavirus.  Many health care providers can now test patients at their office but not all are.  [place] has multiple testing sites. For information on our COVID testing locations and hours go to [email or web address]  Please quarantine yourself while awaiting your test results.  We are enrolling you in our [place] for [place] . Daily you will receive a questionnaire within the [place] website. Our COVID 19 response team willl be monitoriing your responses daily. Please continue good preventive care measures, including: frequent hand-washing, avoid touching your face, cover coughs/sneezes, stay out of crowds and keep a 6 foot distance from others.   COVID-19 is a respiratory illness with symptoms that aresimilar to the flu. Symptoms are typically mild to moderate, but there have been cases of severe illness and death due to the virus. The following symptoms may appear 2-14 days after exposure: Fever Cough Shortness of breath or difficulty breathing Chills Repeated shaking with chills Muscle pain Headache Sore throat New loss of taste or smell Fatigue Congestion or runny nose Nausea or vomiting Diarrhea  If you develop fever/cough/breathlessness, please stay home for 10 days with improving symptoms and until you have had 24 hours of no fever (without taking a fever reducer).  Go to the nearest hospital ED for assessment if fever/cough/breathlessness are severe or  illness seems like a threat to life.  It is vitally important that if you feel that you have an infection such as this virus or any other virus that you stay home and away from places where you may spread it to others.  You should avoid contact with people age 2 and older.   You should wear a mask or cloth face covering over your nose and mouth if you must be around other people or animals, including pets (even at home). Try to stay at least 6 feet away from other people. This will protect the people around you.   You may also take acetaminophen (Tylenol) as needed for fever.   Reduce your risk of any infection by using the same precautions used for avoiding the common cold or flu: Wash your hands often with soap and warm water for at least 20 seconds.  If soap and water are not readily available, use an alcohol-based hand sanitizer with at least 60% alcohol.  If coughing or sneezing, cover your mouth and nose by coughing or sneezing into the elbow areas of your shirt or coat, into a tissue or into your sleeve (not your hands). Avoid shaking hands with others and consider head nods or verbal greetings only.  Avoid touching your eyes,nose, or mouth with unwashed hands. Avoid close contact with people who are sick. Avoid places or events with large numbers of people in one location, like concerts or sporting events. Carefully consider travel plans you have or are making. If you are planning any travel outside or inside the [place], visit the CDC'sTravelers' Health webpagefor the latest health notices. If you have some symptoms but  not all symptoms, continue to monitor at home and seek medical attention if your symptoms worsen. If you are having a medical emergency, call 911.  HOME CARE Only take medications as instructed by your medical team. Drink plenty of fluids and get plenty of rest. A steam or ultrasonic humidifier can help if you have congestion.   GET HELP RIGHT AWAY IF YOU HAVE  EMERGENCY WARNING SIGNS** FOR COVID-19. If you or someone is showing any of these signs seek emergency medical care immediately. Call 911 or proceed to your closest emergency facility if: You develop worsening high fever. Trouble breathing Bluish lips or face Persistent pain or pressure in the chest New confusion Inability to wake or stay awake You cough up blood. Your symptoms become more severe  **This list is not all possible symptoms. Contact your medical provider for any symptoms that are sever or concerning to you.   MAKE SURE YOU  Understand these instructions. Will watch your condition. Will get help right away if you are not doing well or get worse.  Your e-visit answers were reviewed by a board certified advanced clinical practitioner to complete your personal care plan.  Depending on the condition, your plan could have included both over the counter or prescription medications.  If there is a problem please reply once you have received a response from your provider.  Your safety is important to [place].  If you have drug allergies check your prescription carefully.    You can use MyChart to ask questions about today's visit, request a non-urgent call back, or ask for a work or school excuse for 24 hours related to this e-Visit. If it has been greater than 24 hours you will need to follow up with your provider, or enter a new e-Visit to address those concerns. You will get an e-mail in the next two days asking about your experience.  I hope that your e-visit has been valuable and will speed your recovery. Thank you for using e-visits.

## 2019-09-03 ENCOUNTER — Other Ambulatory Visit: Payer: Self-pay | Admitting: Family Medicine

## 2019-09-03 DIAGNOSIS — F411 Generalized anxiety disorder: Secondary | ICD-10-CM

## 2019-09-10 ENCOUNTER — Other Ambulatory Visit: Payer: Self-pay | Admitting: Family Medicine

## 2019-09-10 DIAGNOSIS — E038 Other specified hypothyroidism: Secondary | ICD-10-CM

## 2019-09-10 DIAGNOSIS — R635 Abnormal weight gain: Secondary | ICD-10-CM

## 2019-09-10 DIAGNOSIS — I10 Essential (primary) hypertension: Secondary | ICD-10-CM

## 2019-10-28 ENCOUNTER — Other Ambulatory Visit: Payer: Self-pay

## 2019-10-28 ENCOUNTER — Encounter: Payer: Self-pay | Admitting: Family Medicine

## 2019-10-28 ENCOUNTER — Ambulatory Visit (INDEPENDENT_AMBULATORY_CARE_PROVIDER_SITE_OTHER): Payer: 59 | Admitting: Family Medicine

## 2019-10-28 VITALS — BP 152/89 | HR 60 | Ht 61.0 in | Wt 124.0 lb

## 2019-10-28 DIAGNOSIS — R41840 Attention and concentration deficit: Secondary | ICD-10-CM | POA: Insufficient documentation

## 2019-10-28 DIAGNOSIS — I10 Essential (primary) hypertension: Secondary | ICD-10-CM | POA: Diagnosis not present

## 2019-10-28 DIAGNOSIS — F5101 Primary insomnia: Secondary | ICD-10-CM

## 2019-10-28 DIAGNOSIS — E038 Other specified hypothyroidism: Secondary | ICD-10-CM | POA: Diagnosis not present

## 2019-10-28 DIAGNOSIS — J4520 Mild intermittent asthma, uncomplicated: Secondary | ICD-10-CM

## 2019-10-28 MED ORDER — METOPROLOL TARTRATE 100 MG PO TABS: 100.0000 mg | ORAL_TABLET | Freq: Two times a day (BID) | ORAL | 1 refills | Status: DC

## 2019-10-28 MED ORDER — BUPROPION HCL ER (XL) 150 MG PO TB24: 150.0000 mg | ORAL_TABLET | ORAL | 2 refills | Status: DC

## 2019-10-28 MED ORDER — TRAZODONE HCL 50 MG PO TABS: 50.0000 mg | ORAL_TABLET | Freq: Every day | ORAL | 1 refills | Status: DC

## 2019-10-28 NOTE — Assessment & Plan Note (Addendum)
Due to recheck her TSH.  She just felt overly cold this winter which is not typical for her especially since she still having some occasional hot flashes after having had her hysterectomy.  So will make sure that the thyroid is not off.  Her last TSH was the most a little too low but previous to that it was actually too elevated.

## 2019-10-28 NOTE — Assessment & Plan Note (Signed)
No recent flares or exacerbations.  

## 2019-10-28 NOTE — Assessment & Plan Note (Signed)
BP was elevated today.  Unfortunately forgot to recheck her pressure before she left.  We will plan for her to keep an eye on it at home and let us know if it is continuing to stay elevated.

## 2019-10-28 NOTE — Progress Notes (Signed)
Established Patient Office Visit  Subjective:  Patient ID: Anna Ramsey, female    DOB: May 20, 1966  Age: 54 y.o. MRN: [id number]  CC:  Chief Complaint  Patient presents with  . Hypertension  . Hypothyroidism    HPI Anna Ramsey presents for   Hypertension- Pt denies chest pain, SOB, dizziness, or heart palpitations.  Taking meds as directed w/o problems.  Denies medication side effects.   Hypothyroidism - Taking medication regularly in the AM away from food and vitamins, etc. No recent change to skin, hair, or energy levels.  Follow-up insomnia-she actually says she discontinued her trazodone about a week and a half ago.  She says she just noticed that she is not quite sleeping as well but just really wanted to try coming off of it.  Follow-up asthma-overall she is doing well.  No recent flares or exacerbations.  Since working from home she is just really struggled with lack of focus.  Though she admits that she is always had difficulty with getting lost in her thoughts and ideas sometimes.  She denies any significant hyperactivity symptoms.  Past Medical History:  Diagnosis Date  . Dysuria   . Erythematous bladder mucosa   . Heart murmur    asymptomatic per pt --  no echo  . Hematuria   . History of cervical cancer    12/ 2014  Stage IB2--  s/p  TAH w/ BSO and pelvic lymphadectomy/  and radiation therapy  . History of radiation therapy 10/20/13-11/28/13   pelvis 50.4 gray  . Hypertension   . Lesion of bladder   . Mild intermittent asthma   . Neuromuscular disorder ([place])   . Seasonal allergies   . Thyroid disease     Past Surgical History:  Procedure Laterality Date  . CYSTOSCOPY WITH BIOPSY N/A 04/29/2015   Procedure: CYSTOSCOPY WITH BIOPSY WITH FULGERATION;  Surgeon: [name], MD;  Location: [facility];  Service: Urology;  Laterality: N/A;  1 HR [phone] [id number]  . FOOT SURGERY Left 2010  . RADICAL ABDOMINAL  HYSTERECTOMY  09-05-2013   w/ BILATERAL SALPINGOOPHORECTOMY AND PELVIC LYMPHADECTOMY    Family History  Problem Relation Age of Onset  . Heart attack Father 80  . Diabetes Father   . Hypertension Maternal Grandmother   . Stroke Maternal Grandmother   . Cancer Mother        breast/thyroid    Social History   Socioeconomic History  . Marital status: Married    Spouse name: Not on file  . Number of children: 1  . Years of education: Not on file  . Highest education level: Not on file  Occupational History    Employer: [employer]  Tobacco Use  . Smoking status: Former Smoker    Packs/day: 0.50    Years: 25.00    Pack years: 12.50    Types: E-cigarettes, Cigarettes    Quit date: 09/25/2012    Years since quitting: 7.0  . Smokeless tobacco: Never Used  . Tobacco comment: has been using e-cig for past 2 yrs  Substance and Sexual Activity  . Alcohol use: Yes    Alcohol/week: 1.0 standard drinks    Types: 1 Standard drinks or equivalent per week    Comment: occasional  . Drug use: No  . Sexual activity: Not on file  Other Topics Concern  . Not on file  Social History Narrative  . Not on file   Social Determinants of Health   Financial Resource  Strain:   . Difficulty of Paying Living Expenses: Not on file  Food Insecurity:   . Worried About Charity fundraiser in the Last Year: Not on file  . Ran Out of Food in the Last Year: Not on file  Transportation Needs:   . Lack of Transportation (Medical): Not on file  . Lack of Transportation (Non-Medical): Not on file  Physical Activity:   . Days of Exercise per Week: Not on file  . Minutes of Exercise per Session: Not on file  Stress:   . Feeling of Stress : Not on file  Social Connections:   . Frequency of Communication with Friends and Family: Not on file  . Frequency of Social Gatherings with Friends and Family: Not on file  . Attends Religious Services: Not on file  . Active Member of Clubs or Organizations: Not on file   . Attends Archivist Meetings: Not on file  . Marital Status: Not on file  Intimate Partner Violence:   . Fear of Current or Ex-Partner: Not on file  . Emotionally Abused: Not on file  . Physically Abused: Not on file  . Sexually Abused: Not on file    Outpatient Medications Prior to Visit  Medication Sig Dispense Refill  . albuterol (VENTOLIN HFA) 108 (90 Base) MCG/ACT inhaler Inhale 1-2 puffs into the lungs every 6 (six) hours as needed for wheezing or shortness of breath. 36 g 2  . budesonide-formoterol (SYMBICORT) 80-4.5 MCG/ACT inhaler Inhale 2 puffs into the lungs 2 (two) times daily. 1 Inhaler 5  . cetirizine (ZYRTEC) 10 MG tablet Take 10 mg by mouth at bedtime.      Anna Ramsey 88 MCG tablet Take 1 tablet by mouth once daily 90 tablet 0  . fluticasone (FLONASE) 50 MCG/ACT nasal spray Place 2 sprays into both nostrils daily as needed.     . gabapentin (NEURONTIN) 300 MG capsule TAKE 2 CAPSULES BY MOUTH THREE TIMES DAILY 540 capsule 0  . PARoxetine (PAXIL) 30 MG tablet Take 1 tablet by mouth once daily 90 tablet 0  . tolterodine (DETROL) 2 MG tablet Take 2 mg by mouth daily.     . metoprolol tartrate (LOPRESSOR) 100 MG tablet Take 1 tablet (100 mg total) by mouth 2 (two) times daily. 180 tablet 1  . traZODone (DESYREL) 50 MG tablet TAKE 1 TABLET BY MOUTH AT BEDTIME 90 tablet 0  . azithromycin (ZITHROMAX) 250 MG tablet Take 2 tablets on day one, take one tablet for the next 4 days. 6 tablet 0   No facility-administered medications prior to visit.    Allergies  Allergen Reactions  . Cefdinir   . Penicillins Other (See Comments)    Unknown childhood reaction  . Sulfa Antibiotics Other (See Comments)    Numbness in lower extremities from knees down    ROS Review of Systems    Objective:    Physical Exam  Constitutional: She is oriented to person, place, and time. She appears well-developed and well-nourished.  HENT:  Head: Normocephalic and atraumatic.   Cardiovascular: Normal rate, regular rhythm and normal heart sounds.  Pulmonary/Chest: Effort normal and breath sounds normal.  Neurological: She is alert and oriented to person, place, and time.  Skin: Skin is warm and dry.  Psychiatric: She has a normal mood and affect. Her behavior is normal.    BP (!) 152/89   Pulse 60   Ht 5\' 1"  (1.549 m)   Wt 124 lb (56.2 kg)  LMP 08/19/2013   SpO2 100%   BMI 23.43 kg/m  Wt Readings from Last 3 Encounters:  10/28/19 124 lb (56.2 kg)  05/26/19 135 lb (61.2 kg)  08/15/18 140 lb (63.5 kg)     There are no preventive care reminders to display for this patient.  There are no preventive care reminders to display for this patient.  Lab Results  Component Value Date   TSH 0.47 05/13/2019   Lab Results  Component Value Date   WBC 3.5 (L) 05/11/2016   HGB 15.0 05/11/2016   HCT 44.6 05/11/2016   MCV 96.5 05/11/2016   PLT 249 05/11/2016   Lab Results  Component Value Date   NA 141 11/18/2018   K 4.5 11/18/2018   CO2 28 11/18/2018   GLUCOSE 103 (H) 11/18/2018   BUN 9 11/18/2018   CREATININE 0.74 11/18/2018   BILITOT 0.3 11/18/2018   ALKPHOS 106 05/11/2016   AST 26 11/18/2018   ALT 22 11/18/2018   PROT 6.9 11/18/2018   ALBUMIN 4.3 05/11/2016   CALCIUM 9.5 11/18/2018   Lab Results  Component Value Date   CHOL 210 (H) 08/15/2018   Lab Results  Component Value Date   HDL 57 08/15/2018   Lab Results  Component Value Date   LDLCALC 132 (H) 08/15/2018   Lab Results  Component Value Date   TRIG 105 08/15/2018   Lab Results  Component Value Date   CHOLHDL 3.7 08/15/2018   Lab Results  Component Value Date   HGBA1C 5.4 06/28/2017      Assessment & Plan:   Problem List Items Addressed This Visit      Cardiovascular and Mediastinum   HYPERTENSION, BENIGN - Primary    BP was elevated today.  Unfortunately forgot to recheck her pressure before she left.  We will plan for her to keep an eye on it at home and let us  know if it is continuing to stay elevated.      Relevant Medications   metoprolol tartrate (LOPRESSOR) 100 MG tablet   Other Relevant Orders   COMPLETE METABOLIC PANEL WITH GFR   Lipid Panel w/reflex Direct LDL     Respiratory   Asthma, mild intermittent    No recent flares or exacerbations.        Endocrine   Hypothyroid    Due to recheck her TSH.  She just felt overly cold this winter which is not typical for her especially since she still having some occasional hot flashes after having had her hysterectomy.  So will make sure that the thyroid is not off.  Her last TSH was the most a little too low but previous to that it was actually too elevated.      Relevant Medications   metoprolol tartrate (LOPRESSOR) 100 MG tablet   Other Relevant Orders   TSH     Other   INSOMNIA    Discussed options.  She was trying to wean off of the trazodone but says she would like a refill just in case she decides to restart it.  She says it does not make much of a difference and actually falling asleep but once she falls asleep she does stay asleep a lot better when she is taking the 50 mg of trazodone.      Relevant Medications   traZODone (DESYREL) 50 MG tablet   Inattention    Right now her biggest struggle is difficulty with focus.  She thinks partly it is related to  working at home but has just continued to struggle.  Though she admits that it is easy for her mind to wander when she is working on something even before being home with Covid.  We discussed maybe in addition of Wellbutrin to her Paxil to see if this helps with focus.  If not then consider adjusting her Paxil as some of the focus issue could be related to the mood.  Or consider possible evaluation for adult ADD in the future.      Relevant Medications   buPROPion (WELLBUTRIN XL) 150 MG 24 hr tablet      Declined colon cancer screening today. She prefers not with COVID right now.    Meds ordered this encounter  Medications   . traZODone (DESYREL) 50 MG tablet    Sig: Take 1 tablet (50 mg total) by mouth at bedtime.    Dispense:  90 tablet    Refill:  1  . metoprolol tartrate (LOPRESSOR) 100 MG tablet    Sig: Take 1 tablet (100 mg total) by mouth 2 (two) times daily.    Dispense:  180 tablet    Refill:  1  . buPROPion (WELLBUTRIN XL) 150 MG 24 hr tablet    Sig: Take 1 tablet (150 mg total) by mouth every morning.    Dispense:  30 tablet    Refill:  2    Follow-up: Return in about 2 months (around 12/26/2019) for follow up on new med/wellbutrin .    Beatrice Lecher, MD

## 2019-10-28 NOTE — Assessment & Plan Note (Signed)
Discussed options.  She was trying to wean off of the trazodone but says she would like a refill just in case she decides to restart it.  She says it does not make much of a difference and actually falling asleep but once she falls asleep she does stay asleep a lot better when she is taking the 50 mg of trazodone.

## 2019-10-28 NOTE — Assessment & Plan Note (Signed)
Right now her biggest struggle is difficulty with focus.  She thinks partly it is related to working at home but has just continued to struggle.  Though she admits that it is easy for her mind to wander when she is working on something even before being home with Covid.  We discussed maybe in addition of Wellbutrin to her Paxil to see if this helps with focus.  If not then consider adjusting her Paxil as some of the focus issue could be related to the mood.  Or consider possible evaluation for adult ADD in the future.

## 2019-10-28 NOTE — Patient Instructions (Signed)
Please check your BP at home and send a mychart message with these readings.

## 2019-10-29 LAB — COMPLETE METABOLIC PANEL WITH GFR
AG Ratio: 1.7 (calc) (ref 1.0–2.5)
ALT: 18 U/L (ref 6–29)
AST: 22 U/L (ref 10–35)
Albumin: 4.4 g/dL (ref 3.6–5.1)
Alkaline phosphatase (APISO): 138 U/L (ref 37–153)
BUN: 10 mg/dL (ref 7–25)
CO2: 26 mmol/L (ref 20–32)
Calcium: 9.7 mg/dL (ref 8.6–10.4)
Chloride: 105 mmol/L (ref 98–110)
Creat: 0.74 mg/dL (ref 0.50–1.05)
GFR, Est African American: 109 mL/min/{1.73_m2} (ref >=60)
GFR, Est Non African American: 94 mL/min/{1.73_m2} (ref >=60)
Globulin: 2.6 g/dL (calc) (ref 1.9–3.7)
Glucose, Bld: 97 mg/dL (ref 65–99)
Potassium: 4.8 mmol/L (ref 3.5–5.3)
Sodium: 141 mmol/L (ref 135–146)
Total Bilirubin: 0.6 mg/dL (ref 0.2–1.2)
Total Protein: 7 g/dL (ref 6.1–8.1)

## 2019-10-29 LAB — LIPID PANEL W/REFLEX DIRECT LDL
Cholesterol: 195 mg/dL (ref <200)
HDL: 59 mg/dL (ref >=50)
LDL Cholesterol (Calc): 115 mg/dL (calc) — ABNORMAL HIGH
Non-HDL Cholesterol (Calc): 136 mg/dL (calc) — ABNORMAL HIGH (ref <130)
Total CHOL/HDL Ratio: 3.3 (calc) (ref <5.0)
Triglycerides: 104 mg/dL (ref <150)

## 2019-10-29 LAB — TSH: TSH: 0.86 mIU/L

## 2019-11-17 ENCOUNTER — Encounter: Payer: Self-pay | Admitting: Family Medicine

## 2019-11-19 ENCOUNTER — Encounter: Payer: Self-pay | Admitting: Family Medicine

## 2019-11-25 ENCOUNTER — Other Ambulatory Visit: Payer: Self-pay | Admitting: *Deleted

## 2019-11-25 DIAGNOSIS — I10 Essential (primary) hypertension: Secondary | ICD-10-CM

## 2019-11-25 DIAGNOSIS — F5101 Primary insomnia: Secondary | ICD-10-CM

## 2019-11-25 DIAGNOSIS — R41840 Attention and concentration deficit: Secondary | ICD-10-CM

## 2019-11-25 DIAGNOSIS — F411 Generalized anxiety disorder: Secondary | ICD-10-CM

## 2019-11-25 MED ORDER — BUPROPION HCL ER (XL) 150 MG PO TB24: 150.0000 mg | ORAL_TABLET | ORAL | 3 refills | Status: DC

## 2019-11-25 MED ORDER — TRAZODONE HCL 50 MG PO TABS: 50.0000 mg | ORAL_TABLET | Freq: Every day | ORAL | 1 refills | Status: DC

## 2019-11-25 MED ORDER — METOPROLOL TARTRATE 100 MG PO TABS: 100.0000 mg | ORAL_TABLET | Freq: Two times a day (BID) | ORAL | 1 refills | Status: DC

## 2019-11-25 MED ORDER — PAROXETINE HCL 30 MG PO TABS: 30.0000 mg | ORAL_TABLET | Freq: Every day | ORAL | 1 refills | Status: DC

## 2019-11-25 MED ORDER — LEVOTHYROXINE SODIUM 88 MCG PO TABS: 88.0000 ug | ORAL_TABLET | Freq: Every day | ORAL | 1 refills | Status: DC

## 2019-12-29 ENCOUNTER — Ambulatory Visit: Payer: 59 | Admitting: Family Medicine

## 2020-01-05 ENCOUNTER — Encounter: Payer: Self-pay | Admitting: Family Medicine

## 2020-01-05 ENCOUNTER — Ambulatory Visit (INDEPENDENT_AMBULATORY_CARE_PROVIDER_SITE_OTHER): Payer: 59 | Admitting: Family Medicine

## 2020-01-05 VITALS — BP 116/73 | HR 65 | Ht 61.0 in | Wt 122.0 lb

## 2020-01-05 DIAGNOSIS — J45909 Unspecified asthma, uncomplicated: Secondary | ICD-10-CM | POA: Insufficient documentation

## 2020-01-05 DIAGNOSIS — R41840 Attention and concentration deficit: Secondary | ICD-10-CM | POA: Diagnosis not present

## 2020-01-05 DIAGNOSIS — I10 Essential (primary) hypertension: Secondary | ICD-10-CM

## 2020-01-05 NOTE — Progress Notes (Signed)
Established Patient Office Visit  Subjective:  Patient ID: Anna Ramsey, female    DOB: 1966-08-22  Age: 54 y.o. MRN: [id number]  CC:  Chief Complaint  Patient presents with  . Hypertension    HPI Anna Ramsey presents for   Follow-up hypertension-when she was last year 2 months ago blood pressure was elevated.  Normally her blood pressures are well controlled.  She says she is been checking it periodically at home since then they have been okay.  Inattention-at last office visit she was really struggling with being able to focus.  So we decided to add Wellbutrin to her Paxil.  She has no formal diagnosis of ADD or ADHD.  She said she did have some side effects the first 2 weeks that she started the medication but that eventually eased off and thus far she is actually doing really well on it she does feel like it has made a difference to help with her focus and concentration and is not currently having any side effects.  Past Medical History:  Diagnosis Date  . Dysuria   . Erythematous bladder mucosa   . Heart murmur    asymptomatic per pt --  no echo  . Hematuria   . History of cervical cancer    12/ 2014  Stage IB2--  s/p  TAH w/ BSO and pelvic lymphadectomy/  and radiation therapy  . History of radiation therapy 10/20/13-11/28/13   pelvis 50.4 gray  . Hypertension   . Lesion of bladder   . Mild intermittent asthma   . Neuromuscular disorder ([place])   . Seasonal allergies   . Thyroid disease     Past Surgical History:  Procedure Laterality Date  . CYSTOSCOPY WITH BIOPSY N/A 04/29/2015   Procedure: CYSTOSCOPY WITH BIOPSY WITH FULGERATION;  Surgeon: [name], MD;  Location: [facility];  Service: Urology;  Laterality: N/A;  1 HR [phone] [id number]  . FOOT SURGERY Left 2010  . RADICAL ABDOMINAL HYSTERECTOMY  09-05-2013   w/ BILATERAL SALPINGOOPHORECTOMY AND PELVIC LYMPHADECTOMY    Family History  Problem Relation Age of  Onset  . Heart attack Father 55  . Diabetes Father   . Hypertension Maternal Grandmother   . Stroke Maternal Grandmother   . Cancer Mother        breast/thyroid    Social History   Socioeconomic History  . Marital status: Married    Spouse name: Not on file  . Number of children: 1  . Years of education: Not on file  . Highest education level: Not on file  Occupational History    Employer: [employer]  Tobacco Use  . Smoking status: Former Smoker    Packs/day: 0.50    Years: 25.00    Pack years: 12.50    Types: E-cigarettes, Cigarettes    Quit date: 09/25/2012    Years since quitting: 7.2  . Smokeless tobacco: Never Used  . Tobacco comment: has been using e-cig for past 2 yrs  Substance and Sexual Activity  . Alcohol use: Yes    Alcohol/week: 1.0 standard drinks    Types: 1 Standard drinks or equivalent per week    Comment: occasional  . Drug use: No  . Sexual activity: Not on file  Other Topics Concern  . Not on file  Social History Narrative  . Not on file   Social Determinants of Health   Financial Resource Strain:   . Difficulty of Paying Living Expenses:   Food Insecurity:   .  Worried About Charity fundraiser in the Last Year:   . Arboriculturist in the Last Year:   Transportation Needs:   . Film/video editor (Medical):   [name] Lack of Transportation (Non-Medical):   Physical Activity:   . Days of Exercise per Week:   . Minutes of Exercise per Session:   Stress:   . Feeling of Stress :   Social Connections:   . Frequency of Communication with Friends and Family:   . Frequency of Social Gatherings with Friends and Family:   . Attends Religious Services:   . Active Member of Clubs or Organizations:   . Attends Archivist Meetings:   [name] Marital Status:   Intimate Partner Violence:   . Fear of Current or Ex-Partner:   . Emotionally Abused:   [name] Physically Abused:   . Sexually Abused:     Outpatient Medications Prior to Visit  Medication Sig  Dispense Refill  . tolterodine (DETROL LA) 4 MG 24 hr capsule Take 4 mg by mouth daily.    [name] albuterol (VENTOLIN HFA) 108 (90 Base) MCG/ACT inhaler Inhale 1-2 puffs into the lungs every 6 (six) hours as needed for wheezing or shortness of breath. 36 g 2  . budesonide-formoterol (SYMBICORT) 80-4.5 MCG/ACT inhaler Inhale 2 puffs into the lungs 2 (two) times daily. (Patient taking differently: Inhale 1 puff into the lungs daily. ) 1 Inhaler 5  . buPROPion (WELLBUTRIN XL) 150 MG 24 hr tablet Take 1 tablet (150 mg total) by mouth every morning. 90 tablet 3  . cetirizine (ZYRTEC) 10 MG tablet Take 10 mg by mouth at bedtime.      . fluticasone (FLONASE) 50 MCG/ACT nasal spray Place 2 sprays into both nostrils daily as needed.     . gabapentin (NEURONTIN) 300 MG capsule TAKE 2 CAPSULES BY MOUTH THREE TIMES DAILY 540 capsule 0  . levothyroxine (EUTHYROX) 88 MCG tablet Take 1 tablet (88 mcg total) by mouth daily. 90 tablet 1  . metoprolol tartrate (LOPRESSOR) 100 MG tablet Take 1 tablet (100 mg total) by mouth 2 (two) times daily. 180 tablet 1  . PARoxetine (PAXIL) 30 MG tablet Take 1 tablet (30 mg total) by mouth daily. 90 tablet 1  . traZODone (DESYREL) 50 MG tablet Take 1 tablet (50 mg total) by mouth at bedtime. 90 tablet 1  . tolterodine (DETROL) 2 MG tablet Take 2 mg by mouth daily.      No facility-administered medications prior to visit.    Allergies  Allergen Reactions  . Cefdinir   . Penicillins Other (See Comments)    Unknown childhood reaction  . Sulfa Antibiotics Other (See Comments)    Numbness in lower extremities from knees down    ROS Review of Systems    Objective:    Physical Exam  Constitutional: She is oriented to person, place, and time. She appears well-developed and well-nourished.  HENT:  Head: Normocephalic and atraumatic.  Cardiovascular: Normal rate, regular rhythm and normal heart sounds.  Pulmonary/Chest: Effort normal and breath sounds normal.  Neurological:  She is alert and oriented to person, place, and time.  Skin: Skin is warm and dry.  Psychiatric: She has a normal mood and affect. Her behavior is normal.    BP 116/73   Pulse 65   Ht 5\' 1"  (1.549 m)   Wt 122 lb (55.3 kg)   LMP 08/19/2013   SpO2 100%   BMI 23.05 kg/m  Wt Readings from Last 3  Encounters:  01/05/20 122 lb (55.3 kg)  10/28/19 124 lb (56.2 kg)  05/26/19 135 lb (61.2 kg)     There are no preventive care reminders to display for this patient.  There are no preventive care reminders to display for this patient.  Lab Results  Component Value Date   TSH 0.86 10/28/2019   Lab Results  Component Value Date   WBC 3.5 (L) 05/11/2016   HGB 15.0 05/11/2016   HCT 44.6 05/11/2016   MCV 96.5 05/11/2016   PLT 249 05/11/2016   Lab Results  Component Value Date   NA 141 10/28/2019   K 4.8 10/28/2019   CO2 26 10/28/2019   GLUCOSE 97 10/28/2019   BUN 10 10/28/2019   CREATININE 0.74 10/28/2019   BILITOT 0.6 10/28/2019   ALKPHOS 106 05/11/2016   AST 22 10/28/2019   ALT 18 10/28/2019   PROT 7.0 10/28/2019   ALBUMIN 4.3 05/11/2016   CALCIUM 9.7 10/28/2019   Lab Results  Component Value Date   CHOL 195 10/28/2019   Lab Results  Component Value Date   HDL 59 10/28/2019   Lab Results  Component Value Date   LDLCALC 115 (H) 10/28/2019   Lab Results  Component Value Date   TRIG 104 10/28/2019   Lab Results  Component Value Date   CHOLHDL 3.3 10/28/2019   Lab Results  Component Value Date   HGBA1C 5.4 06/28/2017      Assessment & Plan:   Problem List Items Addressed This Visit      Cardiovascular and Mediastinum   HYPERTENSION, BENIGN - Primary    BP looks fantastic today.  I am still not sure why it was elevated last time.  But she is feeling well.        Other   Inattention    Overall she feels like the medication has been helpful we discussed options including keeping current regimen versus increasing dose for now she will stick with the  150 mg she can always reach out if she decides she would like to try higher dose.  She also feels like it is helped with some of the social withdrawal that she has been experiencing.  She also thinks moving into the spring time has been helpful as well.         No orders of the defined types were placed in this encounter.   Follow-up: Return in about 6 months (around 07/06/2020) for Hypertension.    Beatrice Lecher, MD

## 2020-01-05 NOTE — Assessment & Plan Note (Signed)
Overall she feels like the medication has been helpful we discussed options including keeping current regimen versus increasing dose for now she will stick with the 150 mg she can always reach out if she decides she would like to try higher dose.  She also feels like it is helped with some of the social withdrawal that she has been experiencing.  She also thinks moving into the spring time has been helpful as well.

## 2020-01-05 NOTE — Assessment & Plan Note (Signed)
BP looks fantastic today.  I am still not sure why it was elevated last time.  But she is feeling well.

## 2020-03-04 ENCOUNTER — Other Ambulatory Visit: Payer: Self-pay | Admitting: Family Medicine

## 2020-03-04 DIAGNOSIS — F411 Generalized anxiety disorder: Secondary | ICD-10-CM

## 2020-03-19 ENCOUNTER — Other Ambulatory Visit: Payer: Self-pay | Admitting: *Deleted

## 2020-03-19 DIAGNOSIS — F411 Generalized anxiety disorder: Secondary | ICD-10-CM

## 2020-03-19 MED ORDER — PAROXETINE HCL 30 MG PO TABS: 30.0000 mg | ORAL_TABLET | Freq: Every day | ORAL | 1 refills | Status: DC

## 2020-04-24 ENCOUNTER — Telehealth: Payer: 59 | Admitting: Nurse Practitioner

## 2020-04-24 DIAGNOSIS — N3000 Acute cystitis without hematuria: Secondary | ICD-10-CM | POA: Diagnosis not present

## 2020-04-24 MED ORDER — DOXYCYCLINE HYCLATE 100 MG PO TABS: 100.0000 mg | ORAL_TABLET | Freq: Two times a day (BID) | ORAL | 0 refills | Status: DC

## 2020-04-24 NOTE — Progress Notes (Signed)
We are sorry that you are not feeling well.  Here is how we plan to help!  Based on what you shared with me it looks like you most likely have a simple urinary tract infection.  A UTI (Urinary Tract Infection) is a bacterial infection of the bladder.  Most cases of urinary tract infections are simple to treat but a key part of your care is to encourage you to drink plenty of fluids and watch your symptoms carefully.  I have prescribed Doxycycline 100mg  1 po BID for 7 days  #14.  Your symptoms should gradually improve. Call us if the burning in your urine worsens, you develop worsening fever, back pain or pelvic pain or if your symptoms do not resolve after completing the antibiotic.  Urinary tract infections can be prevented by drinking plenty of water to keep your body hydrated.  Also be sure when you wipe, wipe from front to back and don't hold it in!  If possible, empty your bladder every 4 hours.  Your e-visit answers were reviewed by a board certified advanced clinical practitioner to complete your personal care plan.  Depending on the condition, your plan could have included both over the counter or prescription medications.  If there is a problem please reply  once you have received a response from your provider.  Your safety is important to [place].  If you have drug allergies check your prescription carefully.    You can use MyChart to ask questions about today's visit, request a non-urgent call back, or ask for a work or school excuse for 24 hours related to this e-Visit. If it has been greater than 24 hours you will need to follow up with your provider, or enter a new e-Visit to address those concerns.   You will get an e-mail in the next two days asking about your experience.  I hope that your e-visit has been valuable and will speed your recovery. Thank you for using e-visits.   5-10 minutes spent reviewing and documenting in chart.

## 2020-05-24 ENCOUNTER — Other Ambulatory Visit: Payer: Self-pay | Admitting: *Deleted

## 2020-05-24 DIAGNOSIS — R635 Abnormal weight gain: Secondary | ICD-10-CM

## 2020-05-24 DIAGNOSIS — I10 Essential (primary) hypertension: Secondary | ICD-10-CM

## 2020-05-24 DIAGNOSIS — E038 Other specified hypothyroidism: Secondary | ICD-10-CM

## 2020-05-24 MED ORDER — GABAPENTIN 300 MG PO CAPS: 600.0000 mg | ORAL_CAPSULE | Freq: Three times a day (TID) | ORAL | 3 refills | Status: DC

## 2020-05-26 ENCOUNTER — Other Ambulatory Visit: Payer: Self-pay | Admitting: Family Medicine

## 2020-05-29 ENCOUNTER — Other Ambulatory Visit: Payer: Self-pay | Admitting: Urology

## 2020-07-01 ENCOUNTER — Other Ambulatory Visit: Payer: Self-pay | Admitting: Family Medicine

## 2020-07-01 DIAGNOSIS — I10 Essential (primary) hypertension: Secondary | ICD-10-CM

## 2020-07-06 ENCOUNTER — Encounter: Payer: Self-pay | Admitting: Neurology

## 2020-07-08 ENCOUNTER — Encounter: Payer: Self-pay | Admitting: Family Medicine

## 2020-07-08 ENCOUNTER — Other Ambulatory Visit: Payer: Self-pay

## 2020-07-08 ENCOUNTER — Ambulatory Visit (INDEPENDENT_AMBULATORY_CARE_PROVIDER_SITE_OTHER): Payer: 59 | Admitting: Family Medicine

## 2020-07-08 VITALS — BP 128/81 | HR 69 | Ht 61.0 in | Wt 124.0 lb

## 2020-07-08 DIAGNOSIS — E038 Other specified hypothyroidism: Secondary | ICD-10-CM | POA: Diagnosis not present

## 2020-07-08 DIAGNOSIS — F411 Generalized anxiety disorder: Secondary | ICD-10-CM

## 2020-07-08 DIAGNOSIS — I10 Essential (primary) hypertension: Secondary | ICD-10-CM | POA: Diagnosis not present

## 2020-07-08 DIAGNOSIS — Z23 Encounter for immunization: Secondary | ICD-10-CM

## 2020-07-08 DIAGNOSIS — J4520 Mild intermittent asthma, uncomplicated: Secondary | ICD-10-CM | POA: Diagnosis not present

## 2020-07-08 DIAGNOSIS — F5101 Primary insomnia: Secondary | ICD-10-CM

## 2020-07-08 NOTE — Assessment & Plan Note (Signed)
Well on current regimen.  Plan to recheck TSH.  Taking medications regularly.

## 2020-07-08 NOTE — Assessment & Plan Note (Signed)
Doing well on current regimen of bupropion, Paxil, and trazodone for sleep.

## 2020-07-08 NOTE — Assessment & Plan Note (Signed)
Doing well, no recent flares or exacerbations.  Did discuss getting pneumonia vaccine.  We will go ahead and give shingles and flu today and then when I see her back we can update her pneumonia vaccine.

## 2020-07-08 NOTE — Progress Notes (Signed)
Established Patient Office Visit  Subjective:  Patient ID: Anna Ramsey, female    DOB: [birth year]  Age: 54 y.o. MRN: [id number]  CC:  Chief Complaint  Patient presents with  . Hypertension    HPI Anna Ramsey presents for   Hypertension- Pt denies chest pain, SOB, dizziness, or heart palpitations.  Taking meds as directed w/o problems.  Denies medication side effects.     F/U GAD -doing well overall.  Feels like she is happy with her current regimen with the Paxil and Wellbutrin.  Still using trazodone for sleep as needed.  Hypothyroidism - Taking medication regularly in the AM away from food and vitamins, etc. No recent change to skin, hair, or energy levels.  Asthma-doing well overall.  Has not had a pneumonia vaccine.  No recent flares or exacerbations.  She still is feeling a little bit discouraged as her neuropathy has been progressing over the last 3 years they have not really been able to find a definitive diagnosis.  In fact she is going to get a second opinion in December with Dr. Tomi Likens.  Past Medical History:  Diagnosis Date  . Dysuria   . Erythematous bladder mucosa   . Heart murmur    asymptomatic per pt --  no echo  . Hematuria   . History of cervical cancer    12/ 2014  Stage IB2--  s/p  TAH w/ BSO and pelvic lymphadectomy/  and radiation therapy  . History of radiation therapy 10/20/13-11/28/13   pelvis 50.4 gray  . Hypertension   . Lesion of bladder   . Mild intermittent asthma   . Neuromuscular disorder ([place])   . Seasonal allergies   . Thyroid disease     Past Surgical History:  Procedure Laterality Date  . CYSTOSCOPY WITH BIOPSY N/A 04/29/2015   Procedure: CYSTOSCOPY WITH BIOPSY WITH FULGERATION;  Surgeon: [name], MD;  [facility];  Service: Urology;  Laterality: N/A;  1 HR [phone] [id number]  . FOOT SURGERY Left 2010  . RADICAL ABDOMINAL HYSTERECTOMY  09-05-2013   w/ BILATERAL  SALPINGOOPHORECTOMY AND PELVIC LYMPHADECTOMY    Family History  Problem Relation Age of Onset  . Heart attack Father 62  . Diabetes Father   . Hypertension Maternal Grandmother   . Stroke Maternal Grandmother   . Cancer Mother        breast/thyroid    Social History   Socioeconomic History  . Marital status: Married    Spouse name: Not on file  . Number of children: 1  . Years of education: Not on file  . Highest education level: Not on file  Occupational History    Employer: [employer]  Tobacco Use  . Smoking status: Former Smoker    Packs/day: 0.50    Years: 25.00    Pack years: 12.50    Types: E-cigarettes, Cigarettes    Quit date: 09/25/2012    Years since quitting: 7.7  . Smokeless tobacco: Never Used  . Tobacco comment: has been using e-cig for past 2 yrs  Substance and Sexual Activity  . Alcohol use: Yes    Alcohol/week: 1.0 standard drink    Types: 1 Standard drinks or equivalent per week    Comment: occasional  . Drug use: No  . Sexual activity: Not on file  Other Topics Concern  . Not on file  Social History Narrative  . Not on file   Social Determinants of Health   Financial Resource Strain:   .  Difficulty of Paying Living Expenses: Not on file  Food Insecurity:   . Worried About Charity fundraiser in the Last Year: Not on file  . Ran Out of Food in the Last Year: Not on file  Transportation Needs:   . Lack of Transportation (Medical): Not on file  . Lack of Transportation (Non-Medical): Not on file  Physical Activity:   . Days of Exercise per Week: Not on file  . Minutes of Exercise per Session: Not on file  Stress:   . Feeling of Stress : Not on file  Social Connections:   . Frequency of Communication with Friends and Family: Not on file  . Frequency of Social Gatherings with Friends and Family: Not on file  . Attends Religious Services: Not on file  . Active Member of Clubs or Organizations: Not on file  . Attends Archivist Meetings:  Not on file  . Marital Status: Not on file  Intimate Partner Violence:   . Fear of Current or Ex-Partner: Not on file  . Emotionally Abused: Not on file  . Physically Abused: Not on file  . Sexually Abused: Not on file    Outpatient Medications Prior to Visit  Medication Sig Dispense Refill  . albuterol (VENTOLIN HFA) 108 (90 Base) MCG/ACT inhaler Inhale 1-2 puffs into the lungs every 6 (six) hours as needed for wheezing or shortness of breath. 36 g 2  . budesonide-formoterol (SYMBICORT) 80-4.5 MCG/ACT inhaler Inhale 2 puffs into the lungs 2 (two) times daily. (Patient taking differently: Inhale 1 puff into the lungs daily. ) 1 Inhaler 5  . buPROPion (WELLBUTRIN XL) 150 MG 24 hr tablet Take 1 tablet (150 mg total) by mouth every morning. 90 tablet 3  . cetirizine (ZYRTEC) 10 MG tablet Take 10 mg by mouth at bedtime.      . fluticasone (FLONASE) 50 MCG/ACT nasal spray Place 2 sprays into both nostrils daily as needed.     [name] levothyroxine (SYNTHROID) 88 MCG tablet TAKE 1 TABLET DAILY 90 tablet 3  . metoprolol tartrate (LOPRESSOR) 100 MG tablet TAKE 1 TABLET TWICE A DAY 180 tablet 3  . PARoxetine (PAXIL) 30 MG tablet Take 1 tablet (30 mg total) by mouth daily. 90 tablet 1  . pregabalin (LYRICA) 100 MG capsule Take 100 mg by mouth 3 (three) times daily.    [name] tolterodine (DETROL LA) 4 MG 24 hr capsule TAKE 1 CAPSULE DAILY 90 capsule 3  . traZODone (DESYREL) 50 MG tablet Take 1 tablet (50 mg total) by mouth at bedtime. 90 tablet 1  . doxycycline (VIBRA-TABS) 100 MG tablet Take 1 tablet (100 mg total) by mouth 2 (two) times daily. 1 po bid 14 tablet 0  . gabapentin (NEURONTIN) 300 MG capsule Take 2 capsules (600 mg total) by mouth 3 (three) times daily. 540 capsule 3   No facility-administered medications prior to visit.    Allergies  Allergen Reactions  . Cefdinir   . Penicillins Other (See Comments)    Unknown childhood reaction  . Sulfa Antibiotics Other (See Comments)    Numbness in  lower extremities from knees down    ROS Review of Systems    Objective:    Physical Exam Constitutional:      Appearance: She is well-developed.  HENT:     Head: Normocephalic and atraumatic.  Cardiovascular:     Rate and Rhythm: Normal rate and regular rhythm.     Heart sounds: Normal heart sounds.  Pulmonary:  Effort: Pulmonary effort is normal.     Breath sounds: Normal breath sounds.  Skin:    General: Skin is warm and dry.  Neurological:     Mental Status: She is alert and oriented to person, place, and time.  Psychiatric:        Behavior: Behavior normal.     BP 128/81   Pulse 69   Ht 5\' 1"  (1.549 m)   Wt 124 lb (56.2 kg)   LMP 08/19/2013   SpO2 98%   BMI 23.43 kg/m  Wt Readings from Last 3 Encounters:  07/08/20 124 lb (56.2 kg)  01/05/20 122 lb (55.3 kg)  10/28/19 124 lb (56.2 kg)     Health Maintenance Due  Topic Date Due  . Hepatitis C Screening  Never done  . MAMMOGRAM  03/20/2020    There are no preventive care reminders to display for this patient.  Lab Results  Component Value Date   TSH 0.86 10/28/2019   Lab Results  Component Value Date   WBC 3.5 (L) 05/11/2016   HGB 15.0 05/11/2016   HCT 44.6 05/11/2016   MCV 96.5 05/11/2016   PLT 249 05/11/2016   Lab Results  Component Value Date   NA 141 10/28/2019   K 4.8 10/28/2019   CO2 26 10/28/2019   GLUCOSE 97 10/28/2019   BUN 10 10/28/2019   CREATININE 0.74 10/28/2019   BILITOT 0.6 10/28/2019   ALKPHOS 106 05/11/2016   AST 22 10/28/2019   ALT 18 10/28/2019   PROT 7.0 10/28/2019   ALBUMIN 4.3 05/11/2016   CALCIUM 9.7 10/28/2019   Lab Results  Component Value Date   CHOL 195 10/28/2019   Lab Results  Component Value Date   HDL 59 10/28/2019   Lab Results  Component Value Date   LDLCALC 115 (H) 10/28/2019   Lab Results  Component Value Date   TRIG 104 10/28/2019   Lab Results  Component Value Date   CHOLHDL 3.3 10/28/2019   Lab Results  Component Value Date    HGBA1C 5.4 06/28/2017      Assessment & Plan:   Problem List Items Addressed This Visit      Cardiovascular and Mediastinum   HYPERTENSION, BENIGN - Primary    Well controlled. Continue current regimen. Follow up in  6 months.       Relevant Orders   BASIC METABOLIC PANEL WITH GFR     Respiratory   Asthma, mild intermittent    Doing well, no recent flares or exacerbations.  Did discuss getting pneumonia vaccine.  We will go ahead and give shingles and flu today and then when I see her back we can update her pneumonia vaccine.        Endocrine   Hypothyroid    Well on current regimen.  Plan to recheck TSH.  Taking medications regularly.      Relevant Orders   TSH     Other   INSOMNIA    Doing well on trazodone. Due for refill.       GENERALIZED ANXIETY DISORDER    Doing well on current regimen of bupropion, Paxil, and trazodone for sleep.       Other Visit Diagnoses    Need for Zostavax administration       Relevant Orders   Varicella-zoster vaccine IM (Shingrix) (Completed)   Need for immunization against influenza       Relevant Orders   Flu Vaccine QUAD 36+ mos IM (Completed)  No orders of the defined types were placed in this encounter.   Follow-up: Return in about 6 months (around 01/06/2021) for Hypertension, Thyroid and 2nd singles vaccine.  Beatrice Lecher, MD

## 2020-07-08 NOTE — Assessment & Plan Note (Signed)
Doing well on trazodone. Due for refill.

## 2020-07-08 NOTE — Assessment & Plan Note (Signed)
Well controlled. Continue current regimen. Follow up in  6 months.  

## 2020-07-09 LAB — BASIC METABOLIC PANEL WITH GFR
BUN: 10 mg/dL (ref 7–25)
CO2: 27 mmol/L (ref 20–32)
Calcium: 9.6 mg/dL (ref 8.6–10.4)
Chloride: 107 mmol/L (ref 98–110)
Creat: 0.73 mg/dL (ref 0.50–1.05)
GFR, Est African American: 111 mL/min/{1.73_m2} (ref >=60)
GFR, Est Non African American: 95 mL/min/{1.73_m2} (ref >=60)
Glucose, Bld: 100 mg/dL — ABNORMAL HIGH (ref 65–99)
Potassium: 4.5 mmol/L (ref 3.5–5.3)
Sodium: 142 mmol/L (ref 135–146)

## 2020-07-09 LAB — TSH: TSH: 1.49 mIU/L

## 2020-07-09 NOTE — Progress Notes (Signed)
All labs are normal. 

## 2020-07-22 ENCOUNTER — Other Ambulatory Visit: Payer: Self-pay | Admitting: Family Medicine

## 2020-07-22 DIAGNOSIS — F5101 Primary insomnia: Secondary | ICD-10-CM

## 2020-08-24 LAB — HM MAMMOGRAPHY

## 2020-08-27 ENCOUNTER — Other Ambulatory Visit: Payer: Self-pay | Admitting: *Deleted

## 2020-08-27 ENCOUNTER — Encounter: Payer: Self-pay | Admitting: Family Medicine

## 2020-08-27 MED ORDER — PREGABALIN 100 MG PO CAPS
100.0000 mg | ORAL_CAPSULE | Freq: Three times a day (TID) | ORAL | 0 refills | Status: DC
Start: 2020-08-27 — End: 2021-06-30

## 2020-08-27 NOTE — Telephone Encounter (Signed)
Pt sent request for short supply of lyrica 100 mg  to be sent to [facility] for 1 week.

## 2020-09-01 NOTE — Progress Notes (Deleted)
NEUROLOGY CONSULTATION NOTE  JENNIER Ramsey MRN: [id number] DOB: 31-Jul-1966  Referring provider: Kary Kos, MD Primary care provider: Beatrice Lecher, MD  Reason for consult:  Balance disorder   Subjective:  Anna Ramsey. Anna Ramsey is a 54 year old ***-handed female with thyroid disease, HTN, and history of cervical cancer who presents for balance disorder.  History supplemented by *** referring provider's notes.  ***  She saw a neurologist in 2017.  MRI of lumbar spine without contrast on 08/01/2017 showed levoscoliosis without significant disc herniation or spinal/foraminal stenosis.  ***  She had an MRI of the brain without contrast on 01/05/2020 personally reviewed which was normal.  She had a repeat MRI of lumbar spine without contrast the same day, personally reviewed, which also was unremarkable.       PAST MEDICAL HISTORY: Past Medical History:  Diagnosis Date  . Dysuria   . Erythematous bladder mucosa   . Heart murmur    asymptomatic per pt --  no echo  . Hematuria   . History of cervical cancer    12/ 2014  Stage IB2--  s/p  TAH w/ BSO and pelvic lymphadectomy/  and radiation therapy  . History of radiation therapy 10/20/13-11/28/13   pelvis 50.4 gray  . Hypertension   . Lesion of bladder   . Mild intermittent asthma   . Neuromuscular disorder ([place])   . Seasonal allergies   . Thyroid disease     PAST SURGICAL HISTORY: Past Surgical History:  Procedure Laterality Date  . CYSTOSCOPY WITH BIOPSY N/A 04/29/2015   Procedure: CYSTOSCOPY WITH BIOPSY WITH FULGERATION;  Surgeon: [name], MD;  Location: [facility];  Service: Urology;  Laterality: N/A;  1 HR [phone] [id number]  . FOOT SURGERY Left 2010  . RADICAL ABDOMINAL HYSTERECTOMY  09-05-2013   w/ BILATERAL SALPINGOOPHORECTOMY AND PELVIC LYMPHADECTOMY    MEDICATIONS: Current Outpatient Medications on File Prior to Visit  Medication Sig Dispense Refill  . albuterol  (VENTOLIN HFA) 108 (90 Base) MCG/ACT inhaler Inhale 1-2 puffs into the lungs every 6 (six) hours as needed for wheezing or shortness of breath. 36 g 2  . budesonide-formoterol (SYMBICORT) 80-4.5 MCG/ACT inhaler Inhale 2 puffs into the lungs 2 (two) times daily. (Patient taking differently: Inhale 1 puff into the lungs daily. ) 1 Inhaler 5  . buPROPion (WELLBUTRIN XL) 150 MG 24 hr tablet Take 1 tablet (150 mg total) by mouth every morning. 90 tablet 3  . cetirizine (ZYRTEC) 10 MG tablet Take 10 mg by mouth at bedtime.      . fluticasone (FLONASE) 50 MCG/ACT nasal spray Place 2 sprays into both nostrils daily as needed.     [name] levothyroxine (SYNTHROID) 88 MCG tablet TAKE 1 TABLET DAILY 90 tablet 3  . metoprolol tartrate (LOPRESSOR) 100 MG tablet TAKE 1 TABLET TWICE A DAY 180 tablet 3  . PARoxetine (PAXIL) 30 MG tablet Take 1 tablet (30 mg total) by mouth daily. 90 tablet 1  . pregabalin (LYRICA) 100 MG capsule Take 1 capsule (100 mg total) by mouth 3 (three) times daily. 45 capsule 0  . tolterodine (DETROL LA) 4 MG 24 hr capsule TAKE 1 CAPSULE DAILY 90 capsule 3  . traZODone (DESYREL) 50 MG tablet TAKE 1 TABLET AT BEDTIME 90 tablet 3   No current facility-administered medications on file prior to visit.    ALLERGIES: Allergies  Allergen Reactions  . Cefdinir   . Penicillins Other (See Comments)    Unknown childhood reaction  .  Sulfa Antibiotics Other (See Comments)    Numbness in lower extremities from knees down    FAMILY HISTORY: Family History  Problem Relation Age of Onset  . Heart attack Father 55  . Diabetes Father   . Hypertension Maternal Grandmother   . Stroke Maternal Grandmother   . Cancer Mother        breast/thyroid   ***.  SOCIAL HISTORY: Social History   Socioeconomic History  . Marital status: Married    Spouse name: Not on file  . Number of children: 1  . Years of education: Not on file  . Highest education level: Not on file  Occupational History     Employer: [employer]  Tobacco Use  . Smoking status: Former Smoker    Packs/day: 0.50    Years: 25.00    Pack years: 12.50    Types: E-cigarettes, Cigarettes    Quit date: 09/25/2012    Years since quitting: 7.9  . Smokeless tobacco: Never Used  . Tobacco comment: has been using e-cig for past 2 yrs  Substance and Sexual Activity  . Alcohol use: Yes    Alcohol/week: 1.0 standard drink    Types: 1 Standard drinks or equivalent per week    Comment: occasional  . Drug use: No  . Sexual activity: Not on file  Other Topics Concern  . Not on file  Social History Narrative  . Not on file   Social Determinants of Health   Financial Resource Strain:   . Difficulty of Paying Living Expenses: Not on file  Food Insecurity:   . Worried About Charity fundraiser in the Last Year: Not on file  . Ran Out of Food in the Last Year: Not on file  Transportation Needs:   . Lack of Transportation (Medical): Not on file  . Lack of Transportation (Non-Medical): Not on file  Physical Activity:   . Days of Exercise per Week: Not on file  . Minutes of Exercise per Session: Not on file  Stress:   . Feeling of Stress : Not on file  Social Connections:   . Frequency of Communication with Friends and Family: Not on file  . Frequency of Social Gatherings with Friends and Family: Not on file  . Attends Religious Services: Not on file  . Active Member of Clubs or Organizations: Not on file  . Attends Archivist Meetings: Not on file  . Marital Status: Not on file  Intimate Partner Violence:   . Fear of Current or Ex-Partner: Not on file  . Emotionally Abused: Not on file  . Physically Abused: Not on file  . Sexually Abused: Not on file    Objective:  *** General: No acute distress.  Patient appears well-groomed.   Head:  Normocephalic/atraumatic Eyes:  fundi examined but not visualized Neck: supple, no paraspinal tenderness, full range of motion Back: No paraspinal tenderness Heart: regular  rate and rhythm Lungs: Clear to auscultation bilaterally. Vascular: No carotid bruits. Neurological Exam: Mental status: alert and oriented to person, place, and time, recent and remote memory intact, fund of knowledge intact, attention and concentration intact, speech fluent and not dysarthric, language intact. Cranial nerves: CN I: not tested CN II: pupils equal, round and reactive to light, visual fields intact CN III, IV, VI:  full range of motion, no nystagmus, no ptosis CN V: facial sensation intact. CN VII: upper and lower face symmetric CN VIII: hearing intact CN IX, X: gag intact, uvula midline CN XI: sternocleidomastoid  and trapezius muscles intact CN XII: tongue midline Bulk & Tone: normal, no fasciculations. Motor:  muscle strength 5/5 throughout Sensation:  Pinprick, temperature and vibratory sensation intact. Deep Tendon Reflexes:  2+ throughout,  toes downgoing.   Finger to nose testing:  Without dysmetria.   Heel to shin:  Without dysmetria.   Gait:  Normal station and stride.  Romberg negative.  Assessment/Plan:   ***    Thank you for allowing me to take part in the care of this patient.  Metta Clines, DO  CC: Beatrice Lecher, MD

## 2020-09-02 ENCOUNTER — Ambulatory Visit: Payer: 59 | Admitting: Neurology

## 2020-11-12 NOTE — Progress Notes (Signed)
NEUROLOGY CONSULTATION NOTE  BEMNET TROVATO MRN: [id number] DOB: 22-Aug-1966  Referring provider: Beatrice Lecher, MD Primary care provider: Beatrice Lecher, MD  Reason for consult:  Balance disorder   Subjective:  Anna Ramsey. Anna Ramsey is a 55 year old female with HTN, thyroid disease, and history of cervical cancer who presents for balance disorder.  She is accompanied by her husband who supplements history.  History also is supplemented by PCP and neurosurgery notes.  Patient has history of cervical cancer status post hysterectomy and pelvic radiation in 2015.  Around 2018, she sprained her right ankle.  She began noticing tingling and numbness on the bottom of her right foot that spread up her leg and then involved her left leg.  The numbness typically radiated up to her knees but sometimes it extends to the waist.  Her legs feel like cement and struggles to lift her legs.  Sometimes her right leg may give out.  History states chronic low back pain with radiculopathy but she denies back pain or radicular pain in the legs.  She has undergone epidural injections in the low back which has not helped the numbness.  She denies neuropathic pain such as burning, stabbing or shocks .  She also had some cognitive changes, finding it more difficulty to multitask.  She was found to have hypothyroidism which has since been treated.  Other labs checked included B12 540, folate 6.2, B1 8, ferritin 128, Mg 2.1 and mildly elevated B6 of 29.2.  MRI of lumbar spine on 08/01/2017 showed levoscoliosis without any significant disc herniation, spinal or neuroforaminal stenosis.  She had another MRI of lumbar spine on 01/05/2020 which was also negative for any explanation.  MRI of cervical and thoracic spine in August 2021 showed right sided foraminal stenosis at C6-7 but otherwise unremarkable.  She started experiencing numbness and tingling in the mouth  MRI of brain without contrast on 01/05/2020 was  personally reviewed and was normal.  She has been evaluated by neurology in [facility].  She underwent a NCV-EMG that reportedly showed mild to moderate peripheral neuropathy.  She ambulates with a cane and feels that she is getting weaker.  She has previously on gabapentin but now takes Lyrica.  However, she says the Lyrica isn't helpful with the numbness and heaviness in her legs.   PAST MEDICAL HISTORY: Past Medical History:  Diagnosis Date  . Dysuria   . Erythematous bladder mucosa   . Heart murmur    asymptomatic per pt --  no echo  . Hematuria   . History of cervical cancer    12/ 2014  Stage IB2--  s/p  TAH w/ BSO and pelvic lymphadectomy/  and radiation therapy  . History of radiation therapy 10/20/13-11/28/13   pelvis 50.4 gray  . Hypertension   . Lesion of bladder   . Mild intermittent asthma   . Neuromuscular disorder ([place])   . Seasonal allergies   . Thyroid disease     PAST SURGICAL HISTORY: Past Surgical History:  Procedure Laterality Date  . CYSTOSCOPY WITH BIOPSY N/A 04/29/2015   Procedure: CYSTOSCOPY WITH BIOPSY WITH FULGERATION;  Surgeon: [name], MD;  Location: [facility];  Service: Urology;  Laterality: N/A;  1 HR [phone] [id number]  . FOOT SURGERY Left 2010  . RADICAL ABDOMINAL HYSTERECTOMY  09-05-2013   w/ BILATERAL SALPINGOOPHORECTOMY AND PELVIC LYMPHADECTOMY    MEDICATIONS: Current Outpatient Medications on File Prior to Visit  Medication Sig Dispense Refill  . albuterol (  VENTOLIN HFA) 108 (90 Base) MCG/ACT inhaler Inhale 1-2 puffs into the lungs every 6 (six) hours as needed for wheezing or shortness of breath. 36 g 2  . budesonide-formoterol (SYMBICORT) 80-4.5 MCG/ACT inhaler Inhale 2 puffs into the lungs 2 (two) times daily. (Patient taking differently: Inhale 1 puff into the lungs daily. ) 1 Inhaler 5  . buPROPion (WELLBUTRIN XL) 150 MG 24 hr tablet Take 1 tablet (150 mg total) by mouth every morning. 90 tablet 3  .  cetirizine (ZYRTEC) 10 MG tablet Take 10 mg by mouth at bedtime.      . fluticasone (FLONASE) 50 MCG/ACT nasal spray Place 2 sprays into both nostrils daily as needed.     [name] levothyroxine (SYNTHROID) 88 MCG tablet TAKE 1 TABLET DAILY 90 tablet 3  . metoprolol tartrate (LOPRESSOR) 100 MG tablet TAKE 1 TABLET TWICE A DAY 180 tablet 3  . PARoxetine (PAXIL) 30 MG tablet Take 1 tablet (30 mg total) by mouth daily. 90 tablet 1  . pregabalin (LYRICA) 100 MG capsule Take 1 capsule (100 mg total) by mouth 3 (three) times daily. 45 capsule 0  . tolterodine (DETROL LA) 4 MG 24 hr capsule TAKE 1 CAPSULE DAILY 90 capsule 3  . traZODone (DESYREL) 50 MG tablet TAKE 1 TABLET AT BEDTIME 90 tablet 3   No current facility-administered medications on file prior to visit.    ALLERGIES: Allergies  Allergen Reactions  . Cefdinir   . Penicillins Other (See Comments)    Unknown childhood reaction  . Sulfa Antibiotics Other (See Comments)    Numbness in lower extremities from knees down    FAMILY HISTORY: Family History  Problem Relation Age of Onset  . Heart attack Father 2  . Diabetes Father   . Hypertension Maternal Grandmother   . Stroke Maternal Grandmother   . Cancer Mother        breast/thyroid    SOCIAL HISTORY: Social History   Socioeconomic History  . Marital status: Married    Spouse name: Not on file  . Number of children: 1  . Years of education: Not on file  . Highest education level: Not on file  Occupational History    Employer: [employer]  Tobacco Use  . Smoking status: Former Smoker    Packs/day: 0.50    Years: 25.00    Pack years: 12.50    Types: E-cigarettes, Cigarettes    Quit date: 09/25/2012    Years since quitting: 8.1  . Smokeless tobacco: Never Used  . Tobacco comment: has been using e-cig for past 2 yrs  Substance and Sexual Activity  . Alcohol use: Yes    Alcohol/week: 1.0 standard drink    Types: 1 Standard drinks or equivalent per week    Comment: occasional  .  Drug use: No  . Sexual activity: Not on file  Other Topics Concern  . Not on file  Social History Narrative  . Not on file   Social Determinants of Health   Financial Resource Strain: Not on file  Food Insecurity: Not on file  Transportation Needs: Not on file  Physical Activity: Not on file  Stress: Not on file  Social Connections: Not on file  Intimate Partner Violence: Not on file    Objective:  Blood pressure 138/86, pulse 70, height 5\' 1"  (1.549 m), weight 127 lb (57.6 kg), last menstrual period 08/19/2013, SpO2 99 %. General: No acute distress.  Patient appears well-groomed.   Head:  Normocephalic/atraumatic Eyes:  fundi examined but  not visualized Neck: supple, no paraspinal tenderness, full range of motion Back: No paraspinal tenderness Heart: regular rate and rhythm Lungs: Clear to auscultation bilaterally. Vascular: No carotid bruits. Neurological Exam: Mental status: alert and oriented to person, place, and time, recent and remote memory intact, fund of knowledge intact, attention and concentration intact, speech fluent and not dysarthric, language intact. Cranial nerves: CN I: not tested CN II: pupils equal, round and reactive to light, visual fields intact CN III, IV, VI:  full range of motion, no nystagmus, no ptosis CN V: reports altered facial sensation but cannot tell me which side actually feels different. CN VII: upper and lower face symmetric CN VIII: hearing reduced on right CN IX, X: gag intact, uvula midline CN XI: sternocleidomastoid and trapezius muscles intact CN XII: tongue midline Bulk & Tone: normal, no fasciculations. Motor:  muscle strength 4/5 lower extremities.  5/5 upper extremities. Sensation:  Pinprick and vibratory sensation reduced in right upper extremity as well as bilateral feet up to knees (right greater than left) Deep Tendon Reflexes:  2+ upper extremities, 1+ lower extremities,  toes downgoing.   Finger to nose testing:  Without  dysmetria.   Heel to shin:  Difficult to lift her legs Gait:  Wide-based wobbling gait.  Able to turn. Unable to tandem walk.  Romberg with sway.  Assessment/Plan:   Lower extremity weakness and numbness.  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  MRI of neuro-axis unremarkable.    1.  Will repeat NCV-EMG of lower extremities. 2.  Will obtain records from prior neurologist to see completed workup and determine if further testing warranted. 3.  As she reports symptoms to be only numbness and denies every having pain in the lower extremities, I will have her discontinue Lyrica as it is indicated only for neuropathic pain and would not be effective in reducing numbness. 4.  Follow up after testing.  Thank you for allowing me to take part in the care of this patient.  Metta Clines, DO  CC:  Beatrice Lecher, MD

## 2020-11-15 ENCOUNTER — Ambulatory Visit (INDEPENDENT_AMBULATORY_CARE_PROVIDER_SITE_OTHER): Payer: 59 | Admitting: Neurology

## 2020-11-15 ENCOUNTER — Other Ambulatory Visit: Payer: Self-pay

## 2020-11-15 ENCOUNTER — Encounter: Payer: Self-pay | Admitting: Neurology

## 2020-11-15 VITALS — BP 138/86 | HR 70 | Ht 61.0 in | Wt 127.0 lb

## 2020-11-15 DIAGNOSIS — G5793 Unspecified mononeuropathy of bilateral lower limbs: Secondary | ICD-10-CM

## 2020-11-15 NOTE — Patient Instructions (Signed)
1.  Will check nerve study of lower extremities 2.  Will obtain notes from your other neurologist to see if any other tests need to be checked 3.  For lyrica- take 1 pill twice daily for a week, then 1 pill at bedtime for a week, then STOP 4.  Follow up after testing.

## 2020-11-17 ENCOUNTER — Other Ambulatory Visit: Payer: Self-pay | Admitting: Family Medicine

## 2020-11-17 DIAGNOSIS — F411 Generalized anxiety disorder: Secondary | ICD-10-CM

## 2020-11-29 ENCOUNTER — Other Ambulatory Visit: Payer: Self-pay | Admitting: Family Medicine

## 2020-11-29 DIAGNOSIS — R41840 Attention and concentration deficit: Secondary | ICD-10-CM

## 2020-12-29 ENCOUNTER — Other Ambulatory Visit: Payer: Self-pay

## 2020-12-29 ENCOUNTER — Ambulatory Visit (INDEPENDENT_AMBULATORY_CARE_PROVIDER_SITE_OTHER): Payer: 59 | Admitting: Neurology

## 2020-12-29 DIAGNOSIS — G5793 Unspecified mononeuropathy of bilateral lower limbs: Secondary | ICD-10-CM

## 2020-12-29 DIAGNOSIS — M5417 Radiculopathy, lumbosacral region: Secondary | ICD-10-CM

## 2020-12-29 NOTE — Procedures (Signed)
[facility] Neu[facility]  [address], [address]  [place], [place] [address] Tel: [phone] Fax:  [phone] Test Date:  12/29/2020  Patient: Anna Ramsey DOB: 05/02/1966 Physician: Narda Amber, DO  Sex: Female Height: 5\' 1"  Ref Phys: Metta Clines, D.O.  ID#: [id number]   Technician:    Patient Complaints: This is a 55 year old female referred for evaluation of bilateral leg paresthesias and right leg weakness.  NCV & EMG Findings: Extensive electrodiagnostic testing of the right lower extremity and additional studies of the left shows:  1. Bilateral sural and superficial peroneal sensory responses are within normal limits. 2. Peroneal motor responses at the extensor digitorum brevis is reduced on the right (1.8 mV) and absent on the left.  Bilateral peroneal motor responses at the tibialis anterior are within normal limits.  Bilateral tibial motor responses are within normal limits.   3. Bilateral tibial H reflex studies are within normal limits.   4. 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  Additionally, no motor unit recruitment was seen in the adductor longus muscles.    Impression: 1. The electrophysiologic findings are most suggestive of a L3-4 radiculopathy affecting bilateral lower extremities, worse on the right. 2. There is no evidence of a sensorimotor polyneuropathy affecting the lower extremities.   ___________________________ Narda Amber, DO    Nerve Conduction Studies Anti Sensory Summary Table   Stim Site NR Peak (ms) Norm Peak (ms) P-T Amp (V) Norm P-T Amp  Left Sup Peroneal Anti Sensory (Ant Lat Mall)  12 cm    2.1 <4.6 7.9 >4  Right Sup Peroneal Anti Sensory (Ant Lat Mall)  12 cm    3.0 <4.6 6.3 >4  Left Sural Anti Sensory (Lat Mall)  Calf    4.1 <4.6 10.0 >4  Right Sural Anti Sensory (Lat Mall)  Calf    3.4 <4.6 9.6 >4   Motor Summary Table    Stim Site NR Onset (ms) Norm Onset (ms) O-P Amp (mV) Norm O-P Amp Site1 Site2 Delta-0 (ms) Dist (cm) Vel (m/s) Norm Vel (m/s)  Left Peroneal Motor (Ext Dig Brev)  Ankle NR  <6.0  >2.5 B Fib Ankle  0.0  >40  B Fib NR     Poplt B Fib  0.0  >40  Poplt NR            Right Peroneal Motor (Ext Dig Brev)  Ankle    5.6 <6.0 1.8 >2.5 B Fib Ankle 8.0 32.0 40 >40  B Fib    13.6  1.6  Poplt B Fib 1.7 8.0 47 >40  Poplt    15.3  1.7         Left Peroneal TA Motor (Tib Ant)  Fib Head    3.4 <4.5 5.7 >3 Poplit Fib Head 1.4 8.0 57 >40  Poplit    4.8  5.5         Right Peroneal TA Motor (Tib Ant)  Fib Head    3.0 <4.5 4.4 >3 Poplit Fib Head 1.3 8.0 62 >40  Poplit    4.3  4.4         Left Tibial Motor ([place]d Hall Brev)  Ankle    5.9 <6.0 11.0 >4 Knee Ankle 7.8 36.0 46 >40  Knee    13.7  10.9         Right Tibial Motor (Abd Hall Brev)  Ankle    5.2 <6.0  7.7 >4 Knee Ankle 9.3 37.0 40 >40  Knee    14.5  7.2          H Reflex Studies   NR H-Lat (ms) Lat Norm (ms) L-R H-Lat (ms)  Left Tibial (Gastroc)     34.01 <35 0.00  Right Tibial (Gastroc)     34.01 <35 0.00   EMG   Side Muscle Ins Act Fibs Psw Fasc Number Recrt Dur Dur. Amp Amp. Poly Poly. Comment  Right AntTibialis Nml Nml Nml Nml 2- Rapid Many 2+ Many 2+ Many 2+ N/A  Right Gastroc Nml Nml Nml Nml Nml Nml Nml Nml Nml Nml Nml Nml N/A  Right Flex Dig Long Nml Nml Nml Nml Nml Nml Nml Nml Nml Nml Nml Nml N/A  Right RectFemoris Nml Nml Nml Nml 2- Rapid Many 1+ Many 1+ Many 1+ N/A  Right GluteusMed Nml Nml Nml Nml Nml Nml Nml Nml Nml Nml Nml Nml N/A  Right AdductorLong Nml Nml Nml Nml NE None - - - - - - N/A  Right BicepsFemS Nml Nml Nml Nml Nml Nml Nml Nml Nml Nml Nml Nml N/A  Left Gastroc Nml Nml Nml Nml Nml Nml Nml Nml Nml Nml Nml Nml N/A  Left Flex Dig Long Nml Nml Nml Nml Nml Nml Nml Nml Nml Nml Nml Nml N/A  Left RectFemoris Nml Nml Nml Nml 1- Rapid Some 1+ Some 1+ Some 1+ N/A  Left GluteusMed Nml Nml Nml Nml Nml Nml Nml Nml Nml Nml Nml Nml N/A   Left BicepsFemS Nml Nml Nml Nml Nml Nml Nml Nml Nml Nml Nml Nml N/A  Left AntTibialis Nml Nml Nml Nml 2- Rapid Some 1+ Some 1+ Some 1+ N/A      Waveforms:

## 2020-12-30 ENCOUNTER — Telehealth: Payer: Self-pay | Admitting: Neurology

## 2020-12-30 DIAGNOSIS — G619 Inflammatory polyneuropathy, unspecified: Secondary | ICD-10-CM

## 2020-12-30 NOTE — Telephone Encounter (Signed)
Inflammatory neuropathy

## 2020-12-30 NOTE — Addendum Note (Signed)
Addended by: Venetia Night on: 12/30/2020 03:07 PM   Modules accepted: Orders

## 2020-12-30 NOTE — Addendum Note (Signed)
Addended by: Venetia Night on: 12/30/2020 02:10 PM   Modules accepted: Orders

## 2020-12-30 NOTE — Telephone Encounter (Signed)
I called and discussed NCV-EMG results with patient.  We discussed next step would be lumbar puncture.  She is agreeable.  Check CSF cell count, protein, glucose, cytology, flow cytometry, oligoclonal bands, IgG index, ACE, Lyme, gram stain and culture.

## 2021-01-03 ENCOUNTER — Ambulatory Visit
Admission: RE | Admit: 2021-01-03 | Discharge: 2021-01-03 | Disposition: A | Discharge status: Home / Self Care | Payer: 59 | Source: Ambulatory Visit | Attending: Neurology | Admitting: Neurology

## 2021-01-03 ENCOUNTER — Other Ambulatory Visit (HOSPITAL_COMMUNITY)
Admission: RE | Admit: 2021-01-03 | Discharge: 2021-01-03 | Disposition: A | Discharge status: Home / Self Care | Payer: 59 | Source: Ambulatory Visit | Attending: Family Medicine | Admitting: Family Medicine

## 2021-01-03 ENCOUNTER — Other Ambulatory Visit: Payer: Self-pay

## 2021-01-03 VITALS — BP 171/99 | HR 62

## 2021-01-03 DIAGNOSIS — G619 Inflammatory polyneuropathy, unspecified: Secondary | ICD-10-CM | POA: Diagnosis present

## 2021-01-03 DIAGNOSIS — G5793 Unspecified mononeuropathy of bilateral lower limbs: Secondary | ICD-10-CM

## 2021-01-03 MED ORDER — DIAZEPAM 5 MG PO TABS
5.0000 mg | ORAL_TABLET | Freq: Once | ORAL | Status: AC
  Administered 2021-01-03: 5 mg via ORAL

## 2021-01-03 NOTE — Progress Notes (Signed)
One SST tube of blood drawn from left [facility] space without difficulty for LP labs; site unremarkable.  Discharge instructions explained to patient and she states an understanding of them.

## 2021-01-03 NOTE — Discharge Instructions (Signed)

## 2021-01-04 LAB — CYTOLOGY - NON PAP

## 2021-01-05 ENCOUNTER — Telehealth: Payer: Self-pay

## 2021-01-05 NOTE — Telephone Encounter (Signed)
Pt advised of Dr.Jaffe note and my chart message sent as well.

## 2021-01-05 NOTE — Telephone Encounter (Signed)
Patient called and left a message requesting a call back from a nurse. She said she is still having issues after having a lumbar puncture on Monday, 01/03/21.

## 2021-01-05 NOTE — Telephone Encounter (Signed)
Please check labs. Critical lab results sent via EMR

## 2021-01-05 NOTE — Telephone Encounter (Signed)
Reviewed

## 2021-01-06 ENCOUNTER — Ambulatory Visit: Payer: 59 | Admitting: Family Medicine

## 2021-01-06 NOTE — Progress Notes (Signed)
It looks like these test were ordered and done by neurology. Some test are in the prelim stages but thus far does not appear to be any malignant cells found in lumbar puncture which is GREAT news.   Iran Planas PA-C covering Dr. Jerilynn Mages.

## 2021-01-09 NOTE — Progress Notes (Addendum)
NEUROLOGY FOLLOW UP OFFICE NOTE  Anna Ramsey [id number]  Assessment/Plan:   1.  Numbness and weakness - unclear etiology.  No abnormality of brain and spinal cord (including lumbar), polyneuropathy, or  inflammatory etiology (CNS or CIDP)- however, the CSF did reveal oligoclonal bands also present in the serum, suggesting a systemic etiology rather than neurologic  Flow cytometry still pending but may not be completed as cytology was negative.  ACE still pending.  If abnormal, will contact patient.  Regarding cognitive deficits, I suggested neuropsychological testing.  She defers at this time.  However, I have no explanation for her physical symptoms.  Subjective:  Anna Ramsey is a 55 year old female with HTN, thyroid disease, and history of cervical cancer who who follows up lower extremity numbness and weakness.  She is accompanied by her husband who supplements history.    UPDATE: At last visit, she stated that she never had associated pain with the numbness, so Lyrica was discontinued. However, she then stated that she had pain, so it was restarted.    NCV-EMG of lower extremities showed no evidence of polyneuropathy but did demonstrate findings suggestive of right worse than left L3-4 radiculopathy.  As imaging of the lumbar spine did not reveal any structural cause, she underwent LP for CSF analysis of an inflammatory etiology.  CSF cell count 1, protein 31, glucose 52, gram stain with rare WBC but no organisms, cytology with no malignant cells, Lyme IgG/IgM negative, 3 oligoclonal bands (also detected in serum), IgG index 0.55.  ACE and flow cytometry pending.  She briefly had a post-LP headache, which has since subsided.  HISTORY: Patient has history of cervical cancer status post hysterectomy and pelvic radiation in 2015.  Around 2018, she sprained her right ankle.  She began noticing tingling and numbness on the bottom of her right foot that spread up her leg and  then involved her left leg.  The numbness typically radiated up to her knees but sometimes it extends to the waist.  Her legs feel like cement and struggles to lift her legs.  Sometimes her right leg may give out.  History states chronic low back pain with radiculopathy but she denies back pain or radicular pain in the legs.  She has undergone epidural injections in the low back which has not helped the numbness.  She denies neuropathic pain such as burning, stabbing or shocks .  She also had some cognitive changes, finding it more difficulty to multitask.  She was found to have hypothyroidism which has since been treated.  Other labs checked included B12 540, folate 6.2, B1 8, ferritin 128, Mg 2.1 and mildly elevated B6 of 29.2.  MRI of lumbar spine on 08/01/2017 showed levoscoliosis without any significant disc herniation, spinal or neuroforaminal stenosis.  She had another MRI of lumbar spine on 01/05/2020 which was also negative for any explanation.  MRI of cervical and thoracic spine in August 2021 showed right sided foraminal stenosis at C6-7 but otherwise unremarkable.  She started experiencing numbness and tingling in the mouth  MRI of brain without contrast on 01/05/2020 was personally reviewed and was normal.  She has been evaluated by neurology in [facility].  She underwent a NCV-EMG that reportedly showed mild to moderate peripheral neuropathy.  She ambulates with a cane and feels that she is getting weaker.  She has previously on gabapentin but now takes Lyrica.  However, she says the Lyrica isn't helpful with the numbness and heaviness in  her legs.  PAST MEDICAL HISTORY: Past Medical History:  Diagnosis Date  . Dysuria   . Erythematous bladder mucosa   . Heart murmur    asymptomatic per pt --  no echo  . Hematuria   . History of cervical cancer    12/ 2014  Stage IB2--  s/p  TAH w/ BSO and pelvic lymphadectomy/  and radiation therapy  . History of radiation therapy 10/20/13-11/28/13   pelvis  50.4 gray  . Hypertension   . Lesion of bladder   . Mild intermittent asthma   . Neuromuscular disorder ([place])   . Seasonal allergies   . Thyroid disease     MEDICATIONS: Current Outpatient Medications on File Prior to Visit  Medication Sig Dispense Refill  . albuterol (VENTOLIN HFA) 108 (90 Base) MCG/ACT inhaler Inhale 1-2 puffs into the lungs every 6 (six) hours as needed for wheezing or shortness of breath. 36 g 2  . budesonide-formoterol (SYMBICORT) 80-4.5 MCG/ACT inhaler Inhale 2 puffs into the lungs 2 (two) times daily. (Patient taking differently: Inhale 1 puff into the lungs daily.) 1 Inhaler 5  . buPROPion (WELLBUTRIN XL) 150 MG 24 hr tablet TAKE 1 TABLET EVERY MORNING 90 tablet 3  . cetirizine (ZYRTEC) 10 MG tablet Take 10 mg by mouth at bedtime.    . fluticasone (FLONASE) 50 MCG/ACT nasal spray Place 2 sprays into both nostrils daily as needed.     [name] levothyroxine (SYNTHROID) 88 MCG tablet TAKE 1 TABLET DAILY 90 tablet 3  . metoprolol tartrate (LOPRESSOR) 100 MG tablet TAKE 1 TABLET TWICE A DAY 180 tablet 3  . PARoxetine (PAXIL) 30 MG tablet TAKE 1 TABLET DAILY 90 tablet 3  . pregabalin (LYRICA) 100 MG capsule Take 1 capsule (100 mg total) by mouth 3 (three) times daily. 45 capsule 0  . tolterodine (DETROL LA) 4 MG 24 hr capsule TAKE 1 CAPSULE DAILY 90 capsule 3  . traZODone (DESYREL) 50 MG tablet TAKE 1 TABLET AT BEDTIME 90 tablet 3   No current facility-administered medications on file prior to visit.    ALLERGIES: Allergies  Allergen Reactions  . Sulfa Antibiotics Other (See Comments)    Numbness in lower extremities from knees down  . Cefdinir   . Penicillins Other (See Comments)    Unknown childhood reaction    FAMILY HISTORY: Family History  Problem Relation Age of Onset  . Heart attack Father 38  . Diabetes Father   . Hypertension Maternal Grandmother   . Stroke Maternal Grandmother   . Cancer Mother        breast/thyroid      Objective:  Blood  pressure 118/70, resp. rate 20, height 5\' 2"  (1.575 m), weight 121 lb (54.9 kg), last menstrual period 08/19/2013. General: No acute distress.  Patient appears well-groomed.     Metta Clines, DO  CC: Beatrice Lecher, MD

## 2021-01-10 ENCOUNTER — Encounter: Payer: Self-pay | Admitting: Neurology

## 2021-01-10 ENCOUNTER — Ambulatory Visit (INDEPENDENT_AMBULATORY_CARE_PROVIDER_SITE_OTHER): Payer: 59 | Admitting: Neurology

## 2021-01-10 ENCOUNTER — Other Ambulatory Visit: Payer: Self-pay

## 2021-01-10 VITALS — BP 118/70 | Resp 20 | Ht 62.0 in | Wt 121.0 lb

## 2021-01-10 DIAGNOSIS — R413 Other amnesia: Secondary | ICD-10-CM

## 2021-01-10 DIAGNOSIS — R200 Anesthesia of skin: Secondary | ICD-10-CM

## 2021-01-10 DIAGNOSIS — R531 Weakness: Secondary | ICD-10-CM | POA: Diagnosis not present

## 2021-01-10 NOTE — Patient Instructions (Signed)
1.  Will let you know if the remaining CSF tests show anything 2.  Consider neurocognitive testing if memory problems progress

## 2021-01-12 LAB — PROTEIN, CSF: Total Protein, CSF: 31 mg/dL (ref 15–45)

## 2021-01-12 LAB — CSF CULTURE W GRAM STAIN
MICRO NUMBER:: 11754088
Result:: NO GROWTH
SPECIMEN QUALITY:: ADEQUATE

## 2021-01-12 LAB — CNS IGG SYNTHESIS RATE, CSF+BLOOD
Albumin Serum: 4.2 g/dL (ref 3.5–5.2)
Albumin, CSF: 10.4 mg/dL (ref 8.0–42.0)
CNS-IgG Synthesis Rate: -2.7 mg/24 h (ref <=3.3)
IgG (Immunoglobin G), Serum: 1020 mg/dL (ref 600–1640)
IgG Total CSF: 1.4 mg/dL (ref 0.8–7.7)
IgG-Index: 0.55 (ref <0.66)

## 2021-01-12 LAB — CSF CELL COUNT WITH DIFFERENTIAL
RBC Count, CSF: 11 cells/uL — ABNORMAL HIGH
WBC, CSF: 1 cells/uL (ref 0–5)

## 2021-01-12 LAB — OLIGOCLONAL BANDS, CSF + SERM

## 2021-01-12 LAB — LYME DISEASE ABS IGG, IGM, IFA, CSF
Lyme Disease AB (IgG), IBL: NOT DETECTED
Lyme Disease AB (IgM), IBL: NOT DETECTED

## 2021-01-12 LAB — ANGIOTENSIN CONVERTING ENZYME, CSF: ANGIOTENSIN CONVERTING ENZYME ( ACE) CSF: 2 U/L (ref <=15)

## 2021-01-12 LAB — GLUCOSE, CSF: Glucose, CSF: 52 mg/dL (ref 40–80)

## 2021-01-12 LAB — LEUKEMIA/LYMPHOMA EVALUATION PANEL

## 2021-01-12 NOTE — Progress Notes (Signed)
Established Patient Office Visit  Subjective:  Patient ID: Anna Ramsey, female    DOB: [birth year]  Age: 55 y.o. MRN: [id number]  CC:  Chief Complaint  Patient presents with  . Follow-up    HPI Anna Ramsey presents for   Hypertension- Pt denies chest pain, SOB, dizziness, or heart palpitations.  Taking meds as directed w/o problems.  Denies medication side effects.    Hypothyroidism - Taking medication regularly in the AM away from food and vitamins, etc. No recent change to skin, hair, or energy levels.  She is currently being treated for a sinus infection. She is on antibiotics and has been taking a decongestant which she suspects is probably bumping up her blood pressure today.  Due for 2nd shingles   She also recently had a consult with a new neurologist to try to determine exactly why she has developed neuropathy of both lower extremities unfortunately none of the testing were revealing for potential cause and so this is just been really frustrating its been completely life altering she is to be an extremely active person.  She would headache and do roller Derby etc. and now her family does not even want her walking across the room with the grandbaby because they are afraid she is going to fall.  Past Medical History:  Diagnosis Date  . Dysuria   . Erythematous bladder mucosa   . Heart murmur    asymptomatic per pt --  no echo  . Hematuria   . History of cervical cancer    12/ 2014  Stage IB2--  s/p  TAH w/ BSO and pelvic lymphadectomy/  and radiation therapy  . History of radiation therapy 10/20/13-11/28/13   pelvis 50.4 gray  . Hypertension   . Lesion of bladder   . Mild intermittent asthma   . Neuromuscular disorder ([place])   . Seasonal allergies   . Thyroid disease     Past Surgical History:  Procedure Laterality Date  . CYSTOSCOPY WITH BIOPSY N/A 04/29/2015   Procedure: CYSTOSCOPY WITH BIOPSY WITH FULGERATION;  Surgeon: [name], MD;   Location: [facility];  Service: Urology;  Laterality: N/A;  1 HR [phone] [id number]  . FOOT SURGERY Left 2010  . RADICAL ABDOMINAL HYSTERECTOMY  09-05-2013   w/ BILATERAL SALPINGOOPHORECTOMY AND PELVIC LYMPHADECTOMY    Family History  Problem Relation Age of Onset  . Heart attack Father 50  . Diabetes Father   . Hypertension Maternal Grandmother   . Stroke Maternal Grandmother   . Cancer Mother        breast/thyroid    Social History   Socioeconomic History  . Marital status: Married    Spouse name: Not on file  . Number of children: 1  . Years of education: Not on file  . Highest education level: Not on file  Occupational History    Employer: [employer]  Tobacco Use  . Smoking status: Former Smoker    Packs/day: 0.50    Years: 25.00    Pack years: 12.50    Types: E-cigarettes, Cigarettes    Quit date: 09/25/2012    Years since quitting: 8.3  . Smokeless tobacco: Never Used  . Tobacco comment: has been using e-cig for past 2 yrs  Substance and Sexual Activity  . Alcohol use: Yes    Alcohol/week: 1.0 standard drink    Types: 1 Standard drinks or equivalent per week    Comment: occasional  . Drug use: No  . Sexual  activity: Not on file  Other Topics Concern  . Not on file  Social History Narrative   Right handed   Social Determinants of Health   Financial Resource Strain: Not on file  Food Insecurity: Not on file  Transportation Needs: Not on file  Physical Activity: Not on file  Stress: Not on file  Social Connections: Not on file  Intimate Partner Violence: Not on file    Outpatient Medications Prior to Visit  Medication Sig Dispense Refill  . albuterol (VENTOLIN HFA) 108 (90 Base) MCG/ACT inhaler Inhale 1-2 puffs into the lungs every 6 (six) hours as needed for wheezing or shortness of breath. 36 g 2  . budesonide-formoterol (SYMBICORT) 80-4.5 MCG/ACT inhaler Inhale 2 puffs into the lungs 2 (two) times daily. (Patient taking differently:  Inhale 1 puff into the lungs daily.) 1 Inhaler 5  . buPROPion (WELLBUTRIN XL) 150 MG 24 hr tablet TAKE 1 TABLET EVERY MORNING 90 tablet 3  . cetirizine (ZYRTEC) 10 MG tablet Take 10 mg by mouth at bedtime.    . fluticasone (FLONASE) 50 MCG/ACT nasal spray Place 2 sprays into both nostrils daily as needed.     [name] levothyroxine (SYNTHROID) 88 MCG tablet TAKE 1 TABLET DAILY 90 tablet 3  . metoprolol tartrate (LOPRESSOR) 100 MG tablet TAKE 1 TABLET TWICE A DAY 180 tablet 3  . PARoxetine (PAXIL) 30 MG tablet TAKE 1 TABLET DAILY 90 tablet 3  . pregabalin (LYRICA) 100 MG capsule Take 1 capsule (100 mg total) by mouth 3 (three) times daily. 45 capsule 0  . tolterodine (DETROL LA) 4 MG 24 hr capsule TAKE 1 CAPSULE DAILY 90 capsule 3  . traZODone (DESYREL) 50 MG tablet TAKE 1 TABLET AT BEDTIME 90 tablet 3   No facility-administered medications prior to visit.    Allergies  Allergen Reactions  . Sulfa Antibiotics Other (See Comments)    Numbness in lower extremities from knees down  . Cefdinir   . Penicillins Other (See Comments)    Unknown childhood reaction    ROS Review of Systems    Objective:    Physical Exam Constitutional:      Appearance: She is well-developed.  HENT:     Head: Normocephalic and atraumatic.  Cardiovascular:     Rate and Rhythm: Normal rate and regular rhythm.     Heart sounds: Normal heart sounds.  Pulmonary:     Effort: Pulmonary effort is normal.     Breath sounds: Normal breath sounds.  Skin:    General: Skin is warm and dry.  Neurological:     Mental Status: She is alert and oriented to person, place, and time.  Psychiatric:        Behavior: Behavior normal.     BP 137/77   Pulse 72   Ht 5\' 2"  (1.575 m)   Wt 128 lb (58.1 kg)   LMP 08/19/2013   SpO2 97%   BMI 23.41 kg/m  Wt Readings from Last 3 Encounters:  01/13/21 128 lb (58.1 kg)  01/10/21 121 lb (54.9 kg)  11/15/20 127 lb (57.6 kg)     Health Maintenance Due  Topic Date Due  .  Hepatitis C Screening  Never done    There are no preventive care reminders to display for this patient.  Lab Results  Component Value Date   TSH 1.49 07/08/2020   Lab Results  Component Value Date   WBC 3.5 (L) 05/11/2016   HGB 15.0 05/11/2016   HCT 44.6 05/11/2016  MCV 96.5 05/11/2016   PLT 249 05/11/2016   Lab Results  Component Value Date   NA 142 07/08/2020   K 4.5 07/08/2020   CO2 27 07/08/2020   GLUCOSE 100 (H) 07/08/2020   BUN 10 07/08/2020   CREATININE 0.73 07/08/2020   BILITOT 0.6 10/28/2019   ALKPHOS 106 05/11/2016   AST 22 10/28/2019   ALT 18 10/28/2019   PROT 7.0 10/28/2019   ALBUMIN 4.2 01/03/2021   CALCIUM 9.6 07/08/2020   Lab Results  Component Value Date   CHOL 195 10/28/2019   Lab Results  Component Value Date   HDL 59 10/28/2019   Lab Results  Component Value Date   LDLCALC 115 (H) 10/28/2019   Lab Results  Component Value Date   TRIG 104 10/28/2019   Lab Results  Component Value Date   CHOLHDL 3.3 10/28/2019   Lab Results  Component Value Date   HGBA1C 5.4 06/28/2017      Assessment & Plan:   Problem List Items Addressed This Visit      Cardiovascular and Mediastinum   HYPERTENSION, BENIGN - Primary    Well controlled. Continue current regimen. Follow up in  6 mo       Relevant Orders   COMPLETE METABOLIC PANEL WITH GFR   Lipid panel   CBC   TSH     Endocrine   Hypothyroid    To recheck thyroid level.      Relevant Orders   TSH     Nervous and Auditory   Neuropathy involving both lower extremities    She normally follows with Dr. Tomi Likens, neurology.  And recently had a consult with a new neurologist unfortunately they did not find anything revealing for the potential cause of her neuropathy.  I do think she would benefit from aquatic therapy so we will go ahead and place referral.      Relevant Orders   Ambulatory referral to Physical Therapy   Ambulatory referral to [facility]     Other   Stress and  adjustment reaction    Will refer to Janett Billow, her neuropathy has really turned her life up side down and has really restricted her activities and so she went from being an extremely active outgoing person to really limited activities.  I think she would really benefit from talking to a therapist or counselor.       Other Visit Diagnoses    Need for shingles vaccine       Need for Zostavax administration       Relevant Orders   Varicella-zoster vaccine IM (Shingrix) (Completed)      She is on antibiotics for sinus infection and says she is feeling much better.  No orders of the defined types were placed in this encounter.   Follow-up: Return in about 6 months (around 07/15/2021) for Hypertension.    Beatrice Lecher, MD

## 2021-01-13 ENCOUNTER — Ambulatory Visit (INDEPENDENT_AMBULATORY_CARE_PROVIDER_SITE_OTHER): Payer: 59 | Admitting: Family Medicine

## 2021-01-13 VITALS — BP 137/77 | HR 72 | Ht 62.0 in | Wt 128.0 lb

## 2021-01-13 DIAGNOSIS — G5793 Unspecified mononeuropathy of bilateral lower limbs: Secondary | ICD-10-CM

## 2021-01-13 DIAGNOSIS — Z23 Encounter for immunization: Secondary | ICD-10-CM

## 2021-01-13 DIAGNOSIS — E038 Other specified hypothyroidism: Secondary | ICD-10-CM | POA: Diagnosis not present

## 2021-01-13 DIAGNOSIS — I10 Essential (primary) hypertension: Secondary | ICD-10-CM

## 2021-01-13 DIAGNOSIS — F4329 Adjustment disorder with other symptoms: Secondary | ICD-10-CM

## 2021-01-13 NOTE — Assessment & Plan Note (Signed)
To recheck thyroid level.

## 2021-01-13 NOTE — Assessment & Plan Note (Signed)
Will refer to Anna Ramsey, her neuropathy has really turned her life up side down and has really restricted her activities and so she went from being an extremely active outgoing person to really limited activities.  I think she would really benefit from talking to a therapist or counselor.

## 2021-01-13 NOTE — Assessment & Plan Note (Signed)
Well controlled. Continue current regimen. Follow up in  6 mo  

## 2021-01-13 NOTE — Progress Notes (Signed)
Pt advised of labs.

## 2021-01-13 NOTE — Assessment & Plan Note (Signed)
She normally follows with Dr. Tomi Likens, neurology.  And recently had a consult with a new neurologist unfortunately they did not find anything revealing for the potential cause of her neuropathy.  I do think she would benefit from aquatic therapy so we will go ahead and place referral.

## 2021-01-14 LAB — COMPLETE METABOLIC PANEL WITH GFR
AG Ratio: 1.8 (calc) (ref 1.0–2.5)
ALT: 24 U/L (ref 6–29)
AST: 23 U/L (ref 10–35)
Albumin: 4.2 g/dL (ref 3.6–5.1)
Alkaline phosphatase (APISO): 114 U/L (ref 37–153)
BUN: 12 mg/dL (ref 7–25)
CO2: 27 mmol/L (ref 20–32)
Calcium: 9.3 mg/dL (ref 8.6–10.4)
Chloride: 104 mmol/L (ref 98–110)
Creat: 0.63 mg/dL (ref 0.50–1.05)
GFR, Est African American: 120 mL/min/{1.73_m2} (ref >=60)
GFR, Est Non African American: 103 mL/min/{1.73_m2} (ref >=60)
Globulin: 2.4 g/dL (calc) (ref 1.9–3.7)
Glucose, Bld: 81 mg/dL (ref 65–139)
Potassium: 4.3 mmol/L (ref 3.5–5.3)
Sodium: 139 mmol/L (ref 135–146)
Total Bilirubin: 0.4 mg/dL (ref 0.2–1.2)
Total Protein: 6.6 g/dL (ref 6.1–8.1)

## 2021-01-14 LAB — CBC
HCT: 43.8 % (ref 35.0–45.0)
Hemoglobin: 14.9 g/dL (ref 11.7–15.5)
MCH: 33 pg (ref 27.0–33.0)
MCHC: 34 g/dL (ref 32.0–36.0)
MCV: 96.9 fL (ref 80.0–100.0)
MPV: 9.9 fL (ref 7.5–12.5)
Platelets: 284 10*3/uL (ref 140–400)
RBC: 4.52 10*6/uL (ref 3.80–5.10)
RDW: 13.1 % (ref 11.0–15.0)
WBC: 5.3 10*3/uL (ref 3.8–10.8)

## 2021-01-14 LAB — LIPID PANEL
Cholesterol: 216 mg/dL — ABNORMAL HIGH (ref <200)
HDL: 57 mg/dL (ref >=50)
LDL Cholesterol (Calc): 142 mg/dL (calc) — ABNORMAL HIGH
Non-HDL Cholesterol (Calc): 159 mg/dL (calc) — ABNORMAL HIGH (ref <130)
Total CHOL/HDL Ratio: 3.8 (calc) (ref <5.0)
Triglycerides: 80 mg/dL (ref <150)

## 2021-01-14 LAB — TSH: TSH: 3.57 mIU/L

## 2021-01-17 ENCOUNTER — Encounter: Payer: Self-pay | Admitting: Family Medicine

## 2021-01-19 ENCOUNTER — Ambulatory Visit: Payer: 59 | Admitting: Rehabilitative and Restorative Service Providers"

## 2021-01-26 ENCOUNTER — Encounter: Payer: Self-pay | Admitting: Rehabilitative and Restorative Service Providers"

## 2021-01-26 ENCOUNTER — Other Ambulatory Visit: Payer: Self-pay

## 2021-01-26 ENCOUNTER — Ambulatory Visit (INDEPENDENT_AMBULATORY_CARE_PROVIDER_SITE_OTHER): Payer: 59 | Admitting: Rehabilitative and Restorative Service Providers"

## 2021-01-26 DIAGNOSIS — R29818 Other symptoms and signs involving the nervous system: Secondary | ICD-10-CM

## 2021-01-26 DIAGNOSIS — M6281 Muscle weakness (generalized): Secondary | ICD-10-CM | POA: Diagnosis not present

## 2021-01-26 DIAGNOSIS — R2689 Other abnormalities of gait and mobility: Secondary | ICD-10-CM

## 2021-01-26 DIAGNOSIS — R2681 Unsteadiness on feet: Secondary | ICD-10-CM

## 2021-01-26 NOTE — Therapy (Signed)
[facility] [place], [place], [address] Phone: [phone]   Fax:  [phone]  Physical Therapy Evaluation  Patient Details  Name: Anna Ramsey MRN: [id number] Date of Birth: 04/19/1966 Referring Provider (PT): Beatrice Lecher, MD   Encounter Date: 01/26/2021   PT End of Session - 01/26/21 1020    Visit Number 1    Number of Visits 12    Date for PT Re-Evaluation 03/09/21    Authorization Type cigna    PT Start Time 1022    PT Stop Time 1100    PT Time Calculation (min) 38 min    Activity Tolerance Patient tolerated treatment well    Behavior During Therapy [facility] for tasks assessed/performed           Past Medical History:  Diagnosis Date  . Dysuria   . Erythematous bladder mucosa   . Heart murmur    asymptomatic per pt --  no echo  . Hematuria   . History of cervical cancer    12/ 2014  Stage IB2--  s/p  TAH w/ BSO and pelvic lymphadectomy/  and radiation therapy  . History of radiation therapy 10/20/13-11/28/13   pelvis 50.4 gray  . Hypertension   . Lesion of bladder   . Mild intermittent asthma   . Neuromuscular disorder ([place])   . Seasonal allergies   . Thyroid disease     Past Surgical History:  Procedure Laterality Date  . CYSTOSCOPY WITH BIOPSY N/A 04/29/2015   Procedure: CYSTOSCOPY WITH BIOPSY WITH FULGERATION;  Surgeon: [name], MD;  Location: [facility];  Service: Urology;  Laterality: N/A;  1 HR [phone] [id number]  . FOOT SURGERY Left 2010  . RADICAL ABDOMINAL HYSTERECTOMY  09-05-2013   w/ BILATERAL SALPINGOOPHORECTOMY AND PELVIC LYMPHADECTOMY    There were no vitals filed for this visit.    Subjective Assessment - 01/26/21 1022    Subjective The patient is referred today for neuropathy in bilat LEs.  She underwent radiation in 2015 for cervical cancer, had an injury to her R ankle in 2017 that began a pins and needles sensation in  the R foot.  She feels that sensory change progressed to pins and needles + numbness moving up the right leg.  Then, it morphed into a sensation of someone pouring cement into her leg.  "I use my hip to make sure I lift my foot up."   The R leg is the worst.  The L leg typically feels okay, however there are times that pressure changes, fatigue or other factors can lead to excessive fatigue and sensory changes.    Pertinent History cervical cancer with radiation, h/o R ankle sprain, h/o R ankle fracture, thyroid  disease, fractured tibia L, L ankle fx 11 years ago    Diagnostic tests Impression:  1. The electrophysiologic findings are most suggestive of a L3-4 radiculopathy affecting bilateral lower extremities, worse on the right.  2. There is no evidence of a sensorimotor polyneuropathy affecting the lower extremities.    Patient Stated Goals "what I would love to do is be able to bend down and use my legs to get up"; has used cane 5 years    Currently in Pain? No/denies              [facility] PT Assessment - 01/26/21 1020      Assessment   Medical Diagnosis neuropathy involving both LEs    Referring Provider (PT) Barnetta Chapel  Madilyn Fireman, MD    Onset Date/Surgical Date 01/13/21    Hand Dominance Right      Balance Screen   Has the patient fallen in the past 6 months Yes    How many times? "more than a handful"; estimates 1x per month; falls when fatigued-- R knee gives way    Has the patient had a decrease in activity level because of a fear of falling?  Yes    Is the patient reluctant to leave their home because of a fear of falling?  No      Home Environment   Living Environment Private residence    Living Arrangements Spouse/significant other    Home Access Stairs to enter    Entrance Stairs-Number of Steps 2    [place] One level      Prior Function   Level of Independence Independent with community mobility with device;Independent with basic ADLs    Vocation Full time employment     Vocation Requirements works from home for [employer]      Observation/Other Assessments   Focus on [place]  n/a due to neuro presentation      Sensation   Light Touch Impaired Detail    Light Touch Impaired Details Impaired RUE;Impaired LUE;Impaired RLE;Impaired LLE    Additional Comments Patient feels shakiness in her hands in the morning; gets some tingling in the hands in the morning (better with lyrica); R leg worse than L but n/t both.      Posture/Postural Control   Posture Comments toes out bilaterally      ROM / Strength   AROM / PROM / Strength AROM;Strength      AROM   Overall AROM  Within functional limits for tasks performed      Strength   Overall Strength Deficits    Strength Assessment Site Shoulder;Elbow;Wrist;Hip;Knee;Ankle    Right/Left Shoulder Right;Left    Right Shoulder Flexion 5/5    Right Shoulder ABduction 5/5    Left Shoulder Flexion 5/5    Left Shoulder ABduction 5/5    Right/Left Elbow Right;Left    Right Elbow Flexion 5/5    Right Elbow Extension 5/5    Left Elbow Flexion 5/5    Left Elbow Extension 5/5    Right/Left Wrist Right;Left    Right Wrist Flexion 5/5    Right Wrist Extension 5/5    Left Wrist Flexion 5/5    Left Wrist Extension 5/5    Right/Left Hip Right;Left    Right Hip Flexion 3/5    Right Hip Extension 2/5    Right Hip ABduction 3/5    Left Hip Flexion 4-/5    Left Hip Extension 3/5    Left Hip ABduction 4+/5    Right/Left Knee Right;Left    Right Knee Flexion 3/5    Right Knee Extension 3/5    Left Knee Flexion 5/5    Left Knee Extension 5/5    Right/Left Ankle Right;Left    Right Ankle Dorsiflexion 3/5    Right Ankle Plantar Flexion 4/5    Left Ankle Dorsiflexion 4/5    Left Ankle Plantar Flexion 4/5                      Objective measurements completed on examination: See above findings.       [facility] Adult PT Treatment/Exercise - 01/26/21 1048      Exercises    Exercises Knee/Hip;Lumbar      Lumbar Exercises:  Supine   Bridge 10 reps      Knee/Hip Exercises: Standing   Wall Squat 5 reps    Wall Squat Limitations cues on technique      Knee/Hip Exercises: Sidelying   Clams 10 reps      Knee/Hip Exercises: Prone   Hamstring Curl 10 reps   right                 PT Education - 01/26/21 1130    Education Details HEP    Person(s) Educated Patient    Methods Explanation;Demonstration;Handout    Comprehension Verbalized understanding;Returned demonstration               PT Long Term Goals - 01/26/21 1128      PT LONG TERM GOAL #1   Title The patient will be indep with HEP for LE strengthening, balance and moiblity.    Time 6    Period Weeks    Target Date 03/09/21      PT LONG TERM GOAL #2   Title The patient will improve R LE strength to 4/5 throughout for knee flexion, hip flexion, ankle DF, hip extension and hip abduction.    Time 6    Period Weeks    Target Date 03/09/21      PT LONG TERM GOAL #3   Title The patient will demonstrate ability to squat to the floor and return to standing without pressing through furniture.    Time 6    Period Weeks    Target Date 03/09/21      PT LONG TERM GOAL #4   Title The patient will be further assessed for standing balance and gait speed and goals to follow.    Time 6    Period Weeks    Target Date 03/09/21                  Plan - 01/26/21 1132    Clinical Impression Statement The patient is a 55 yo female presenting to OP physical therapy with chronic LE weakness with unknown etiology.  She began developing LE sensory changes and weakness in 2017. She presents with impairments in bilat LE strength, sensory changes, imbalance, gait abnormalities limiting participation in community mobility and recreational tasks.  Patient relies on single point cane for mobility outside of the home and furniture walks indoors.  PT to address deficits to promote improved functional  status.    Personal Factors and Comorbidities Time since onset of injury/illness/exacerbation;Comorbidity 3+    Comorbidities cervical cancer with radiation, h/o falls, h/o multiple LE fractures    Examination-Activity Limitations Squat;Stairs;Stand;Lift;Locomotion Level    Examination-Participation Restrictions Occupation;Community Activity    Stability/Clinical Decision Making Stable/Uncomplicated    Clinical Decision Making Low    Rehab Potential Good    PT Frequency 2x / week    PT Duration 6 weeks    PT Treatment/Interventions Aquatic Therapy;ADLs/Self Care Home Management;Patient/family education;Manual techniques;Functional mobility training;Therapeutic activities;Therapeutic exercise;Balance training;Neuromuscular re-education;Gait training;Stair training;DME Instruction;Orthotic Fit/Training    PT Next Visit Plan progress HEP (keep </= 6 exercises); aquatic activities, LE strengthening, core stability, balance, gait training *on land:  do Berg and gait speed/further gait analysis    PT Home Exercise Plan [email or web address]    Consulted and Agree with Plan of Care Patient           Patient will benefit from skilled therapeutic intervention in order to improve the following deficits and impairments:  Impaired sensation,Decreased strength,Postural dysfunction,Decreased activity tolerance,Abnormal gait,Impaired tone  Visit Diagnosis: Muscle weakness (generalized)  Other abnormalities of gait and mobility  Unsteadiness on feet  Other symptoms and signs involving the nervous system     Problem List Patient Active Problem List   Diagnosis Date Noted  . Stress and adjustment reaction 01/13/2021  . Inattention 10/28/2019  . Hypothyroid 11/13/2016  . Neuropathy involving both lower extremities 11/13/2016  . Cystitis, radiation 05/22/2014  . Cervical cancer ([place]) 09/01/2013  . Adenocarcinoma of cervix, stage 1 ([place]) 08/26/2013  . Asthma, mild intermittent 07/07/2009  . INSOMNIA  05/28/2008  . GENERALIZED ANXIETY DISORDER 05/07/2008  . HYPERTENSION, BENIGN 05/31/2007    [place], PT 01/26/2021, 11:37 AM  [facility] [place], [place], [address] Phone: [phone]   Fax:  [phone]  Name: IDELL DUQUAINE MRN: [id number] Date of Birth: 10-05-1965

## 2021-01-26 NOTE — Patient Instructions (Signed)
   Aquatic Therapy: What to Expect!  Where:  [facility] [address] [place], [place] [address] [phone]           How to Prepare: . Please make sure you drink 8 ounces of water about one hour prior to your pool session . A caregiver must attend the entire session with the patient (unless your primary therapists feels this is not necessary). The caregiver will be responsible for assisting with dressing as well as any toileting needs.  . Please arrive IN YOUR SUIT and a few minutes prior to your appointment - this helps to avoid delays in starting your session. . Please make sure to attend to any toileting needs prior to entering the pool. . Once on the pool deck your therapist will ask you to sign the Patient  Consent and Assignment of Benefits form. . Your therapist may take your blood pressure prior to, during and after your session if indicated. . We usually try and create a home exercise program based on activities we do in the pool.  Please be thinking about who might be able to assist you in the pool should you want to participate in an aquatic home exercise program at the time of discharge.  Some patients do not want to or do not have the ability to participate in an aquatic home program - this is not a barrier in any way to you participating in aquatic therapy as part of your current therapy plan!  Appointments:  All sessions are 45 minutes  About the pool: 1. Entering the pool Your therapist will assist you; there are two ways to enter the pool - stairs or a mechanical lift. Your therapist will determine the most appropriate way for you. 2. Water temperature is usually around 90-91.  There is a lap pool with a temperature around 84 3. There may be other swimmers in the pool at the same time.   Contact Info:             To cancel appointment, please call [place] at [phone] If you are running late, please  call [facility] at [phone]                   Access Code: [email or web address] URL: [email or web address] Date: 01/26/2021 Prepared by: Rudell Cobb  Exercises Prone Knee Flexion AROM - 2 x daily - 7 x weekly - 1 sets - 10 reps Beginner Clam - 2 x daily - 7 x weekly - 1 sets - 10 reps Bridge - 2 x daily - 7 x weekly - 1 sets - 10 reps Wall Quarter Squat - 2 x daily - 7 x weekly - 1 sets - 10 reps

## 2021-01-28 ENCOUNTER — Encounter: Payer: Self-pay | Admitting: Physical Therapy

## 2021-01-28 ENCOUNTER — Other Ambulatory Visit: Payer: Self-pay

## 2021-01-28 ENCOUNTER — Ambulatory Visit (INDEPENDENT_AMBULATORY_CARE_PROVIDER_SITE_OTHER): Payer: 59 | Admitting: Physical Therapy

## 2021-01-28 DIAGNOSIS — M6281 Muscle weakness (generalized): Secondary | ICD-10-CM | POA: Diagnosis not present

## 2021-01-28 DIAGNOSIS — R2681 Unsteadiness on feet: Secondary | ICD-10-CM | POA: Diagnosis not present

## 2021-01-28 DIAGNOSIS — R2689 Other abnormalities of gait and mobility: Secondary | ICD-10-CM

## 2021-01-28 DIAGNOSIS — R29818 Other symptoms and signs involving the nervous system: Secondary | ICD-10-CM | POA: Diagnosis not present

## 2021-01-28 NOTE — Therapy (Signed)
[place], [place], [address] Phone: [phone]   Fax:  [phone]  Physical Therapy Treatment  Patient Details  Name: Anna Ramsey MRN: [id number] Date of Birth: [birth year] Referring Provider (PT): Beatrice Lecher, MD   Encounter Date: 01/28/2021   PT End of Session - 01/28/21 1552    Visit Number 2    Number of Visits 12    Date for PT Re-Evaluation 03/09/21    Authorization Type cigna    PT Start Time 1500    PT Stop Time 1540    PT Time Calculation (min) 40 min    Activity Tolerance Patient tolerated treatment well    Behavior During Therapy [facility] for tasks assessed/performed           Past Medical History:  Diagnosis Date  . Dysuria   . Erythematous bladder mucosa   . Heart murmur    asymptomatic per pt --  no echo  . Hematuria   . History of cervical cancer    12/ 2014  Stage IB2--  s/p  TAH w/ BSO and pelvic lymphadectomy/  and radiation therapy  . History of radiation therapy 10/20/13-11/28/13   pelvis 50.4 gray  . Hypertension   . Lesion of bladder   . Mild intermittent asthma   . Neuromuscular disorder ([place])   . Seasonal allergies   . Thyroid disease     Past Surgical History:  Procedure Laterality Date  . CYSTOSCOPY WITH BIOPSY N/A 04/29/2015   Procedure: CYSTOSCOPY WITH BIOPSY WITH FULGERATION;  Surgeon: [name], MD;  Location: [facility];  Service: Urology;  Laterality: N/A;  1 HR [id number]  . FOOT SURGERY Left 2010  . RADICAL ABDOMINAL HYSTERECTOMY  09-05-2013   w/ BILATERAL SALPINGOOPHORECTOMY AND PELVIC LYMPHADECTOMY    There were no vitals filed for this visit.   Subjective Assessment - 01/28/21 1701    Subjective Pt reports she did the HEP exercises yesterday and felt fatigued after 10 reps. No other changes since eval.  She arrives to the pool for aquatic therapy ambulating with SPC.    Pertinent History  cervical cancer with radiation, h/o R ankle sprain, h/o R ankle fracture, thyroid  disease, fractured tibia L, L ankle fx 11 years ago    Diagnostic tests Impression:  1. The electrophysiologic findings are most suggestive of a L3-4 radiculopathy affecting bilateral lower extremities, worse on the right.  2. There is no evidence of a sensorimotor polyneuropathy affecting the lower extremities.    Patient Stated Goals "what I would love to do is be able to bend down and use my legs to get up"; has used cane 5 years    Currently in Pain? No/denies           Pt seen for aquatic therapy today.  Treatment took place in water 3.25-3.5 ft in depth at the [facility]. Temp of water was 88.  Pt entered/exited the pool via stairs independently with step to pattern and bilat UE on rail.  Treatment:   Side stepping holding on to pool edge.   Trial of forward gait holding on to aqua jogger barbell - pt very unsteady with buckling of LE; switched to forward gait with water floatation walker with improved gait quality as well as posture. Followed by backward gait with cues to increase Rt step length and allow Rt knee to bend.  Returned to forward gait: cues to keep Lt knee straight  until toe off and to increase heel strike bilat.    Seated on pool bench:   With floatation cuff on Rt ankle:  LAQ x 10 reps.    Standing holding on to side of pool :(with floatation cuff on ankle) - hip ext x 10 RLE.  Then LLE (no cuff) x 10.  (without floatation cuff) - Rt heel raises x 8 reps.  Rt quad stretch with Rt foot supported on noodle with knee flexed x 20 sec x 2 reps, then forward lean for Lt hamstring stretch, then coming upright into Rt hip flexor stretch x 3. Rt stance with Lt leg press on noodle x 5 reps, then Lt stance with Rt leg press down onto noodle x 5 - unable to control LE in either motion, therapist guided leg.  Lower back stretch with feet on pool wall, hands on wall, knees/hips flexed.   Repeated with feet on step, holding steps ladder and leaning back.   Rt leg step ups with BUE on rail; x 8 reps. Tactile and VC for weight shift and facilitating quad/glute contraction. Pt uses UE to pull body up some.    Squats, pushing yellow dumbbell under water x 10 reps with cues to keep elbows straight, core engaged.    Pt requires buoyancy for support and to offload joints with strengthening exercises. Viscosity of the water is needed for resistance of strengthening; water current perturbations provides challenge to standing balance unsupported, requiring increased core activation.     PT Long Term Goals - 01/26/21 1128      PT LONG TERM GOAL #1   Title The patient will be indep with HEP for LE strengthening, balance and moiblity.    Time 6    Period Weeks    Target Date 03/09/21      PT LONG TERM GOAL #2   Title The patient will improve R LE strength to 4/5 throughout for knee flexion, hip flexion, ankle DF, hip extension and hip abduction.    Time 6    Period Weeks    Target Date 03/09/21      PT LONG TERM GOAL #3   Title The patient will demonstrate ability to squat to the floor and return to standing without pressing through furniture.    Time 6    Period Weeks    Target Date 03/09/21      PT LONG TERM GOAL #4   Title The patient will be further assessed for standing balance and gait speed and goals to follow.    Time 6    Period Weeks    Target Date 03/09/21                 Plan - 01/28/21 1552    Clinical Impression Statement Pt demonstrated gait deviations in water, requiring UE support on floatation devices.  Pt did better with floatation walker with wider base of support, vs aqua jogger barbell.  Pt will benefit from continued gait training.Pt unable to control motion of leg successfully with Rt leg press (hip/knee flexion pressing foot down on pool noodle); required tactile cueing from therapist for motor control.  RLE much weaker than LLE; compensatory  strategies with UE and LLE for Rt forward step ups. She complained of some tightness in Rt low back after RLE exercises; reduced with low back stretch.  Goals are ongoing.    Personal Factors and Comorbidities Time since onset of injury/illness/exacerbation;Comorbidity 3+    Comorbidities cervical cancer with radiation, h/o  falls, h/o multiple LE fractures    Examination-Activity Limitations Squat;Stairs;Stand;Lift;Locomotion Level    Examination-Participation Restrictions Occupation;Community Activity    Stability/Clinical Decision Making Stable/Uncomplicated    Rehab Potential Good    PT Frequency 2x / week    PT Duration 6 weeks    PT Treatment/Interventions Aquatic Therapy;ADLs/Self Care Home Management;Patient/family education;Manual techniques;Functional mobility training;Therapeutic activities;Therapeutic exercise;Balance training;Neuromuscular re-education;Gait training;Stair training;DME Instruction;Orthotic Fit/Training    PT Next Visit Plan progress HEP (keep </= 6 exercises); aquatic activities, LE strengthening, core stability, balance, gait training *on land:  do Berg and gait speed/further gait analysis    PT Home Exercise Plan [email or web address]    Consulted and Agree with Plan of Care Patient           Patient will benefit from skilled therapeutic intervention in order to improve the following deficits and impairments:  Impaired sensation,Decreased strength,Postural dysfunction,Decreased activity tolerance,Abnormal gait,Impaired tone  Visit Diagnosis: Muscle weakness (generalized)  Other abnormalities of gait and mobility  Unsteadiness on feet  Other symptoms and signs involving the nervous system     Problem List Patient Active Problem List   Diagnosis Date Noted  . Stress and adjustment reaction 01/13/2021  . Inattention 10/28/2019  . Hypothyroid 11/13/2016  . Neuropathy involving both lower extremities 11/13/2016  . Cystitis, radiation 05/22/2014  . Cervical cancer  ([place]) 09/01/2013  . Adenocarcinoma of cervix, stage 1 ([place]) 08/26/2013  . Asthma, mild intermittent 07/07/2009  . INSOMNIA 05/28/2008  . GENERALIZED ANXIETY DISORDER 05/07/2008  . HYPERTENSION, BENIGN 05/31/2007   Kerin Perna, PTA 01/28/21 5:32 PM  [place], [place], [address] Phone: [phone]   Fax:  [phone]  Name: Anna Ramsey MRN: [id number] Date of Birth: [birth year]

## 2021-02-04 ENCOUNTER — Ambulatory Visit (INDEPENDENT_AMBULATORY_CARE_PROVIDER_SITE_OTHER): Payer: 59 | Admitting: Physical Therapy

## 2021-02-04 ENCOUNTER — Other Ambulatory Visit: Payer: Self-pay

## 2021-02-04 DIAGNOSIS — R2689 Other abnormalities of gait and mobility: Secondary | ICD-10-CM | POA: Diagnosis not present

## 2021-02-04 DIAGNOSIS — R2681 Unsteadiness on feet: Secondary | ICD-10-CM | POA: Diagnosis not present

## 2021-02-04 DIAGNOSIS — M6281 Muscle weakness (generalized): Secondary | ICD-10-CM

## 2021-02-04 DIAGNOSIS — R29818 Other symptoms and signs involving the nervous system: Secondary | ICD-10-CM

## 2021-02-04 NOTE — Therapy (Signed)
[place], [place], [address] Phone: [phone]   Fax:  [phone]  Physical Therapy Treatment  Patient Details  Name: Anna Ramsey MRN: [id number] Date of Birth: 22-Aug-1966 Referring Provider (PT): Beatrice Lecher, MD   Encounter Date: 02/04/2021   PT End of Session - 02/04/21 1450    Visit Number 3    Number of Visits 12    Date for PT Re-Evaluation 03/09/21    Authorization Type cigna    PT Start Time 1446    PT Stop Time 1533    PT Time Calculation (min) 47 min    Activity Tolerance Patient tolerated treatment well    Behavior During Therapy [facility] for tasks assessed/performed           Past Medical History:  Diagnosis Date  . Dysuria   . Erythematous bladder mucosa   . Heart murmur    asymptomatic per pt --  no echo  . Hematuria   . History of cervical cancer    12/ 2014  Stage IB2--  s/p  TAH w/ BSO and pelvic lymphadectomy/  and radiation therapy  . History of radiation therapy 10/20/13-11/28/13   pelvis 50.4 gray  . Hypertension   . Lesion of bladder   . Mild intermittent asthma   . Neuromuscular disorder ([place])   . Seasonal allergies   . Thyroid disease     Past Surgical History:  Procedure Laterality Date  . CYSTOSCOPY WITH BIOPSY N/A 04/29/2015   Procedure: CYSTOSCOPY WITH BIOPSY WITH FULGERATION;  Surgeon: [name], MD;  Location: [facility];  Service: Urology;  Laterality: N/A;  1 HR [phone] [id number]  . FOOT SURGERY Left 2010  . RADICAL ABDOMINAL HYSTERECTOMY  09-05-2013   w/ BILATERAL SALPINGOOPHORECTOMY AND PELVIC LYMPHADECTOMY    There were no vitals filed for this visit.   Subjective Assessment - 02/04/21 1451    Subjective Pt reports she was sore after aquatic visit; lasted 1 day. She states that she has been trying to work on heel/toe gait pattern with her cane.    Pertinent History cervical cancer with radiation,  h/o R ankle sprain, h/o R ankle fracture, thyroid  disease, fractured tibia L, L ankle fx 11 years ago    Diagnostic tests Impression:  1. The electrophysiologic findings are most suggestive of a L3-4 radiculopathy affecting bilateral lower extremities, worse on the right.  2. There is no evidence of a sensorimotor polyneuropathy affecting the lower extremities.    Currently in Pain? No/denies              [facility] PT Assessment - 02/04/21 0001      Assessment   Medical Diagnosis neuropathy involving both LEs    Referring Provider (PT) Beatrice Lecher, MD    Onset Date/Surgical Date 01/13/21    Hand Dominance Right             Pt seen for aquatic therapy today.  Treatment took place in water 3.25-4 ft in depth at the [facility] pool. Temp of water was 91.  Pt entered/exited the pool via stairs with supervision with bilat rail.  Treatment:   Forward step ups on bottom step in pool with BUE on rails x 10 R, x 10 L (and retro step down).   Holding onto pool edge:   Hip ext x 10 each leg (cues for keeping knee straight), Hip abdct x 12 each leg.  Squats x 10 reps, cues  for weight into toes and not rocking into heels. Heel raises x 15 reps.   Holding onto water walker float:  Forward gait with cues for Rt heel strike and step length.  Side stepping with increased step height.  Single leg stance with hip circles.  Seated on water bench:  LAQ x 12 each leg,  With legs straight- hip abdct/add x 12.     Sit to/from stand with side step Rt/Lt 11 ft x 2 sets.  Weight shifts without allowing knees to lock out.   Trial of gait with aqua jogger barbell - still unsteady - will use walker for now.   Pt requires buoyancy for support and to offload joints with strengthening exercises. Viscosity of the water is needed for resistance of strengthening; water current perturbations provides challenge to standing balance unsupported, requiring increased core activation.     PT Long  Term Goals - 01/26/21 1128      PT LONG TERM GOAL #1   Title The patient will be indep with HEP for LE strengthening, balance and moiblity.    Time 6    Period Weeks    Target Date 03/09/21      PT LONG TERM GOAL #2   Title The patient will improve R LE strength to 4/5 throughout for knee flexion, hip flexion, ankle DF, hip extension and hip abduction.    Time 6    Period Weeks    Target Date 03/09/21      PT LONG TERM GOAL #3   Title The patient will demonstrate ability to squat to the floor and return to standing without pressing through furniture.    Time 6    Period Weeks    Target Date 03/09/21      PT LONG TERM GOAL #4   Title The patient will be further assessed for standing balance and gait speed and goals to follow.    Time 6    Period Weeks    Target Date 03/09/21                 Plan - 02/04/21 1453    Clinical Impression Statement Improved ability to ascend stairs with RLE with less UE assistance than last visit.  Pt demonstrated some difficulty with motor control of RLE, with hip ext at wall.  Improved gait pattern this visit with less cues, while using floating water walker.  Pt reported some tightness in LB after completing LE exercises; reduced with seated work on bench in pool.  Progressing towards goals.    Personal Factors and Comorbidities Time since onset of injury/illness/exacerbation;Comorbidity 3+    Comorbidities cervical cancer with radiation, h/o falls, h/o multiple LE fractures    Examination-Activity Limitations Squat;Stairs;Stand;Lift;Locomotion Level    Examination-Participation Restrictions Occupation;Community Activity    Stability/Clinical Decision Making Stable/Uncomplicated    Rehab Potential Good    PT Frequency 2x / week    PT Duration 6 weeks    PT Treatment/Interventions Aquatic Therapy;ADLs/Self Care Home Management;Patient/family education;Manual techniques;Functional mobility training;Therapeutic activities;Therapeutic  exercise;Balance training;Neuromuscular re-education;Gait training;Stair training;DME Instruction;Orthotic Fit/Training    PT Next Visit Plan progress HEP (keep </= 6 exercises); aquatic activities, LE strengthening, core stability, balance, gait training *on land:  do Berg and gait speed/further gait analysis    PT Home Exercise Plan [email or web address]    Consulted and Agree with Plan of Care Patient           Patient will benefit from skilled therapeutic intervention in order to improve the  following deficits and impairments:  Impaired sensation,Decreased strength,Postural dysfunction,Decreased activity tolerance,Abnormal gait,Impaired tone  Visit Diagnosis: Muscle weakness (generalized)  Other abnormalities of gait and mobility  Unsteadiness on feet  Other symptoms and signs involving the nervous system     Problem List Patient Active Problem List   Diagnosis Date Noted  . Stress and adjustment reaction 01/13/2021  . Inattention 10/28/2019  . Hypothyroid 11/13/2016  . Neuropathy involving both lower extremities 11/13/2016  . Cystitis, radiation 05/22/2014  . Cervical cancer ([place]) 09/01/2013  . Adenocarcinoma of cervix, stage 1 ([place]) 08/26/2013  . Asthma, mild intermittent 07/07/2009  . INSOMNIA 05/28/2008  . GENERALIZED ANXIETY DISORDER 05/07/2008  . HYPERTENSION, BENIGN 05/31/2007    Kerin Perna, PTA 02/04/21 3:40 PM  [facility] [place], [place], [address] Phone: [phone]   Fax:  [phone]  Name: DANASIA BAKER MRN: [id number] Date of Birth: 10/28/1965

## 2021-02-08 ENCOUNTER — Other Ambulatory Visit: Payer: Self-pay

## 2021-02-08 ENCOUNTER — Encounter: Payer: Self-pay | Admitting: Rehabilitative and Restorative Service Providers"

## 2021-02-08 ENCOUNTER — Ambulatory Visit (INDEPENDENT_AMBULATORY_CARE_PROVIDER_SITE_OTHER): Payer: 59 | Admitting: Rehabilitative and Restorative Service Providers"

## 2021-02-08 DIAGNOSIS — R2681 Unsteadiness on feet: Secondary | ICD-10-CM | POA: Diagnosis not present

## 2021-02-08 DIAGNOSIS — R29818 Other symptoms and signs involving the nervous system: Secondary | ICD-10-CM

## 2021-02-08 DIAGNOSIS — M6281 Muscle weakness (generalized): Secondary | ICD-10-CM | POA: Diagnosis not present

## 2021-02-08 DIAGNOSIS — R2689 Other abnormalities of gait and mobility: Secondary | ICD-10-CM

## 2021-02-08 NOTE — Therapy (Signed)
[place], [place], [address] Phone: [phone]   Fax:  [phone]  Physical Therapy Treatment  Patient Details  Name: Anna Ramsey MRN: [id number] Date of Birth: 1966/06/10 Referring Provider (PT): Beatrice Lecher, MD   Encounter Date: 02/08/2021   PT End of Session - 02/08/21 0852    Visit Number 4    Number of Visits 12    Date for PT Re-Evaluation 03/09/21    Authorization Type cigna    PT Start Time 0848    PT Stop Time 0930    PT Time Calculation (min) 42 min    Activity Tolerance Patient tolerated treatment well    Behavior During Therapy [facility] for tasks assessed/performed           Past Medical History:  Diagnosis Date  . Dysuria   . Erythematous bladder mucosa   . Heart murmur    asymptomatic per pt --  no echo  . Hematuria   . History of cervical cancer    12/ 2014  Stage IB2--  s/p  TAH w/ BSO and pelvic lymphadectomy/  and radiation therapy  . History of radiation therapy 10/20/13-11/28/13   pelvis 50.4 gray  . Hypertension   . Lesion of bladder   . Mild intermittent asthma   . Neuromuscular disorder ([place])   . Seasonal allergies   . Thyroid disease     Past Surgical History:  Procedure Laterality Date  . CYSTOSCOPY WITH BIOPSY N/A 04/29/2015   Procedure: CYSTOSCOPY WITH BIOPSY WITH FULGERATION;  Surgeon: [name], MD;  Location: [facility];  Service: Urology;  Laterality: N/A;  1 HR [phone] [id number]  . FOOT SURGERY Left 2010  . RADICAL ABDOMINAL HYSTERECTOMY  09-05-2013   w/ BILATERAL SALPINGOOPHORECTOMY AND PELVIC LYMPHADECTOMY    There were no vitals filed for this visit.   Subjective Assessment - 02/08/21 0849    Subjective The patient overdid this weekend.  She fell yesterday and bruised her R foot and it is sore.    Pertinent History cervical cancer with radiation, h/o R ankle sprain, h/o R ankle fracture, thyroid   disease, fractured tibia L, L ankle fx 11 years ago    Diagnostic tests Impression:  1. The electrophysiologic findings are most suggestive of a L3-4 radiculopathy affecting bilateral lower extremities, worse on the right.  2. There is no evidence of a sensorimotor polyneuropathy affecting the lower extremities.    Patient Stated Goals "what I would love to do is be able to bend down and use my legs to get up"; has used cane 5 years    Currently in Pain? Yes    Pain Score 4     Pain Location Foot    Pain Orientation Right    Pain Type Acute pain    Pain Onset Yesterday    Pain Frequency Constant    Aggravating Factors  walking    Pain Relieving Factors rest              [facility] PT Assessment - 02/08/21 0852      Assessment   Medical Diagnosis neuropathy involving both LEs    Referring Provider (PT) Beatrice Lecher, MD    Onset Date/Surgical Date 01/13/21    Hand Dominance Right                         [facility] Adult PT Treatment/Exercise - 02/08/21 9798  Ambulation/Gait   Ambulation/Gait Yes    Ambulation/Gait Assistance 6: Modified independent (Device/Increase time)    Ambulation Distance (Feet) 120 Feet    Gait Comments The patient slows gait speed to ensure she clears R foot during gait, she also locks R knee into extension to provide stability.  We discussed option of using R AFO to provide more stability in order to reduce falls and improve gait efficiency.  PT to followup on AFO.      Exercises   Exercises Knee/Hip;Lumbar      Lumbar Exercises: Supine   Bridge 10 reps    Bridge Limitations cues to lift higher    Other Supine Lumbar Exercises bridge on physioball; large ball isometrics hip flexion    Other Supine Lumbar Exercises obliques x 5 reps      Lumbar Exercises: Sidelying   Hip Abduction Right;Left   12 reps   Other Sidelying Lumbar Exercises side planks      Lumbar Exercises: Quadruped   Single Arm Raise 10 reps;Right;Left    Single  Arm Raises Limitations with core engagement    Other Quadruped Lumbar Exercises core stability R and L sliding LE posterior and return to start x 10 reps    Other Quadruped Lumbar Exercises alternating trunk rotation x 5 reps R and L sides; hip abduction lifts with knee extended x 10 reps R and L                  PT Education - 02/08/21 0932    Education Details HEP progression    Person(s) Educated Patient    Methods Explanation;Demonstration;Handout    Comprehension Verbalized understanding;Returned demonstration               PT Long Term Goals - 01/26/21 1128      PT LONG TERM GOAL #1   Title The patient will be indep with HEP for LE strengthening, balance and moiblity.    Time 6    Period Weeks    Target Date 03/09/21      PT LONG TERM GOAL #2   Title The patient will improve R LE strength to 4/5 throughout for knee flexion, hip flexion, ankle DF, hip extension and hip abduction.    Time 6    Period Weeks    Target Date 03/09/21      PT LONG TERM GOAL #3   Title The patient will demonstrate ability to squat to the floor and return to standing without pressing through furniture.    Time 6    Period Weeks    Target Date 03/09/21      PT LONG TERM GOAL #4   Title The patient will be further assessed for standing balance and gait speed and goals to follow.    Time 6    Period Weeks    Target Date 03/09/21                 Plan - 02/08/21 0856    Clinical Impression Statement The patient has a foot injury from fall yesterday.  Therefore, PT modified activities today for q-ped and mat to decrease WB through R foot.  Plan to progress to patient tolerance.    PT Treatment/Interventions Aquatic Therapy;ADLs/Self Care Home Management;Patient/family education;Manual techniques;Functional mobility training;Therapeutic activities;Therapeutic exercise;Balance training;Neuromuscular re-education;Gait training;Stair training;DME Instruction;Orthotic Fit/Training     PT Next Visit Plan progress HEP (keep </= 6 exercises); aquatic activities, LE strengthening, core stability, balance, gait training *on land:  do Berg and  gait speed/further gait analysis    PT Home Exercise Plan [email or web address]    Consulted and Agree with Plan of Care Patient           Patient will benefit from skilled therapeutic intervention in order to improve the following deficits and impairments:     Visit Diagnosis: Muscle weakness (generalized)  Other abnormalities of gait and mobility  Unsteadiness on feet  Other symptoms and signs involving the nervous system     Problem List Patient Active Problem List   Diagnosis Date Noted  . Stress and adjustment reaction 01/13/2021  . Inattention 10/28/2019  . Hypothyroid 11/13/2016  . Neuropathy involving both lower extremities 11/13/2016  . Cystitis, radiation 05/22/2014  . Cervical cancer ([place]) 09/01/2013  . Adenocarcinoma of cervix, stage 1 ([place]) 08/26/2013  . Asthma, mild intermittent 07/07/2009  . INSOMNIA 05/28/2008  . GENERALIZED ANXIETY DISORDER 05/07/2008  . HYPERTENSION, BENIGN 05/31/2007    Emrah Ariola, PT 02/08/2021, 3:45 PM  [facility] [place], [place], [address] Phone: [phone]   Fax:  [phone]  Name: LAQUANDA BICK MRN: [id number] Date of Birth: 10/08/1965

## 2021-02-08 NOTE — Patient Instructions (Signed)
Access Code: [email or web address] URL: [email or web address] Date: 02/08/2021 Prepared by: Rudell Cobb  Exercises Prone Knee Flexion AROM - 2 x daily - 7 x weekly - 1 sets - 10 reps Beginner Clam - 2 x daily - 7 x weekly - 1 sets - 10 reps Sidelying Hip Abduction - 2 x daily - 7 x weekly - 1 sets - 10 reps Hooklying Isometric Hip Flexion - 2 x daily - 7 x weekly - 1 sets - 10 reps Bridge - 2 x daily - 7 x weekly - 1 sets - 10 reps Wall Quarter Squat - 2 x daily - 7 x weekly - 1 sets - 10 reps

## 2021-02-11 ENCOUNTER — Ambulatory Visit (INDEPENDENT_AMBULATORY_CARE_PROVIDER_SITE_OTHER): Payer: 59 | Admitting: Physical Therapy

## 2021-02-11 ENCOUNTER — Other Ambulatory Visit: Payer: Self-pay

## 2021-02-11 DIAGNOSIS — M6281 Muscle weakness (generalized): Secondary | ICD-10-CM | POA: Diagnosis not present

## 2021-02-11 DIAGNOSIS — R2689 Other abnormalities of gait and mobility: Secondary | ICD-10-CM

## 2021-02-11 DIAGNOSIS — R2681 Unsteadiness on feet: Secondary | ICD-10-CM

## 2021-02-11 DIAGNOSIS — R29818 Other symptoms and signs involving the nervous system: Secondary | ICD-10-CM

## 2021-02-11 NOTE — Therapy (Signed)
[facility] [place] [place] [place], [place], [address] Phone: [phone]   Fax:  [phone]  Physical Therapy Treatment  Patient Details  Name: Anna Ramsey MRN: [id number] Date of Birth: 05-29-1966 Referring Provider (PT): Beatrice Lecher, MD   Encounter Date: 02/11/2021   PT End of Session - 02/11/21 1513    Visit Number 5    Number of Visits 12    Date for PT Re-Evaluation 03/09/21    Authorization Type cigna    PT Start Time 1508   pt arrived late   PT Stop Time 1548    PT Time Calculation (min) 40 min    Activity Tolerance Patient tolerated treatment well    Behavior During Therapy [facility] for tasks assessed/performed           Past Medical History:  Diagnosis Date  . Dysuria   . Erythematous bladder mucosa   . Heart murmur    asymptomatic per pt --  no echo  . Hematuria   . History of cervical cancer    12/ 2014  Stage IB2--  s/p  TAH w/ BSO and pelvic lymphadectomy/  and radiation therapy  . History of radiation therapy 10/20/13-11/28/13   pelvis 50.4 gray  . Hypertension   . Lesion of bladder   . Mild intermittent asthma   . Neuromuscular disorder ([place])   . Seasonal allergies   . Thyroid disease     Past Surgical History:  Procedure Laterality Date  . CYSTOSCOPY WITH BIOPSY N/A 04/29/2015   Procedure: CYSTOSCOPY WITH BIOPSY WITH FULGERATION;  Surgeon: [name], MD;  Location: [facility];  Service: Urology;  Laterality: N/A;  1 HR [phone] [id number]  . FOOT SURGERY Left 2010  . RADICAL ABDOMINAL HYSTERECTOMY  09-05-2013   w/ BILATERAL SALPINGOOPHORECTOMY AND PELVIC LYMPHADECTOMY    There were no vitals filed for this visit.   Subjective Assessment - 02/11/21 1511    Subjective Pt reports she was not as sore as she thought she would be after last session (on land).  Her Rt foot is more numb today.    Pertinent History cervical cancer with radiation, h/o  R ankle sprain, h/o R ankle fracture, thyroid  disease, fractured tibia L, L ankle fx 11 years ago    Patient Stated Goals "what I would love to do is be able to bend down and use my legs to get up"; has used cane 5 years    Currently in Pain? No/denies    Pain Score 0-No pain           Pt seen for aquatic therapy today.  Treatment took place in water 3.25 depth at the [facility] pool. Temp of water was 91.  Pt entered/exited the pool via stairs with supervision with bilat rail.  Treatment:   Cues for controlled descent with LLE while loading RLE going down stairs with heavy UE on rail.   Holding onto water walker float:  Forward gait with cues for Rt heel strike and step length.  Side stepping with increased step height.  Retro gait.    Forward step ups on bottom step in pool with BUE on rails x 10 R (and retro step down).  Cues for knee alignment, to avoid valgus.   Holding onto pool edge:   Leg swings hip flexion/ ext x 8 each leg (cues for keeping knee straight), Hip abdct x 10 each leg.  Squats x 10 reps. Heel raises x  10 reps.    Seated on water bench:  LAQ x 12 each leg,  Knees to chest after retro gait due to increased low back discomfort.   Sit to/from stand with side step Rt/Lt 11 ft x 2 sets.    Holding onto aqua jogger barbell - much improved from last attempt.  Repeated forward/retro gait and side stepping with aqua jogger barbell.  Cues to narrow base of support with forward gait.   Pt requires buoyancy for support and to offload joints with strengthening exercises. Viscosity of the water is needed for resistance of strengthening; water current perturbations provides challenge to standing balance unsupported, requiring increased core activation.     PT Long Term Goals - 01/26/21 1128      PT LONG TERM GOAL #1   Title The patient will be indep with HEP for LE strengthening, balance and moiblity.    Time 6    Period Weeks    Target Date 03/09/21       PT LONG TERM GOAL #2   Title The patient will improve R LE strength to 4/5 throughout for knee flexion, hip flexion, ankle DF, hip extension and hip abduction.    Time 6    Period Weeks    Target Date 03/09/21      PT LONG TERM GOAL #3   Title The patient will demonstrate ability to squat to the floor and return to standing without pressing through furniture.    Time 6    Period Weeks    Target Date 03/09/21      PT LONG TERM GOAL #4   Title The patient will be further assessed for standing balance and gait speed and goals to follow.    Time 6    Period Weeks    Target Date 03/09/21                 Plan - 02/11/21 1530    Clinical Impression Statement Much improved balance with gait in pool; now able to ambulate with less assistance (moving from water walker to aqua jogger barbell). With barbell as UE support, pt's base of support with gait became much wider.  Challenging to bring legs into a narrower stance. Some buckling of Rt knee noted with attempts at stair ascending/descending.  Cues for avoiding valgus of Rt knee. Making great gains in water. Progressing towards goals.    PT Treatment/Interventions Aquatic Therapy;ADLs/Self Care Home Management;Patient/family education;Manual techniques;Functional mobility training;Therapeutic activities;Therapeutic exercise;Balance training;Neuromuscular re-education;Gait training;Stair training;DME Instruction;Orthotic Fit/Training    PT Next Visit Plan progress HEP (keep </= 6 exercises); aquatic activities, LE strengthening, core stability, balance, gait training *on land:  do Berg and gait speed/further gait analysis    PT Home Exercise Plan [email or web address]    Consulted and Agree with Plan of Care Patient           Patient will benefit from skilled therapeutic intervention in order to improve the following deficits and impairments:     Visit Diagnosis: Muscle weakness (generalized)  Other abnormalities of gait and  mobility  Unsteadiness on feet  Other symptoms and signs involving the nervous system     Problem List Patient Active Problem List   Diagnosis Date Noted  . Stress and adjustment reaction 01/13/2021  . Inattention 10/28/2019  . Hypothyroid 11/13/2016  . Neuropathy involving both lower extremities 11/13/2016  . Cystitis, radiation 05/22/2014  . Cervical cancer ([place]) 09/01/2013  . Adenocarcinoma of cervix, stage 1 ([place]) 08/26/2013  . Asthma, mild  intermittent 07/07/2009  . INSOMNIA 05/28/2008  . GENERALIZED ANXIETY DISORDER 05/07/2008  . HYPERTENSION, BENIGN 05/31/2007   Kerin Perna, PTA 02/11/21 3:48 PM  [facility] [place], [place], [address] Phone: [phone]   Fax:  [phone]  Name: Anna Ramsey MRN: [id number] Date of Birth: 10/15/1965

## 2021-02-14 ENCOUNTER — Telehealth: Payer: Self-pay | Admitting: Rehabilitative and Restorative Service Providers"

## 2021-02-14 DIAGNOSIS — M21371 Foot drop, right foot: Secondary | ICD-10-CM

## 2021-02-14 DIAGNOSIS — M6281 Muscle weakness (generalized): Secondary | ICD-10-CM

## 2021-02-14 NOTE — Telephone Encounter (Signed)
PT called patient in order to discuss AFO.  Thought patient may need face to face visit with  MD to get AFO.  Patient notes her insurance plan does not require this for DME <$1200.  PT plans to get order and fax to [facility] in [place], PT

## 2021-02-14 NOTE — Telephone Encounter (Signed)
Dr. Madilyn Fireman,  Anna Ramsey has been seen in PT for 5 visits.  She has R foot drop, muscle weakness and diminished sensation.  This leads to abnormalities of gait and intermittent tripping/falls.  She will benefit from a R custom ankle foot orthoses to improve gait efficiency and safety.   She requests to be seen by [facility] in [facility] Phone #  [phone].  Thank you, Rudell Cobb, [place] Phone [phone] Fax [phone]

## 2021-02-15 NOTE — Telephone Encounter (Signed)
Okay to place referral to [facility] in [place] for right custom ankle-foot orthosis.  Diagnosis right foot drop and muscle weakness.  Please notify patient when referral has been sent so that she can follow-up with them in a few days.

## 2021-02-16 ENCOUNTER — Ambulatory Visit (INDEPENDENT_AMBULATORY_CARE_PROVIDER_SITE_OTHER): Payer: 59 | Admitting: Rehabilitative and Restorative Service Providers"

## 2021-02-16 ENCOUNTER — Other Ambulatory Visit: Payer: Self-pay

## 2021-02-16 ENCOUNTER — Encounter: Payer: Self-pay | Admitting: Rehabilitative and Restorative Service Providers"

## 2021-02-16 DIAGNOSIS — R2689 Other abnormalities of gait and mobility: Secondary | ICD-10-CM | POA: Diagnosis not present

## 2021-02-16 DIAGNOSIS — R29818 Other symptoms and signs involving the nervous system: Secondary | ICD-10-CM

## 2021-02-16 DIAGNOSIS — M6281 Muscle weakness (generalized): Secondary | ICD-10-CM | POA: Diagnosis not present

## 2021-02-16 DIAGNOSIS — R2681 Unsteadiness on feet: Secondary | ICD-10-CM

## 2021-02-16 MED ORDER — AMBULATORY NON FORMULARY MEDICATION: 0 refills | Status: AC

## 2021-02-16 NOTE — Telephone Encounter (Signed)
Pt advised of order

## 2021-02-16 NOTE — Patient Instructions (Signed)
Access Code: [email or web address] URL: [email or web address] Date: 02/16/2021 Prepared by: Rudell Cobb  Exercises Prone Knee Flexion AROM - 2 x daily - 7 x weekly - 1 sets - 10 reps Beginner Clam - 2 x daily - 7 x weekly - 1 sets - 10 reps Sidelying Hip Abduction - 2 x daily - 7 x weekly - 1 sets - 10 reps Hooklying Isometric Hip Flexion - 2 x daily - 7 x weekly - 1 sets - 10 reps Bridge - 2 x daily - 7 x weekly - 1 sets - 10 reps Wall Quarter Squat - 2 x daily - 7 x weekly - 1 sets - 10 reps Hip Hiking on Step - 2 x daily - 7 x weekly - 1 sets - 10 reps

## 2021-02-16 NOTE — Therapy (Signed)
[place], [place], [address] Phone: [phone]   Fax:  [phone]  Physical Therapy Treatment  Patient Details  Name: ILO Ramsey MRN: [id number] Date of Birth: 1965/11/24 Referring Provider (PT): Beatrice Lecher, MD   Encounter Date: 02/16/2021   PT End of Session - 02/16/21 0806    Visit Number 6    Number of Visits 12    Date for PT Re-Evaluation 03/09/21    Authorization Type cigna    PT Start Time 0804    PT Stop Time 0845    PT Time Calculation (min) 41 min    Activity Tolerance Patient tolerated treatment well    Behavior During Therapy [facility] for tasks assessed/performed           Past Medical History:  Diagnosis Date  . Dysuria   . Erythematous bladder mucosa   . Heart murmur    asymptomatic per pt --  no echo  . Hematuria   . History of cervical cancer    12/ 2014  Stage IB2--  s/p  TAH w/ BSO and pelvic lymphadectomy/  and radiation therapy  . History of radiation therapy 10/20/13-11/28/13   pelvis 50.4 gray  . Hypertension   . Lesion of bladder   . Mild intermittent asthma   . Neuromuscular disorder ([place])   . Seasonal allergies   . Thyroid disease     Past Surgical History:  Procedure Laterality Date  . CYSTOSCOPY WITH BIOPSY N/A 04/29/2015   Procedure: CYSTOSCOPY WITH BIOPSY WITH FULGERATION;  Surgeon: [name], MD;  [facility];  Service: Urology;  Laterality: N/A;  1 HR [phone] [id number]  . FOOT SURGERY Left 2010  . RADICAL ABDOMINAL HYSTERECTOMY  09-05-2013   w/ BILATERAL SALPINGOOPHORECTOMY AND PELVIC LYMPHADECTOMY    There were no vitals filed for this visit.   Subjective Assessment - 02/16/21 0805    Subjective The patient reports her foot is healing well (after a fall).  She feels the foot is harder to roll over during gait -- this is worse with rainy weather.    Pertinent History cervical cancer with  radiation, h/o R ankle sprain, h/o R ankle fracture, thyroid  disease, fractured tibia L, L ankle fx 11 years ago    Diagnostic tests Impression:  1. The electrophysiologic findings are most suggestive of a L3-4 radiculopathy affecting bilateral lower extremities, worse on the right.  2. There is no evidence of a sensorimotor polyneuropathy affecting the lower extremities.    Patient Stated Goals "what I would love to do is be able to bend down and use my legs to get up"; has used cane 5 years    Currently in Pain? No/denies              [facility] PT Assessment - 02/16/21 7564      Assessment   Medical Diagnosis neuropathy involving both LEs    Referring Provider (PT) Beatrice Lecher, MD    Onset Date/Surgical Date 01/13/21                         [facility] Adult PT Treatment/Exercise - 02/16/21 0807      Ambulation/Gait   Ambulation/Gait Yes    Ambulation/Gait Assistance 6: Modified independent (Device/Increase time)    Ambulation Distance (Feet) 240 Feet    Assistive device None;Straight cane    Pre-Gait Activities Used ace wrap to mimic R AFO moving foot  into DF and facilitating dec'd recurvatum.  Discussed need for more supportive shoewear.    Gait Comments Gait is characterized by lateral hip instability, R knee recurvatum, and core weakness.      Exercises   Exercises Knee/Hip;Lumbar      Lumbar Exercises: Aerobic   Nustep L3 x 4 minutes LEs only      Lumbar Exercises: Supine   Bridge 5 reps    Single Leg Bridge 10 reps    Bridge with [employer] Limitations used a ball under the left leg to reduce pressure L LE      Lumbar Exercises: Sidelying   Hip Abduction Right;10 reps    Hip Abduction Limitations using mirror and lifting wiht flexion/extension with visual cues      Knee/Hip Exercises: Standing   Other Standing Knee Exercises Hip hiking and hip depression x 10 reps R and L.  Patient moved from 4" step up to 8" step b/c of her preference to not reach  all the way to floor.  Patient with UE support through railing. When she stepped off of step, her L knee buckled leading to a slow descent to the floor.  Patient was without injury and was able to move floor>sitting on mat with supervision.      Knee/Hip Exercises: Seated   Heel Slides Limitations x 100 ft with cues on technique                       PT Long Term Goals - 01/26/21 1128      PT LONG TERM GOAL #1   Title The patient will be indep with HEP for LE strengthening, balance and moiblity.    Time 6    Period Weeks    Target Date 03/09/21      PT LONG TERM GOAL #2   Title The patient will improve R LE strength to 4/5 throughout for knee flexion, hip flexion, ankle DF, hip extension and hip abduction.    Time 6    Period Weeks    Target Date 03/09/21      PT LONG TERM GOAL #3   Title The patient will demonstrate ability to squat to the floor and return to standing without pressing through furniture.    Time 6    Period Weeks    Target Date 03/09/21      PT LONG TERM GOAL #4   Title The patient will be further assessed for standing balance and gait speed and goals to follow.    Time 6    Period Weeks    Target Date 03/09/21                  Patient will benefit from skilled therapeutic intervention in order to improve the following deficits and impairments:     Visit Diagnosis: Muscle weakness (generalized)  Other abnormalities of gait and mobility  Unsteadiness on feet  Other symptoms and signs involving the nervous system     Problem List Patient Active Problem List   Diagnosis Date Noted  . Stress and adjustment reaction 01/13/2021  . Inattention 10/28/2019  . Hypothyroid 11/13/2016  . Neuropathy involving both lower extremities 11/13/2016  . Cystitis, radiation 05/22/2014  . Cervical cancer ([place]) 09/01/2013  . Adenocarcinoma of cervix, stage 1 ([place]) 08/26/2013  . Asthma, mild intermittent 07/07/2009  . INSOMNIA 05/28/2008  .  GENERALIZED ANXIETY DISORDER 05/07/2008  . HYPERTENSION, BENIGN 05/31/2007    Aadhav Uhlig , PT 02/16/2021, 11:12  [place], [place], [address] Phone: [phone]   Fax:  [phone]  Name: Anna Ramsey MRN: [id number] Date of Birth: November 10, 1965

## 2021-02-18 ENCOUNTER — Other Ambulatory Visit: Payer: Self-pay

## 2021-02-18 ENCOUNTER — Ambulatory Visit (INDEPENDENT_AMBULATORY_CARE_PROVIDER_SITE_OTHER): Payer: 59 | Admitting: Physical Therapy

## 2021-02-18 ENCOUNTER — Encounter: Payer: Self-pay | Admitting: Physical Therapy

## 2021-02-18 DIAGNOSIS — M6281 Muscle weakness (generalized): Secondary | ICD-10-CM

## 2021-02-18 DIAGNOSIS — R29818 Other symptoms and signs involving the nervous system: Secondary | ICD-10-CM

## 2021-02-18 DIAGNOSIS — R2681 Unsteadiness on feet: Secondary | ICD-10-CM

## 2021-02-18 DIAGNOSIS — R2689 Other abnormalities of gait and mobility: Secondary | ICD-10-CM | POA: Diagnosis not present

## 2021-02-18 NOTE — Therapy (Signed)
[place], [place], [address] Phone: [phone]   Fax:  [phone]  Physical Therapy Treatment  Patient Details  Name: Anna Ramsey MRN: [id number] Date of Birth: 1966-06-15 Referring Provider (PT): Beatrice Lecher, MD   Encounter Date: 02/18/2021   PT End of Session - 02/18/21 1419    Visit Number 7    Number of Visits 12    Date for PT Re-Evaluation 03/09/21    Authorization Type cigna    PT Start Time 1402    PT Stop Time 1442    PT Time Calculation (min) 40 min    Activity Tolerance Patient tolerated treatment well    Behavior During Therapy [facility] for tasks assessed/performed           Past Medical History:  Diagnosis Date  . Dysuria   . Erythematous bladder mucosa   . Heart murmur    asymptomatic per pt --  no echo  . Hematuria   . History of cervical cancer    12/ 2014  Stage IB2--  s/p  TAH w/ BSO and pelvic lymphadectomy/  and radiation therapy  . History of radiation therapy 10/20/13-11/28/13   pelvis 50.4 gray  . Hypertension   . Lesion of bladder   . Mild intermittent asthma   . Neuromuscular disorder ([place])   . Seasonal allergies   . Thyroid disease     Past Surgical History:  Procedure Laterality Date  . CYSTOSCOPY WITH BIOPSY N/A 04/29/2015   Procedure: CYSTOSCOPY WITH BIOPSY WITH FULGERATION;  Surgeon: [name], MD;  Location: [facility];  Service: Urology;  Laterality: N/A;  1 HR [phone] [id number]  . FOOT SURGERY Left 2010  . RADICAL ABDOMINAL HYSTERECTOMY  09-05-2013   w/ BILATERAL SALPINGOOPHORECTOMY AND PELVIC LYMPHADECTOMY    There were no vitals filed for this visit.   Subjective Assessment - 02/18/21 1411    Subjective Pt reports she has noticed her legs are gettting stronger; noticable with leg lifts and wall slides.    Pertinent History cervical cancer with radiation, h/o R ankle sprain, h/o R ankle fracture,  thyroid  disease, fractured tibia L, L ankle fx 11 years ago    Patient Stated Goals "what I would love to do is be able to bend down and use my legs to get up"; has used cane 5 years    Currently in Pain? No/denies    Pain Score 0-No pain             Pt seen for aquatic therapy today.  Treatment took place in water 3.25-4 ft in depth at the [employer] pool. Temp of water was 91.  Pt entered/exited the pool via stairs with supervision with bilat rail and step to/step through pattern.  Treatment:   Holding aqua jogger barbell, short forward gait.   Holding onto edge of pool:   (With floatation cuffs on ankles) hamstring curls x 10 (for quad stretch and activation), hip abdct x 5 each leg (challenging), high knee marching.   Split squats with limited depth x 10 each. Heel raises x 10,  Squats x 12.  L Stretch for hamstring and back stretch.   Seated on elevated pool bench:  bilat hip abd/add x 10.   (With floatation cuffs on ankles) LAQ for hamstring activation x 10 each leg.  Then sit to/from stand with side step in between each rep x 10 ft Rt/Lt, and core activation.   Gait-  forward/ backward, and high knee marching holding onto water walker (due to fatigue in LEs, unsteady with barbell)    Pt requires buoyancy for support and to offload joints with strengthening exercises. Viscosity of the water is needed for resistance of strengthening; water current perturbations provides challenge to standing balance unsupported, requiring increased core activation.       PT Long Term Goals - 01/26/21 1128      PT LONG TERM GOAL #1   Title The patient will be indep with HEP for LE strengthening, balance and moiblity.    Time 6    Period Weeks    Target Date 03/09/21      PT LONG TERM GOAL #2   Title The patient will improve R LE strength to 4/5 throughout for knee flexion, hip flexion, ankle DF, hip extension and hip abduction.    Time 6    Period Weeks    Target Date  03/09/21      PT LONG TERM GOAL #3   Title The patient will demonstrate ability to squat to the floor and return to standing without pressing through furniture.    Time 6    Period Weeks    Target Date 03/09/21      PT LONG TERM GOAL #4   Title The patient will be further assessed for standing balance and gait speed and goals to follow.    Time 6    Period Weeks    Target Date 03/09/21                 Plan - 02/18/21 1446    Clinical Impression Statement Improved control of LE with floatation at ankle.  Limited cues required for core engagement. Improved quality of squats.  LEs fatigued with standing/seated exercises resulting in less balance with gait in pool afterwards. Pt used water walker instead of aqua jogger for safety and energy recovery.  Pt making great gains towards goals.    PT Frequency 2x / week    PT Duration 6 weeks    PT Treatment/Interventions Aquatic Therapy;ADLs/Self Care Home Management;Patient/family education;Manual techniques;Functional mobility training;Therapeutic activities;Therapeutic exercise;Balance training;Neuromuscular re-education;Gait training;Stair training;DME Instruction;Orthotic Fit/Training    PT Next Visit Plan progress HEP (keep </= 6 exercises); aquatic activities, LE strengthening, core stability, balance, gait training *on land:  do Berg and gait speed/further gait analysis    PT Home Exercise Plan [email or web address]    Consulted and Agree with Plan of Care Patient           Patient will benefit from skilled therapeutic intervention in order to improve the following deficits and impairments:  Impaired sensation,Decreased strength,Postural dysfunction,Decreased activity tolerance,Abnormal gait,Impaired tone  Visit Diagnosis: Muscle weakness (generalized)  Other abnormalities of gait and mobility  Unsteadiness on feet  Other symptoms and signs involving the nervous system     Problem List Patient Active Problem List   Diagnosis Date  Noted  . Stress and adjustment reaction 01/13/2021  . Inattention 10/28/2019  . Hypothyroid 11/13/2016  . Neuropathy involving both lower extremities 11/13/2016  . Cystitis, radiation 05/22/2014  . Cervical cancer ([place]) 09/01/2013  . Adenocarcinoma of cervix, stage 1 ([place]) 08/26/2013  . Asthma, mild intermittent 07/07/2009  . INSOMNIA 05/28/2008  . GENERALIZED ANXIETY DISORDER 05/07/2008  . HYPERTENSION, BENIGN 05/31/2007   Kerin Perna, PTA 02/18/21 3:46 PM  [facility] [place], [place], [address] Phone: [phone]   Fax:  [phone]  Name: KARYME MCCONATHY MRN:  [id number] Date of Birth: [birth year]

## 2021-02-23 ENCOUNTER — Ambulatory Visit (INDEPENDENT_AMBULATORY_CARE_PROVIDER_SITE_OTHER): Payer: 59 | Admitting: Rehabilitative and Restorative Service Providers"

## 2021-02-23 ENCOUNTER — Other Ambulatory Visit: Payer: Self-pay

## 2021-02-23 DIAGNOSIS — R2689 Other abnormalities of gait and mobility: Secondary | ICD-10-CM

## 2021-02-23 DIAGNOSIS — R2681 Unsteadiness on feet: Secondary | ICD-10-CM

## 2021-02-23 DIAGNOSIS — M6281 Muscle weakness (generalized): Secondary | ICD-10-CM

## 2021-02-23 DIAGNOSIS — M21371 Foot drop, right foot: Secondary | ICD-10-CM

## 2021-02-23 DIAGNOSIS — R29818 Other symptoms and signs involving the nervous system: Secondary | ICD-10-CM

## 2021-02-23 NOTE — Therapy (Signed)
[place], [place], [address] Phone: [phone]   Fax:  [phone]  Physical Therapy Treatment  Patient Details  Name: Anna Ramsey MRN: [id number] Date of Birth: 05-31-1966 Referring Provider (PT): Beatrice Lecher, MD   Encounter Date: 02/23/2021   PT End of Session - 02/23/21 1217    Visit Number 8    Number of Visits 12    Date for PT Re-Evaluation 03/09/21    Authorization Type cigna    PT Start Time 0802    PT Stop Time 0850    PT Time Calculation (min) 48 min    Activity Tolerance Patient tolerated treatment well    Behavior During Therapy [facility] for tasks assessed/performed           Past Medical History:  Diagnosis Date  . Dysuria   . Erythematous bladder mucosa   . Heart murmur    asymptomatic per pt --  no echo  . Hematuria   . History of cervical cancer    12/ 2014  Stage IB2--  s/p  TAH w/ BSO and pelvic lymphadectomy/  and radiation therapy  . History of radiation therapy 10/20/13-11/28/13   pelvis 50.4 gray  . Hypertension   . Lesion of bladder   . Mild intermittent asthma   . Neuromuscular disorder ([place])   . Seasonal allergies   . Thyroid disease     Past Surgical History:  Procedure Laterality Date  . CYSTOSCOPY WITH BIOPSY N/A 04/29/2015   Procedure: CYSTOSCOPY WITH BIOPSY WITH FULGERATION;  Surgeon: [name], MD;  Location: [facility];  Service: Urology;  Laterality: N/A;  1 HR [phone] [id number]  . FOOT SURGERY Left 2010  . RADICAL ABDOMINAL HYSTERECTOMY  09-05-2013   w/ BILATERAL SALPINGOOPHORECTOMY AND PELVIC LYMPHADECTOMY    There were no vitals filed for this visit.   Subjective Assessment - 02/23/21 0803    Subjective The patient reports she was able to walk around in the woods without her cane.   She feels like her balance is significantly better.    Pertinent History cervical cancer with radiation, h/o R  ankle sprain, h/o R ankle fracture, thyroid  disease, fractured tibia L, L ankle fx 11 years ago    Diagnostic tests Impression:  1. The electrophysiologic findings are most suggestive of a L3-4 radiculopathy affecting bilateral lower extremities, worse on the right.  2. There is no evidence of a sensorimotor polyneuropathy affecting the lower extremities.    Patient Stated Goals "what I would love to do is be able to bend down and use my legs to get up"; has used cane 5 years    Currently in Pain? No/denies              [facility] PT Assessment - 02/23/21 0806      Assessment   Medical Diagnosis neuropathy involving both LEs    Referring Provider (PT) Beatrice Lecher, MD    Onset Date/Surgical Date 01/13/21    Hand Dominance Right                         [facility] Adult PT Treatment/Exercise - 02/23/21 0806      Ambulation/Gait   Ambulation/Gait Yes    Ambulation/Gait Assistance 6: Modified independent (Device/Increase time)    Ambulation Distance (Feet) 360 Feet    Assistive device Straight cane   adjusted cane height   Gait Pattern Step-through pattern;Poor foot clearance -  right;Lateral hip instability   slowed pace   Ambulation Surface Level;Indoor      Neuro Re-ed    Neuro Re-ed Details  Tall kneeling to <>heel sitting for hip control/stability, tall kneeling to 1/2 kneeling using physioball for some support; quadriped bird dog for alternating UE/LE x 5 reps.  Balance in 1/2 kneeling position with rhythmic stabilization.      Exercises   Exercises Knee/Hip;Lumbar      Lumbar Exercises: Aerobic   Nustep L5 x 4 minutes LEs only      Lumbar Exercises: Quadruped   Straight Leg Raise 10 reps    Straight Leg Raises Limitations adding knee flexion with hip extension    Other Quadruped Lumbar Exercises lateral glut med lifts R and L sides (L side not able to do as well)                  PT Education - 02/23/21 1216    Education Details HEP progression     Person(s) Educated Patient    Methods Explanation;Demonstration;Handout    Comprehension Verbalized understanding;Returned demonstration               PT Long Term Goals - 01/26/21 1128      PT LONG TERM GOAL #1   Title The patient will be indep with HEP for LE strengthening, balance and moiblity.    Time 6    Period Weeks    Target Date 03/09/21      PT LONG TERM GOAL #2   Title The patient will improve R LE strength to 4/5 throughout for knee flexion, hip flexion, ankle DF, hip extension and hip abduction.    Time 6    Period Weeks    Target Date 03/09/21      PT LONG TERM GOAL #3   Title The patient will demonstrate ability to squat to the floor and return to standing without pressing through furniture.    Time 6    Period Weeks    Target Date 03/09/21      PT LONG TERM GOAL #4   Title The patient will be further assessed for standing balance and gait speed and goals to follow.    Time 6    Period Weeks    Target Date 03/09/21                 Plan - 02/23/21 1216    Clinical Impression Statement The patient is progressing with stability during gait activities.  She has consult scheduled with orthotist for R AFO next week.  PT to continue to progress.  Anticipate will renew x 6 more weeks at end of current plan in order to progress community gait with AFO and continue working on core and LE strengthening.    PT Treatment/Interventions Aquatic Therapy;ADLs/Self Care Home Management;Patient/family education;Manual techniques;Functional mobility training;Therapeutic activities;Therapeutic exercise;Balance training;Neuromuscular re-education;Gait training;Stair training;DME Instruction;Orthotic Fit/Training    PT Next Visit Plan aquatic activities, LE strengthening, core stability, balance, gait training *on land:  do Berg and gait speed/further gait analysis    PT Home Exercise Plan [email or web address]    Consulted and Agree with Plan of Care Patient           Patient  will benefit from skilled therapeutic intervention in order to improve the following deficits and impairments:  Impaired sensation,Decreased strength,Postural dysfunction,Decreased activity tolerance,Abnormal gait,Impaired tone  Visit Diagnosis: Muscle weakness (generalized)  Other abnormalities of gait and mobility  Unsteadiness on feet  Other symptoms and  signs involving the nervous system  Foot drop, right     Problem List Patient Active Problem List   Diagnosis Date Noted  . Stress and adjustment reaction 01/13/2021  . Inattention 10/28/2019  . Hypothyroid 11/13/2016  . Neuropathy involving both lower extremities 11/13/2016  . Cystitis, radiation 05/22/2014  . Cervical cancer ([place]) 09/01/2013  . Adenocarcinoma of cervix, stage 1 ([place]) 08/26/2013  . Asthma, mild intermittent 07/07/2009  . INSOMNIA 05/28/2008  . GENERALIZED ANXIETY DISORDER 05/07/2008  . HYPERTENSION, BENIGN 05/31/2007    Makenzy Krist, PT 02/23/2021, 12:25 PM  [facility] [place], [place], [address] Phone: [phone]   Fax:  [phone]  Name: VENNELA JUTTE MRN: [id number] Date of Birth: 1966/07/21

## 2021-02-23 NOTE — Patient Instructions (Signed)
Access Code: [email or web address] URL: [email or web address] Date: 02/23/2021 Prepared by: Rudell Cobb  Exercises Prone Knee Flexion AROM - 2 x daily - 7 x weekly - 1 sets - 10 reps Beginner Clam - 2 x daily - 7 x weekly - 1 sets - 10 reps Sidelying Hip Abduction - 2 x daily - 7 x weekly - 1 sets - 10 reps Bridge - 2 x daily - 7 x weekly - 1 sets - 10 reps Wall Quarter Squat - 2 x daily - 7 x weekly - 1 sets - 10 reps Hip Hiking on Step - 2 x daily - 7 x weekly - 1 sets - 10 reps Tall Kneeling Hip Hinge - 2 x daily - 7 x weekly - 1 sets - 10 reps Bird Dog - 2 x daily - 7 x weekly - 1 sets - 10 reps

## 2021-02-25 ENCOUNTER — Ambulatory Visit (INDEPENDENT_AMBULATORY_CARE_PROVIDER_SITE_OTHER): Payer: 59 | Admitting: Physical Therapy

## 2021-02-25 ENCOUNTER — Encounter: Payer: Self-pay | Admitting: Physical Therapy

## 2021-02-25 ENCOUNTER — Other Ambulatory Visit: Payer: Self-pay

## 2021-02-25 DIAGNOSIS — R29818 Other symptoms and signs involving the nervous system: Secondary | ICD-10-CM | POA: Diagnosis not present

## 2021-02-25 DIAGNOSIS — M6281 Muscle weakness (generalized): Secondary | ICD-10-CM | POA: Diagnosis not present

## 2021-02-25 DIAGNOSIS — R2681 Unsteadiness on feet: Secondary | ICD-10-CM

## 2021-02-25 DIAGNOSIS — R2689 Other abnormalities of gait and mobility: Secondary | ICD-10-CM

## 2021-02-25 NOTE — Therapy (Signed)
[place], [place], [address] Phone: [phone]   Fax:  [phone]  Physical Therapy Treatment  Patient Details  Name: Anna Ramsey MRN: [id number] Date of Birth: 07-23-1966 Referring Provider (PT): Beatrice Lecher, MD   Encounter Date: 02/25/2021   PT End of Session - 02/25/21 1510    Visit Number 9    Number of Visits 12    Date for PT Re-Evaluation 03/09/21    Authorization Type cigna    PT Start Time 1445    PT Stop Time 1530    PT Time Calculation (min) 45 min    Activity Tolerance Patient tolerated treatment well    Behavior During Therapy [facility] for tasks assessed/performed           Past Medical History:  Diagnosis Date  . Dysuria   . Erythematous bladder mucosa   . Heart murmur    asymptomatic per pt --  no echo  . Hematuria   . History of cervical cancer    12/ 2014  Stage IB2--  s/p  TAH w/ BSO and pelvic lymphadectomy/  and radiation therapy  . History of radiation therapy 10/20/13-11/28/13   pelvis 50.4 gray  . Hypertension   . Lesion of bladder   . Mild intermittent asthma   . Neuromuscular disorder ([place])   . Seasonal allergies   . Thyroid disease     Past Surgical History:  Procedure Laterality Date  . CYSTOSCOPY WITH BIOPSY N/A 04/29/2015   Procedure: CYSTOSCOPY WITH BIOPSY WITH FULGERATION;  Surgeon: [name], MD;  Location: [facility];  Service: Urology;  Laterality: N/A;  1 HR [phone] [id number]  . FOOT SURGERY Left 2010  . RADICAL ABDOMINAL HYSTERECTOMY  09-05-2013   w/ BILATERAL SALPINGOOPHORECTOMY AND PELVIC LYMPHADECTOMY    There were no vitals filed for this visit.   Subjective Assessment - 02/25/21 1511    Subjective Pt reports she feels her LEs are getting stronger.  Her feet "feel dead today".  She reports improvement in her standing balance. She is voicing interest in indoor cycling "for strengthening hips."   She reports she was able to crawl on knees in bed for first time at home last night.    Pertinent History cervical cancer with radiation, h/o R ankle sprain, h/o R ankle fracture, thyroid  disease, fractured tibia L, L ankle fx 11 years ago    Diagnostic tests Impression:  1. The electrophysiologic findings are most suggestive of a L3-4 radiculopathy affecting bilateral lower extremities, worse on the right.  2. There is no evidence of a sensorimotor polyneuropathy affecting the lower extremities.    Patient Stated Goals "what I would love to do is be able to bend down and use my legs to get up"; has used cane 5 years    Currently in Pain? No/denies    Pain Score 0-No pain           Pt seen for aquatic therapy today.  Treatment took place in water 3.25-4 ft in depth at the [employer] pool. Temp of water was 91.  Pt entered/exited the pool via stairs independently with bilat rail and step through/to pattern.  Treatment:   Holding aqua jogger barbell:  Forward gait, side stepping, and retro gait.  High knee marching.   Holding onto edge of pool:   (With floatation cuffs on ankles) hamstring curls x 10 (for quad stretch and activation), hip abdct x 8 reps each  side (RLE fatigues quickly.   Heel/toe raises x 12.  Squats x 5   Seated on elevated pool bench:   (With floatation cuffs on ankles) LAQ for hamstring activation x 10 each leg. bilat hip abd/add x 15  Then sit to/from stand with side step in between each rep x 12 ft Rt/Lt, and core activation.   Stairs with UE support and reciprocal pattern - up forward, down backward (5 steps, 2 sets)   Pt requires buoyancy for support and to offload joints with strengthening exercises. Viscosity of the water is needed for resistance of strengthening; water current perturbations provides challenge to standing balance unsupported, requiring increased core activation.     PT Long Term Goals - 01/26/21 1128      PT LONG TERM  GOAL #1   Title The patient will be indep with HEP for LE strengthening, balance and moiblity.    Time 6    Period Weeks    Target Date 03/09/21      PT LONG TERM GOAL #2   Title The patient will improve R LE strength to 4/5 throughout for knee flexion, hip flexion, ankle DF, hip extension and hip abduction.    Time 6    Period Weeks    Target Date 03/09/21      PT LONG TERM GOAL #3   Title The patient will demonstrate ability to squat to the floor and return to standing without pressing through furniture.    Time 6    Period Weeks    Target Date 03/09/21      PT LONG TERM GOAL #4   Title The patient will be further assessed for standing balance and gait speed and goals to follow.    Time 6    Period Weeks    Target Date 03/09/21                 Plan - 02/25/21 1530    Clinical Impression Statement Pt demonstrating greater ease with stairs.  Continued difficulty with balance with high knee marching due to decreased balance in Rt stance, and with Rt hip abdct due to early fatigue in RLE.  Pt tolerated all exercises well, requiring minimal support from floatation barbell.  Progressing well towards goals.    PT Frequency 2x / week    PT Duration 6 weeks    PT Treatment/Interventions Aquatic Therapy;ADLs/Self Care Home Management;Patient/family education;Manual techniques;Functional mobility training;Therapeutic activities;Therapeutic exercise;Balance training;Neuromuscular re-education;Gait training;Stair training;DME Instruction;Orthotic Fit/Training    PT Next Visit Plan aquatic activities, LE strengthening, core stability, balance, gait training *on land:  do Berg and gait speed/further gait analysis.  Assess LE strength.    PT Home Exercise Plan [email or web address]    Consulted and Agree with Plan of Care Patient           Patient will benefit from skilled therapeutic intervention in order to improve the following deficits and impairments:  Impaired sensation,Decreased  strength,Postural dysfunction,Decreased activity tolerance,Abnormal gait,Impaired tone  Visit Diagnosis: Muscle weakness (generalized)  Other abnormalities of gait and mobility  Unsteadiness on feet  Other symptoms and signs involving the nervous system     Problem List Patient Active Problem List   Diagnosis Date Noted  . Stress and adjustment reaction 01/13/2021  . Inattention 10/28/2019  . Hypothyroid 11/13/2016  . Neuropathy involving both lower extremities 11/13/2016  . Cystitis, radiation 05/22/2014  . Cervical cancer ([place]) 09/01/2013  . Adenocarcinoma of cervix, stage 1 ([place]) 08/26/2013  . Asthma, mild intermittent 07/07/2009  .  INSOMNIA 05/28/2008  . GENERALIZED ANXIETY DISORDER 05/07/2008  . HYPERTENSION, BENIGN 05/31/2007   Kerin Perna, PTA 02/25/21 3:39 PM  [facility] [place], [place], [address] Phone: [phone]   Fax:  [phone]  Name: Anna Ramsey MRN: [id number] Date of Birth: [birth year]

## 2021-03-01 ENCOUNTER — Encounter: Payer: Self-pay | Admitting: Family Medicine

## 2021-03-02 ENCOUNTER — Ambulatory Visit (INDEPENDENT_AMBULATORY_CARE_PROVIDER_SITE_OTHER): Payer: 59 | Admitting: Rehabilitative and Restorative Service Providers"

## 2021-03-02 DIAGNOSIS — M21371 Foot drop, right foot: Secondary | ICD-10-CM

## 2021-03-02 DIAGNOSIS — R2681 Unsteadiness on feet: Secondary | ICD-10-CM

## 2021-03-02 DIAGNOSIS — R29818 Other symptoms and signs involving the nervous system: Secondary | ICD-10-CM

## 2021-03-02 DIAGNOSIS — M6281 Muscle weakness (generalized): Secondary | ICD-10-CM | POA: Diagnosis not present

## 2021-03-02 DIAGNOSIS — R2689 Other abnormalities of gait and mobility: Secondary | ICD-10-CM

## 2021-03-02 NOTE — Therapy (Signed)
[place], [place], [address] Phone: [phone]   Fax:  [phone]  Physical Therapy Treatment  Patient Details  Name: Anna Ramsey MRN: [id number] Date of Birth: 10-27-1965 Referring Provider (PT): Beatrice Lecher, MD   Encounter Date: 03/02/2021   PT End of Session - 03/02/21 0809    Visit Number 10    Number of Visits 12    Date for PT Re-Evaluation 03/09/21    Authorization Type cigna    PT Start Time 0805    PT Stop Time 0850    PT Time Calculation (min) 45 min    Activity Tolerance Patient tolerated treatment well    Behavior During Therapy [facility] for tasks assessed/performed           Past Medical History:  Diagnosis Date  . Dysuria   . Erythematous bladder mucosa   . Heart murmur    asymptomatic per pt --  no echo  . Hematuria   . History of cervical cancer    12/ 2014  Stage IB2--  s/p  TAH w/ BSO and pelvic lymphadectomy/  and radiation therapy  . History of radiation therapy 10/20/13-11/28/13   pelvis 50.4 gray  . Hypertension   . Lesion of bladder   . Mild intermittent asthma   . Neuromuscular disorder ([place])   . Seasonal allergies   . Thyroid disease     Past Surgical History:  Procedure Laterality Date  . CYSTOSCOPY WITH BIOPSY N/A 04/29/2015   Procedure: CYSTOSCOPY WITH BIOPSY WITH FULGERATION;  Surgeon: [name], MD;  Location: [facility];  Service: Urology;  Laterality: N/A;  1 HR [phone] [id number]  . FOOT SURGERY Left 2010  . RADICAL ABDOMINAL HYSTERECTOMY  09-05-2013   w/ BILATERAL SALPINGOOPHORECTOMY AND PELVIC LYMPHADECTOMY    There were no vitals filed for this visit.   Subjective Assessment - 03/02/21 0920    Subjective The patient reports she can tell she is getting stronger in her hips.  She ordered a stationery bike for home conditioning.    Pertinent History cervical cancer with radiation, h/o R ankle  sprain, h/o R ankle fracture, thyroid  disease, fractured tibia L, L ankle fx 11 years ago    Diagnostic tests Impression:  1. The electrophysiologic findings are most suggestive of a L3-4 radiculopathy affecting bilateral lower extremities, worse on the right.  2. There is no evidence of a sensorimotor polyneuropathy affecting the lower extremities.    Patient Stated Goals "what I would love to do is be able to bend down and use my legs to get up"; has used cane 5 years    Currently in Pain? No/denies              [facility] PT Assessment - 03/02/21 0818      Assessment   Medical Diagnosis neuropathy involving both LEs    Referring Provider (PT) Beatrice Lecher, MD    Onset Date/Surgical Date 01/13/21      Strength   Right Hip Flexion 3/5    Right Hip Extension 3+/5    Right Hip ABduction 4/5    Left Hip Flexion 4/5    Left Hip Extension 3+/5    Left Hip ABduction 5/5    Right Knee Flexion 4-/5    Right Knee Extension 4/5    Left Knee Flexion 5/5    Left Knee Extension 5/5    Right Ankle Dorsiflexion 4-/5    Left Ankle Dorsiflexion  4/5      Ambulation/Gait   Assistive device Straight cane    Gait velocity 2.12 ft/sec      Standardized Balance Assessment   Standardized Balance Assessment Berg Balance Test      Berg Balance Test   Sit to Stand Able to stand  independently using hands    Standing Unsupported Able to stand safely 2 minutes    Sitting with Back Unsupported but Feet Supported on Floor or Stool Able to sit safely and securely 2 minutes    Stand to Sit Sits safely with minimal use of hands    Transfers Able to transfer safely, definite need of hands    Standing Unsupported with Eyes Closed Able to stand 10 seconds safely    Standing Unsupported with Feet Together Needs help to attain position but able to stand for 30 seconds with feet together    From Standing, Reach Forward with Outstretched Arm Can reach forward >5 cm safely (2")    From Standing Position,  Pick up Object from Floor Able to pick up shoe, needs supervision    From Standing Position, Turn to Look Behind Over each Shoulder Turn sideways only but maintains balance    Turn 360 Degrees Able to turn 360 degrees safely but slowly    Standing Unsupported, Alternately Place Feet on Step/Stool Able to complete >2 steps/needs minimal assist    Standing Unsupported, One Foot in Front Able to take small step independently and hold 30 seconds    Standing on One Leg Tries to lift leg/unable to hold 3 seconds but remains standing independently    Total Score 36    Berg comment: 36/56                         [facility] Adult PT Treatment/Exercise - 03/02/21 0818      Ambulation/Gait   Ambulation/Gait Yes    Ambulation/Gait Assistance 6: Modified independent (Device/Increase time)    Gait Comments No device x 40 feet x 2 with improved hip stability noted.      Exercises   Exercises Knee/Hip;Lumbar;Other Exercises    Other Exercises  The patient performs ankle DF initially with assistance and then is able to hold against gravity and then withstand minimal resistance.  PT providing facilitation due to dec'd sensory input.      Lumbar Exercises: Aerobic   Nustep L5 x 4 minutes      Lumbar Exercises: Seated   Sit to Stand 5 reps    Other Seated Lumbar Exercises seated marching      Lumbar Exercises: Supine   Bridge Limitations *attempted bridge with ball squeeze    Straight Leg Raise 5 reps    Straight Leg Raises Limitations unable to perform without assist, performed with foot ER for VMO contraction    Other Supine Lumbar Exercises short arc quad x 10 reps    Other Supine Lumbar Exercises hip flexion on/off mat table; hip ab/adduction in hooklying x 5 reps      Lumbar Exercises: Sidelying   Hip Abduction 5 reps      Knee/Hip Exercises: Supine   Knee Flexion Limitations hip flexion on/off of mat x10 reps; supine marching with R leg off edge of mat to engage hip flexors     Other Supine Knee/Hip Exercises hooklying performing hip ab/adductor lift x 10 reps    Other Supine Knee/Hip Exercises resisted isometric hip IR/ER  PT Long Term Goals - 03/02/21 0836      PT LONG TERM GOAL #1   Title The patient will be indep with HEP for LE strengthening, balance and moiblity.    Time 6    Period Weeks      PT LONG TERM GOAL #2   Title The patient will improve R LE strength to 4/5 throughout for knee flexion, hip flexion, ankle DF, hip extension and hip abduction.    Baseline *hip flexion remains 3/5    Time 6    Period Weeks    Status Partially Met      PT LONG TERM GOAL #3   Title The patient will demonstrate ability to squat to the floor and return to standing without pressing through furniture.    Time 6    Period Weeks      PT LONG TERM GOAL #4   Title The patient will be further assessed for standing balance and gait speed and goals to follow.    Baseline Berg=36/56 and gait speed 2.12 ft/sec    Time 6    Period Weeks    Status Achieved                 Plan - 03/02/21 1002    Clinical Impression Statement The patient demonstrating improved gait speed and control.  She still gets occasional foot drop and is getting fit for AFO today [facility].  PT to continue to progress to LTGs anticipating renewal next week x 6 more weeks.    PT Treatment/Interventions Aquatic Therapy;ADLs/Self Care Home Management;Patient/family education;Manual techniques;Functional mobility training;Therapeutic activities;Therapeutic exercise;Balance training;Neuromuscular re-education;Gait training;Stair training;DME Instruction;Orthotic Fit/Training    PT Next Visit Plan aquatic activities, LE strengthening, core stability, balance, gait training *on land: focus on hip flexor strength, standing in strike rocking to engage ankles and standing weight shifting    PT Home Exercise Plan [email or web address]    Consulted and Agree with Plan of Care  Patient           Patient will benefit from skilled therapeutic intervention in order to improve the following deficits and impairments:     Visit Diagnosis: Muscle weakness (generalized)  Other abnormalities of gait and mobility  Unsteadiness on feet  Other symptoms and signs involving the nervous system  Foot drop, right     Problem List Patient Active Problem List   Diagnosis Date Noted  . Stress and adjustment reaction 01/13/2021  . Inattention 10/28/2019  . Hypothyroid 11/13/2016  . Neuropathy involving both lower extremities 11/13/2016  . Cystitis, radiation 05/22/2014  . Cervical cancer ([place]) 09/01/2013  . Adenocarcinoma of cervix, stage 1 ([place]) 08/26/2013  . Asthma, mild intermittent 07/07/2009  . INSOMNIA 05/28/2008  . GENERALIZED ANXIETY DISORDER 05/07/2008  . HYPERTENSION, BENIGN 05/31/2007    Bettina Warn, PT 03/02/2021, 10:04 AM  [facility] [place], [place], [address] Phone: [phone]   Fax:  [phone]  Name: SYBLE PICCO MRN: [id number] Date of Birth: 05/10/1966

## 2021-03-04 ENCOUNTER — Other Ambulatory Visit: Payer: Self-pay

## 2021-03-04 ENCOUNTER — Ambulatory Visit (INDEPENDENT_AMBULATORY_CARE_PROVIDER_SITE_OTHER): Payer: 59 | Admitting: Physical Therapy

## 2021-03-04 DIAGNOSIS — R2689 Other abnormalities of gait and mobility: Secondary | ICD-10-CM | POA: Diagnosis not present

## 2021-03-04 DIAGNOSIS — M6281 Muscle weakness (generalized): Secondary | ICD-10-CM

## 2021-03-04 DIAGNOSIS — R2681 Unsteadiness on feet: Secondary | ICD-10-CM

## 2021-03-04 DIAGNOSIS — R29818 Other symptoms and signs involving the nervous system: Secondary | ICD-10-CM

## 2021-03-04 NOTE — Therapy (Addendum)
[place], [place], [address] Phone: [phone]   Fax:  [phone]  Physical Therapy Treatment  Patient Details  Name: Anna Ramsey MRN: [id number] Date of Birth: 12-Jun-1966 Referring Provider (PT): Beatrice Lecher, MD   Encounter Date: 03/04/2021   PT End of Session - 03/04/21 1458     Visit Number 11    Number of Visits 12    Date for PT Re-Evaluation 03/09/21    Authorization Type cigna    PT Start Time 1445    PT Stop Time 3244    PT Time Calculation (min) 45 min    Activity Tolerance Patient tolerated treatment well    Behavior During Therapy [facility] for tasks assessed/performed             Past Medical History:  Diagnosis Date   Dysuria    Erythematous bladder mucosa    Heart murmur    asymptomatic per pt --  no echo   Hematuria    History of cervical cancer    12/ 2014  Stage IB2--  s/p  TAH w/ BSO and pelvic lymphadectomy/  and radiation therapy   History of radiation therapy 10/20/13-11/28/13   pelvis 50.4 gray   Hypertension    Lesion of bladder    Mild intermittent asthma    Neuromuscular disorder ([place])    Seasonal allergies    Thyroid disease     Past Surgical History:  Procedure Laterality Date   CYSTOSCOPY WITH BIOPSY N/A 04/29/2015   Procedure: CYSTOSCOPY WITH BIOPSY WITH FULGERATION;  Surgeon: [name], MD;  Location: [facility];  Service: Urology;  Laterality: N/A;  1 HR [phone] [id number]   FOOT SURGERY Left 2010   RADICAL ABDOMINAL HYSTERECTOMY  09-05-2013   w/ BILATERAL SALPINGOOPHORECTOMY AND PELVIC LYMPHADECTOMY    There were no vitals filed for this visit.   Subjective Assessment - 03/04/21 1500     Subjective Pt reports she bough a stationary bike for home.  She has rode it for up to 10 min.  She reports she woke up this morning with NO swelling in her foot for first time in a long time. She states also has  a little more sensation in her foot. She has been fitted for AFO, unsure when she will receive it.    Pertinent History cervical cancer with radiation, h/o R ankle sprain, h/o R ankle fracture, thyroid  disease, fractured tibia L, L ankle fx 11 years ago    Diagnostic tests Impression:  1. The electrophysiologic findings are most suggestive of a L3-4 radiculopathy affecting bilateral lower extremities, worse on the right.  2. There is no evidence of a sensorimotor polyneuropathy affecting the lower extremities.    Patient Stated Goals "what I would love to do is be able to bend down and use my legs to get up"; has used cane 5 years    Currently in Pain? No/denies    Pain Score 0-No pain            Pt seen for aquatic therapy today.  Treatment took place in water 3.25-4 ft in depth at the [employer] pool. Temp of water was 91.  Pt entered/exited the pool via stairs step to pattern with bilat rail.  Treatment:   Holding aqua jogger barbell:  Forward and backward gait.  Light prancing/forward slow jog. Beginning skip.    Holding onto edge of pool:  hip ext x 10 each leg.  Hip abdct x 10 reps each side. Summo squats x 10. Split squats x 10 each leg.    (With floatation cuffs on ankles) hamstring curls x 10 (for quad stretch and activation), Hip flexion x 10 each. Front float with flutter kick (due to leg kick pattern, pt moves backwards when doing this).    Seated on elevated pool bench:   (With floatation cuffs on ankles) LAQ for hamstring activation x 10 each leg. bilat hip abd/add x 8 reps   (Without cuffs) Then sit to/from stand with side step in between each rep x 12 ft Rt/Lt, and core activation.    Without support:  side stepping and forward walking.   Stairs with UE support and reciprocal pattern - up forward, down forward (5 steps, 1 set- Rt knee buckles when descending)     Pt requires buoyancy for support and to offload joints with strengthening exercises.  Viscosity of the water is needed for resistance of strengthening; water current perturbations provides challenge to standing balance unsupported, requiring increased core activation.      PT Long Term Goals - 03/02/21 0836       PT LONG TERM GOAL #1   Title The patient will be indep with HEP for LE strengthening, balance and moiblity.    Time 6    Period Weeks      PT LONG TERM GOAL #2   Title The patient will improve R LE strength to 4/5 throughout for knee flexion, hip flexion, ankle DF, hip extension and hip abduction.    Baseline *hip flexion remains 3/5    Time 6    Period Weeks    Status Partially Met      PT LONG TERM GOAL #3   Title The patient will demonstrate ability to squat to the floor and return to standing without pressing through furniture.    Time 6    Period Weeks      PT LONG TERM GOAL #4   Title The patient will be further assessed for standing balance and gait speed and goals to follow.    Baseline Berg=36/56 and gait speed 2.12 ft/sec    Time 6    Period Weeks    Status Achieved                   Plan - 03/04/21 1503     Clinical Impression Statement Gait with aqua jogger barbell improving in quality each visit. During visit pt was able to ambulate in water without any UE support.  When attempting trial of front flutter kick, pt's body moves backwards through water due to LLE movement pattern.  Continued functional weakness in Rt quad noted with buckling of Rt knee when descending stairs (with UE on B rails.).  Pt making good gains towards LTGs.    Comorbidities cervical cancer with radiation, h/o falls, h/o multiple LE fractures    Rehab Potential Good    PT Frequency 2x / week    PT Duration 6 weeks    PT Treatment/Interventions Aquatic Therapy;ADLs/Self Care Home Management;Patient/family education;Manual techniques;Functional mobility training;Therapeutic activities;Therapeutic exercise;Balance training;Neuromuscular re-education;Gait  training;Stair training;DME Instruction;Orthotic Fit/Training    PT Next Visit Plan aquatic activities, LE strengthening, core stability, balance, gait training *on land: focus on hip flexor strength, standing in strike rocking to engage ankles and standing weight shifting.  END OF POC - review goals and complete renewal.    PT Home Exercise Plan [id number]    Consulted and Agree with Plan  of Care Patient             Patient will benefit from skilled therapeutic intervention in order to improve the following deficits and impairments:  Impaired sensation, Decreased strength, Postural dysfunction, Decreased activity tolerance, Abnormal gait, Impaired tone  Visit Diagnosis: Muscle weakness (generalized)  Other abnormalities of gait and mobility  Unsteadiness on feet  Other symptoms and signs involving the nervous system     Problem List Patient Active Problem List   Diagnosis Date Noted   Stress and adjustment reaction 01/13/2021   Inattention 10/28/2019   Hypothyroid 11/13/2016   Neuropathy involving both lower extremities 11/13/2016   Cystitis, radiation 05/22/2014   Cervical cancer ([place]) 09/01/2013   Adenocarcinoma of cervix, stage 1 ([place]) 08/26/2013   Asthma, mild intermittent 07/07/2009   INSOMNIA 05/28/2008   GENERALIZED ANXIETY DISORDER 05/07/2008   HYPERTENSION, BENIGN 05/31/2007   Kerin Perna, PTA 03/04/21 4:28 PM   [place], [place], [address] Phone: [phone]   Fax:  [phone]  Name: Anna Ramsey MRN: [id number] Date of Birth: October 26, 1965

## 2021-03-09 ENCOUNTER — Ambulatory Visit (INDEPENDENT_AMBULATORY_CARE_PROVIDER_SITE_OTHER): Payer: 59 | Admitting: Rehabilitative and Restorative Service Providers"

## 2021-03-09 ENCOUNTER — Other Ambulatory Visit: Payer: Self-pay

## 2021-03-09 DIAGNOSIS — R2681 Unsteadiness on feet: Secondary | ICD-10-CM

## 2021-03-09 DIAGNOSIS — R2689 Other abnormalities of gait and mobility: Secondary | ICD-10-CM | POA: Diagnosis not present

## 2021-03-09 DIAGNOSIS — M6281 Muscle weakness (generalized): Secondary | ICD-10-CM

## 2021-03-09 DIAGNOSIS — M21371 Foot drop, right foot: Secondary | ICD-10-CM

## 2021-03-09 DIAGNOSIS — R29818 Other symptoms and signs involving the nervous system: Secondary | ICD-10-CM

## 2021-03-09 NOTE — Therapy (Signed)
[place], [place], [address] Phone: [phone]   Fax:  [phone]  Physical Therapy Treatment and Renewal Summary  Patient Details  Name: Anna Ramsey MRN: [id number] Date of Birth: [birth year] Referring Provider (PT): Beatrice Lecher, MD   Encounter Date: 03/09/2021   PT End of Session - 03/09/21 1122     Visit Number 12    Number of Visits 12    Date for PT Re-Evaluation 03/09/21    Authorization Type cigna    PT Start Time 0801    PT Stop Time 0843    PT Time Calculation (min) 42 min    Activity Tolerance Patient tolerated treatment well    Behavior During Therapy [facility] for tasks assessed/performed             Past Medical History:  Diagnosis Date   Dysuria    Erythematous bladder mucosa    Heart murmur    asymptomatic per pt --  no echo   Hematuria    History of cervical cancer    12/ 2014  Stage IB2--  s/p  TAH w/ BSO and pelvic lymphadectomy/  and radiation therapy   History of radiation therapy 10/20/13-11/28/13   pelvis 50.4 gray   Hypertension    Lesion of bladder    Mild intermittent asthma    Neuromuscular disorder ([place])    Seasonal allergies    Thyroid disease     Past Surgical History:  Procedure Laterality Date   CYSTOSCOPY WITH BIOPSY N/A 04/29/2015   Procedure: CYSTOSCOPY WITH BIOPSY WITH FULGERATION;  Surgeon: [name], MD;  Location: [facility];  Service: Urology;  Laterality: N/A;  1 HR [phone] [id number]   FOOT SURGERY Left 2010   RADICAL ABDOMINAL HYSTERECTOMY  09-05-2013   w/ BILATERAL SALPINGOOPHORECTOMY AND PELVIC LYMPHADECTOMY    There were no vitals filed for this visit.   Subjective Assessment - 03/09/21 0806     Subjective The patient got AFO (spry step) fit last week and it should arrive in next 3 weeks.  She also ordered a recumbent bike and began riding last week.    Pertinent History cervical  cancer with radiation, h/o R ankle sprain, h/o R ankle fracture, thyroid  disease, fractured tibia L, L ankle fx 11 years ago    Diagnostic tests Impression:  1. The electrophysiologic findings are most suggestive of a L3-4 radiculopathy affecting bilateral lower extremities, worse on the right.  2. There is no evidence of a sensorimotor polyneuropathy affecting the lower extremities.    Patient Stated Goals "what I would love to do is be able to bend down and use my legs to get up"; has used cane 5 years    Currently in Pain? No/denies                [facility] PT Assessment - 03/09/21 6803       Assessment   Medical Diagnosis neuropathy involving both LEs    Referring Provider (PT) Beatrice Lecher, MD    Onset Date/Surgical Date 01/13/21    Hand Dominance Right                           [facility] Adult PT Treatment/Exercise - 03/09/21 0808       Ambulation/Gait   Ambulation/Gait Yes    Ambulation/Gait Assistance 6: Modified independent (Device/Increase time)    Assistive device Straight cane  Exercises   Exercises Knee/Hip;Lumbar      Lumbar Exercises: Supine   Other Supine Lumbar Exercises hip flexion on/off mat table      Lumbar Exercises: Quadruped   Opposite Arm/Leg Raise Right arm/Left leg;Left arm/Right leg;5 reps    Opposite Arm/Leg Raise Limitations added physioball for balance support    Other Quadruped Lumbar Exercises hip hinge into abduction 10 reps R and L side    Other Quadruped Lumbar Exercises primal push up with ball for support x 5 reps      Knee/Hip Exercises: Aerobic   Recumbent Bike level 1 x 5 minutes      Knee/Hip Exercises: Standing   Heel Raises Both;10 reps    Wall Squat 10 reps    Wall Squat Limitations with PT guarding R LE to prevent buckling    Other Standing Knee Exercises sit<>stand to chair back x 10 reps dec'ing UE support      Knee/Hip Exercises: Seated   Long Arc Quad Strengthening;Right;5 reps;2 sets    [employer] Weight 2 lbs.    Heel Slides Strengthening;Right    Heel Slides Limitations with green band    Other Seated Knee/Hip Exercises ankle DF and eversion in sitting AROM    Marching 10 reps                         PT Long Term Goals - 03/09/21 0817       PT LONG TERM GOAL #1   Title The patient will be indep with HEP for LE strengthening, balance and moiblity.    Time 6    Period Weeks    Status Achieved      PT LONG TERM GOAL #2   Title The patient will improve R LE strength to 4/5 throughout for knee flexion, hip flexion, ankle DF, hip extension and hip abduction.    Baseline *hip flexion remains 3/5    Time 6    Period Weeks    Status Partially Met      PT LONG TERM GOAL #3   Title The patient will demonstrate ability to squat to the floor and return to standing without pressing through furniture.    Baseline has to keep legs locked and walk up her legs    Time 6    Period Weeks    Status Not Met      PT LONG TERM GOAL #4   Title The patient will be further assessed for standing balance and gait speed and goals to follow.    Baseline Berg=36/56 and gait speed 2.12 ft/sec    Time 6    Period Weeks    Status Achieved              UPDATED LONG TERM GOALS:    PT Long Term Goals - 03/09/21 1126       PT LONG TERM GOAL #1   Title The patient will be indep with progression of  HEP for LE strengthening, balance and moiblity.    Time 6    Period Weeks    Status Revised    Target Date 04/20/21      PT LONG TERM GOAL #2   Title The patient will improve R LE strength to 4/5 for R hip flexion    Time 6    Period Weeks    Status Revised    Target Date 04/20/21      PT LONG TERM GOAL #  3   Title The patient will demonstrate ability to squat to the floor and return to standing without pressing through furniture.    Baseline has to keep legs locked and walk up her legs    Time 6    Period Weeks    Status Revised    Target Date 04/20/21       PT LONG TERM GOAL #4   Title The patient will improve Berg balance score from 36/56 to > or equal to 42/56 to demo improving steady state balance.    Time 6    Period Weeks    Status New    Target Date 04/20/21      PT LONG TERM GOAL #5   Title The patient will increase gait speed from 2.12 ft/sec to > or equal to 2.62 ft/sec to demo transition to "full community ambulator" classification of gait.    Time 6    Period Weeks    Status New    Target Date 04/20/21                Plan - 03/09/21 1123     Clinical Impression Statement The patient has partially met LTGs.  She is walking with improved stability.  She demonstrated improvement in MMT strength and reports greater functional strength.  She continues with R foot drop with gait, weakness in R hip flexors and instability with gait and dynamic balance tasks.  PT to continue to progress to patient tolerance renewing x 6 more weeks for PT.    PT Frequency 2x / week    PT Duration 6 weeks    PT Treatment/Interventions Aquatic Therapy;ADLs/Self Care Home Management;Patient/family education;Manual techniques;Functional mobility training;Therapeutic activities;Therapeutic exercise;Balance training;Neuromuscular re-education;Gait training;Stair training;DME Instruction;Orthotic Fit/Training;Taping    PT Next Visit Plan aquatic activities, LE strengthening, core stability, balance, gait training *on land: focus on hip flexor strength, standing in strike rocking to engage ankles and standing weight shifting    PT Home Exercise Plan [email or web address]    Consulted and Agree with Plan of Care Patient             Patient will benefit from skilled therapeutic intervention in order to improve the following deficits and impairments:  Impaired sensation, Decreased strength, Postural dysfunction, Decreased activity tolerance, Abnormal gait, Impaired tone  Visit Diagnosis: Muscle weakness (generalized)  Other abnormalities of gait and  mobility  Unsteadiness on feet  Other symptoms and signs involving the nervous system  Foot drop, right     Problem List Patient Active Problem List   Diagnosis Date Noted   Stress and adjustment reaction 01/13/2021   Inattention 10/28/2019   Hypothyroid 11/13/2016   Neuropathy involving both lower extremities 11/13/2016   Cystitis, radiation 05/22/2014   Cervical cancer ([place]) 09/01/2013   Adenocarcinoma of cervix, stage 1 ([place]) 08/26/2013   Asthma, mild intermittent 07/07/2009   INSOMNIA 05/28/2008   GENERALIZED ANXIETY DISORDER 05/07/2008   HYPERTENSION, BENIGN 05/31/2007   Florinda Taflinger, PT   Chena Chohan 03/09/2021, 11:25 AM  [facility] [place] [address] [place], [place], [address] Phone: [phone]   Fax:  [phone]  Name: Anna Ramsey MRN: [id number] Date of Birth: [birth year]

## 2021-03-11 ENCOUNTER — Ambulatory Visit (INDEPENDENT_AMBULATORY_CARE_PROVIDER_SITE_OTHER): Payer: 59 | Admitting: Rehabilitative and Restorative Service Providers"

## 2021-03-11 ENCOUNTER — Other Ambulatory Visit: Payer: Self-pay

## 2021-03-11 DIAGNOSIS — R29818 Other symptoms and signs involving the nervous system: Secondary | ICD-10-CM

## 2021-03-11 DIAGNOSIS — M6281 Muscle weakness (generalized): Secondary | ICD-10-CM | POA: Diagnosis not present

## 2021-03-11 DIAGNOSIS — M21371 Foot drop, right foot: Secondary | ICD-10-CM

## 2021-03-11 DIAGNOSIS — R2681 Unsteadiness on feet: Secondary | ICD-10-CM | POA: Diagnosis not present

## 2021-03-11 DIAGNOSIS — R2689 Other abnormalities of gait and mobility: Secondary | ICD-10-CM

## 2021-03-11 NOTE — Therapy (Signed)
[place], [place], [address] Phone: [phone]   Fax:  [phone]  Physical Therapy Treatment  Patient Details  Name: Anna Ramsey MRN: [id number] Date of Birth: [birth year] Referring Provider (PT): Beatrice Lecher, MD   Encounter Date: 03/11/2021   PT End of Session - 03/11/21 1553     Visit Number 12    Number of Visits 24    Date for PT Re-Evaluation 04/20/21    Authorization Type cigna    PT Start Time 0277    PT Stop Time 1614    PT Time Calculation (min) 43 min    Activity Tolerance Patient tolerated treatment well    Behavior During Therapy [facility] for tasks assessed/performed             Past Medical History:  Diagnosis Date   Dysuria    Erythematous bladder mucosa    Heart murmur    asymptomatic per pt --  no echo   Hematuria    History of cervical cancer    12/ 2014  Stage IB2--  s/p  TAH w/ BSO and pelvic lymphadectomy/  and radiation therapy   History of radiation therapy 10/20/13-11/28/13   pelvis 50.4 gray   Hypertension    Lesion of bladder    Mild intermittent asthma    Neuromuscular disorder ([place])    Seasonal allergies    Thyroid disease     Past Surgical History:  Procedure Laterality Date   CYSTOSCOPY WITH BIOPSY N/A 04/29/2015   Procedure: CYSTOSCOPY WITH BIOPSY WITH FULGERATION;  Surgeon: [name], MD;  Location: [facility];  Service: Urology;  Laterality: N/A;  1 HR [phone] [id number]   FOOT SURGERY Left 2010   RADICAL ABDOMINAL HYSTERECTOMY  09-05-2013   w/ BILATERAL SALPINGOOPHORECTOMY AND PELVIC LYMPHADECTOMY    There were no vitals filed for this visit.   Subjective Assessment - 03/11/21 1534     Subjective The patient reports she was not sore after last session.    Pertinent History cervical cancer with radiation, h/o R ankle sprain, h/o R ankle fracture, thyroid  disease, fractured tibia L, L ankle fx  11 years ago    Diagnostic tests Impression:  1. The electrophysiologic findings are most suggestive of a L3-4 radiculopathy affecting bilateral lower extremities, worse on the right.  2. There is no evidence of a sensorimotor polyneuropathy affecting the lower extremities.    Patient Stated Goals "what I would love to do is be able to bend down and use my legs to get up"; has used cane 5 years    Currently in Pain? No/denies                [facility] PT Assessment - 03/11/21 1554       Assessment   Medical Diagnosis neuropathy involving both LEs    Referring Provider (PT) Beatrice Lecher, MD    Onset Date/Surgical Date 01/13/21    Hand Dominance Right                           [facility] Adult PT Treatment/Exercise - 03/11/21 1554       Neuro Re-ed    Neuro Re-ed Details  Standing marching with UE support x 10 reps R and L, standing with feet in stride rocking ant/posteriorly working on heel/toe raises and weight shift through plantar surface of feet      Exercises   Exercises  Knee/Hip;Lumbar    Other Exercises  seated ankle DF, tightness in ankle with passive DF and overpressure      Lumbar Exercises: Seated   Hip Flexion on Ball 10 reps    Hip Flexion on Ball Limitations on physiodisc    Sit to Stand 5 reps    Sit to Stand Limitations with one leg behind      Lumbar Exercises: Quadruped   Other Quadruped Lumbar Exercises primal push up with physioball; plank wal kbacks to quadriped without ball                         PT Long Term Goals - 03/09/21 1126       PT LONG TERM GOAL #1   Title The patient will be indep with progression of  HEP for LE strengthening, balance and moiblity.    Time 6    Period Weeks    Status Revised    Target Date 04/20/21      PT LONG TERM GOAL #2   Title The patient will improve R LE strength to 4/5 for R hip flexion    Time 6    Period Weeks    Status Revised    Target Date 04/20/21      PT LONG TERM  GOAL #3   Title The patient will demonstrate ability to squat to the floor and return to standing without pressing through furniture.    Baseline has to keep legs locked and walk up her legs    Time 6    Period Weeks    Status Revised    Target Date 04/20/21      PT LONG TERM GOAL #4   Title The patient will improve Berg balance score from 36/56 to > or equal to 42/56 to demo improving steady state balance.    Time 6    Period Weeks    Status New    Target Date 04/20/21      PT LONG TERM GOAL #5   Title The patient will increase gait speed from 2.12 ft/sec to > or equal to 2.62 ft/sec to demo transition to "full community ambulator" classification of gait.    Time 6    Period Weeks    Status New    Target Date 04/20/21                   Plan - 03/11/21 1625     Clinical Impression Statement The patient is progressing with LE strength.  R hip flexors are weak with seated marching and core musculature compensates for weakness.  Patient is demonstrating improved ankle DF.    PT Treatment/Interventions Aquatic Therapy;ADLs/Self Care Home Management;Patient/family education;Manual techniques;Functional mobility training;Therapeutic activities;Therapeutic exercise;Balance training;Neuromuscular re-education;Gait training;Stair training;DME Instruction;Orthotic Fit/Training;Taping    PT Next Visit Plan aquatic activities, LE strengthening, core stability, balance, gait training *on land: focus on hip flexor strength, standing in strike rocking to engage ankles and standing weight shifting    PT Home Exercise Plan [id number]    Consulted and Agree with Plan of Care Patient             Patient will benefit from skilled therapeutic intervention in order to improve the following deficits and impairments:     Visit Diagnosis: Muscle weakness (generalized)  Other abnormalities of gait and mobility  Unsteadiness on feet  Other symptoms and signs involving the nervous  system  Foot drop, right     Problem  List Patient Active Problem List   Diagnosis Date Noted   Stress and adjustment reaction 01/13/2021   Inattention 10/28/2019   Hypothyroid 11/13/2016   Neuropathy involving both lower extremities 11/13/2016   Cystitis, radiation 05/22/2014   Cervical cancer ([place]) 09/01/2013   Adenocarcinoma of cervix, stage 1 ([place]) 08/26/2013   Asthma, mild intermittent 07/07/2009   INSOMNIA 05/28/2008   GENERALIZED ANXIETY DISORDER 05/07/2008   HYPERTENSION, BENIGN 05/31/2007    Lilas Diefendorf, PT 03/11/2021, 4:28 PM  [facility] [place], [place], [address] Phone: [phone]   Fax:  [phone]  Name: KYNISHA MEMON MRN: [id number] Date of Birth: Feb 12, 1966

## 2021-03-14 ENCOUNTER — Ambulatory Visit: Payer: 59 | Admitting: Psychology

## 2021-03-15 ENCOUNTER — Encounter: Payer: 59 | Admitting: Physical Therapy

## 2021-03-18 ENCOUNTER — Encounter: Payer: Self-pay | Admitting: Physical Therapy

## 2021-03-18 ENCOUNTER — Ambulatory Visit (INDEPENDENT_AMBULATORY_CARE_PROVIDER_SITE_OTHER): Payer: 59 | Admitting: Physical Therapy

## 2021-03-18 ENCOUNTER — Other Ambulatory Visit: Payer: Self-pay

## 2021-03-18 DIAGNOSIS — R2681 Unsteadiness on feet: Secondary | ICD-10-CM | POA: Diagnosis not present

## 2021-03-18 DIAGNOSIS — R2689 Other abnormalities of gait and mobility: Secondary | ICD-10-CM

## 2021-03-18 DIAGNOSIS — M6281 Muscle weakness (generalized): Secondary | ICD-10-CM | POA: Diagnosis not present

## 2021-03-18 DIAGNOSIS — R29818 Other symptoms and signs involving the nervous system: Secondary | ICD-10-CM

## 2021-03-18 NOTE — Therapy (Signed)
[place], [place], [address] Phone: [phone]   Fax:  [phone]  Physical Therapy Treatment  Patient Details  Name: Anna Ramsey MRN: [id number] Date of Birth: 1965/12/28 Referring Provider (PT): Beatrice Lecher, MD   Encounter Date: 03/18/2021   PT End of Session - 03/18/21 1247     Visit Number 13    Number of Visits 24    Date for PT Re-Evaluation 04/20/21    Authorization Type cigna    PT Start Time 1231    PT Stop Time 1309    PT Time Calculation (min) 38 min    Activity Tolerance Patient tolerated treatment well    Behavior During Therapy [facility] for tasks assessed/performed             Past Medical History:  Diagnosis Date   Dysuria    Erythematous bladder mucosa    Heart murmur    asymptomatic per pt --  no echo   Hematuria    History of cervical cancer    12/ 2014  Stage IB2--  s/p  TAH w/ BSO and pelvic lymphadectomy/  and radiation therapy   History of radiation therapy 10/20/13-11/28/13   pelvis 50.4 gray   Hypertension    Lesion of bladder    Mild intermittent asthma    Neuromuscular disorder ([place])    Seasonal allergies    Thyroid disease     Past Surgical History:  Procedure Laterality Date   CYSTOSCOPY WITH BIOPSY N/A 04/29/2015   Procedure: CYSTOSCOPY WITH BIOPSY WITH FULGERATION;  Surgeon: [name], MD;  Location: [facility];  Service: Urology;  Laterality: N/A;  1 HR [id number]   FOOT SURGERY Left 2010   RADICAL ABDOMINAL HYSTERECTOMY  09-05-2013   w/ BILATERAL SALPINGOOPHORECTOMY AND PELVIC LYMPHADECTOMY    There were no vitals filed for this visit.   Subjective Assessment - 03/18/21 1246     Subjective Pt reports she has not had any falls over the last week.  She has been walking outside (in [place] area) without cane.  She noticed that using her RLE to push her back on swing really worked her Rt hip  flexor. She has not received AFO yet.    Pertinent History cervical cancer with radiation, h/o R ankle sprain, h/o R ankle fracture, thyroid  disease, fractured tibia L, L ankle fx 11 years ago    Diagnostic tests Impression:  1. The electrophysiologic findings are most suggestive of a L3-4 radiculopathy affecting bilateral lower extremities, worse on the right.  2. There is no evidence of a sensorimotor polyneuropathy affecting the lower extremities.    Patient Stated Goals "what I would love to do is be able to bend down and use my legs to get up"; has used cane 5 years    Currently in Pain? No/denies    Pain Score 0-No pain              Pt seen for aquatic therapy today.  Treatment took place in water 3.25-4 ft in depth at the [facility] pool. Temp of water was 91.  Pt entered/exited the pool via stairs with supervision with bilat rail.  Treatment:   Gait holding onto aqua jogger barbell: Forward gait with focus on heel strike and avoiding hip abdct with Rt heel strike.   Holding onto wall:   (With 3# ankle wt on Rt ankle) - Rt hip abdct x10, Rt hip ext x 10,  Rt hip flexion (knee flexion) x 10, Rt hip circles. Side stepping- R/Lt.  (Without ankle wt):  Rt leg step ups onto 4" step (in water depth to sternum) x 10. Tandem gait forward/ backward with cues for narrowing the base of support.   Heel/toe raises x 15 reps  Without UE support:    Sit to/from stand with bilat hip flexion (for core work) and side step at bench in water.  SLS - Rt/Lt 15-30 sec, x 3 each, with/without horiz head turns, with hand skulling to help maintain balance.   Seated on bench with 1.5 and 3# ankle wt on RLE - 10 LAQ with each wt.   Pt requires buoyancy for support to joints with strengthening exercises. Viscosity of the water is needed for resistance of strengthening; water current perturbations provides challenge to standing balance unsupported, requiring increased core activation.    PT  Long Term Goals - 03/09/21 1126       PT LONG TERM GOAL #1   Title The patient will be indep with progression of  HEP for LE strengthening, balance and moiblity.    Time 6    Period Weeks    Status Revised    Target Date 04/20/21      PT LONG TERM GOAL #2   Title The patient will improve R LE strength to 4/5 for R hip flexion    Time 6    Period Weeks    Status Revised    Target Date 04/20/21      PT LONG TERM GOAL #3   Title The patient will demonstrate ability to squat to the floor and return to standing without pressing through furniture.    Baseline has to keep legs locked and walk up her legs    Time 6    Period Weeks    Status Revised    Target Date 04/20/21      PT LONG TERM GOAL #4   Title The patient will improve Berg balance score from 36/56 to > or equal to 42/56 to demo improving steady state balance.    Time 6    Period Weeks    Status New    Target Date 04/20/21      PT LONG TERM GOAL #5   Title The patient will increase gait speed from 2.12 ft/sec to > or equal to 2.62 ft/sec to demo transition to "full community ambulator" classification of gait.    Time 6    Period Weeks    Status New    Target Date 04/20/21                   Plan - 03/18/21 1308     Clinical Impression Statement Trialed ankle wt in pool on RLE with seated, standing exercises with good tolerance.  Pt reported she could sense the extra weight at hip, but couldn't feel the ankle wt touching her skin.  Forward step ups on 4" step in chest-deep water went well, with good form and improved control.  Pt progressing well towards LTGs with gradually improving gait quality and RLE strength.    PT Frequency 2x / week    PT Duration 6 weeks    PT Treatment/Interventions Aquatic Therapy;ADLs/Self Care Home Management;Patient/family education;Manual techniques;Functional mobility training;Therapeutic activities;Therapeutic exercise;Balance training;Neuromuscular re-education;Gait  training;Stair training;DME Instruction;Orthotic Fit/Training;Taping    PT Next Visit Plan aquatic activities, LE strengthening, core stability, balance, gait training *on land: focus on hip flexor strength, standing in strike rocking to  engage ankles and standing weight shifting    PT Home Exercise Plan [email or web address]    Consulted and Agree with Plan of Care Patient             Patient will benefit from skilled therapeutic intervention in order to improve the following deficits and impairments:  Impaired sensation, Decreased strength, Postural dysfunction, Decreased activity tolerance, Abnormal gait, Impaired tone  Visit Diagnosis: Muscle weakness (generalized)  Other abnormalities of gait and mobility  Unsteadiness on feet  Other symptoms and signs involving the nervous system     Problem List Patient Active Problem List   Diagnosis Date Noted   Stress and adjustment reaction 01/13/2021   Inattention 10/28/2019   Hypothyroid 11/13/2016   Neuropathy involving both lower extremities 11/13/2016   Cystitis, radiation 05/22/2014   Cervical cancer ([place]) 09/01/2013   Adenocarcinoma of cervix, 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

## 2021-03-23 ENCOUNTER — Ambulatory Visit (INDEPENDENT_AMBULATORY_CARE_PROVIDER_SITE_OTHER): Payer: 59 | Admitting: Rehabilitative and Restorative Service Providers"

## 2021-03-23 ENCOUNTER — Other Ambulatory Visit: Payer: Self-pay

## 2021-03-23 DIAGNOSIS — R2681 Unsteadiness on feet: Secondary | ICD-10-CM | POA: Diagnosis not present

## 2021-03-23 DIAGNOSIS — R29818 Other symptoms and signs involving the nervous system: Secondary | ICD-10-CM

## 2021-03-23 DIAGNOSIS — R2689 Other abnormalities of gait and mobility: Secondary | ICD-10-CM

## 2021-03-23 DIAGNOSIS — M6281 Muscle weakness (generalized): Secondary | ICD-10-CM | POA: Diagnosis not present

## 2021-03-23 DIAGNOSIS — M21371 Foot drop, right foot: Secondary | ICD-10-CM

## 2021-03-23 NOTE — Patient Instructions (Signed)
Access Code: [email or web address] URL: [email or web address] Date: 03/23/2021 Prepared by: Rudell Cobb  Exercises Bird Dog - 2 x daily - 7 x weekly - 1 sets - 10 reps Prone Knee Flexion AROM - 2 x daily - 7 x weekly - 1 sets - 10 reps Sidelying Hip Abduction - 2 x daily - 7 x weekly - 1 sets - 10 reps Bridge - 2 x daily - 7 x weekly - 1 sets - 10 reps Supine March - 2 x daily - 7 x weekly - 1 sets - 10 reps Hip Hiking on Step - 2 x daily - 7 x weekly - 1 sets - 10 reps Sit to Stand with Arm Reach Toward Target - 2 x daily - 7 x weekly - 1 sets - 10 reps

## 2021-03-23 NOTE — Therapy (Signed)
[place], [place], [address] Phone: [phone]   Fax:  [phone]  Physical Therapy Treatment  Patient Details  Name: Anna Ramsey MRN: [id number] Date of Birth: 1965/11/18 Referring Provider (PT): Beatrice Lecher, MD   Encounter Date: 03/23/2021   PT End of Session - 03/23/21 0846     Visit Number 14    Number of Visits 24    Date for PT Re-Evaluation 04/20/21    Authorization Type cigna    PT Start Time 0801    PT Stop Time 0843    PT Time Calculation (min) 42 min    Activity Tolerance Patient tolerated treatment well    Behavior During Therapy [facility] for tasks assessed/performed             Past Medical History:  Diagnosis Date   Dysuria    Erythematous bladder mucosa    Heart murmur    asymptomatic per pt --  no echo   Hematuria    History of cervical cancer    12/ 2014  Stage IB2--  s/p  TAH w/ BSO and pelvic lymphadectomy/  and radiation therapy   History of radiation therapy 10/20/13-11/28/13   pelvis 50.4 gray   Hypertension    Lesion of bladder    Mild intermittent asthma    Neuromuscular disorder ([place])    Seasonal allergies    Thyroid disease     Past Surgical History:  Procedure Laterality Date   CYSTOSCOPY WITH BIOPSY N/A 04/29/2015   Procedure: CYSTOSCOPY WITH BIOPSY WITH FULGERATION;  Surgeon: [name], MD;  Location: [facility];  Service: Urology;  Laterality: N/A;  1 HR [phone] [id number]   FOOT SURGERY Left 2010   RADICAL ABDOMINAL HYSTERECTOMY  09-05-2013   w/ BILATERAL SALPINGOOPHORECTOMY AND PELVIC LYMPHADECTOMY    There were no vitals filed for this visit.   Subjective Assessment - 03/23/21 0802     Subjective The patient notes she is increasing her tolerance to riding the bike at home.  She is tolerating the bike without foot issues/pain.    Pertinent History cervical cancer with radiation, h/o R ankle  sprain, h/o R ankle fracture, thyroid  disease, fractured tibia L, L ankle fx 11 years ago    Diagnostic tests Impression:  1. The electrophysiologic findings are most suggestive of a L3-4 radiculopathy affecting bilateral lower extremities, worse on the right.  2. There is no evidence of a sensorimotor polyneuropathy affecting the lower extremities.    Patient Stated Goals "what I would love to do is be able to bend down and use my legs to get up"; has used cane 5 years    Currently in Pain? No/denies                [facility] PT Assessment - 03/23/21 5956       Assessment   Medical Diagnosis neuropathy involving both LEs    Referring Provider (PT) Beatrice Lecher, MD    Onset Date/Surgical Date 01/13/21                           [facility] Adult PT Treatment/Exercise - 03/23/21 3875       Ambulation/Gait   Ambulation/Gait Yes    Ambulation/Gait Assistance 6: Modified independent (Device/Increase time)    Assistive device Straight cane    Gait Comments cane walking into/out of clinic; emphasized cane use at home after quad exercises due  to fall risk      Therapeutic Activites    Therapeutic Activities Other Therapeutic Activities    Other Therapeutic Activities floor<>stand working on tall kneel to 1/2 kneel to stand using UEs for support x 4 reps R and L sides      Neuro Re-ed    Neuro Re-ed Details  quadriped activities moving into 1/2 kneeling and tall kneeling with dec'ing UE support in static 1/2 kneel position; performed standing to 1/2 kneel and return to stand with UE support      Exercises   Exercises Knee/Hip;Lumbar      Lumbar Exercises: Standing   Functional Squats 10 reps    Functional Squats Limitations 1/2 sits and 1/2 stands working on quad control had a chair nearby for support      Lumbar Exercises: Supine   Bridge --   reviewed from HEP-- still needed   Other Supine Lumbar Exercises supine marching x 5 reps R and L; supine 90/90 core/ toe  touches x 4 reps R and L sides    Other Supine Lumbar Exercises hip flexion on and off mat x 3 reps with assist, tried with a box to reduce ROM of motion      Lumbar Exercises: Quadruped   Straight Leg Raise 5 reps    Straight Leg Raises Limitations with knee flexion    Opposite Arm/Leg Raise 5 reps    Opposite Arm/Leg Raise Limitations with physioball    Other Quadruped Lumbar Exercises quadriped to plank x 3 reps x 10 seconds                    PT Education - 03/23/21 0845     Education Details modified HEP    Person(s) Educated Patient    Methods Explanation;Demonstration;Handout    Comprehension Verbalized understanding                 PT Long Term Goals - 03/09/21 1126       PT LONG TERM GOAL #1   Title The patient will be indep with progression of  HEP for LE strengthening, balance and moiblity.    Time 6    Period Weeks    Status Revised    Target Date 04/20/21      PT LONG TERM GOAL #2   Title The patient will improve R LE strength to 4/5 for R hip flexion    Time 6    Period Weeks    Status Revised    Target Date 04/20/21      PT LONG TERM GOAL #3   Title The patient will demonstrate ability to squat to the floor and return to standing without pressing through furniture.    Baseline has to keep legs locked and walk up her legs    Time 6    Period Weeks    Status Revised    Target Date 04/20/21      PT LONG TERM GOAL #4   Title The patient will improve Berg balance score from 36/56 to > or equal to 42/56 to demo improving steady state balance.    Time 6    Period Weeks    Status New    Target Date 04/20/21      PT LONG TERM GOAL #5   Title The patient will increase gait speed from 2.12 ft/sec to > or equal to 2.62 ft/sec to demo transition to "full community ambulator" classification of gait.    Time 6  Period Weeks    Status New    Target Date 04/20/21                   Plan - 03/23/21 0940     Clinical Impression  Statement The patient is continuing to progress with LE strengthening.  PT focusing on quad and hip strengthening in order to improve functional strength for daily activities.    PT Treatment/Interventions Aquatic Therapy;ADLs/Self Care Home Management;Patient/family education;Manual techniques;Functional mobility training;Therapeutic activities;Therapeutic exercise;Balance training;Neuromuscular re-education;Gait training;Stair training;DME Instruction;Orthotic Fit/Training;Taping    PT Next Visit Plan aquatic activities, LE strengthening, core stability, balance, gait training *on land: focus on hip flexor strength, standing in stride rocking to engage ankles and standing weight shifting    PT Home Exercise Plan [email or web address]    Consulted and Agree with Plan of Care Patient             Patient will benefit from skilled therapeutic intervention in order to improve the following deficits and impairments:     Visit Diagnosis: Muscle weakness (generalized)  Other abnormalities of gait and mobility  Unsteadiness on feet  Other symptoms and signs involving the nervous system  Foot drop, right     Problem List Patient Active Problem List   Diagnosis Date Noted   Stress and adjustment reaction 01/13/2021   Inattention 10/28/2019   Hypothyroid 11/13/2016   Neuropathy involving both lower extremities 11/13/2016   Cystitis, radiation 05/22/2014   Cervical cancer ([place]) 09/01/2013   Adenocarcinoma of cervix, stage 1 ([place]) 08/26/2013   Asthma, mild intermittent 07/07/2009   INSOMNIA 05/28/2008   GENERALIZED ANXIETY DISORDER 05/07/2008   HYPERTENSION, BENIGN 05/31/2007    Sherrian Nunnelley, PT 03/23/2021, 9:49 AM  [facility] [place], [place], [address] Phone: [phone]   Fax:  [phone]  Name: Anna Ramsey MRN: [id number] Date of Birth: [birth year]

## 2021-03-25 ENCOUNTER — Encounter: Payer: Self-pay | Admitting: Physical Therapy

## 2021-03-25 ENCOUNTER — Ambulatory Visit (INDEPENDENT_AMBULATORY_CARE_PROVIDER_SITE_OTHER): Payer: 59 | Admitting: Physical Therapy

## 2021-03-25 ENCOUNTER — Other Ambulatory Visit: Payer: Self-pay

## 2021-03-25 DIAGNOSIS — R2689 Other abnormalities of gait and mobility: Secondary | ICD-10-CM | POA: Diagnosis not present

## 2021-03-25 DIAGNOSIS — R2681 Unsteadiness on feet: Secondary | ICD-10-CM | POA: Diagnosis not present

## 2021-03-25 DIAGNOSIS — M6281 Muscle weakness (generalized): Secondary | ICD-10-CM | POA: Diagnosis not present

## 2021-03-25 NOTE — Therapy (Signed)
[place], [place], [address] Phone: [phone]   Fax:  [phone]  Physical Therapy Treatment  Patient Details  Name: RUSSIE GULLEDGE MRN: [id number] Date of Birth: 1966-08-04 Referring Provider (PT): Beatrice Lecher, MD   Encounter Date: 03/25/2021   PT End of Session - 03/25/21 1454     Visit Number 15    Number of Visits 24    Date for PT Re-Evaluation 04/20/21    Authorization Type cigna    PT Start Time 1445    PT Stop Time 1525    PT Time Calculation (min) 40 min    Activity Tolerance Patient tolerated treatment well    Behavior During Therapy [facility] for tasks assessed/performed             Past Medical History:  Diagnosis Date   Dysuria    Erythematous bladder mucosa    Heart murmur    asymptomatic per pt --  no echo   Hematuria    History of cervical cancer    12/ 2014  Stage IB2--  s/p  TAH w/ BSO and pelvic lymphadectomy/  and radiation therapy   History of radiation therapy 10/20/13-11/28/13   pelvis 50.4 gray   Hypertension    Lesion of bladder    Mild intermittent asthma    Neuromuscular disorder ([place])    Seasonal allergies    Thyroid disease     Past Surgical History:  Procedure Laterality Date   CYSTOSCOPY WITH BIOPSY N/A 04/29/2015   Procedure: CYSTOSCOPY WITH BIOPSY WITH FULGERATION;  Surgeon: [name], MD;  Location: [facility];  Service: Urology;  Laterality: N/A;  1 HR [phone] [id number]   FOOT SURGERY Left 2010   RADICAL ABDOMINAL HYSTERECTOMY  09-05-2013   w/ BILATERAL SALPINGOOPHORECTOMY AND PELVIC LYMPHADECTOMY    There were no vitals filed for this visit.   Subjective Assessment - 03/25/21 1453     Subjective Pt reports she noticed she could not feel her Rt leg stretching or tiring on the bike, so she is using her LLE as a guide so that she doesn't overdo it.    Diagnostic tests Impression:  1. The  electrophysiologic findings are most suggestive of a L3-4 radiculopathy affecting bilateral lower extremities, worse on the right.  2. There is no evidence of a sensorimotor polyneuropathy affecting the lower extremities.    Patient Stated Goals "what I would love to do is be able to bend down and use my legs to get up"; has used cane 5 years    Currently in Pain? No/denies    Pain Score 0-No pain            Pt seen for aquatic therapy today.  Treatment took place in water 3.25-4 ft in depth at the [employer] pool. Temp of water was 91.  Pt entered/exited the pool via stairs with supervision with step to pattern and bilat UE on rail.  Treatment:   Holding onto aqua jogger barbell:   Forward gait focused on less abdct of RLE and more heel strike with Rt. Split squat with high knee follow through (with ankle wt on RLE)  Front flutter kick (attempts) holding yellow kickboard.   Seated on bench:  RLE, LAQ with blue wt 2.5# x 15.  Long sitting on bench, dropping RLE off of bench then hip flexing to bring back up onto bench x 15.   Holding onto side of pool:   Rt  hamstring curls with wt at ankle x 10 reps.  Hip abdct x 10 - RLE with ankle wt..  Bilat high kneeling, to single leg (out to side), then attempting to load the RLE (as if climbing out of pool) with hands on deck. X 5 reps R/L x 2 sets.    Without support :  Forward step up with RLE onto step in water x 10 reps, lateral step up x 10.  (Water level at lower sternum when standing beside step)  Pt requires buoyancy for support and to offload joints with strengthening exercises. Viscosity of the water is needed for resistance of strengthening; water current perturbations provides challenge to standing balance unsupported, requiring increased core activation.        PT Long Term Goals - 03/09/21 1126       PT LONG TERM GOAL #1   Title The patient will be indep with progression of  HEP for LE strengthening, balance  and moiblity.    Time 6    Period Weeks    Status Revised    Target Date 04/20/21      PT LONG TERM GOAL #2   Title The patient will improve R LE strength to 4/5 for R hip flexion    Time 6    Period Weeks    Status Revised    Target Date 04/20/21      PT LONG TERM GOAL #3   Title The patient will demonstrate ability to squat to the floor and return to standing without pressing through furniture.    Baseline has to keep legs locked and walk up her legs    Time 6    Period Weeks    Status Revised    Target Date 04/20/21      PT LONG TERM GOAL #4   Title The patient will improve Berg balance score from 36/56 to > or equal to 42/56 to demo improving steady state balance.    Time 6    Period Weeks    Status New    Target Date 04/20/21      PT LONG TERM GOAL #5   Title The patient will increase gait speed from 2.12 ft/sec to > or equal to 2.62 ft/sec to demo transition to "full community ambulator" classification of gait.    Time 6    Period Weeks    Status New    Target Date 04/20/21                   Plan - 03/25/21 1526     Clinical Impression Statement Pt was able to sense fatigue in Rt hip flexor after reps of weighted hip flexion.  Therapy to continue focusing on quad and hip strengthening to improve functional strength for daily activities.  Progressing towards LTGs.    Rehab Potential Good    PT Frequency 2x / week    PT Duration 6 weeks    PT Treatment/Interventions Aquatic Therapy;ADLs/Self Care Home Management;Patient/family education;Manual techniques;Functional mobility training;Therapeutic activities;Therapeutic exercise;Balance training;Neuromuscular re-education;Gait training;Stair training;DME Instruction;Orthotic Fit/Training;Taping    PT Next Visit Plan aquatic activities, LE strengthening, core stability, balance, gait training *on land: focus on hip flexor strength, standing in stride rocking to engage ankles and standing weight shifting    PT  Home Exercise Plan [email or web address]    Consulted and Agree with Plan of Care Patient             Patient will benefit from skilled therapeutic intervention in order  to improve the following deficits and impairments:  Impaired sensation, Decreased strength, Postural dysfunction, Decreased activity tolerance, Abnormal gait, Impaired tone  Visit Diagnosis: Muscle weakness (generalized)  Other abnormalities of gait and mobility  Unsteadiness on feet     Problem List Patient Active Problem List   Diagnosis Date Noted   Stress and adjustment reaction 01/13/2021   Inattention 10/28/2019   Hypothyroid 11/13/2016   Neuropathy involving both lower extremities 11/13/2016   Cystitis, radiation 05/22/2014   Cervical cancer ([place]) 09/01/2013   Adenocarcinoma of cervix, stage 1 ([place]) 08/26/2013   Asthma, mild intermittent 07/07/2009   INSOMNIA 05/28/2008   GENERALIZED ANXIETY DISORDER 05/07/2008   HYPERTENSION, BENIGN 05/31/2007   Kerin Perna, PTA 03/25/21 3:28 PM   [place], [place], [address] Phone: [phone]   Fax:  [phone]  Name: EUTHA CUDE MRN: [id number] Date of Birth: Oct 08, 1965

## 2021-03-30 ENCOUNTER — Ambulatory Visit (INDEPENDENT_AMBULATORY_CARE_PROVIDER_SITE_OTHER): Payer: 59 | Admitting: Rehabilitative and Restorative Service Providers"

## 2021-03-30 ENCOUNTER — Other Ambulatory Visit: Payer: Self-pay

## 2021-03-30 DIAGNOSIS — M21371 Foot drop, right foot: Secondary | ICD-10-CM

## 2021-03-30 DIAGNOSIS — R29818 Other symptoms and signs involving the nervous system: Secondary | ICD-10-CM

## 2021-03-30 DIAGNOSIS — M6281 Muscle weakness (generalized): Secondary | ICD-10-CM

## 2021-03-30 DIAGNOSIS — R2681 Unsteadiness on feet: Secondary | ICD-10-CM

## 2021-03-30 DIAGNOSIS — R2689 Other abnormalities of gait and mobility: Secondary | ICD-10-CM | POA: Diagnosis not present

## 2021-03-30 NOTE — Therapy (Signed)
[place], [place], [address] Phone: [phone]   Fax:  [phone]  Physical Therapy Treatment  Patient Details  Name: EDAN SERRATORE MRN: [id number] Date of Birth: 1966/05/02 Referring Provider (PT): Beatrice Lecher, MD   Encounter Date: 03/30/2021   PT End of Session - 03/30/21 0806     Visit Number 16    Number of Visits 24    Date for PT Re-Evaluation 04/20/21    Authorization Type cigna    PT Start Time 0801    PT Stop Time 0842    PT Time Calculation (min) 41 min    Activity Tolerance Patient tolerated treatment well    Behavior During Therapy [facility] for tasks assessed/performed             Past Medical History:  Diagnosis Date   Dysuria    Erythematous bladder mucosa    Heart murmur    asymptomatic per pt --  no echo   Hematuria    History of cervical cancer    12/ 2014  Stage IB2--  s/p  TAH w/ BSO and pelvic lymphadectomy/  and radiation therapy   History of radiation therapy 10/20/13-11/28/13   pelvis 50.4 gray   Hypertension    Lesion of bladder    Mild intermittent asthma    Neuromuscular disorder ([place])    Seasonal allergies    Thyroid disease     Past Surgical History:  Procedure Laterality Date   CYSTOSCOPY WITH BIOPSY N/A 04/29/2015   Procedure: CYSTOSCOPY WITH BIOPSY WITH FULGERATION;  Surgeon: [name], MD;  Location: [facility];  Service: Urology;  Laterality: N/A;  1 HR [phone] [id number]   FOOT SURGERY Left 2010   RADICAL ABDOMINAL HYSTERECTOMY  09-05-2013   w/ BILATERAL SALPINGOOPHORECTOMY AND PELVIC LYMPHADECTOMY    There were no vitals filed for this visit.   Subjective Assessment - 03/30/21 0805     Subjective The patient reports she will be out of town next week and has access to a pool.  She is using her bike at home and feels she is getting stronger.    Pertinent History cervical cancer with radiation, h/o  R ankle sprain, h/o R ankle fracture, thyroid  disease, fractured tibia L, L ankle fx 11 years ago    Diagnostic tests Impression:  1. The electrophysiologic findings are most suggestive of a L3-4 radiculopathy affecting bilateral lower extremities, worse on the right.  2. There is no evidence of a sensorimotor polyneuropathy affecting the lower extremities.    Patient Stated Goals "what I would love to do is be able to bend down and use my legs to get up"; has used cane 5 years    Currently in Pain? No/denies                [facility] PT Assessment - 03/30/21 4580       Assessment   Medical Diagnosis neuropathy involving both LEs    Referring Provider (PT) Beatrice Lecher, MD    Onset Date/Surgical Date 01/13/21    Hand Dominance Right      Strength   Right Hip Flexion 3/5    Left Hip Flexion 4/5                           [facility] Adult PT Treatment/Exercise - 03/30/21 9983       Ambulation/Gait   Ambulation/Gait Yes  Ambulation/Gait Assistance 6: Modified independent (Device/Increase time)    Assistive device Straight cane    Gait Comments patient reports she walks around the house without device, but uses furniture/walls for support      Neuro Re-ed    Neuro Re-ed Details  1/2 kneeling wiht physioball performing R And L UE reaching; wall bumps on solid surface and then compliant surfaces emphasizing hip strategy      Exercises   Exercises Knee/Hip;Lumbar      Lumbar Exercises: Aerobic   Nustep L5 x 4 minutes LEs only      Lumbar Exercises: Seated   Other Seated Lumbar Exercises seated marching iwth yoga block overhead in order to engage core      Lumbar Exercises: Quadruped   Plank quadriped walking out into high plank x 5 reps with 5 second holds    Other Quadruped Lumbar Exercises R hip flexion knee to R elbow x 8 reps , then L side    Other Quadruped Lumbar Exercises hip abduction with leg extended x 10 reps R and L      Knee/Hip Exercises:  Supine   Other Supine Knee/Hip Exercises hip flexion off mat with R LE attempting to lift leg on/off mat, then added 2 lbs with leg marching (R leg off mat)                         PT Long Term Goals - 03/30/21 0843       PT LONG TERM GOAL #1   Title The patient will be indep with progression of  HEP for LE strengthening, balance and moiblity.    Time 6    Period Weeks    Status On-going      PT LONG TERM GOAL #2   Title The patient will improve R LE strength to 4/5 for R hip flexion    Baseline 3/5 for R hip flexion    Time 6    Period Weeks    Status On-going      PT LONG TERM GOAL #3   Title The patient will demonstrate ability to squat to the floor and return to standing without pressing through furniture.    Baseline has to keep legs locked and walk up her legs    Time 6    Period Weeks    Status On-going      PT LONG TERM GOAL #4   Title The patient will improve Berg balance score from 36/56 to > or equal to 42/56 to demo improving steady state balance.    Time 6    Period Weeks    Status On-going      PT LONG TERM GOAL #5   Title The patient will increase gait speed from 2.12 ft/sec to > or equal to 2.62 ft/sec to demo transition to "full community ambulator" classification of gait.    Time 6    Period Weeks    Status On-going                   Plan - 03/30/21 1148     Clinical Impression Statement The patient is progressing with LE strength during functional tasks noting ability to recover from losses of balance.  PT progressing balance and LE strengthening to patient tolerance.    PT Treatment/Interventions Aquatic Therapy;ADLs/Self Care Home Management;Patient/family education;Manual techniques;Functional mobility training;Therapeutic activities;Therapeutic exercise;Balance training;Neuromuscular re-education;Gait training;Stair training;DME Instruction;Orthotic Fit/Training;Taping    PT Next Visit Plan aquatic activities,  LE  strengthening, core stability, balance, gait training *on land: focus on hip flexor strength, standing in stride rocking to engage ankles and standing weight shifting    PT Home Exercise Plan [id number]    Consulted and Agree with Plan of Care Patient             Patient will benefit from skilled therapeutic intervention in order to improve the following deficits and impairments:     Visit Diagnosis: Muscle weakness (generalized)  Other abnormalities of gait and mobility  Unsteadiness on feet  Other symptoms and signs involving the nervous system  Foot drop, right     Problem List Patient Active Problem List   Diagnosis Date Noted   Stress and adjustment reaction 01/13/2021   Inattention 10/28/2019   Hypothyroid 11/13/2016   Neuropathy involving both lower extremities 11/13/2016   Cystitis, radiation 05/22/2014   Cervical cancer ([place]) 09/01/2013   Adenocarcinoma of cervix, 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

## 2021-04-13 ENCOUNTER — Ambulatory Visit (INDEPENDENT_AMBULATORY_CARE_PROVIDER_SITE_OTHER): Payer: 59 | Admitting: Rehabilitative and Restorative Service Providers"

## 2021-04-13 ENCOUNTER — Encounter: Payer: Self-pay | Admitting: Rehabilitative and Restorative Service Providers"

## 2021-04-13 ENCOUNTER — Other Ambulatory Visit: Payer: Self-pay

## 2021-04-13 DIAGNOSIS — R2689 Other abnormalities of gait and mobility: Secondary | ICD-10-CM

## 2021-04-13 DIAGNOSIS — R29818 Other symptoms and signs involving the nervous system: Secondary | ICD-10-CM

## 2021-04-13 DIAGNOSIS — R2681 Unsteadiness on feet: Secondary | ICD-10-CM

## 2021-04-13 DIAGNOSIS — M6281 Muscle weakness (generalized): Secondary | ICD-10-CM | POA: Diagnosis not present

## 2021-04-13 DIAGNOSIS — M21371 Foot drop, right foot: Secondary | ICD-10-CM

## 2021-04-13 NOTE — Therapy (Signed)
[place], [place], [address] Phone: [phone]   Fax:  [phone]  Physical Therapy Treatment  Patient Details  Name: Anna Ramsey MRN: [id number] Date of Birth: 1966-05-12 Referring Provider (PT): Beatrice Lecher, MD   Encounter Date: 04/13/2021   PT End of Session - 04/13/21 0912     Visit Number 17    Number of Visits 24    Date for PT Re-Evaluation 04/20/21    Authorization Type cigna    PT Start Time 0807    PT Stop Time 0846    PT Time Calculation (min) 39 min    Activity Tolerance Patient tolerated treatment well    Behavior During Therapy [facility] for tasks assessed/performed             Past Medical History:  Diagnosis Date   Dysuria    Erythematous bladder mucosa    Heart murmur    asymptomatic per pt --  no echo   Hematuria    History of cervical cancer    12/ 2014  Stage IB2--  s/p  TAH w/ BSO and pelvic lymphadectomy/  and radiation therapy   History of radiation therapy 10/20/13-11/28/13   pelvis 50.4 gray   Hypertension    Lesion of bladder    Mild intermittent asthma    Neuromuscular disorder ([place])    Seasonal allergies    Thyroid disease     Past Surgical History:  Procedure Laterality Date   CYSTOSCOPY WITH BIOPSY N/A 04/29/2015   Procedure: CYSTOSCOPY WITH BIOPSY WITH FULGERATION;  Surgeon: [name], MD;  Location[facility];  Service: Urology;  Laterality: N/A;  1 HR [phone] [id number]   FOOT SURGERY Left 2010   RADICAL ABDOMINAL HYSTERECTOMY  09-05-2013   w/ BILATERAL SALPINGOOPHORECTOMY AND PELVIC LYMPHADECTOMY    There were no vitals filed for this visit.   Subjective Assessment - 04/13/21 0809     Subjective The patient was on vacation last week.  She got her AFO and didn't bring it today b/c she woke up late.  She is wearing the brace a couple of hours each day to improve tolerance.  She had one fall while  on vacation due to an unusual step in the rental house that was uneven.  She is not falling regularly and feels like she can now catch herself with losses of balance.    Pertinent History cervical cancer with radiation, h/o R ankle sprain, h/o R ankle fracture, thyroid  disease, fractured tibia L, L ankle fx 11 years ago    Patient Stated Goals "what I would love to do is be able to bend down and use my legs to get up"; has used cane 5 years                [facility] PT Assessment - 04/13/21 0830       Assessment   Medical Diagnosis neuropathy involving both LEs    Referring Provider (PT) Beatrice Lecher, MD    Onset Date/Surgical Date 01/13/21      Strength   Right Hip Flexion 3/5      Berg Balance Test   Sit to Stand Able to stand without using hands and stabilize independently    Standing Unsupported Able to stand safely 2 minutes    Sitting with Back Unsupported but Feet Supported on Floor or Stool Able to sit safely and securely 2 minutes    Stand to Sit Sits safely  with minimal use of hands    Transfers Able to transfer safely, minor use of hands    Standing Unsupported with Eyes Closed Able to stand 10 seconds safely    Standing Unsupported with Feet Together Able to place feet together independently and stand 1 minute safely    From Standing, Reach Forward with Outstretched Arm Can reach forward >12 cm safely (5")    From Standing Position, Pick up Object from [place] to pick up shoe safely and easily    From Standing Position, Turn to Look Behind Over each Shoulder Looks behind from both sides and weight shifts well    Turn 360 Degrees Able to turn 360 degrees safely in 4 seconds or less    Standing Unsupported, Alternately Place Feet on Step/Stool Able to complete >2 steps/needs minimal assist    Standing Unsupported, One Foot in Front Able to plae foot ahead of the other independently and hold 30 seconds    Standing on One Leg Tries to lift leg/unable to hold 3 seconds  but remains standing independently    Total Score 48    Berg comment: improved from 36/56 up to 48/56                           [facility] Adult PT Treatment/Exercise - 04/13/21 0830       Ambulation/Gait   Ambulation/Gait Yes    Ambulation/Gait Assistance 6: Modified independent (Device/Increase time)    Assistive device Straight cane;None    Gait Comments patient walks short distances in clinic without use of cane; currently she does not use a device for indoor surfaces      Self-Care   Self-Care Other Self-Care Comments    Other Self-Care Comments  discussed home exercises and patient goals for therapy      Exercises   Exercises Knee/Hip;Lumbar    Other Exercises  patient demonstrates standing LE extension, abduction and posterior/lateral kicks-- she uses these exercises at home when standing near support surface (fence, counter)      Knee/Hip Exercises: Seated   Marching Strengthening;Right;10 reps    Marching Limitations added theraband for resisted marching in seated position      Knee/Hip Exercises: Supine   Other Supine Knee/Hip Exercises hip flexion off mat with R LE attempting to lift leg on/off mat-- patient able to lift without use of R UE                         PT Long Term Goals - 04/13/21 0840       PT LONG TERM GOAL #1   Title The patient will be indep with progression of  HEP for LE strengthening, balance and moiblity.    Time 6    Period Weeks    Status On-going      PT LONG TERM GOAL #2   Title The patient will improve R LE strength to 4/5 for R hip flexion    Baseline 3/5 for R hip flexion    Time 6    Period Weeks    Status Not Met      PT LONG TERM GOAL #3   Title The patient will demonstrate ability to squat to the floor and return to standing without pressing through furniture.    Baseline The patient can bend knees to squat and return to standing.    Time 6    Period Weeks  Status Achieved      PT LONG TERM  GOAL #4   Title The patient will improve Berg balance score from 36/56 to > or equal to 42/56 to demo improving steady state balance.    Baseline improved 36/56 to 48/56    Time 6    Period Weeks    Status Achieved      PT LONG TERM GOAL #5   Title The patient will increase gait speed from 2.12 ft/sec to > or equal to 2.62 ft/sec to demo transition to "full community ambulator" classification of gait.    Baseline 2.58 ft/sec on 04/13/21 without AFO    Time 6    Period Weeks    Status Partially Met                   Plan - 04/13/21 0913     Clinical Impression Statement The patient met 2 LTGs and partially met 1 LTG.  PT to continue to work towards HEP, aquatic exercises and will assess AFO with gait.  Anticipate d/c next week with current HEP.    PT Treatment/Interventions Aquatic Therapy;ADLs/Self Care Home Management;Patient/family education;Manual techniques;Functional mobility training;Therapeutic activities;Therapeutic exercise;Balance training;Neuromuscular re-education;Gait training;Stair training;DME Instruction;Orthotic Fit/Training;Taping    PT Next Visit Plan aquatic activities, LE strengthening, core stability, balance, gait training *on land: focus on hip flexor strength, standing in stride rocking to engage ankles and standing weight shifting    PT Home Exercise Plan [id number]    Consulted and Agree with Plan of Care Patient             Patient will benefit from skilled therapeutic intervention in order to improve the following deficits and impairments:     Visit Diagnosis: Muscle weakness (generalized)  Other abnormalities of gait and mobility  Unsteadiness on feet  Other symptoms and signs involving the nervous system  Foot drop, right     Problem List Patient Active Problem List   Diagnosis Date Noted   Stress and adjustment reaction 01/13/2021   Inattention 10/28/2019   Hypothyroid 11/13/2016   Neuropathy involving both lower extremities  11/13/2016   Cystitis, radiation 05/22/2014   Cervical cancer ([place]) 09/01/2013   Adenocarcinoma of cervix, stage 1 ([place]) 08/26/2013   Asthma, mild intermittent 07/07/2009   INSOMNIA 05/28/2008   GENERALIZED ANXIETY DISORDER 05/07/2008   HYPERTENSION, BENIGN 05/31/2007    Alyissa Whidbee, PT 04/13/2021, 9:23 AM  [facility] [place], [place], [address] Phone: [phone]   Fax:  [phone]  Name: Anna Ramsey MRN: [id number] Date of Birth: July 29, 1966

## 2021-04-15 ENCOUNTER — Telehealth: Payer: Self-pay | Admitting: Physical Therapy

## 2021-04-15 ENCOUNTER — Ambulatory Visit: Payer: 59 | Admitting: Physical Therapy

## 2021-04-15 NOTE — Telephone Encounter (Signed)
Patient did not show for aquatic therapy appt today.  Called and left HIPAA compliant voice message for her, regarding missed appt and requesting she call the office to confirm her next land PT appt.  [phone].    Kerin Perna, PTA 04/15/21 12:45 PM

## 2021-04-20 ENCOUNTER — Other Ambulatory Visit: Payer: Self-pay

## 2021-04-20 ENCOUNTER — Ambulatory Visit (INDEPENDENT_AMBULATORY_CARE_PROVIDER_SITE_OTHER): Payer: 59 | Admitting: Rehabilitative and Restorative Service Providers"

## 2021-04-20 DIAGNOSIS — R29818 Other symptoms and signs involving the nervous system: Secondary | ICD-10-CM | POA: Diagnosis not present

## 2021-04-20 DIAGNOSIS — R2681 Unsteadiness on feet: Secondary | ICD-10-CM | POA: Diagnosis not present

## 2021-04-20 DIAGNOSIS — M21371 Foot drop, right foot: Secondary | ICD-10-CM

## 2021-04-20 DIAGNOSIS — M6281 Muscle weakness (generalized): Secondary | ICD-10-CM

## 2021-04-20 DIAGNOSIS — R2689 Other abnormalities of gait and mobility: Secondary | ICD-10-CM | POA: Diagnosis not present

## 2021-04-20 NOTE — Therapy (Signed)
[place], [place], [address] Phone: [phone]   Fax:  [phone]  Physical Therapy Treatment and Discharge Summary  Patient Details  Name: Anna Ramsey MRN: [id number] Date of Birth: [birth year] Referring Provider (PT): Beatrice Lecher, MD   Encounter Date: 04/20/2021   PHYSICAL THERAPY DISCHARGE SUMMARY  Visits from Start of Care: 18  Current functional level related to goals / functional outcomes: See goals below.   Remaining deficits: R hip flexor weakness LE sensory impairment Gait abnormalities   Education / Equipment: HEP, pool/aquatic exercise, fall prevention, use of AFO   Patient agrees to discharge. Patient goals were partially met. Patient is being discharged due to meeting the stated rehab goals.   PT End of Session - 04/20/21 0807     Visit Number 18    Number of Visits 24    Date for PT Re-Evaluation 04/20/21    Authorization Type cigna    PT Start Time 0801    PT Stop Time 0830    PT Time Calculation (min) 29 min    Activity Tolerance Patient tolerated treatment well    Behavior During Therapy [facility] for tasks assessed/performed             Past Medical History:  Diagnosis Date   Dysuria    Erythematous bladder mucosa    Heart murmur    asymptomatic per pt --  no echo   Hematuria    History of cervical cancer    12/ 2014  Stage IB2--  s/p  TAH w/ BSO and pelvic lymphadectomy/  and radiation therapy   History of radiation therapy 10/20/13-11/28/13   pelvis 50.4 gray   Hypertension    Lesion of bladder    Mild intermittent asthma    Neuromuscular disorder ([facility])    Seasonal allergies    Thyroid disease     Past Surgical History:  Procedure Laterality Date   CYSTOSCOPY WITH BIOPSY N/A 04/29/2015   Procedure: CYSTOSCOPY WITH BIOPSY WITH FULGERATION;  Surgeon: [name], MD;  Location: [facility];  Service: Urology;   Laterality: N/A;  1 HR [id number]   FOOT SURGERY Left 2010   RADICAL ABDOMINAL HYSTERECTOMY  09-05-2013   w/ BILATERAL SALPINGOOPHORECTOMY AND PELVIC LYMPHADECTOMY    There were no vitals filed for this visit.   Subjective Assessment - 04/20/21 0803     Subjective The patient reports she forgot her AFO today b/c she hasn't worn it this week-- has not left her house due to work and caring for grandchildren over the weekend.  She did 20 minutes on her exercise bike last night.    Pertinent History cervical cancer with radiation, h/o R ankle sprain, h/o R ankle fracture, thyroid  disease, fractured tibia L, L ankle fx 11 years ago    Patient Stated Goals "what I would love to do is be able to bend down and use my legs to get up"; has used cane 5 years    Currently in Pain? No/denies                [facility] PT Assessment - 04/20/21 0806       Assessment   Medical Diagnosis neuropathy involving both LEs    Referring Provider (PT) Beatrice Lecher, MD    Onset Date/Surgical Date 01/13/21    Hand Dominance Right  [place] Adult PT Treatment/Exercise - 04/20/21 0806       Ambulation/Gait   Ambulation/Gait Yes    Ambulation/Gait Assistance 6: Modified independent (Device/Increase time)    Assistive device Straight cane;None      Self-Care   Self-Care Other Self-Care Comments    Other Self-Care Comments  discussed brace use for longer distances and for unlevel surface negotiation, discussed fall prevention with patient noting she relies on well lit environments (discussed visual reliance due to dec'd sensation in feet), also discussed continued progression of ther ex post d/c      Lumbar Exercises: Standing   Other Standing Lumbar Exercises sit to stand working on slow lowering and dec'd UE support      Lumbar Exercises: Quadruped   Opposite Arm/Leg Raise 5 reps;Right arm/Left leg;Left arm/Right leg      Knee/Hip  Exercises: Prone   Hamstring Curl 10 reps                         PT Long Term Goals - 04/20/21 0836       PT LONG TERM GOAL #1   Title The patient will be indep with progression of  HEP for LE strengthening, balance and moiblity.    Time 6    Period Weeks    Status Achieved      PT LONG TERM GOAL #2   Title The patient will improve R LE strength to 4/5 for R hip flexion    Baseline 3/5 for R hip flexion    Time 6    Period Weeks    Status Not Met      PT LONG TERM GOAL #3   Title The patient will demonstrate ability to squat to the floor and return to standing without pressing through furniture.    Baseline The patient can bend knees to squat and return to standing.    Time 6    Period Weeks    Status Achieved      PT LONG TERM GOAL #4   Title The patient will improve Berg balance score from 36/56 to > or equal to 42/56 to demo improving steady state balance.    Baseline improved 36/56 to 48/56    Time 6    Period Weeks    Status Achieved      PT LONG TERM GOAL #5   Title The patient will increase gait speed from 2.12 ft/sec to > or equal to 2.62 ft/sec to demo transition to "full community ambulator" classification of gait.    Baseline 2.58 ft/sec on 04/13/21 without AFO    Time 6    Period Weeks    Status Partially Met                   Plan - 04/20/21 0837     Clinical Impression Statement The patient has partially met LTGs.  She continues with R hip flexor weakness which correlates to diagnostic testing of L3-L4 radiculopathy.  She also has sensory impairment in LEs.  The patient has progressed with strengthening activities and has improved gait noting less falls and improved ability to recover from losses of balance.  Patient to continue HEP post d/c emphasizing continued LE strengthening.    PT Treatment/Interventions Aquatic Therapy;ADLs/Self Care Home Management;Patient/family education;Manual techniques;Functional mobility  training;Therapeutic activities;Therapeutic exercise;Balance training;Neuromuscular re-education;Gait training;Stair training;DME Instruction;Orthotic Fit/Training;Taping    PT Next Visit Plan discharge today    PT Home Exercise Plan [id number]  Consulted and Agree with Plan of Care Patient             Patient will benefit from skilled therapeutic intervention in order to improve the following deficits and impairments:     Visit Diagnosis: Muscle weakness (generalized)  Other abnormalities of gait and mobility  Unsteadiness on feet  Other symptoms and signs involving the nervous system  Foot drop, right     Problem List Patient Active Problem List   Diagnosis Date Noted   Stress and adjustment reaction 01/13/2021   Inattention 10/28/2019   Hypothyroid 11/13/2016   Neuropathy involving both lower extremities 11/13/2016   Cystitis, radiation 05/22/2014   Cervical cancer ([place]) 09/01/2013   Adenocarcinoma of cervix, stage 1 ([place]) 08/26/2013   Asthma, mild intermittent 07/07/2009   INSOMNIA 05/28/2008   GENERALIZED ANXIETY DISORDER 05/07/2008   HYPERTENSION, BENIGN 05/31/2007   Thank you for the referral of this patient. Rudell Cobb, MPT  [place] 04/20/2021, 8:39 AM  [facility] [place], [place], [address] Phone: [phone]   Fax:  [phone]  Name: SKYLYNN BURKLEY MRN: [id number] Date of Birth: 1966-06-09

## 2021-04-28 ENCOUNTER — Encounter: Payer: Self-pay | Admitting: Family Medicine

## 2021-04-28 DIAGNOSIS — E038 Other specified hypothyroidism: Secondary | ICD-10-CM

## 2021-04-29 NOTE — Telephone Encounter (Signed)
Lab ordered.

## 2021-05-16 ENCOUNTER — Other Ambulatory Visit: Payer: Self-pay | Admitting: Family Medicine

## 2021-05-20 ENCOUNTER — Ambulatory Visit: Payer: 59 | Admitting: Neurology

## 2021-05-27 ENCOUNTER — Other Ambulatory Visit: Payer: Self-pay | Admitting: Urology

## 2021-06-04 LAB — TSH+FREE T4: TSH W/REFLEX TO FT4: 1.12 mIU/L

## 2021-06-06 NOTE — Progress Notes (Signed)
Your lab work is within acceptable range and there are no concerning findings.   ?

## 2021-06-17 ENCOUNTER — Other Ambulatory Visit: Payer: Self-pay | Admitting: *Deleted

## 2021-06-17 MED ORDER — LEVOTHYROXINE SODIUM 88 MCG PO TABS: 88.0000 ug | ORAL_TABLET | Freq: Every day | ORAL | 1 refills | Status: DC

## 2021-06-27 ENCOUNTER — Other Ambulatory Visit: Payer: Self-pay | Admitting: Family Medicine

## 2021-06-27 DIAGNOSIS — I10 Essential (primary) hypertension: Secondary | ICD-10-CM

## 2021-06-29 ENCOUNTER — Encounter: Payer: Self-pay | Admitting: Family Medicine

## 2021-06-30 ENCOUNTER — Encounter: Payer: Self-pay | Admitting: Family Medicine

## 2021-06-30 ENCOUNTER — Ambulatory Visit (INDEPENDENT_AMBULATORY_CARE_PROVIDER_SITE_OTHER): Payer: 59 | Admitting: Family Medicine

## 2021-06-30 ENCOUNTER — Other Ambulatory Visit: Payer: Self-pay

## 2021-06-30 VITALS — BP 137/72 | HR 72 | Ht 62.0 in | Wt 131.0 lb

## 2021-06-30 DIAGNOSIS — I10 Essential (primary) hypertension: Secondary | ICD-10-CM

## 2021-06-30 DIAGNOSIS — E038 Other specified hypothyroidism: Secondary | ICD-10-CM

## 2021-06-30 DIAGNOSIS — G5793 Unspecified mononeuropathy of bilateral lower limbs: Secondary | ICD-10-CM

## 2021-06-30 DIAGNOSIS — Z23 Encounter for immunization: Secondary | ICD-10-CM | POA: Diagnosis not present

## 2021-06-30 DIAGNOSIS — J4520 Mild intermittent asthma, uncomplicated: Secondary | ICD-10-CM | POA: Diagnosis not present

## 2021-06-30 DIAGNOSIS — Z566 Other physical and mental strain related to work: Secondary | ICD-10-CM

## 2021-06-30 MED ORDER — PREGABALIN 100 MG PO CAPS: 100.0000 mg | ORAL_CAPSULE | Freq: Two times a day (BID) | ORAL | 3 refills | Status: DC

## 2021-06-30 NOTE — Assessment & Plan Note (Signed)
Well controlled. Continue current regimen. Follow up in  6 mo  

## 2021-06-30 NOTE — Assessment & Plan Note (Signed)
Will take over her Lyrica. New rx sent to pharmacy. She doesn't use it every day.

## 2021-06-30 NOTE — Progress Notes (Signed)
Established Patient Office Visit  Subjective:  Patient ID: Anna Ramsey, female    DOB: 18-Feb-1966  Age: 55 y.o. MRN: [id number]  CC:  Chief Complaint  Patient presents with   Hypertension     HPI Anna Ramsey presents for   F/U Asthma -   Hypertension- Pt denies chest pain, SOB, dizziness, or heart palpitations.  Taking meds as directed w/o problems.  Denies medication side effects.    She also wanted to know if I would take over her Lyrica since she is no longer seeing neurology.  She has been really stressed at work. She feels very stressed.  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  Past Medical History:  Diagnosis Date   Dysuria    Erythematous bladder mucosa    Heart murmur    asymptomatic per pt --  no echo   Hematuria    History of cervical cancer    12/ 2014  Stage IB2--  s/p  TAH w/ BSO and pelvic lymphadectomy/  and radiation therapy   History of radiation therapy 10/20/13-11/28/13   pelvis 50.4 gray   Hypertension    Lesion of bladder    Mild intermittent asthma    Neuromuscular disorder (HCC)    Seasonal allergies    Thyroid disease     Past Surgical History:  Procedure Laterality Date   CYSTOSCOPY WITH BIOPSY N/A 04/29/2015   Procedure: CYSTOSCOPY WITH BIOPSY WITH FULGERATION;  Surgeon: [name], MD;  Location: [facility];  Service: Urology;  Laterality: N/A;  1 HR [phone] [id number]   FOOT SURGERY Left 2010   RADICAL ABDOMINAL HYSTERECTOMY  09-05-2013   w/ BILATERAL SALPINGOOPHORECTOMY AND PELVIC LYMPHADECTOMY    Family History  Problem Relation Age of Onset   Heart attack Father 33   Diabetes Father    Hypertension Maternal Grandmother    Stroke Maternal Grandmother    Cancer Mother        breast/thyroid     Social History   Socioeconomic History   Marital status: Married    Spouse name: Not on file   Number of children: 1   Years of education: Not on file   Highest education level: Not on file  Occupational History    Employer: [employer]  Tobacco Use   Smoking status: Former    Packs/day: 0.50    Years: 25.00    Pack years: 12.50    Types: E-cigarettes, Cigarettes    Quit date: 09/25/2012    Years since quitting: 8.7   Smokeless tobacco: Never   Tobacco comments:    has been using e-cig for past 2 yrs  Substance and Sexual Activity   Alcohol use: Yes    Alcohol/week: 1.0 standard drink    Types: 1 Standard drinks or equivalent per week    Comment: occasional   Drug use: No   Sexual activity: Not on file  Other Topics Concern   Not on file  Social History Narrative   Right handed   Social Determinants of Health   Financial Resource Strain: Not on file  Food Insecurity: Not on file  Transportation Needs: Not on file  Physical Activity: Not on file  Stress: Not on file  Social Connections: Not on  file  Intimate Partner Violence: Not on file    Outpatient Medications Prior to Visit  Medication Sig Dispense Refill   albuterol (VENTOLIN HFA) 108 (90 Base) MCG/ACT inhaler Inhale 1-2 puffs into the lungs every 6 (six) hours as needed for wheezing or shortness of breath. 36 g 2   AMBULATORY NON FORMULARY MEDICATION Medication Name: R custom ankle foot orthoses. DX: O70.96,G83.662 [place] fax: [phone] 1 each 0   budesonide-formoterol (SYMBICORT) 80-4.5 MCG/ACT inhaler Inhale 2 puffs into the lungs 2 (two) times daily. (Patient taking differently: Inhale 1 puff into the lungs daily.) 1 Inhaler 5   buPROPion (WELLBUTRIN XL) 150 MG 24 hr tablet TAKE 1 TABLET EVERY MORNING 90 tablet 3   cetirizine (ZYRTEC) 10 MG tablet Take 10 mg by mouth at bedtime.     fluticasone (FLONASE) 50 MCG/ACT nasal spray Place 2 sprays into both nostrils daily as needed.      levothyroxine  (SYNTHROID) 88 MCG tablet Take 1 tablet (88 mcg total) by mouth daily. 90 tablet 1   metoprolol tartrate (LOPRESSOR) 100 MG tablet TAKE 1 TABLET TWICE A DAY 180 tablet 3   PARoxetine (PAXIL) 30 MG tablet TAKE 1 TABLET DAILY 90 tablet 3   tolterodine (DETROL LA) 4 MG 24 hr capsule TAKE 1 CAPSULE DAILY 90 capsule 3   traZODone (DESYREL) 50 MG tablet TAKE 1 TABLET AT BEDTIME 90 tablet 3   pregabalin (LYRICA) 100 MG capsule Take 1 capsule (100 mg total) by mouth 3 (three) times daily. 45 capsule 0   No facility-administered medications prior to visit.    Allergies  Allergen Reactions   Sulfa Antibiotics Other (See Comments)    Numbness in lower extremities from knees down   Cefdinir    Penicillins Other (See Comments)    Unknown childhood reaction    ROS Review of Systems    Objective:    Physical Exam Constitutional:      Appearance: Normal appearance. She is well-developed.  HENT:     Head: Normocephalic and atraumatic.  Cardiovascular:     Rate and Rhythm: Normal rate and regular rhythm.     Heart sounds: Normal heart sounds.  Pulmonary:     Effort: Pulmonary effort is normal.     Breath sounds: Normal breath sounds.  Skin:    General: Skin is warm and dry.  Neurological:     Mental Status: She is alert and oriented to person, place, and time.  Psychiatric:        Behavior: Behavior normal.   BP 137/72   Pulse 72   Ht 5\' 2"  (1.575 m)   Wt 131 lb (59.4 kg)   LMP 08/19/2013   SpO2 100%   BMI 23.96 kg/m  Wt Readings from Last 3 Encounters:  06/30/21 131 lb (59.4 kg)  01/13/21 128 lb (58.1 kg)  01/10/21 121 lb (54.9 kg)     Health Maintenance Due  Topic Date Due   Hepatitis C Screening  Never done   COVID-19 Vaccine (2 - Janssen risk series) 01/31/2020    There are no preventive care reminders to display for this patient.  Lab Results  Component Value Date   TSH 3.57 01/13/2021   Lab Results  Component Value Date   WBC 5.3 01/13/2021   HGB 14.9  01/13/2021   HCT 43.8 01/13/2021   MCV 96.9 01/13/2021   PLT 284 01/13/2021   Lab Results  Component Value Date   NA 139 01/13/2021   K 4.3 01/13/2021  CO2 27 01/13/2021   GLUCOSE 81 01/13/2021   BUN 12 01/13/2021   CREATININE 0.63 01/13/2021   BILITOT 0.4 01/13/2021   ALKPHOS 106 05/11/2016   AST 23 01/13/2021   ALT 24 01/13/2021   PROT 6.6 01/13/2021   ALBUMIN 4.2 01/03/2021   CALCIUM 9.3 01/13/2021   Lab Results  Component Value Date   CHOL 216 (H) 01/13/2021   Lab Results  Component Value Date   HDL 57 01/13/2021   Lab Results  Component Value Date   LDLCALC 142 (H) 01/13/2021   Lab Results  Component Value Date   TRIG 80 01/13/2021   Lab Results  Component Value Date   CHOLHDL 3.8 01/13/2021   Lab Results  Component Value Date   HGBA1C 5.4 06/28/2017      Assessment & Plan:   Problem List Items Addressed This Visit       Cardiovascular and Mediastinum   HYPERTENSION, BENIGN - Primary    Well controlled. Continue current regimen. Follow up in  6 mo         Respiratory   Asthma, mild intermittent    Well controlled, intermittant.          Endocrine   Hypothyroid    Well controlled.  Continue current regimen.  Lab Results  Component Value Date   TSH 3.57 01/13/2021           Nervous and Auditory   Neuropathy involving both lower extremities    Will take over her Lyrica. New rx sent to pharmacy. She doesn't use it every day.        Relevant Medications   pregabalin (LYRICA) 100 MG capsule   Other Visit Diagnoses     Need for immunization against influenza       Relevant Orders   Flu Vaccine QUAD 19mo+IM (Fluarix, Fluzone & Alfiuria Quad PF) (Completed)   Work stress          Work stress-discussed some strategies around really try to have several conversations with her boss about setting expectations and limits and really protecting some of her valuable time as well.  Offered to refer her for therapy/counseling and just  encouraged her to think about it.  In the meantime we will continue with Paxil.  Meds ordered this encounter  Medications   pregabalin (LYRICA) 100 MG capsule    Sig: Take 1 capsule (100 mg total) by mouth 2 (two) times daily.    Dispense:  180 capsule    Refill:  3     Follow-up: Return in about 6 months (around 12/29/2021) for Hypertension and Thyroid.    Beatrice Lecher, MD

## 2021-06-30 NOTE — Assessment & Plan Note (Signed)
Well controlled, intermittant.

## 2021-06-30 NOTE — Assessment & Plan Note (Signed)
Well controlled.  Continue current regimen.  Lab Results  Component Value Date   TSH 3.57 01/13/2021

## 2021-07-08 MED ORDER — ACYCLOVIR 400 MG PO TABS: 800.0000 mg | ORAL_TABLET | Freq: Every day | ORAL | 99 refills | Status: DC

## 2021-07-18 ENCOUNTER — Other Ambulatory Visit: Payer: Self-pay | Admitting: Family Medicine

## 2021-07-18 DIAGNOSIS — F5101 Primary insomnia: Secondary | ICD-10-CM

## 2021-10-26 ENCOUNTER — Encounter: Payer: Self-pay | Admitting: Family Medicine

## 2021-11-04 ENCOUNTER — Other Ambulatory Visit: Payer: Self-pay

## 2021-11-04 ENCOUNTER — Ambulatory Visit (INDEPENDENT_AMBULATORY_CARE_PROVIDER_SITE_OTHER): Payer: 59 | Admitting: Family Medicine

## 2021-11-04 ENCOUNTER — Encounter: Payer: Self-pay | Admitting: Family Medicine

## 2021-11-04 VITALS — BP 132/99 | HR 75 | Resp 18 | Ht 62.0 in | Wt 128.0 lb

## 2021-11-04 DIAGNOSIS — J22 Unspecified acute lower respiratory infection: Secondary | ICD-10-CM | POA: Diagnosis not present

## 2021-11-04 MED ORDER — PREDNISONE 20 MG PO TABS: 40.0000 mg | ORAL_TABLET | Freq: Every day | ORAL | 0 refills | Status: DC

## 2021-11-04 MED ORDER — AZITHROMYCIN 250 MG PO TABS: ORAL_TABLET | ORAL | 0 refills | Status: AC

## 2021-11-04 NOTE — Progress Notes (Signed)
Acute Office Visit  Subjective:    Patient ID: Anna Ramsey, female    DOB: [birth year], 56 y.o.   MRN: [id number]  No chief complaint on file.   HPI Patient is in today for upper respiratory illness that has been going on for about 2 weeks.  She is using her inhaler and Mucinex.  She is here crackling sounds in her chest when she exhales.  It is more of a dry cough now but with some occasional productive sputum.  She says she just cannot get it to stop.  She feels very exhausted.` Has some sinus pressure but has been using her Zyrtec and nasal spray.  Reports shortness of breath with activity.  No sore throat.  Past Medical History:  Diagnosis Date   Dysuria    Erythematous bladder mucosa    Heart murmur    asymptomatic per pt --  no echo   Hematuria    History of cervical cancer    12/ 2014  Stage IB2--  s/p  TAH w/ BSO and pelvic lymphadectomy/  and radiation therapy   History of radiation therapy 10/20/13-11/28/13   pelvis 50.4 gray   Hypertension    Lesion of bladder    Mild intermittent asthma    Neuromuscular disorder (HCC)    Seasonal allergies    Thyroid disease     Past Surgical History:  Procedure Laterality Date   CYSTOSCOPY WITH BIOPSY N/A 04/29/2015   Procedure: CYSTOSCOPY WITH BIOPSY WITH FULGERATION;  Surgeon: [name], MD;  Location: [facility];  Service: Urology;  Laterality: N/A;  1 HR [phone] [id number]   FOOT SURGERY Left 2010   RADICAL ABDOMINAL HYSTERECTOMY  09-05-2013   w/ BILATERAL SALPINGOOPHORECTOMY AND PELVIC LYMPHADECTOMY    Family History  Problem Relation Age of Onset   Heart attack Father 29   Diabetes Father    Hypertension Maternal Grandmother    Stroke Maternal Grandmother    Cancer Mother        breast/thyroid    Social History   Socioeconomic History   Marital status: Married    Spouse name: Not on file   Number of children: 1   Years of education: Not on file   Highest education  level: Not on file  Occupational History    Employer: [employer]  Tobacco Use   Smoking status: Former    Packs/day: 0.50    Years: 25.00    Pack years: 12.50    Types: E-cigarettes, Cigarettes    Quit date: 09/25/2012    Years since quitting: 9.1   Smokeless tobacco: Never   Tobacco comments:    has been using e-cig for past 2 yrs  Substance and Sexual Activity   Alcohol use: Yes    Alcohol/week: 1.0 standard drink    Types: 1 Standard drinks or equivalent per week    Comment: occasional   Drug use: No   Sexual activity: Not on file  Other Topics Concern   Not on file  Social History Narrative   Right handed   Social Determinants of Health   Financial Resource Strain: Not on file  Food Insecurity: Not on file  Transportation Needs: Not on file  Physical Activity: Not on file  Stress: Not on file  Social Connections: Not on file  Intimate Partner Violence: Not on file    Outpatient Medications Prior to Visit  Medication Sig Dispense Refill   albuterol (VENTOLIN HFA) 108 (90 Base) MCG/ACT inhaler Inhale 1-2  puffs into the lungs every 6 (six) hours as needed for wheezing or shortness of breath. 36 g 2   AMBULATORY NON FORMULARY MEDICATION Medication Name: R custom ankle foot orthoses. DX: X21.19,E17.408 [place] fax: [phone] 1 each 0   budesonide-formoterol (SYMBICORT) 80-4.5 MCG/ACT inhaler Inhale 2 puffs into the lungs 2 (two) times daily. (Patient taking differently: Inhale 1 puff into the lungs daily.) 1 Inhaler 5   buPROPion (WELLBUTRIN XL) 150 MG 24 hr tablet TAKE 1 TABLET EVERY MORNING 90 tablet 3   cetirizine (ZYRTEC) 10 MG tablet Take 10 mg by mouth at bedtime.     fluticasone (FLONASE) 50 MCG/ACT nasal spray Place 2 sprays into both nostrils daily as needed.      levothyroxine (SYNTHROID) 88 MCG tablet Take 1 tablet (88 mcg total) by mouth daily. 90 tablet 1   metoprolol tartrate (LOPRESSOR) 100 MG tablet TAKE 1 TABLET TWICE A DAY 180 tablet 3   PARoxetine (PAXIL)  30 MG tablet TAKE 1 TABLET DAILY 90 tablet 3   pregabalin (LYRICA) 100 MG capsule Take 1 capsule (100 mg total) by mouth 2 (two) times daily. 180 capsule 3   tolterodine (DETROL LA) 4 MG 24 hr capsule TAKE 1 CAPSULE DAILY 90 capsule 3   traZODone (DESYREL) 50 MG tablet TAKE 1 TABLET AT BEDTIME 90 tablet 3   No facility-administered medications prior to visit.    Allergies  Allergen Reactions   Sulfa Antibiotics Other (See Comments)    Numbness in lower extremities from knees down   Cefdinir    Penicillins Other (See Comments)    Unknown childhood reaction    Review of Systems     Objective:    Physical Exam Constitutional:      Appearance: She is well-developed.  HENT:     Head: Normocephalic and atraumatic.     Right Ear: External ear normal.     Left Ear: External ear normal.     Nose: Nose normal.  Eyes:     Conjunctiva/sclera: Conjunctivae normal.     Pupils: Pupils are equal, round, and reactive to light.  Neck:     Thyroid: No thyromegaly.  Cardiovascular:     Rate and Rhythm: Normal rate and regular rhythm.     Heart sounds: Normal heart sounds.  Pulmonary:     Effort: Pulmonary effort is normal.     Breath sounds: Normal breath sounds. No wheezing.  Musculoskeletal:     Cervical back: Neck supple.  Lymphadenopathy:     Cervical: No cervical adenopathy.  Skin:    General: Skin is warm and dry.  Neurological:     Mental Status: She is alert and oriented to person, place, and time.    BP (!) 132/99    Pulse 75    Resp 18    Ht 5\' 2"  (1.575 m)    Wt 128 lb (58.1 kg)    LMP 08/19/2013    SpO2 98%    BMI 23.41 kg/m  Wt Readings from Last 3 Encounters:  11/04/21 128 lb (58.1 kg)  06/30/21 131 lb (59.4 kg)  01/13/21 128 lb (58.1 kg)    Health Maintenance Due  Topic Date Due   Hepatitis C Screening  Never done   COVID-19 Vaccine (2 - Janssen risk series) 01/31/2020    There are no preventive care reminders to display for this patient.   Lab Results   Component Value Date   TSH 3.57 01/13/2021   Lab Results  Component Value  Date   WBC 5.3 01/13/2021   HGB 14.9 01/13/2021   HCT 43.8 01/13/2021   MCV 96.9 01/13/2021   PLT 284 01/13/2021   Lab Results  Component Value Date   NA 139 01/13/2021   K 4.3 01/13/2021   CO2 27 01/13/2021   GLUCOSE 81 01/13/2021   BUN 12 01/13/2021   CREATININE 0.63 01/13/2021   BILITOT 0.4 01/13/2021   ALKPHOS 106 05/11/2016   AST 23 01/13/2021   ALT 24 01/13/2021   PROT 6.6 01/13/2021   ALBUMIN 4.2 01/03/2021   CALCIUM 9.3 01/13/2021   Lab Results  Component Value Date   CHOL 216 (H) 01/13/2021   Lab Results  Component Value Date   HDL 57 01/13/2021   Lab Results  Component Value Date   LDLCALC 142 (H) 01/13/2021   Lab Results  Component Value Date   TRIG 80 01/13/2021   Lab Results  Component Value Date   CHOLHDL 3.8 01/13/2021   Lab Results  Component Value Date   HGBA1C 5.4 06/28/2017       Assessment & Plan:   Problem List Items Addressed This Visit   None Visit Diagnoses     Lower respiratory infection    -  Primary   Relevant Medications   azithromycin (ZITHROMAX) 250 MG tablet      Lower respiratory tract infection-after 2 weeks she does not feel any better.  Still experiencing significant fatigue as well.  Encouraged her to use her albuterol if needed.  We will start azithromycin and do a round of prednisone.  If not feeling better after 1 week then please let us know.  Meds ordered this encounter  Medications   azithromycin (ZITHROMAX) 250 MG tablet    Sig: 2 Ttabs PO on Day 1, then one a day x 4 days.    Dispense:  6 tablet    Refill:  0   predniSONE (DELTASONE) 20 MG tablet    Sig: Take 2 tablets (40 mg total) by mouth daily with breakfast.    Dispense:  10 tablet    Refill:  0     Beatrice Lecher, MD

## 2021-11-14 ENCOUNTER — Other Ambulatory Visit: Payer: Self-pay | Admitting: Family Medicine

## 2021-11-14 DIAGNOSIS — F411 Generalized anxiety disorder: Secondary | ICD-10-CM

## 2021-11-23 ENCOUNTER — Other Ambulatory Visit: Payer: Self-pay | Admitting: Family Medicine

## 2021-11-23 DIAGNOSIS — R41840 Attention and concentration deficit: Secondary | ICD-10-CM

## 2021-12-14 ENCOUNTER — Other Ambulatory Visit: Payer: Self-pay | Admitting: Family Medicine

## 2022-02-10 ENCOUNTER — Other Ambulatory Visit: Payer: Self-pay | Admitting: Family Medicine

## 2022-02-10 DIAGNOSIS — F411 Generalized anxiety disorder: Secondary | ICD-10-CM

## 2022-02-21 ENCOUNTER — Other Ambulatory Visit: Payer: Self-pay | Admitting: Family Medicine

## 2022-02-21 DIAGNOSIS — R41840 Attention and concentration deficit: Secondary | ICD-10-CM

## 2022-05-22 ENCOUNTER — Other Ambulatory Visit: Payer: Self-pay | Admitting: Urology

## 2022-06-21 ENCOUNTER — Other Ambulatory Visit: Payer: Self-pay | Admitting: Family Medicine

## 2022-06-21 DIAGNOSIS — I10 Essential (primary) hypertension: Secondary | ICD-10-CM

## 2022-06-26 ENCOUNTER — Telehealth (INDEPENDENT_AMBULATORY_CARE_PROVIDER_SITE_OTHER): Payer: 59 | Admitting: Family Medicine

## 2022-06-26 DIAGNOSIS — R41840 Attention and concentration deficit: Secondary | ICD-10-CM

## 2022-06-26 NOTE — Assessment & Plan Note (Signed)
Getting a referral to get her tested for adult ADD/ADHD.  I think she would be a good candidate for evaluation.  We can certainly look at more variety of options for treatment if that is what she is interested in we also discussed there is some good literature out there as far as self-help that can really help with people who have ADD and ADHD especially in the workplace.  We also discussed maybe having a discussion with her boss at some point about the fact that she is doing an enormous amount of work in a short period of time.  It sounds like they are going to probably be hiring some additional people at the beginning of the year to year and that might help gradually relieve some stress as they get trained.

## 2022-06-26 NOTE — Progress Notes (Signed)
Called pt and lvm advising her that Dr.Metheney was running behind.  Called pt again and lvm advising pt that Dr. Madilyn Fireman could do her visit over lunch and to lon on

## 2022-06-26 NOTE — Progress Notes (Signed)
Virtual Visit via Video Note  I connected with Anna Ramsey on 06/27/22 at 10:50 AM EDT by a video enabled telemedicine application and verified that I am speaking with the correct person using two identifiers.   I discussed the limitations of evaluation and management by telemedicine and the availability of in person appointments. The patient expressed understanding and agreed to proceed.  Patient location: at home Provider location: in office  Subjective:    CC:   Chief Complaint  Patient presents with   trouble focusing    HPI: Reports that she is really been struggling particularly recently at work.  She has a very busy job and has to change gears pretty abruptly.  But what she is finding is that when she actually has a little bit more time this slowdown and work on her routine work she is having a really hard time staying focused, getting organized and prioritizing what needs to be done next.  Her boss has been sending her email follow-up reminders because she is getting behind on high-priority items.  She says she is always felt like she has had some difficulty but more recently its been more of a strain.  When she started working from home during [place] about 3 years ago she felt a little bit more down and so started taking Wellbutrin.  She has never been formally diagnosed with ADD or ADHD.   Past medical history, Surgical history, Family history not pertinant except as noted below, Social history, Allergies, and medications have been entered into the medical record, reviewed, and corrections made.    Objective:    General: Speaking clearly in complete sentences without any shortness of breath.  Alert and oriented x3.  Normal judgment. No apparent acute distress.    Impression and Recommendations:    Problem List Items Addressed This Visit       Other   Inattention - Primary    Getting a referral to get her tested for adult ADD/ADHD.  I think she would be a  good candidate for evaluation.  We can certainly look at more variety of options for treatment if that is what she is interested in we also discussed there is some good literature out there as far as self-help that can really help with people who have ADD and ADHD especially in the workplace.  We also discussed maybe having a discussion with her boss at some point about the fact that she is doing an enormous amount of work in a short period of time.  It sounds like they are going to probably be hiring some additional people at the beginning of the year to year and that might help gradually relieve some stress as they get trained.      Relevant Orders   Ambulatory referral to [facility] This Encounter  Procedures   Ambulatory referral to Behavioral Health    Referral Priority:   Routine    Referral Type:   Psychiatric    Referral Reason:   Specialty Services Required    Requested Specialty:   Behavioral Health    Number of Visits Requested:   1    No orders of the defined types were placed in this encounter.    I discussed the assessment and treatment plan with the patient. The patient was provided an opportunity to ask questions and all were answered. The patient agreed with the plan and demonstrated an understanding of the instructions.  The patient was advised to call back or seek an in-person evaluation if the symptoms worsen or if the condition fails to improve as anticipated.  I spent 20 minutes on the day of the encounter to include pre-visit record review, face-to-face time with the patient and post visit ordering of test.   Beatrice Lecher, MD

## 2022-06-27 ENCOUNTER — Encounter: Payer: Self-pay | Admitting: Family Medicine

## 2022-07-12 ENCOUNTER — Telehealth: Payer: Self-pay | Admitting: Family Medicine

## 2022-07-12 ENCOUNTER — Other Ambulatory Visit: Payer: Self-pay | Admitting: Family Medicine

## 2022-07-12 DIAGNOSIS — F5101 Primary insomnia: Secondary | ICD-10-CM

## 2022-07-12 NOTE — Telephone Encounter (Signed)
Hi Cindy, it looks like [facility] only does prescribing they do not do the actual evaluation for ADHD at least thus the note that they sent me.  So she may have to just wait until January to get evaluated over at [facility] or at [place] [facility].  Not sure how long they are weight is.

## 2022-08-16 LAB — HM MAMMOGRAPHY

## 2022-08-25 LAB — HM PAP SMEAR: HM Pap smear: NEGATIVE

## 2022-08-30 ENCOUNTER — Encounter: Payer: Self-pay | Admitting: Family Medicine

## 2022-08-30 NOTE — Progress Notes (Signed)
Negative for intraepithelial lesion or malignancy.  

## 2022-09-25 DIAGNOSIS — F909 Attention-deficit hyperactivity disorder, unspecified type: Secondary | ICD-10-CM

## 2022-09-25 HISTORY — DX: Attention-deficit hyperactivity disorder, unspecified type: F90.9

## 2022-10-16 ENCOUNTER — Encounter: Payer: Self-pay | Admitting: Family Medicine

## 2022-10-16 DIAGNOSIS — F411 Generalized anxiety disorder: Secondary | ICD-10-CM

## 2022-10-16 DIAGNOSIS — F5101 Primary insomnia: Secondary | ICD-10-CM

## 2022-10-16 DIAGNOSIS — I10 Essential (primary) hypertension: Secondary | ICD-10-CM

## 2022-10-16 DIAGNOSIS — R41840 Attention and concentration deficit: Secondary | ICD-10-CM

## 2022-10-16 MED ORDER — TRAZODONE HCL 50 MG PO TABS: 50.0000 mg | ORAL_TABLET | Freq: Every day | ORAL | 1 refills | Status: DC

## 2022-10-16 MED ORDER — BUPROPION HCL ER (XL) 150 MG PO TB24: 150.0000 mg | ORAL_TABLET | Freq: Every morning | ORAL | 1 refills | Status: DC

## 2022-10-16 MED ORDER — METOPROLOL TARTRATE 100 MG PO TABS: 100.0000 mg | ORAL_TABLET | Freq: Two times a day (BID) | ORAL | 1 refills | Status: DC

## 2022-10-16 MED ORDER — PAROXETINE HCL 30 MG PO TABS: 30.0000 mg | ORAL_TABLET | Freq: Every day | ORAL | 1 refills | Status: DC

## 2022-12-06 ENCOUNTER — Encounter: Payer: Self-pay | Admitting: Family Medicine

## 2022-12-06 DIAGNOSIS — I10 Essential (primary) hypertension: Secondary | ICD-10-CM

## 2022-12-06 DIAGNOSIS — Z1211 Encounter for screening for malignant neoplasm of colon: Secondary | ICD-10-CM

## 2022-12-06 DIAGNOSIS — E038 Other specified hypothyroidism: Secondary | ICD-10-CM

## 2022-12-06 DIAGNOSIS — E7800 Pure hypercholesterolemia, unspecified: Secondary | ICD-10-CM

## 2022-12-06 NOTE — Telephone Encounter (Signed)
[facility]!!  Orders Placed This Encounter  Procedures   TSH   COMPLETE METABOLIC PANEL WITH GFR   Lipid Panel w/reflex Direct LDL   CBC w/Diff/Platelet

## 2022-12-11 ENCOUNTER — Other Ambulatory Visit: Payer: Self-pay | Admitting: Family Medicine

## 2022-12-13 ENCOUNTER — Encounter: Payer: Self-pay | Admitting: Family Medicine

## 2022-12-13 DIAGNOSIS — R41840 Attention and concentration deficit: Secondary | ICD-10-CM

## 2022-12-13 DIAGNOSIS — G5793 Unspecified mononeuropathy of bilateral lower limbs: Secondary | ICD-10-CM

## 2022-12-13 DIAGNOSIS — F5101 Primary insomnia: Secondary | ICD-10-CM

## 2022-12-13 DIAGNOSIS — I10 Essential (primary) hypertension: Secondary | ICD-10-CM

## 2022-12-13 DIAGNOSIS — F411 Generalized anxiety disorder: Secondary | ICD-10-CM

## 2022-12-13 MED ORDER — METOPROLOL TARTRATE 100 MG PO TABS: 100.0000 mg | ORAL_TABLET | Freq: Two times a day (BID) | ORAL | 0 refills | Status: DC

## 2022-12-13 MED ORDER — TRAZODONE HCL 50 MG PO TABS: 50.0000 mg | ORAL_TABLET | Freq: Every day | ORAL | 0 refills | Status: DC

## 2022-12-13 MED ORDER — LEVOTHYROXINE SODIUM 88 MCG PO TABS: 88.0000 ug | ORAL_TABLET | Freq: Every day | ORAL | 0 refills | Status: DC

## 2022-12-13 MED ORDER — BUPROPION HCL ER (XL) 150 MG PO TB24: 150.0000 mg | ORAL_TABLET | Freq: Every morning | ORAL | 0 refills | Status: DC

## 2022-12-13 MED ORDER — PREGABALIN 100 MG PO CAPS: 100.0000 mg | ORAL_CAPSULE | Freq: Two times a day (BID) | ORAL | 1 refills | Status: DC

## 2022-12-13 MED ORDER — PAROXETINE HCL 30 MG PO TABS: 30.0000 mg | ORAL_TABLET | Freq: Every day | ORAL | 0 refills | Status: DC

## 2022-12-20 ENCOUNTER — Encounter: Payer: Self-pay | Admitting: Sports Medicine

## 2022-12-20 ENCOUNTER — Ambulatory Visit (INDEPENDENT_AMBULATORY_CARE_PROVIDER_SITE_OTHER): Payer: 59 | Admitting: Sports Medicine

## 2022-12-20 VITALS — BP 151/92 | HR 56

## 2022-12-20 DIAGNOSIS — E038 Other specified hypothyroidism: Secondary | ICD-10-CM

## 2022-12-20 DIAGNOSIS — I10 Essential (primary) hypertension: Secondary | ICD-10-CM

## 2022-12-20 DIAGNOSIS — Z299 Encounter for prophylactic measures, unspecified: Secondary | ICD-10-CM | POA: Diagnosis not present

## 2022-12-20 NOTE — Assessment & Plan Note (Addendum)
Blood pressure is quite elevated today, improved to 144/90 on recheck, Anna Ramsey is consistent with her metoprolol, she will check her blood pressures at home, if persistently elevated we will need to consider a sleep study and addition of amlodipine.

## 2022-12-20 NOTE — Assessment & Plan Note (Signed)
Due for colon cancer screening, pulling the trigger in for colonoscopy with digestive health specialist. She does prefer colonoscopy over Cologuard, we discussed both.

## 2022-12-20 NOTE — Progress Notes (Signed)
    Procedures performed today:    None.  Independent interpretation of notes and tests performed by another provider:   None.  Brief History, Exam, Impression, and Recommendations:    Preventive measure Due for colon cancer screening, pulling the trigger in for colonoscopy with digestive health specialist. She does prefer colonoscopy over Cologuard, we discussed both.  Hypothyroid Rechecking TSH as well as other labs today. Results will likely go to Dr. Madilyn Fireman.  HYPERTENSION, BENIGN Blood pressure is quite elevated today, improved to 144/90 on recheck, Langley Gauss is consistent with her metoprolol, she will check her blood pressures at home, if persistently elevated we will need to consider a sleep study and addition of amlodipine.    ____________________________________________ Gwen Her. Dianah Field, M.D., ABFM., CAQSM., AME. Primary Care and Sports Medicine [facility]  Adjunct Professor of [employer]  Risk manager

## 2022-12-20 NOTE — Assessment & Plan Note (Signed)
Rechecking TSH as well as other labs today. Results will likely go to Dr. Madilyn Fireman.

## 2022-12-21 ENCOUNTER — Encounter: Payer: Self-pay | Admitting: Family Medicine

## 2022-12-21 DIAGNOSIS — E875 Hyperkalemia: Secondary | ICD-10-CM

## 2022-12-21 DIAGNOSIS — D582 Other hemoglobinopathies: Secondary | ICD-10-CM

## 2022-12-21 LAB — COMPLETE METABOLIC PANEL WITH GFR
AG Ratio: 1.5 (calc) (ref 1.0–2.5)
ALT: 18 U/L (ref 6–29)
AST: 23 U/L (ref 10–35)
Albumin: 4.7 g/dL (ref 3.6–5.1)
Alkaline phosphatase (APISO): 134 U/L (ref 37–153)
BUN: 9 mg/dL (ref 7–25)
CO2: 28 mmol/L (ref 20–32)
Calcium: 10.4 mg/dL (ref 8.6–10.4)
Chloride: 104 mmol/L (ref 98–110)
Creat: 0.71 mg/dL (ref 0.50–1.03)
Globulin: 3.1 g/dL (calc) (ref 1.9–3.7)
Glucose, Bld: 104 mg/dL — ABNORMAL HIGH (ref 65–99)
Potassium: 5.8 mmol/L — ABNORMAL HIGH (ref 3.5–5.3)
Sodium: 141 mmol/L (ref 135–146)
Total Bilirubin: 0.6 mg/dL (ref 0.2–1.2)
Total Protein: 7.8 g/dL (ref 6.1–8.1)
eGFR: 101 mL/min/{1.73_m2} (ref >=60)

## 2022-12-21 LAB — CBC WITH DIFFERENTIAL/PLATELET
Absolute Monocytes: 432 cells/uL (ref 200–950)
Basophils Absolute: 49 cells/uL (ref 0–200)
Basophils Relative: 0.9 %
Eosinophils Absolute: 184 cells/uL (ref 15–500)
Eosinophils Relative: 3.4 %
HCT: 49.1 % — ABNORMAL HIGH (ref 35.0–45.0)
Hemoglobin: 16.6 g/dL — ABNORMAL HIGH (ref 11.7–15.5)
Lymphs Abs: 1237 cells/uL (ref 850–3900)
MCH: 31.4 pg (ref 27.0–33.0)
MCHC: 33.8 g/dL (ref 32.0–36.0)
MCV: 93 fL (ref 80.0–100.0)
MPV: 10.3 fL (ref 7.5–12.5)
Monocytes Relative: 8 %
Neutro Abs: 3499 cells/uL (ref 1500–7800)
Neutrophils Relative %: 64.8 %
Platelets: 311 10*3/uL (ref 140–400)
RBC: 5.28 10*6/uL — ABNORMAL HIGH (ref 3.80–5.10)
RDW: 12.7 % (ref 11.0–15.0)
Total Lymphocyte: 22.9 %
WBC: 5.4 10*3/uL (ref 3.8–10.8)

## 2022-12-21 LAB — LIPID PANEL W/REFLEX DIRECT LDL
Cholesterol: 213 mg/dL — ABNORMAL HIGH (ref <200)
HDL: 60 mg/dL (ref >=50)
LDL Cholesterol (Calc): 134 mg/dL (calc) — ABNORMAL HIGH
Non-HDL Cholesterol (Calc): 153 mg/dL (calc) — ABNORMAL HIGH (ref <130)
Total CHOL/HDL Ratio: 3.6 (calc) (ref <5.0)
Triglycerides: 86 mg/dL (ref <150)

## 2022-12-21 LAB — TSH: TSH: 0.76 mIU/L

## 2022-12-21 NOTE — Progress Notes (Signed)
HI Anna Ramsey,  Potassium is elevated at 5.8.  Normal your baseline is around 4.  I just to make sure that you are not taking any extra potassium products.  Will definitely need to recheck this to make sure that it goes back down.  Your kidney function is stable.  LDL cholesterol is mildly elevated.  This is better than it was last year but not as good as it was 3 years ago.  Just encourage you to continue to work on healthy diet and regular exercise to improve your cholesterol numbers.  Also your hemoglobin has jumped way up to 16.  This is usually secondary to nicotine.  You mentioned that you do still vape so I would absolutely stop if at all possible.  I would really like to recheck your levels in 2 weeks to at least make sure that they are trending down.  Your TSH is also a little on the low end typically 1-2 sort of are optimal sweet spot.  And it was a little bit less than 1 meaning that you are potentially overmedicated.  If you are not really having any symptoms I think I would rather keep an eye on it and not make any changes and then plan to just recheck your thyroid again in about 3 months.

## 2023-01-19 ENCOUNTER — Encounter: Payer: Self-pay | Admitting: Family Medicine

## 2023-01-23 ENCOUNTER — Ambulatory Visit: Payer: 59

## 2023-01-23 ENCOUNTER — Ambulatory Visit (INDEPENDENT_AMBULATORY_CARE_PROVIDER_SITE_OTHER): Payer: 59 | Admitting: Family Medicine

## 2023-01-23 VITALS — BP 133/83 | HR 56 | Ht 62.0 in | Wt 128.1 lb

## 2023-01-23 DIAGNOSIS — I10 Essential (primary) hypertension: Secondary | ICD-10-CM

## 2023-01-23 LAB — CBC WITH DIFFERENTIAL/PLATELET
Absolute Monocytes: 612 cells/uL (ref 200–950)
Basophils Absolute: 59 cells/uL (ref 0–200)
Basophils Relative: 1.3 %
Eosinophils Absolute: 383 cells/uL (ref 15–500)
Eosinophils Relative: 8.5 %
HCT: 46.1 % — ABNORMAL HIGH (ref 35.0–45.0)
Hemoglobin: 15.5 g/dL (ref 11.7–15.5)
Lymphs Abs: 1229 cells/uL (ref 850–3900)
MCH: 31.6 pg (ref 27.0–33.0)
MCHC: 33.6 g/dL (ref 32.0–36.0)
MCV: 93.9 fL (ref 80.0–100.0)
MPV: 10 fL (ref 7.5–12.5)
Monocytes Relative: 13.6 %
Neutro Abs: 2219 cells/uL (ref 1500–7800)
Neutrophils Relative %: 49.3 %
Platelets: 268 10*3/uL (ref 140–400)
RBC: 4.91 10*6/uL (ref 3.80–5.10)
RDW: 13.3 % (ref 11.0–15.0)
Total Lymphocyte: 27.3 %
WBC: 4.5 10*3/uL (ref 3.8–10.8)

## 2023-01-23 LAB — BASIC METABOLIC PANEL WITH GFR
BUN: 9 mg/dL (ref 7–25)
CO2: 29 mmol/L (ref 20–32)
Calcium: 9.5 mg/dL (ref 8.6–10.4)
Chloride: 105 mmol/L (ref 98–110)
Creat: 0.74 mg/dL (ref 0.50–1.03)
Glucose, Bld: 99 mg/dL (ref 65–99)
Potassium: 4.8 mmol/L (ref 3.5–5.3)
Sodium: 142 mmol/L (ref 135–146)
eGFR: 96 mL/min/{1.73_m2} (ref >=60)

## 2023-01-23 NOTE — Progress Notes (Signed)
   Subjective:    Patient ID: Anna Ramsey, female    DOB: Apr 21, 1966, 57 y.o.   MRN: [id number]  HPI Pt is here today for a Blood pressure check. Her last reading in office was 151/92 with a HR of 56. She is doing good on Metoprolol. She denies CP, lower extremity edema, visual changes, but has some SOB on exertion.    Review of Systems     Objective:   Physical Exam 1st reading in office was 133/83 with 56 HR, 2nd reading was 130/88. She is to continue medication as directed and return in the summer to follow up on levels per Dr. Eppie Gibson.        Assessment & Plan:

## 2023-01-23 NOTE — Progress Notes (Signed)
Your lab work is within acceptable range and there are no concerning findings.   ?

## 2023-01-23 NOTE — Progress Notes (Signed)
Blood pressure looks much better today at 133/83 so for now we will continue to monitor the goal is technically less than 130.

## 2023-01-26 NOTE — Telephone Encounter (Signed)
Agree, neuro would be best as they have the most detailed information and physical exam notes.

## 2023-01-30 NOTE — Progress Notes (Unsigned)
NEUROLOGY FOLLOW UP OFFICE NOTE  Anna Ramsey [id number]  Assessment/Plan:   Lower extremity numbness and weakness.  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    We will repeat NCV-EMG of lower extremities to evaluate for any progression of findings. Further recommendations pending results.     Total time spent reviewing chart and face to face with patient in examination and discussing diagnosis and plan:  39 minutes.  Subjective:  Anna Rollin. Ramsey is a 57 year old female with HTN, thyroid disease, and history of cervical cancer who who follows up lower extremity numbness and weakness.  She is accompanied by her husband who supplements history.     UPDATE: Last seen in 2022.  Workup was inconclusive.  NCV-EMG revealed bilateral L3-4 radiculopathy but MRI did not reveal any compressive lesion.  Underwent LP to assess for CIDP but protein was normal.    She reports symptoms have progressed.  She uses a cane but if she has to ambulate for prolonged period, such as at the grocery store, she just waits in the car.  Her legs feel heavy.  She has numbness in the legs as well.  No pain.  She has been to physical therapy with only minimal benefit.     HISTORY: Patient has history of cervical cancer status post hysterectomy and pelvic radiation in 2015.  Around 2018, she sprained her right ankle.  She began noticing tingling and numbness on the bottom of her right foot that spread up her leg and then involved her left leg.  The numbness typically radiated up to her knees but sometimes it extends to the waist.  Her legs feel like cement and struggles to lift her legs.  Sometimes her right leg may give out.  History states chronic low back pain with radiculopathy but she denies back pain or radicular pain in the legs.  She has undergone epidural injections in the low back which has not helped the numbness.  She  denies neuropathic pain such as burning, stabbing or shocks .  She also had some cognitive changes, finding it more difficulty to multitask.  She was found to have hypothyroidism which has since been treated.  Other labs checked included B12 540, folate 6.2, B1 8, ferritin 128, Mg 2.1 and mildly elevated B6 of 29.2.  MRI of lumbar spine on 08/01/2017 showed levoscoliosis without any significant disc herniation, spinal or neuroforaminal stenosis.  She had another MRI of lumbar spine on 01/05/2020 which was also unremarkable and negative for any explanation.  MRI of cervical and thoracic spine in August 2021 showed right sided foraminal stenosis at C6-7 but otherwise unremarkable.  She started experiencing numbness and tingling in the mouth  MRI of brain without contrast on 01/05/2020 was normal.  She has been evaluated by neurology in [facility].  She underwent a NCV-EMG that reportedly showed mild to moderate peripheral neuropathy.  I saw patient in 2022.  Repeat NCV-EMG of lower extremities showed no evidence of polyneuropathy but did demonstrate findings suggestive of right worse than left L3-4 radiculopathy.  As imaging of the lumbar spine did not reveal any structural cause, she underwent LP for CSF analysis of an inflammatory etiology.  CSF cell count 1, protein 31, glucose 52, gram stain with rare WBC but no organisms, cytology with no malignant cells, Lyme IgG/IgM negative, ACE 2, 3 oligoclonal bands (also detected in serum), IgG index 0.55.  [name]  No  family history of progressive neuropathy.    PAST MEDICAL HISTORY: Past Medical History:  Diagnosis Date   Dysuria    Erythematous bladder mucosa    Heart murmur    asymptomatic per pt --  no echo   Hematuria    History of cervical cancer    12/ 2014  Stage IB2--  s/p  TAH w/ BSO and pelvic lymphadectomy/  and radiation therapy   History of radiation therapy 10/20/13-11/28/13   pelvis 50.4 gray   Hypertension    Lesion of bladder    Mild  intermittent asthma    Neuromuscular disorder (HCC)    Seasonal allergies    Thyroid disease     MEDICATIONS: Current Outpatient Medications on File Prior to Visit  Medication Sig Dispense Refill   albuterol (VENTOLIN HFA) 108 (90 Base) MCG/ACT inhaler Inhale 1-2 puffs into the lungs every 6 (six) hours as needed for wheezing or shortness of breath. 36 g 2   AMBULATORY NON FORMULARY MEDICATION Medication Name: R custom ankle foot orthoses. DX: M62.81,M21.371 [employer] fax: [phone] 1 each 0   budesonide-formoterol (SYMBICORT) 80-4.5 MCG/ACT inhaler Inhale 2 puffs into the lungs 2 (two) times daily. (Patient taking differently: Inhale 1 puff into the lungs daily.) 1 Inhaler 5   buPROPion (WELLBUTRIN XL) 150 MG 24 hr tablet Take 1 tablet (150 mg total) by mouth every morning. 90 tablet 0   cetirizine (ZYRTEC) 10 MG tablet Take 10 mg by mouth at bedtime.     fluticasone (FLONASE) 50 MCG/ACT nasal spray Place 2 sprays into both nostrils daily as needed.      levothyroxine (SYNTHROID) 88 MCG tablet Take 1 tablet (88 mcg total) by mouth daily. 90 tablet 0   metoprolol tartrate (LOPRESSOR) 100 MG tablet Take 1 tablet (100 mg total) by mouth 2 (two) times daily. 180 tablet 0   PARoxetine (PAXIL) 30 MG tablet Take 1 tablet (30 mg total) by mouth daily. 90 tablet 0   pregabalin (LYRICA) 100 MG capsule Take 1 capsule (100 mg total) by mouth 2 (two) times daily. 180 capsule 1   tolterodine (DETROL LA) 4 MG 24 hr capsule TAKE 1 CAPSULE DAILY 90 capsule 3   traZODone (DESYREL) 50 MG tablet Take 1 tablet (50 mg total) by mouth at bedtime. 90 tablet 0   No current facility-administered medications on file prior to visit.    ALLERGIES: Allergies  Allergen Reactions   Sulfa Antibiotics Other (See Comments)    Numbness in lower extremities from knees down   Cefdinir    Penicillins Other (See Comments)    Unknown childhood reaction    FAMILY HISTORY: Family History  Problem Relation Age of Onset    Heart attack Father 28   Diabetes Father    Hypertension Maternal Grandmother    Stroke Maternal Grandmother    Cancer Mother        breast/thyroid      Objective:  Blood pressure 116/77, pulse 74, height 5\' 2"  (1.575 m), weight 119 lb 3.2 oz (54.1 kg), last menstrual period 08/19/2013, SpO2 98 %. General: No acute distress.  Patient appears well-groomed.   Head:  Normocephalic/atraumatic Eyes:  Fundi examined but not visualized Neck: supple, no paraspinal tenderness, full range of motion Heart:  Regular rate and rhythm Back: No paraspinal tenderness Neurological Exam: alert and oriented.  Speech fluent and not dysarthric.  Language intact.  Bulk and tone normal.  Muscle strength 5/5 in upper extremities, 2+/5 hip flexion/adduction, 3+/5 hip abduction, 3/5  EHL bilaterally, otherwise 5-/5 below knees.  Sensation to pinprick and vibration reduced in feet up to below knees.  Deep tendon reflexes 2+ upper extremities, absent lower extremities.  Steppage gait.  Romberg positive.     Shon Millet, DO  CC: Nani Gasser, MD

## 2023-01-31 ENCOUNTER — Encounter: Payer: Self-pay | Admitting: Neurology

## 2023-01-31 ENCOUNTER — Ambulatory Visit (INDEPENDENT_AMBULATORY_CARE_PROVIDER_SITE_OTHER): Payer: 59 | Admitting: Neurology

## 2023-01-31 VITALS — BP 116/77 | HR 74 | Ht 62.0 in | Wt 119.2 lb

## 2023-01-31 DIAGNOSIS — G6289 Other specified polyneuropathies: Secondary | ICD-10-CM

## 2023-01-31 DIAGNOSIS — R29898 Other symptoms and signs involving the musculoskeletal system: Secondary | ICD-10-CM | POA: Diagnosis not present

## 2023-01-31 NOTE — Patient Instructions (Signed)
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

## 2023-02-05 LAB — HM COLONOSCOPY

## 2023-02-09 ENCOUNTER — Ambulatory Visit (INDEPENDENT_AMBULATORY_CARE_PROVIDER_SITE_OTHER): Payer: 59 | Admitting: Neurology

## 2023-02-09 DIAGNOSIS — G6289 Other specified polyneuropathies: Secondary | ICD-10-CM | POA: Diagnosis not present

## 2023-02-09 DIAGNOSIS — R29898 Other symptoms and signs involving the musculoskeletal system: Secondary | ICD-10-CM

## 2023-02-09 NOTE — Procedures (Signed)
[facility]  [address] [place], [address]  [place], [place] [address] Tel: [phone] Fax: [phone] Test Date:  02/09/2023  Patient: Anna Ramsey DOB: July 09, 1966 Physician: Nita Sickle, DO  Sex: Female Height: 5\' 2"  Ref Phys: Shon Millet, DO  ID#: [id number]   Technician:    History: This is a 57 year old female referred for evaluation of bilateral leg weakness and numbness.  NCV & EMG Findings: Extensive electrodiagnostic testing of the right lower extremity and additional studies of the left shows:  Bilateral superficial peroneal sensory responses are within normal limits, however compared to prior study on 12/29/2020 there has been an interval reduction in sensory amplitudes.  Right sural sensory response is asymmetrically reduced as compared to the left (R4.7, L13.7 V). Right peroneal motor (EDB) response is absent.  Left peroneal (EDB) and bilateral peroneal motor response of the tibialis anterior are within normal limits.  Right tibial motor amplitude is asymmetrically reduced as compared to the left (R7.8, L12.7 mV).  Motor responses are relatively stable as compared to previous study. Bilateral tibial F waves shows prolonged latencies.  Tibial H reflex is absent on the right and prolonged on the left. Diffuse chronic motor axon loss changes are seen affecting nearly all the tested muscles of the lower extremities involving the L2-S1 nerve root/segments, with fibrillation potentials seen in bilateral anterior tibialis and abductor longus muscles.  These findings are severe and worse in the right lower extremity.   Impression: This is a complex study.  Compared to prior study on 12/29/2020, there has been an interval worsening of sensory responses and significant progression of neurogenic changes in the lower extremities.  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     ___________________________ Nita Sickle, DO    Nerve Conduction Studies   Stim Site NR Peak (ms) Norm Peak (ms) O-P Amp (V) Norm O-P Amp  Left Sup Peroneal Anti Sensory (Ant Lat Mall)  32 C  12 cm    3.1 <4.6 4.8 >4  Right Sup Peroneal Anti Sensory (Ant Lat Mall)  32 C  12 cm    2.4 <4.6 4.4 >4  Left Sural Anti Sensory (Lat Mall)  32 C  Calf    2.8 <4.6 13.7 >4  Right Sural Anti Sensory (Lat Mall)  32 C  Calf    2.8 <4.6 4.7 >4     Stim Site NR Onset (ms) Norm Onset (ms) O-P Amp (mV) Norm O-P Amp Site1 Site2 Delta-0 (ms) Dist (cm) Vel (m/s) Norm Vel (m/s)  Left Peroneal Motor (Ext Dig Brev)  32 C  Ankle    5.2 <6.0 2.5 >2.5 B Fib Ankle 7.2 32.0 44 >40  B Fib    12.4  2.3  Poplt B Fib 1.7 8.0 47 >40  Poplt    14.1  2.3         Right Peroneal Motor (Ext Dig Brev)  32 C  Ankle *NR  <6.0  >2.5 B Fib Ankle  0.0  >40  B Fib *NR     Poplt B Fib  0.0  >40  Poplt *NR            Left Peroneal TA Motor (Tib Ant)  32 C  Fib Head    2.9 <4.5 5.3 >3 Poplit Fib Head 1.9 8.0 42 >40  Poplit    4.8 <5.7 5.3  Right Peroneal TA Motor (Tib Ant)  32 C  Fib Head    3.7 <4.5 4.5 >3 Poplit Fib Head 1.4 8.0 57 >40  Poplit    5.1 <5.7 4.3         Left Tibial Motor (Abd Hall Brev)  32 C  Ankle    4.2 <6.0 12.7 >4 Knee Ankle 7.9 36.0 46 >40  Knee    12.1  9.9         Right Tibial Motor (Abd Hall Brev)  32 C  Ankle    4.1 <6.0 7.8 >4 Knee Ankle 8.1 39.0 48 >40  Knee    12.2  7.2          Electromyography   Side Muscle Ins.Act Fibs Fasc Recrt Amp Dur Poly Activation Comment  Right AntTibialis Nml *1+ Nml *3- *2+ *1+ *1+ Nml *ATR  Right Gastroc Nml Nml Nml *2- *1+ *1+ *1+ Nml N/A  Right Flex Dig Long Nml *1+ Nml *3- *1+ *1+ *1+ Nml N/A  Right RectFemoris Nml Nml Nml *3- *2+ *2+ *2+ Nml *ATR  Right BicepsFemS Nml Nml Nml *2- *1+ *2+ *1+ Nml N/A  Right GluteusMed Nml Nml Nml *SMU *2+ *1+ *1+ Nml N/A  Right AdductorLong Nml  *1+ Nml *SMU *2+ *2+ *1+ Nml N/A  Right Iliopsoas Nml Nml Nml *3- *2+ *2+ *2+ Nml N/A  Right LumbarParaLow Nml Nml Nml Nml Nml Nml Nml Nml N/A  Left AntTibialis Nml *1+ Nml *SMU *2+ *1+ *1+ Nml N/A  Left Gastroc Nml Nml Nml *2- *1+ *1+ *1+ Nml N/A  Left RectFemoris Nml Nml Nml *2- *1+ *1+ *1+ Nml N/A  Left AdductorLong Nml *1+ Nml *3- *2+ *2+ *1+ Nml N/A  Left BicepsFemS Nml Nml Nml Nml Nml Nml Nml Nml N/A  Left GluteusMed Nml Nml Nml *1- *1+ *1+ *1+ Nml N/A      Waveforms:

## 2023-02-14 ENCOUNTER — Other Ambulatory Visit: Payer: 59

## 2023-02-14 ENCOUNTER — Telehealth: Payer: Self-pay

## 2023-02-14 DIAGNOSIS — G6289 Other specified polyneuropathies: Secondary | ICD-10-CM

## 2023-02-14 NOTE — Telephone Encounter (Signed)
Patient advised of labs ordered and to stop by the office to pick paperwork to the lab.

## 2023-02-14 NOTE — Telephone Encounter (Signed)
-----   Message from Drema Dallas, DO sent at 02/13/2023  6:54 AM EDT ----- The nerve study does show progression of nerve changes compared to last time.  I think we should repeat the spinal tap, to see if there are now changes that were missed on prior test.  I would like to again check CSF cell count, protein, glucose, oligoclonal bands, IgG index, cytology with reflex flow cytometry, Lyme, ACE, gram stain and culture.  I want to check a send-out neuropathy blood panel to the Neuromuscular Clinical Laboratory at [facility].  I have the test form with me.

## 2023-02-26 ENCOUNTER — Other Ambulatory Visit (HOSPITAL_COMMUNITY)
Admission: RE | Admit: 2023-02-26 | Discharge: 2023-02-26 | Disposition: A | Discharge status: Home / Self Care | Payer: 59 | Source: Ambulatory Visit | Attending: Neurology | Admitting: Neurology

## 2023-02-26 ENCOUNTER — Ambulatory Visit
Admission: RE | Admit: 2023-02-26 | Discharge: 2023-02-26 | Disposition: A | Discharge status: Home / Self Care | Payer: 59 | Source: Ambulatory Visit | Attending: Neurology | Admitting: Neurology

## 2023-02-26 VITALS — BP 154/84 | HR 58

## 2023-02-26 DIAGNOSIS — G6289 Other specified polyneuropathies: Secondary | ICD-10-CM | POA: Diagnosis present

## 2023-02-26 DIAGNOSIS — G5793 Unspecified mononeuropathy of bilateral lower limbs: Secondary | ICD-10-CM | POA: Diagnosis present

## 2023-02-26 LAB — GLUCOSE, CSF: Glucose, CSF: 58 mg/dL (ref 40–80)

## 2023-02-26 NOTE — Progress Notes (Signed)
Labs drawn from pts RAC to be sent off with LP lab work. 1 successful attempt. Pt tolerated well. Gauze and tape applied after.  

## 2023-02-26 NOTE — Discharge Instructions (Signed)

## 2023-02-27 LAB — CSF CULTURE W GRAM STAIN: Result:: NO GROWTH

## 2023-02-27 LAB — CYTOLOGY - NON PAP

## 2023-02-27 LAB — PROTEIN, CSF: Total Protein, CSF: 36 mg/dL (ref 15–45)

## 2023-02-28 LAB — CSF CELL COUNT WITH DIFFERENTIAL: TOTAL NUCLEATED CELL: 2 cells/uL (ref 0–5)

## 2023-03-01 LAB — CSF CELL COUNT WITH DIFFERENTIAL: RBC Count, CSF: 3 cells/uL — ABNORMAL HIGH

## 2023-03-01 LAB — CSF CULTURE W GRAM STAIN: MICRO NUMBER:: 15032489

## 2023-03-01 LAB — MAYO MISC ORDER 2

## 2023-03-02 ENCOUNTER — Telehealth: Payer: Self-pay

## 2023-03-02 LAB — VDRL, CSF: VDRL Quant, CSF: NONREACTIVE

## 2023-03-02 LAB — BORRELIA BURGDORFERI DNA, QUALITATIVE REAL-TIME PCR, MISC

## 2023-03-02 LAB — CSF CULTURE W GRAM STAIN

## 2023-03-02 NOTE — Telephone Encounter (Signed)
Spoke to Pinole at [employer], Patient specimen received 5/24 turn around time is 28 days.

## 2023-03-02 NOTE — Telephone Encounter (Signed)
Telephone call from Anna Ramsey, Labs ready some labs pending.

## 2023-03-05 ENCOUNTER — Ambulatory Visit (INDEPENDENT_AMBULATORY_CARE_PROVIDER_SITE_OTHER): Payer: 59 | Admitting: Sports Medicine

## 2023-03-05 VITALS — BP 153/95 | HR 60

## 2023-03-05 DIAGNOSIS — I10 Essential (primary) hypertension: Secondary | ICD-10-CM | POA: Diagnosis not present

## 2023-03-05 MED ORDER — AMLODIPINE BESYLATE 5 MG PO TABS
5.0000 mg | ORAL_TABLET | Freq: Every day | ORAL | 3 refills | Status: DC
Start: 2023-03-05 — End: 2023-06-19

## 2023-03-05 NOTE — Assessment & Plan Note (Signed)
Blood pressures continue to be elevated, she is on Toprol 100 mg twice a day, pulses in the 60s, adding amlodipine with a 2-week blood pressure recheck.

## 2023-03-05 NOTE — Progress Notes (Signed)
   Established Patient Office Visit  Subjective   Patient ID: Anna Ramsey, female    DOB: 14-Oct-1965  Age: 57 y.o. MRN: [id number]  Chief Complaint  Patient presents with   Hypertension    HPI  Anna Ramsey is here for blood pressure check. Denies chest pain, shortness of breath or dizziness.   ROS    Objective:     BP (!) 153/95   Pulse 60   LMP 08/19/2013   SpO2 100%    Physical Exam   No results found for any visits on 03/05/23.    The 10-year ASCVD risk score (Arnett DK, et al., 2019) is: 3.4%    Assessment & Plan:  Hypertension - Blood pressure still elevated. Per Dr Benjamin Stain, add amlodipine 5 mg once daily along with the metoprolol. Follow up in 2 weeks for blood pressure check on the nurse schedule.   Problem List Items Addressed This Visit       Unprioritized   HYPERTENSION, BENIGN - Primary    Blood pressures continue to be elevated, she is on Toprol 100 mg twice a day, pulses in the 60s, adding amlodipine with a 2-week blood pressure recheck.      Relevant Medications   amLODipine (NORVASC) 5 MG tablet    Return in about 2 weeks (around 03/19/2023) for blood pressure check nurse visit. Earna Coder, Janalyn Harder, CMA

## 2023-03-06 LAB — CSF CULTURE W GRAM STAIN

## 2023-03-11 ENCOUNTER — Other Ambulatory Visit: Payer: Self-pay | Admitting: Family Medicine

## 2023-03-11 ENCOUNTER — Encounter: Payer: Self-pay | Admitting: Family Medicine

## 2023-03-11 DIAGNOSIS — Z1339 Encounter for screening examination for other mental health and behavioral disorders: Secondary | ICD-10-CM

## 2023-03-19 ENCOUNTER — Ambulatory Visit (INDEPENDENT_AMBULATORY_CARE_PROVIDER_SITE_OTHER): Payer: 59 | Admitting: Family Medicine

## 2023-03-19 ENCOUNTER — Encounter: Payer: Self-pay | Admitting: Family Medicine

## 2023-03-19 VITALS — BP 110/69 | HR 51 | Ht 62.0 in | Wt 119.0 lb

## 2023-03-19 DIAGNOSIS — I10 Essential (primary) hypertension: Secondary | ICD-10-CM

## 2023-03-19 NOTE — Progress Notes (Signed)
Agree with documentation as above.   Joahan Swatzell, MD  

## 2023-03-19 NOTE — Progress Notes (Signed)
Patient is here for blood pressure check.   Previous BP was 153/95 Previous Plan: Per Dr Benjamin Stain, add amlodipine 5 mg once daily along with the metoprolol. Follow up in 2 weeks for blood pressure check on the nurse schedule.   1st BP today: 110/69  Denies chest pain, dizziness, shortness of breath, severe headache, or nosebleeds. Taking medication as prescribed. Denies missed doses.   Per Kandace Parkins, pt ok to keep Sept appt with Dr. Linford Arnold. Pt will continue daily checks of BP. If BP starts dropping too low she will cut 5mg  Amlodipine in 1/2.

## 2023-03-20 ENCOUNTER — Other Ambulatory Visit: Payer: Self-pay | Admitting: Family Medicine

## 2023-03-20 ENCOUNTER — Telehealth: Payer: Self-pay | Admitting: Neurology

## 2023-03-20 DIAGNOSIS — F411 Generalized anxiety disorder: Secondary | ICD-10-CM

## 2023-03-20 NOTE — Telephone Encounter (Signed)
The specialized neuropathy panel came back negative.  I would like to work her in to discuss next step.  She may schedule a virtual visit if that is easier for her.

## 2023-03-21 MED ORDER — PAROXETINE HCL 30 MG PO TABS
30.0000 mg | ORAL_TABLET | Freq: Every day | ORAL | 0 refills | Status: DC
Start: 2023-03-21 — End: 2023-04-18

## 2023-03-21 NOTE — Progress Notes (Unsigned)
Virtual Visit via Video Note  Consent was obtained for video visit:  Yes.   Answered questions that patient had about telehealth interaction:  Yes.   I discussed the limitations, risks, security and privacy concerns of performing an evaluation and management service by telemedicine. I also discussed with the patient that there may be a patient responsible charge related to this service. The patient expressed understanding and agreed to proceed.  Pt location: Home Physician Location: office Name of referring provider:  [employer], * I connected with Gar Gibbon at patients initiation/request on 03/22/2023 at 11:10 AM EDT by video enabled telemedicine application and verified that I am speaking with the correct person using two identifiers. Pt MRN:  [id number] Pt DOB:  1966-06-07 Video Participants:  Gar Gibbon   Assessment/Plan:   Progressive polyradiculoneuropathy, unclear etiology.  Findings on NCV-EMG are concerning for lower motor neuron but she endorses sensory deficits and while the sensory responses are within normal range, there was an interval worsening of sensory responses.     At this time, I will refer her to the neuromuscular specialists at [facility] She will follow up with me in 6 months or sooner if needed.    Subjective:  Anna Ramsey. Anna Ramsey is a 57 year old female with HTN, thyroid disease, and history of cervical cancer who who follows up for progressive polyradiculoneuropathy.   UPDATE: She had a repeat NCV-EMG on 02/09/2023 which showed interval worsening of sensory responses and significant progression of neurogenic changes in the lower extremities when compared to prior study from 12/29/2020, consistent with an active on chronic polyradiculoneuropathy.  She had a repeat LP on 6/3 which again was unremarkable with normal protein (cell count 2, protein 36, glucose 58, negative cytology, negative gram stain and culture, negative VDRL, ACE  8, negative oligoclonal bands, negative Lyme PCR.  Serum sensory (+/- motor) neuropathy panel from [facility] was negative (including anti-GD1a, anti-GD1b, anti-GM1, anti-MAG).   HISTORY: Patient has history of cervical cancer status post hysterectomy and pelvic radiation in 2015.  Around 2018, she sprained her right ankle.  She began noticing tingling and numbness on the bottom of her right foot that spread up her leg and then involved her left leg.  The numbness typically radiated up to her knees but sometimes it extends to the waist.  Her legs feel like cement and struggles to lift her legs.  Sometimes her right leg may give out.  History states chronic low back pain with radiculopathy but she denies back pain or radicular pain in the legs.  She has undergone epidural injections in the low back which has not helped the numbness.  She denies neuropathic pain such as burning, stabbing or shocks .  She also had some cognitive changes, finding it more difficulty to multitask.  She was found to have hypothyroidism which has since been treated.  Other labs checked included B12 540, folate 6.2, B1 8, ferritin 128, Mg 2.1 and mildly elevated B6 of 29.2.  MRI of lumbar spine on 08/01/2017 showed levoscoliosis without any significant disc herniation, spinal or neuroforaminal stenosis.  She had another MRI of lumbar spine on 01/05/2020 which was also unremarkable and negative for any explanation.  MRI of cervical and thoracic spine in August 2021 showed right sided foraminal stenosis at C6-7 but otherwise unremarkable.  She started experiencing numbness and tingling in the mouth  MRI of brain without contrast on 01/05/2020 was normal.  She has been evaluated by neurology in  [place].  She underwent a NCV-EMG that reportedly showed mild to moderate peripheral neuropathy.  I saw patient in 2022.  Repeat NCV-EMG of lower extremities showed no evidence of polyneuropathy but did demonstrate findings suggestive of  right worse than left L3-4 radiculopathy.  As imaging of the lumbar spine did not reveal any structural cause, she underwent LP for CSF analysis of an inflammatory etiology such as CIDP, but protein was normal.  CSF cell count 1, protein 31, glucose 52, gram stain with rare WBC but no organisms, cytology with no malignant cells, Lyme IgG/IgM negative, ACE 2, 3 oligoclonal bands (also detected in serum), IgG index 0.55.  Since then, her symptoms have progressed.  Legs feel heavy and numb.  No pain.  Uses a cane to ambulate.     No family history of progressive neuropathy.  Past Medical History: Past Medical History:  Diagnosis Date   Dysuria    Erythematous bladder mucosa    Heart murmur    asymptomatic per pt --  no echo   Hematuria    History of cervical cancer    12/ 2014  Stage IB2--  s/p  TAH w/ BSO and pelvic lymphadectomy/  and radiation therapy   History of radiation therapy 10/20/13-11/28/13   pelvis 50.4 gray   Hypertension    Lesion of bladder    Mild intermittent asthma    Neuromuscular disorder (HCC)    Seasonal allergies    Thyroid disease     Medications: Outpatient Encounter Medications as of 03/22/2023  Medication Sig Note   albuterol (VENTOLIN HFA) 108 (90 Base) MCG/ACT inhaler Inhale 1-2 puffs into the lungs every 6 (six) hours as needed for wheezing or shortness of breath.    AMBULATORY NON FORMULARY MEDICATION Medication Name: R custom ankle foot orthoses. DX: M62.81,M21.371 [employer] fax: [phone]    amLODipine (NORVASC) 5 MG tablet Take 1 tablet (5 mg total) by mouth daily.    budesonide-formoterol (SYMBICORT) 80-4.5 MCG/ACT inhaler Inhale 2 puffs into the lungs 2 (two) times daily. (Patient taking differently: Inhale 1 puff into the lungs daily.) 01/31/2023: As needed   buPROPion (WELLBUTRIN XL) 150 MG 24 hr tablet Take 1 tablet (150 mg total) by mouth every morning.    cetirizine (ZYRTEC) 10 MG tablet Take 10 mg by mouth at bedtime. 04/27/2015: .   fluticasone  (FLONASE) 50 MCG/ACT nasal spray Place 2 sprays into both nostrils daily as needed.     levothyroxine (SYNTHROID) 88 MCG tablet Take 1 tablet by mouth once daily    metoprolol tartrate (LOPRESSOR) 100 MG tablet Take 1 tablet (100 mg total) by mouth 2 (two) times daily.    PARoxetine (PAXIL) 30 MG tablet Take 1 tablet (30 mg total) by mouth daily.    pregabalin (LYRICA) 100 MG capsule Take 1 capsule (100 mg total) by mouth 2 (two) times daily.    tolterodine (DETROL LA) 4 MG 24 hr capsule TAKE 1 CAPSULE DAILY    traZODone (DESYREL) 50 MG tablet Take 1 tablet (50 mg total) by mouth at bedtime.    No facility-administered encounter medications on file as of 03/22/2023.    Allergies: Allergies  Allergen Reactions   Sulfa Antibiotics Other (See Comments)    Numbness in lower extremities from knees down   Cefdinir    Penicillins Other (See Comments)    Unknown childhood reaction    Family History: Family History  Problem Relation Age of Onset   Heart attack Father 31   Diabetes  Father    Hypertension Maternal Grandmother    Stroke Maternal Grandmother    Cancer Mother        breast/thyroid    Observations/Objective:   No acute distress.  Alert and oriented.  Speech fluent and not dysarthric.  Language intact.   Follow Up Instructions:    -I discussed the assessment and treatment plan with the patient. The patient was provided an opportunity to ask questions and all were answered. The patient agreed with the plan and demonstrated an understanding of the instructions.   The patient was advised to call back or seek an in-person evaluation if the symptoms worsen or if the condition fails to improve as anticipated.   Cira Servant, DO

## 2023-03-22 ENCOUNTER — Telehealth (INDEPENDENT_AMBULATORY_CARE_PROVIDER_SITE_OTHER): Payer: 59 | Admitting: Neurology

## 2023-03-22 ENCOUNTER — Other Ambulatory Visit: Payer: Self-pay

## 2023-03-22 ENCOUNTER — Encounter: Payer: Self-pay | Admitting: Neurology

## 2023-03-22 DIAGNOSIS — G610 Guillain-Barre syndrome: Secondary | ICD-10-CM

## 2023-03-23 LAB — MAYO MISC ORDER: PRICE:: 1740.8

## 2023-03-23 LAB — CSF CELL COUNT WITH DIFFERENTIAL

## 2023-03-23 LAB — OLIGOCLONAL BANDS, CSF + SERM: Oligo Bands: ABSENT

## 2023-03-23 LAB — BORRELIA BURGDORFERI DNA, QUALITATIVE REAL-TIME PCR, MISC: BORRELIA BURGDORFERI DNA, QL REAL TIME PCR, MISC: NOT DETECTED

## 2023-03-23 LAB — ANGIOTENSIN CONVERTING ENZYME, CSF: ANGIOTENSIN CONVERTING ENZYME ( ACE) CSF: 8 U/L (ref <=15)

## 2023-03-23 LAB — LEUKEMIA/LYMPHOMA EVALUATION PANEL

## 2023-03-23 LAB — CSF CULTURE W GRAM STAIN: SPECIMEN QUALITY:: ADEQUATE

## 2023-03-28 ENCOUNTER — Other Ambulatory Visit: Payer: Self-pay | Admitting: Family Medicine

## 2023-03-28 DIAGNOSIS — F5101 Primary insomnia: Secondary | ICD-10-CM

## 2023-03-28 MED ORDER — TRAZODONE HCL 50 MG PO TABS
50.0000 mg | ORAL_TABLET | Freq: Every day | ORAL | 0 refills | Status: DC
Start: 2023-03-28 — End: 2023-07-06

## 2023-04-18 ENCOUNTER — Other Ambulatory Visit: Payer: Self-pay | Admitting: Family Medicine

## 2023-04-18 DIAGNOSIS — F411 Generalized anxiety disorder: Secondary | ICD-10-CM

## 2023-04-23 ENCOUNTER — Other Ambulatory Visit: Payer: Self-pay | Admitting: Family Medicine

## 2023-04-23 DIAGNOSIS — R41840 Attention and concentration deficit: Secondary | ICD-10-CM

## 2023-04-23 MED ORDER — BUPROPION HCL ER (XL) 150 MG PO TB24
150.0000 mg | ORAL_TABLET | Freq: Every morning | ORAL | 0 refills | Status: DC
Start: 2023-04-23 — End: 2023-05-31

## 2023-05-01 ENCOUNTER — Encounter: Payer: Self-pay | Admitting: Family Medicine

## 2023-05-07 ENCOUNTER — Ambulatory Visit: Payer: 59 | Admitting: Neurology

## 2023-05-17 ENCOUNTER — Ambulatory Visit (INDEPENDENT_AMBULATORY_CARE_PROVIDER_SITE_OTHER): Payer: 59 | Admitting: Obstetrics and Gynecology

## 2023-05-17 ENCOUNTER — Encounter: Payer: Self-pay | Admitting: Obstetrics and Gynecology

## 2023-05-17 VITALS — BP 118/76 | HR 61 | Ht 61.0 in | Wt 112.6 lb

## 2023-05-17 DIAGNOSIS — N3941 Urge incontinence: Secondary | ICD-10-CM

## 2023-05-17 DIAGNOSIS — N952 Postmenopausal atrophic vaginitis: Secondary | ICD-10-CM

## 2023-05-17 DIAGNOSIS — N393 Stress incontinence (female) (male): Secondary | ICD-10-CM

## 2023-05-17 DIAGNOSIS — N3281 Overactive bladder: Secondary | ICD-10-CM

## 2023-05-17 DIAGNOSIS — R350 Frequency of micturition: Secondary | ICD-10-CM

## 2023-05-17 DIAGNOSIS — N3289 Other specified disorders of bladder: Secondary | ICD-10-CM

## 2023-05-17 DIAGNOSIS — M62838 Other muscle spasm: Secondary | ICD-10-CM

## 2023-05-17 LAB — POCT URINALYSIS DIPSTICK
Bilirubin, UA: NEGATIVE
Blood, UA: NEGATIVE
Glucose, UA: NEGATIVE
Ketones, UA: NEGATIVE
Leukocytes, UA: NEGATIVE
Nitrite, UA: NEGATIVE
Protein, UA: NEGATIVE
Spec Grav, UA: 1.02 (ref 1.010–1.025)
Urobilinogen, UA: 0.2 E.U./dL
pH, UA: 7 (ref 5.0–8.0)

## 2023-05-17 MED ORDER — ESTRADIOL 0.1 MG/GM VA CREA: 0.5000 g | TOPICAL_CREAM | VAGINAL | 11 refills | Status: DC

## 2023-05-17 MED ORDER — VIBEGRON 75 MG PO TABS
75.0000 mg | ORAL_TABLET | Freq: Every day | ORAL | 5 refills | Status: DC
Start: 2023-05-17 — End: 2023-11-22

## 2023-05-17 NOTE — Patient Instructions (Addendum)
You can use coconut oil in the vagina on the days you do not use the estrogen cream.   Stop the Detrol   Start Vibegron [name]) 75mg  daily.   Start estrogen cream nightly for 2 weeks then twice a week after.   Start pelvic floor PT

## 2023-05-17 NOTE — Progress Notes (Signed)
[facility] New Patient Evaluation and Consultation  Referring Provider: Agapito Games, * PCP: Agapito Games, MD Date of Service: 05/17/2023  SUBJECTIVE Chief Complaint: New Patient (Initial Visit) Anna Ramsey is a 57 y.o. female is here for urinary rentention.)  History of Present Illness: Anna Ramsey is a 57 y.o. White or Caucasian female seen in consultation at the request of Dr. Linford Arnold for evaluation of urinary retention.  She recently had an episode of rentention and was in the emergency room for this. She had a normal workup in the ER. 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 She is currently taking hyoscyamine prescribed by an [facility] provider.    Urinary Symptoms: Leaks urine with cough/ sneeze, laughing, exercise, lifting, going from sitting to standing, with a full bladder, with movement to the bathroom, with urgency, and without sensation Leaks 5-6 time(s) per days.  Pad use: Knix underwear.   She is bothered by her UI symptoms.  Day time voids 10.  Nocturia: 1 times per night to void. Voiding dysfunction: she empties her bladder well.  does not use a catheter to empty bladder.  When urinating, she feels dribbling after finishing and to push on her belly or vagina to empty bladder Drinks: 80-120 oz Water with Clear Mio per day  UTIs: 0 UTI's in the last year.   Denies history of blood in urine, kidney or bladder stones, pyelonephritis, bladder cancer, and kidney cancer  Pelvic Organ Prolapse Symptoms:                  She Denies a feeling of a bulge the vaginal area.   Bowel Symptom: Bowel movements: 1-2 time(s) per day Stool consistency: soft  Straining: yes.  Splinting: yes.  Incomplete evacuation: yes.  She Admits to  accidental bowel leakage / fecal incontinence  Occurs: 2 time(s) per month  Consistency with leakage: liquid Bowel regimen: diet, fiber, and stool softener, uses squatty potty Last colonoscopy: Date 2024, Results WNL  Sexual Function Sexually active: yes.  Sexual orientation: Straight Pain with sex: Yes, at the vaginal opening, has discomfort due to dryness  Pelvic Pain Denies pelvic pain    Past Medical History:  Past Medical History:  Diagnosis Date   ADHD (attention deficit hyperactivity disorder) 2024   Dysuria    Erythematous bladder mucosa    Heart murmur    asymptomatic per pt --  no echo   Hematuria    History of cervical cancer    12/ 2014  Stage IB2--  s/p  TAH w/ BSO and pelvic lymphadectomy/  and radiation therapy   History of radiation therapy 10/20/13-11/28/13   pelvis 50.4 gray   Hypertension    Lesion of bladder    Mild intermittent asthma    Neuromuscular disorder (HCC)    Seasonal allergies    Thyroid disease      Past Surgical History:   Past Surgical History:  Procedure Laterality Date   CYSTOSCOPY WITH BIOPSY N/A 04/29/2015   Procedure: CYSTOSCOPY WITH BIOPSY WITH FULGERATION;  Surgeon: Malen Gauze, MD;  Location: [facility];  Service: Urology;  Laterality: N/A;  1 HR [phone] [id number]   FOOT SURGERY Left 2010   RADICAL ABDOMINAL HYSTERECTOMY  09-05-2013   w/ BILATERAL SALPINGOOPHORECTOMY AND  PELVIC LYMPHADECTOMY     Past OB/GYN History: G1 P1 Vaginal deliveries: 1,  Forceps/ Vacuum deliveries: Forceps, Cesarean section: 0 Menopausal: Yes, at age 23 Last pap smear was 2023.  Any history of abnormal pap smears: yes.   Medications: She has a current medication list which includes the following prescription(s): albuterol, AMBULATORY NON FORMULARY MEDICATION, amlodipine, amphetamine-dextroamphetamine, budesonide-formoterol, bupropion, cetirizine, estradiol, fluticasone, hyoscyamine, levothyroxine, metoprolol  tartrate, paroxetine, pregabalin, trazodone, and vibegron.   Allergies: Patient is allergic to sulfa antibiotics, cefdinir, and penicillins.   Social History:  Social History   Tobacco Use   Smoking status: Former    Current packs/day: 0.00    Average packs/day: 0.5 packs/day for 25.0 years (12.5 ttl pk-yrs)    Types: E-cigarettes, Cigarettes    Start date: 09/26/1987    Quit date: 09/25/2012    Years since quitting: 10.6   Smokeless tobacco: Never   Tobacco comments:    has been using e-cig for past 2 yrs  Vaping Use   Vaping status: Every Day   Substances: THC  Substance Use Topics   Alcohol use: Yes    Alcohol/week: 1.0 standard drink of alcohol    Types: 1 Standard drinks or equivalent per week    Comment: occasional   Drug use: Yes    Types: Marijuana    Relationship status: married She lives with her husband.   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 Regular exercise: No History of abuse: No  Family History:   Family History  Problem Relation Age of Onset   Heart attack Father 56   Diabetes Father    Hypertension Maternal Grandmother    Stroke Maternal Grandmother    Cancer Mother        breast/thyroid     Review of Systems: Review of Systems  Constitutional:  Positive for malaise/fatigue and weight loss. Negative for chills and fever.  Respiratory:  Negative for cough and shortness of breath.   Cardiovascular:  Negative for chest pain and palpitations.  Gastrointestinal:  Negative for abdominal pain, blood in stool, constipation and diarrhea.  Skin:  Negative for rash.  Neurological:  Positive for dizziness and weakness. Negative for headaches.  Endo/Heme/Allergies:  Does not bruise/bleed easily.  Psychiatric/Behavioral:  Negative for depression and suicidal ideas. The patient is nervous/anxious.      OBJECTIVE Physical Exam: Vitals:   05/17/23 0840  BP: 118/76  Pulse: 61  Weight: 112 lb 9.6 oz (51.1 kg)  Height: 5\' 1"  (1.549 m)    Physical  Exam   GU / Detailed Urogynecologic Evaluation:  Pelvic Exam: Normal external female genitalia; Bartholin's and Skene's glands normal in appearance; urethral meatus normal in appearance, no urethral masses or discharge.   CST: negative  s/p hysterectomy: Speculum exam reveals normal vaginal mucosa with  atrophy and normal vaginal cuff.  Adnexa normal adnexa.    With apex supported, anterior compartment defect was reduced  Pelvic floor strength I/V  Pelvic floor musculature: Right levator non-tender, Right obturator tender, Left levator non-tender, Left obturator non-tender  POP-Q:   POP-Q  -3                                            Aa   -3  Ba  -6                                              C   2                                            Gh  2.5                                            Pb  6.5                                            tvl   -3                                            Ap  -3                                            Bp                                                 D      Rectal Exam:  Normal external exam  Post-Void Residual (PVR) by Bladder Scan: In order to evaluate bladder emptying, we discussed obtaining a postvoid residual and she agreed to this procedure.  Procedure: The ultrasound unit was placed on the patient's abdomen in the suprapubic region after the patient had voided. A PVR of 7 ml was obtained by bladder scan.  Laboratory Results: POC Urine: Negative for all signs of infection  ASSESSMENT AND PLAN Ms. Coxson is a 57 y.o. with:  1. OAB (overactive bladder)   2. Urge incontinence   3. Urinary frequency   4. Bladder spasms   5. Vaginal atrophy   6. SUI (stress urinary incontinence, female)   7. Levator spasm    We discussed the symptoms of overactive bladder (OAB), which include urinary urgency, urinary frequency, nocturia, with or without urge incontinence.  While  we do not know the exact etiology of OAB, several treatment options exist. We discussed management including behavioral therapy (decreasing bladder irritants, urge suppression strategies, timed voids, bladder retraining), physical therapy, medication; for refractory cases posterior tibial nerve stimulation, sacral neuromodulation, and intravesical botulinum toxin injection. For anticholinergic medications, we discussed the potential side effects of anticholinergics including dry eyes, dry mouth, constipation, cognitive impairment and urinary retention. For Beta-3 agonist medication, we discussed the potential side effect of elevated blood pressure which is more likely to occur in individuals with uncontrolled hypertension. Patient has tried Detrol which caused retention and she had to go to the emergency room for this. Due to the drying affects of anticholinergic  medications, would not suggest another anticholinergic.  As she has a history of bladder spasms and urge incontinence, would suggest a Beta 3 agonist medication. She is on Metoprolol, so is not a candidate for Myrbetriq. Will start patient on Gemtesa 75mg  daily.  Patient is interested in pelvic floor PT. She has done this previously but it has been quite a few years. Will send Referral for pelvic floor PT.  Patient has significant vaginal atrophy due to radiation from previous cervical cancer. Will start patient on vaginal estrogen. Patient to use a blueberry sized amount into the vagina. She may use this nightly for 2 weeks and then twice weekly after. We discussed using her finger instead of using the applicator.  Patient reports some stress incontinence. This does not seem as bothersome as her other symptoms. She may be a candidate for bulking, but this was not discussed today. Will start with pelvic floor PT and see if this is helpful for her symptoms. She is not a good surgical candidate due to her neurological problems.  Patient has significant  pain on palpation in her right obturator region. This could be related to her neurological deficits and difficulty coordinating her muscles on that side. Pelvic PT may be helpful and she is set to see someone at [facility] for her neuro deficit as well.   Patient to follow up in 6-8 weeks for medication follow up.     Selmer Dominion, NP

## 2023-05-18 NOTE — Progress Notes (Signed)
Contacted [email or web address] for Gemtesa 75 mg PA. Status: Pending Prior auth (EOC) ID: [id number]

## 2023-05-31 ENCOUNTER — Other Ambulatory Visit: Payer: Self-pay | Admitting: Family Medicine

## 2023-05-31 DIAGNOSIS — R41840 Attention and concentration deficit: Secondary | ICD-10-CM

## 2023-05-31 DIAGNOSIS — I10 Essential (primary) hypertension: Secondary | ICD-10-CM

## 2023-06-01 MED ORDER — BUPROPION HCL ER (XL) 150 MG PO TB24
150.0000 mg | ORAL_TABLET | Freq: Every morning | ORAL | 0 refills | Status: AC
Start: 2023-06-01 — End: ?

## 2023-06-01 MED ORDER — LEVOTHYROXINE SODIUM 88 MCG PO TABS: 88.0000 ug | ORAL_TABLET | Freq: Every day | ORAL | 0 refills | Status: DC

## 2023-06-01 MED ORDER — METOPROLOL TARTRATE 100 MG PO TABS
100.0000 mg | ORAL_TABLET | Freq: Two times a day (BID) | ORAL | 0 refills | Status: AC
Start: 2023-06-01 — End: ?

## 2023-06-19 ENCOUNTER — Other Ambulatory Visit: Payer: Self-pay | Admitting: Sports Medicine

## 2023-06-19 DIAGNOSIS — I10 Essential (primary) hypertension: Secondary | ICD-10-CM

## 2023-06-22 ENCOUNTER — Telehealth (INDEPENDENT_AMBULATORY_CARE_PROVIDER_SITE_OTHER): Payer: 59 | Admitting: Family Medicine

## 2023-06-22 ENCOUNTER — Encounter: Payer: Self-pay | Admitting: Family Medicine

## 2023-06-22 VITALS — BP 118/76 | HR 78 | Ht 61.0 in | Wt 114.0 lb

## 2023-06-22 DIAGNOSIS — I10 Essential (primary) hypertension: Secondary | ICD-10-CM

## 2023-06-22 DIAGNOSIS — G5793 Unspecified mononeuropathy of bilateral lower limbs: Secondary | ICD-10-CM | POA: Diagnosis not present

## 2023-06-22 DIAGNOSIS — J4520 Mild intermittent asthma, uncomplicated: Secondary | ICD-10-CM

## 2023-06-22 DIAGNOSIS — F411 Generalized anxiety disorder: Secondary | ICD-10-CM | POA: Diagnosis not present

## 2023-06-22 DIAGNOSIS — R41840 Attention and concentration deficit: Secondary | ICD-10-CM

## 2023-06-22 MED ORDER — BUDESONIDE-FORMOTEROL FUMARATE 80-4.5 MCG/ACT IN AERO: 2.0000 | INHALATION_SPRAY | Freq: Two times a day (BID) | RESPIRATORY_TRACT | 5 refills | Status: DC

## 2023-06-22 MED ORDER — BUPROPION HCL ER (XL) 150 MG PO TB24
150.0000 mg | ORAL_TABLET | Freq: Every morning | ORAL | 3 refills | Status: DC
Start: 2023-06-22 — End: 2024-04-21

## 2023-06-22 MED ORDER — PREGABALIN 100 MG PO CAPS: 100.0000 mg | ORAL_CAPSULE | Freq: Two times a day (BID) | ORAL | 1 refills | Status: DC

## 2023-06-22 MED ORDER — AMLODIPINE BESYLATE 5 MG PO TABS
5.0000 mg | ORAL_TABLET | Freq: Every day | ORAL | 3 refills | Status: DC
Start: 2023-06-22 — End: 2024-04-21

## 2023-06-22 NOTE — Assessment & Plan Note (Signed)
Anna Ramsey feels that her neuropathy is most consistent with a side effect of radiation.  She does take the pregabalin once a day in the mornings to help her function and it helps with a lot of stiffness and soreness.  Once in a while she will take it twice a day.

## 2023-06-22 NOTE — Progress Notes (Signed)
Established Patient Office Visit  Subjective   Patient ID: Anna Ramsey, female    DOB: 16-Aug-1966  Age: 57 y.o. MRN: [id number]  Chief Complaint  Patient presents with   Hypertension   Asthma    HPI  Hypertension- Pt denies chest pain, SOB, dizziness, or heart palpitations.  Taking meds as directed w/o problems.  Denies medication side effects.    F/U Asthma - ON Symbicort - one puff daily and prn albuterol   She was seen at [facility] in September and they did extensive blood work muscle and nerve conduction testing they feel like her neuropathy is most related to radiation damage.  They have put her on a B12 supplement that her levels were technically normal.  A1c look great at 5.5.   Inattention -she felt like the Adderall was making her very irritable so was recently switched to methylphenidate 27 mg and has been tolerating that really well so far.  She is really only been on the new dose for maybe a week.  Hypothyroidism-she feels like her thyroid is well-regulated.  No concerns.  I did look to see if you could run a TSH they did not so her last 1 was 0.7 back in March.    ROS    Objective:     BP 118/76   Pulse 78   Ht 5\' 1"  (1.549 m)   Wt 114 lb (51.7 kg)   LMP 08/19/2013   BMI 21.54 kg/m    Physical Exam Vitals reviewed.  Constitutional:      Appearance: Normal appearance.  HENT:     Head: Normocephalic.  Pulmonary:     Effort: Pulmonary effort is normal.  Neurological:     Mental Status: She is alert and oriented to person, place, and time.  Psychiatric:        Mood and Affect: Mood normal.        Behavior: Behavior normal.      No results found for any visits on 06/22/23.    The 10-year ASCVD risk score (Arnett DK, et al., 2019) is: 2%    Assessment & Plan:   Problem List Items Addressed This Visit       Cardiovascular and Mediastinum   HYPERTENSION, BENIGN - Primary    Well controlled. Continue current regimen. Follow up in  6  mo       Relevant Medications   amLODipine (NORVASC) 5 MG tablet     Respiratory   Asthma, mild intermittent   Relevant Medications   budesonide-formoterol (SYMBICORT) 80-4.5 MCG/ACT inhaler     Nervous and Auditory   Neuropathy involving both lower extremities    [facility] feels that her neuropathy is most consistent with a side effect of radiation.  She does take the pregabalin once a day in the mornings to help her function and it helps with a lot of stiffness and soreness.  Once in a while she will take it twice a day.      Relevant Medications   methylphenidate 27 MG PO CR tablet   pregabalin (LYRICA) 100 MG capsule   buPROPion (WELLBUTRIN XL) 150 MG 24 hr tablet     Other   Inattention   Relevant Medications   buPROPion (WELLBUTRIN XL) 150 MG 24 hr tablet   Generalized anxiety disorder    Doing well on current regimen of bupropion, Paxil, and trazodone for sleep.  Says she plans to stay on Paxil the rest of her life and does not want to  come off.      Relevant Medications   buPROPion (WELLBUTRIN XL) 150 MG 24 hr tablet    No follow-ups on file.   I spent 35 minutes on the day of the encounter to include pre-visit record review, face-to-face time with the patient and post visit ordering of test.   Nani Gasser, MD

## 2023-06-22 NOTE — Assessment & Plan Note (Signed)
Well controlled. Continue current regimen. Follow up in  6 mo  

## 2023-06-22 NOTE — Assessment & Plan Note (Signed)
Doing well on current regimen of bupropion, Paxil, and trazodone for sleep.  Says she plans to stay on Paxil the rest of her life and does not want to come off.

## 2023-06-23 ENCOUNTER — Encounter: Payer: Self-pay | Admitting: Family Medicine

## 2023-06-26 ENCOUNTER — Encounter: Payer: Self-pay | Admitting: Obstetrics and Gynecology

## 2023-06-26 ENCOUNTER — Other Ambulatory Visit: Payer: Self-pay | Admitting: Family Medicine

## 2023-06-26 ENCOUNTER — Telehealth: Payer: 59 | Admitting: Obstetrics and Gynecology

## 2023-06-26 DIAGNOSIS — I10 Essential (primary) hypertension: Secondary | ICD-10-CM

## 2023-06-26 DIAGNOSIS — N952 Postmenopausal atrophic vaginitis: Secondary | ICD-10-CM

## 2023-06-26 DIAGNOSIS — N3941 Urge incontinence: Secondary | ICD-10-CM

## 2023-06-26 DIAGNOSIS — N3281 Overactive bladder: Secondary | ICD-10-CM | POA: Diagnosis not present

## 2023-06-26 NOTE — Progress Notes (Signed)
[facility] Return Visit  SUBJECTIVE  History of Present Illness:  Virtual visit preformed as a Telehealth Video Visit Virtual visit completed with patient at her residence and NP in office  Anna Ramsey is a 57 y.o. female seen in follow-up for OAB and urinary retention. Plan at last visit was start Gemtesa 75mg  daily.  Saw [facility] neurology. Did nerve conduction testing and they believe her neurological defect is related to her pelvic floor radiation from cervical cancer.   She is doing very well on Gemtesa and is not having loss of urine with change in position any longer. She is able to make it to the bathroom in the mornings as well without loss of urine.   She is using the estrogen cream x2 weekly and she feels better overall.   Past Medical History: Patient  has a past medical history of ADHD (attention deficit hyperactivity disorder) (2024), Dysuria, Erythematous bladder mucosa, Heart murmur, Hematuria, History of cervical cancer, History of radiation therapy (10/20/13-11/28/13), Hypertension, Lesion of bladder, Mild intermittent asthma, Neuromuscular disorder (HCC), Seasonal allergies, and Thyroid disease.   Past Surgical History: She  has a past surgical history that includes Foot surgery (Left, 2010); Radical abdominal hysterectomy (09-05-2013); and Cystoscopy with biopsy (N/A, 04/29/2015).   Medications: She has a current medication list which includes the following prescription(s): albuterol, AMBULATORY NON FORMULARY MEDICATION, amlodipine, budesonide-formoterol, bupropion, cetirizine, estradiol, fluticasone, hyoscyamine, levothyroxine, methylphenidate, metoprolol tartrate, paroxetine, pregabalin, trazodone, and vibegron.   Allergies: Patient is allergic to sulfa antibiotics, cefdinir, and penicillins.   Social History: Patient  reports that she quit smoking about 10 years ago. Her smoking use included e-cigarettes and cigarettes. She started smoking about  35 years ago. She has a 12.5 pack-year smoking history. She has never used smokeless tobacco. She reports current alcohol use of about 1.0 standard drink of alcohol per week. She reports current drug use. Drug: Marijuana.      OBJECTIVE    Virtual visit: Patient appears well nourished, alert and oriented with no visible sign of distress.   ASSESSMENT AND PLAN    Anna Ramsey is a 57 y.o. with:  1. OAB (overactive bladder)   2. Urge incontinence   3. Vaginal atrophy    Patient plans to continue on Gemtesa at this time as it has resolved her overactive bladder spasms and loss of urine.  She is very happy with the results of the [name] as it has provided an increased quality of life for her regarding the issue of overactive bladder. Patient reports she has not had a noticeable loss of urine since starting the [name] we will plan to continue patient on this.  She has not been able to start pelvic floor PT at this time. For patient's vaginal atrophy she has been using estrogen cream twice weekly and reports that it feels much more comfortable in the vaginal area.  She does have a significant amount of scar tissue from previous exam due to having radiation of the pelvic floor related to cervical cancer.  Will plan to continue her on vaginal estrogen cream.   Patient to return in 6 months for medication follow-up or sooner if needed.

## 2023-06-29 ENCOUNTER — Ambulatory Visit: Payer: 59 | Admitting: Physical Therapy

## 2023-07-05 ENCOUNTER — Other Ambulatory Visit: Payer: Self-pay | Admitting: Family Medicine

## 2023-07-05 DIAGNOSIS — F5101 Primary insomnia: Secondary | ICD-10-CM

## 2023-07-12 ENCOUNTER — Other Ambulatory Visit: Payer: Self-pay | Admitting: Family Medicine

## 2023-07-12 MED ORDER — LEVOTHYROXINE SODIUM 88 MCG PO TABS: 88.0000 ug | ORAL_TABLET | Freq: Every day | ORAL | 0 refills | Status: DC

## 2023-08-03 NOTE — Therapy (Signed)
OUTPATIENT PHYSICAL THERAPY FEMALE PELVIC EVALUATION   Patient Name: Anna Ramsey MRN: [id number] DOB:06/05/1966, 57 y.o., female Today's Date: 08/03/2023  END OF SESSION:   Past Medical History:  Diagnosis Date   ADHD (attention deficit hyperactivity disorder) 2024   Dysuria    Erythematous bladder mucosa    Heart murmur    asymptomatic per pt --  no echo   Hematuria    History of cervical cancer    12/ 2014  Stage IB2--  s/p  TAH w/ BSO and pelvic lymphadectomy/  and radiation therapy   History of radiation therapy 10/20/13-11/28/13   pelvis 50.4 Anna Ramsey   Hypertension    Lesion of bladder    Mild intermittent asthma    Neuromuscular disorder (HCC)    Seasonal allergies    Thyroid disease    Past Surgical History:  Procedure Laterality Date   CYSTOSCOPY WITH BIOPSY N/A 04/29/2015   Procedure: CYSTOSCOPY WITH BIOPSY WITH FULGERATION;  Surgeon: Malen Gauze, MD;  Location: [facility];  Service: Urology;  Laterality: N/A;  1 HR [phone] [id number]   FOOT SURGERY Left 2010   RADICAL ABDOMINAL HYSTERECTOMY  09-05-2013   w/ BILATERAL SALPINGOOPHORECTOMY AND PELVIC LYMPHADECTOMY   Patient Active Problem List   Diagnosis Date Noted   Preventive measure 12/20/2022   Stress and adjustment reaction 01/13/2021   Inattention 10/28/2019   Hypothyroid 11/13/2016   Neuropathy involving both lower extremities 11/13/2016   Cystitis, radiation 05/22/2014   Cervical cancer (HCC) 09/01/2013   Adenocarcinoma of cervix, 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  Rationale for Evaluation and Treatment: Rehabilitation  ONSET DATE: ***  SUBJECTIVE:                                                                                                                                                                                           SUBJECTIVE STATEMENT: *** Fluid intake: {Yes/No:304960894}   PAIN:  Are you having pain? {yes/no:20286} NPRS scale: ***/10 Pain location: {pelvic pain location:27098}  Pain type: {type:313116} Pain description: {PAIN DESCRIPTION:21022940}   Aggravating factors: *** Relieving factors: ***  PRECAUTIONS: {Therapy precautions:24002}  RED FLAGS: {PT Red Flags:29287}   WEIGHT BEARING RESTRICTIONS: {Yes ***/  No:24003}  FALLS:  Has patient fallen in last 6 months? {fallsyesno:27318}  LIVING ENVIRONMENT: Lives with: {[facility] lives with:25569::"lives with their family"} Lives in: {Lives in:25570} Stairs: {opstairs:27293} Has following equipment at home: {Assistive devices:23999}  OCCUPATION: ***  PLOF: {PLOF:24004}  PATIENT GOALS: ***  PERTINENT HISTORY:  *** Sexual abuse: {Yes/No:304960894}  BOWEL MOVEMENT: Pain with bowel movement: {yes/no:20286} Type of bowel movement:{PT BM type:27100} Fully empty rectum: {Yes/No:304960894} Leakage: {Yes/No:304960894} Pads: {Yes/No:304960894} Fiber supplement: {Yes/No:304960894}  URINATION: Pain with urination: {yes/no:20286} Fully empty bladder: {Yes/No:304960894} Stream: {PT urination:27102} Urgency: {Yes/No:304960894} Frequency: *** Leakage: {PT leakage:27103} Pads: {Yes/No:304960894}  INTERCOURSE: Pain with intercourse: {pain with intercourse PA:27099} Ability to have vaginal penetration:  {Yes/No:304960894} Climax: *** Marinoff Scale: ***/3  PREGNANCY: Vaginal deliveries *** Tearing {Yes***/No:304960894} C-section deliveries *** Currently pregnant {Yes***/No:304960894}  PROLAPSE: {PT  prolapse:27101}   OBJECTIVE:  Note: Objective measures were completed at Evaluation unless otherwise noted.  DIAGNOSTIC FINDINGS:  ***  PATIENT SURVEYS:  {rehab surveys:24030}  PFIQ-7 ***  COGNITION: Overall cognitive status: {cognition:24006}     SENSATION: Light touch: {intact/deficits:24005} Proprioception: {intact/deficits:24005}  MUSCLE LENGTH: Hamstrings: Right *** deg; Left *** deg Thomas test: Right *** deg; Left *** deg  LUMBAR SPECIAL TESTS:  {lumbar special test:25242}  FUNCTIONAL TESTS:  {Functional tests:24029}  GAIT: Distance walked: *** Assistive device utilized: {Assistive devices:23999} Level of assistance: {Levels of assistance:24026} Comments: ***  POSTURE: {posture:25561}  PELVIC ALIGNMENT:  LUMBARAROM/PROM:  A/PROM A/PROM  eval  Flexion   Extension   Right lateral flexion   Left lateral flexion   Right rotation   Left rotation    (Blank rows = not tested)  LOWER EXTREMITY ROM:  {AROM/PROM:27142} ROM Right eval Left eval  Hip flexion    Hip extension    Hip abduction    Hip adduction    Hip internal rotation    Hip external rotation    Knee flexion    Knee extension    Ankle dorsiflexion    Ankle plantarflexion    Ankle inversion    Ankle eversion     (Blank rows = not tested)  LOWER EXTREMITY MMT:  MMT Right eval Left eval  Hip flexion    Hip extension    Hip abduction    Hip adduction    Hip internal rotation    Hip external rotation    Knee flexion    Knee extension    Ankle dorsiflexion    Ankle plantarflexion    Ankle inversion    Ankle eversion     PALPATION:   General  ***                External Perineal Exam ***                             Internal Pelvic Floor ***  Patient confirms identification and approves PT to assess internal pelvic floor and treatment {yes/no:20286}  PELVIC MMT:   MMT eval  Vaginal   Internal Anal Sphincter   External Anal Sphincter   Puborectalis   Diastasis  Recti   (Blank rows = not tested)        TONE: ***  PROLAPSE: ***  TODAY'S TREATMENT:  DATE: ***  EVAL ***   PATIENT EDUCATION:  Education details: *** Person educated: {Person educated:25204} Education method: {Education Method:25205} Education comprehension: {Education Comprehension:25206}  HOME EXERCISE PROGRAM: ***  ASSESSMENT:  CLINICAL IMPRESSION: Patient is a *** y.o. *** who was seen today for physical therapy evaluation and treatment for ***.   OBJECTIVE IMPAIRMENTS: {opptimpairments:25111}.   ACTIVITY LIMITATIONS: {activitylimitations:27494}  PARTICIPATION LIMITATIONS: {participationrestrictions:25113}  PERSONAL FACTORS: {Personal factors:25162} are also affecting patient's functional outcome.   REHAB POTENTIAL: {rehabpotential:25112}  CLINICAL DECISION MAKING: {clinical decision making:25114}  EVALUATION COMPLEXITY: {Evaluation complexity:25115}   GOALS: Goals reviewed with patient? {yes/no:20286}  SHORT TERM GOALS: Target date: ***  *** Baseline: Goal status: INITIAL  2.  *** Baseline:  Goal status: INITIAL  3.  *** Baseline:  Goal status: INITIAL  4.  *** Baseline:  Goal status: INITIAL  5.  *** Baseline:  Goal status: INITIAL  6.  *** Baseline:  Goal status: INITIAL  LONG TERM GOALS: Target date: ***  *** Baseline:  Goal status: INITIAL  2.  *** Baseline:  Goal status: INITIAL  3.  *** Baseline:  Goal status: INITIAL  4.  *** Baseline:  Goal status: INITIAL  5.  *** Baseline:  Goal status: INITIAL  6.  *** Baseline:  Goal status: INITIAL  PLAN:  PT FREQUENCY: {rehab frequency:25116}  PT DURATION: {rehab duration:25117}  PLANNED INTERVENTIONS: {rehab planned interventions:25118::"97110-Therapeutic exercises","97530- Therapeutic [id number]- Neuromuscular  re-education","97535- Self JXBJ","47829- Manual therapy"}  PLAN FOR NEXT SESSION: ***   Kamron Portee, PT 08/03/2023, 7:58 AM

## 2023-08-06 ENCOUNTER — Encounter: Payer: Self-pay | Admitting: Physical Therapy

## 2023-08-06 ENCOUNTER — Other Ambulatory Visit: Payer: Self-pay

## 2023-08-06 ENCOUNTER — Ambulatory Visit: Payer: 59 | Attending: Obstetrics and Gynecology | Admitting: Physical Therapy

## 2023-08-06 DIAGNOSIS — N3281 Overactive bladder: Secondary | ICD-10-CM | POA: Insufficient documentation

## 2023-08-06 DIAGNOSIS — R102 Pelvic and perineal pain: Secondary | ICD-10-CM | POA: Diagnosis not present

## 2023-08-06 DIAGNOSIS — N3289 Other specified disorders of bladder: Secondary | ICD-10-CM | POA: Diagnosis present

## 2023-08-06 DIAGNOSIS — M6281 Muscle weakness (generalized): Secondary | ICD-10-CM | POA: Insufficient documentation

## 2023-08-06 DIAGNOSIS — N3946 Mixed incontinence: Secondary | ICD-10-CM | POA: Insufficient documentation

## 2023-08-06 DIAGNOSIS — N393 Stress incontinence (female) (male): Secondary | ICD-10-CM | POA: Diagnosis present

## 2023-08-06 NOTE — Patient Instructions (Signed)
Lubrication Used for intercourse to reduce friction Avoid ones that have glycerin, nonoxynol-9, petroleum, propylene glycol, chlorhexidine gluconate, warming gels, tingling gels, icing or cooling gel, scented Avoid parabens due to a preservative similar to female sex hormone May need to be reapplied once or several times during sexual activity Can be applied to both partners genitals prior to vaginal penetration to minimize friction or irritation Prevent irritation and mucosal tears that cause post coital pain and increased the risk of vaginal and urinary tract infections Oil-based lubricants cannot be used with condoms due to breaking them down.  Least likely to irritate vaginal tissue.  Plant based-lubes are safe Silicone-based lubrication are thicker and last long and used for post-menopausal women  Vaginal Lubricators Here is a list of some suggested lubricators you can use for intercourse. Use the most hypoallergenic product.  You can place on you or your partner.  Slippery Stuff ( water based) Sylk or Sliquid Natural H2O ( good  if frequent UTI's)- walmart, amazon Sliquid organics silk-(aloe and silicone based ) [employer] (www.blossom-organics.com)- (aloe based ) Coconut oil, olive oil -not good with condoms  PJur Woman Nude- (water based) amazon Uberlube- ( silicon) Amazon Aloe Vera- Sprouts has an organic one Yes lubricant- (water based and has plant oil based similar to silicone) [employer] Platinum-Silicone, Target, [facility]reens Olive and Bee intimate cream-  www.oliveandbee.com.au Pink - International [employer] Erosense Sync- walmart, amazon Coconu- coconu.com Desert [employer] Good Clean Love lubricants  Things to avoid in lubricants are glycerin, warming gels, tingling gels, icing or cooling  gels, and scented gels.  Also avoid Vaseline. KY jelly,  and Astroglide contain chlorhexidine which kills good bacteria(lactobacilli)  Things to avoid in the vaginal area Do not use  things to irritate the vulvar area No lotions- see below Soaps you  can use :Aveeno, Calendula, Good Clean Love cleanser if needed. Must be gentle No deodorants No douches Good to sleep without underwear to let the vaginal area to air out No scrubbing: spread the lips to let warm water rinse over labias and pat dry  Creams that can be used on the Vulva Area V [employer], walmart Vital V Wild Yam Salve Julva- [employer] Botanical Pro-Meno Wild Yam Cream Coconut oil, olive oil Cleo by [employer] labial moisturizer -Amazon,  Desert [place] Releveum ( lidocaine) or Desert Engineer, agricultural     .Moisturizers They are used in the vagina to hydrate the mucous membrane that make up the vaginal canal. Designed to keep a more normal acid balance (ph) Once placed in the vagina, it will last between two to three days.  Use 2-3 times per week at bedtime  Ingredients to avoid is glycerin and fragrance, can increase chance of infection Should not be used just before sex due to causing irritation Most are gels administered either in a tampon-shaped applicator or as a vaginal suppository. They are non-hormonal.   Types of Moisturizers(internal use)  Vitamin E vaginal suppositories- Whole foods, Amazon Moist Again Coconut oil- can break down condoms, any grocery store (prefer organic) Julva- (Do no use if taking  Tamoxifen) amazon Yes moisturizer- amazon NeuEve Silk , NeuEve Silver for menopausal or over 65 (if have severe vaginal atrophy or cancer treatments use NeuEve Silk for  1 month than move to [employer])- [employer], [place].com Olive and Bee intimate cream- www.oliveandbee.com.au Mae vaginal moisturizer- Amazon Aloe Good Clean Love Hyaluronic acid Hyalofemme Reveree hyaluronic acid inserts   Creams to use externally on the Vulva area [facility]  Harvest Releveum (good for for cancer patients that had radiation to the area)- [place] or [employer].https://garcia-[email or web address]/ Vulva Balm/  V-magic cream by medicine mama- amazon Julva-amazon Vital "V Wild Yam salve ( help moisturize and help with thinning vulvar area, does have Beeswax MoodMaid Botanical Pro-Meno Wild Yam Cream- Amazon Desert [place]vest Gele Cleo by Zane Herald labial moisturizer (Amazon),  Coconut or olive oil aloe Good Clean Love Enchanted Rose by intimate rose  Things to avoid in the vaginal area Do not use things to irritate the vulvar area No lotions just specialized creams for the vulva area- Neogyn, V-magic,  No soaps; can use Aveeno or Calendula cleanser, unscented Dove if needed. Must be gentle No deodorants No douches Good to sleep without underwear to let the vaginal area to air out No scrubbing: spread the lips to let warm water rinse over labias and pat dry   [facility] [address] [place], [place] [address] Phone # [phone] Fax [phone]

## 2023-08-13 ENCOUNTER — Ambulatory Visit: Payer: 59 | Admitting: Physical Therapy

## 2023-08-13 ENCOUNTER — Encounter: Payer: Self-pay | Admitting: Physical Therapy

## 2023-08-13 DIAGNOSIS — N3281 Overactive bladder: Secondary | ICD-10-CM | POA: Diagnosis not present

## 2023-08-13 DIAGNOSIS — M6281 Muscle weakness (generalized): Secondary | ICD-10-CM

## 2023-08-13 DIAGNOSIS — R102 Pelvic and perineal pain: Secondary | ICD-10-CM

## 2023-08-13 NOTE — Therapy (Signed)
OUTPATIENT PHYSICAL THERAPY FEMALE PELVIC TREATMENT   Patient Name: Anna Ramsey MRN: [id number] DOB:1966-09-25, 57 y.o., female Today's Date: 08/13/2023  END OF SESSION:  PT End of Session - 08/13/23 0756     Visit Number 2    Date for PT Re-Evaluation 02/03/24    Authorization Type [employer]    PT Start Time 0800    PT Stop Time 0840    PT Time Calculation (min) 40 min    Activity Tolerance Patient tolerated treatment well    Behavior During Therapy [facility] for tasks assessed/performed             Past Medical History:  Diagnosis Date   ADHD (attention deficit hyperactivity disorder) 2024   Dysuria    Erythematous bladder mucosa    Heart murmur    asymptomatic per pt --  no echo   Hematuria    History of cervical cancer    12/ 2014  Stage IB2--  s/p  TAH w/ BSO and pelvic lymphadectomy/  and radiation therapy   History of radiation therapy 10/20/13-11/28/13   pelvis 50.4 Nereida Schepp   Hypertension    Lesion of bladder    Mild intermittent asthma    Neuromuscular disorder (HCC)    Seasonal allergies    Thyroid disease    Past Surgical History:  Procedure Laterality Date   CYSTOSCOPY WITH BIOPSY N/A 04/29/2015   Procedure: CYSTOSCOPY WITH BIOPSY WITH FULGERATION;  Surgeon: Malen Gauze, MD;  Location: [facility];  Service: Urology;  Laterality: N/A;  1 HR [id number]   FOOT SURGERY Left 2010   RADICAL ABDOMINAL HYSTERECTOMY  09-05-2013   w/ BILATERAL SALPINGOOPHORECTOMY AND PELVIC LYMPHADECTOMY   Patient Active Problem List   Diagnosis Date Noted   Preventive measure 12/20/2022   Stress and adjustment reaction 01/13/2021   Inattention 10/28/2019   Hypothyroid 11/13/2016   Neuropathy involving both lower extremities 11/13/2016   Cystitis, radiation 05/22/2014   Cervical cancer (HCC) 09/01/2013   Adenocarcinoma of cervix, 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   Asthma, mild intermittent 07/07/2009   INSOMNIA 05/28/2008   Generalized  anxiety disorder 05/07/2008   HYPERTENSION, BENIGN 05/31/2007    PCP: Merrilee Seashore, MD  REFERRING PROVIDER: Selmer Dominion, NP   REFERRING DIAG:  N32.81 (ICD-10-CM) - OAB (overactive bladder)  N32.89 (ICD-10-CM) - Bladder spasms  N39.3 (ICD-10-CM) - SUI (stress urinary incontinence, female)  N39.41 (ICD-10-CM) - Urge incontinence    THERAPY DIAG:  Muscle weakness (generalized)  Pelvic pain  Rationale for Evaluation and Treatment: Rehabilitation  ONSET DATE: 2015  SUBJECTIVE:  SUBJECTIVE STATEMENT: I have used my vaginal massager and I could tell the area was more relaxed.  Fluid intake: Yes: water    PAIN:  Are you having pain? Yes NPRS scale: 3/10 Pain location:  lower abdominal  Pain type: aching Pain description: constant   Aggravating factors: walking Relieving factors: try not to walk  PRECAUTIONS: Other: cervical cancer with radiation  RED FLAGS: None   WEIGHT BEARING RESTRICTIONS: No  FALLS:  Has patient fallen in last 6 months? Yes. 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; unable to sit with legs down due to them falling asleep  PLOF: Independent  PATIENT GOALS: to work on core and pelvic floor                                                                      PERTINENT HISTORY:  ADHD; Cervical Cancer 2014; 9 weeks of radiation; Radical hysterectomy;   BOWEL MOVEMENT: Pain with bowel movement: No Type of bowel movement:Type (Bristol Stool Scale) Type 4, Frequency daily, Strain No, and Splinting no  has to focus on the abdominal muscles to work a certain way for a  bowel movement Fully empty rectum: No, always feels like the rectal area is full Leakage: No Fiber supplement: No  URINATION: Pain with urination: No Fully empty bladder: Yes:   Stream: Weak Urgency: No Frequency: every 2-3 hours Leakage: Urge to void, Walking to the bathroom, Coughing, Sneezing, Laughing, Exercise, Lifting, and Bending forward leaks with all when she waits too long Pads: Yes: incontinence underwear  INTERCOURSE: Pain with intercourse:  only if she does it a certain way Ability to have vaginal penetration:  Yes:   Climax: yes Marinoff Scale: 0/3  PREGNANCY: Vaginal deliveries 1   OBJECTIVE:  Note: Objective measures were completed at Evaluation unless otherwise noted.   COGNITION: Overall cognitive status: Within functional limits for tasks assessed      GAIT: Assistive device utilized: Single point cane Level of assistance: Complete Independence Comments: uses a wheelchair in public  POSTURE: No Significant postural limitations  PELVIC ALIGNMENT:equal   PALPATION:                External Perineal Exam Intact                             Internal Pelvic Floor decreased mobility of the perineal body, tightness along the introitus, levator ani and obturator internist, vaginal canal is shortened; decreased mobility around the bladder  Patient confirms identification and approves PT to assess internal pelvic floor and treatment Yes  PELVIC MMT:   MMT eval  Vaginal 2/5  (Blank rows = not tested)        TONE: average  PROLAPSE: none  TODAY'S TREATMENT:    08/13/23 Manual: Soft tissue mobilization: Soft tissue work to the diaphragm, along the abdominals  Scar tissue mobilization: Manual work to the scar in lower abdomen and educated patient on how to perform at home Myofascial release: Tissue rolling of the abdomen and  educated patient Fascial release along the lower abdomen, and sides of the abdomen going through the restrictions and  hearing a lot of bowel sounds Exercises: Stretches/mobility: Educated patient on how to place the moisturizer along the vulva and urethra, then went over how to massage her estrogen cream into the vaginal canal Diaphragmatic breathing in supine to relax the pelvic floor   PATIENT EDUCATION: 08/13/23 Education details: Access Code: [email or web address], educated patient on moisturizers, educated patient on how to massage her abdomen Person educated: Patient Education method: Programmer, multimedia, Demonstration, Tactile cues, Verbal cues, and Handouts Education comprehension: verbalized understanding, returned demonstration, verbal cues required, tactile cues required, and needs further education    HOME EXERCISE PROGRAM: 08/13/23 Access Code: [email or web address] URL: [email or web address] Date: 08/13/2023 Prepared by: Eulis Foster  Program Notes massage the abdomen  Exercises - Supine Diaphragmatic Breathing  - 2 x daily - 7 x weekly - 1 sets - 10 reps    ASSESSMENT:  CLINICAL IMPRESSION: Patient is a 57 y.o. female who was seen today for physical therapy  treatment for Overactive bladder, stress incontinence,bladder spasms . Patient has good bowel sounds after the manual work. She is using the vaginal massager to improve the blood flow of the vulvar tissue. She understands how to massage her abdomen and scar. She has difficulty with diaphragmatic breathing.  Patient would benefit from skilled therapy to work on pelvic floor strength and coordination to reduce her leakage.   OBJECTIVE IMPAIRMENTS: decreased coordination, decreased endurance, decreased strength, increased fascial restrictions, increased muscle spasms, and pain.   ACTIVITY LIMITATIONS: continence and locomotion level  PARTICIPATION LIMITATIONS: community activity and occupation  PERSONAL FACTORS: Time since onset of injury/illness/exacerbation are also affecting patient's functional outcome.   REHAB POTENTIAL: Good  CLINICAL  DECISION MAKING: Evolving/moderate complexity  EVALUATION COMPLEXITY: Moderate   GOALS: Goals reviewed with patient? Yes  SHORT TERM GOALS: Target date: 09/02/23  Patient is independent with manual work to the pelvic floor to lengthen the tissue.  Baseline: Goal status: INITIAL  2.  Patient understands on vaginal moisturizers to improve vaginal health.  Baseline:  Goal status: INITIAL  3.  Patient is able to contract and fully lengthen her pelvic floor.  Baseline:  Goal status: INITIAL   LONG TERM GOALS: Target date: 02/03/24  Patient is independent with advanced HEP to increase pelvic floor and core strength.  Baseline:  Goal status: INITIAL  2.  Patient pelvic floor strength is >/= 3/5 so her urinary leakage decreased >/= 60%.  Baseline:  Goal status: INITIAL  3.  Patient is able to bend forward with minimal to no urinary leakage due to improved coordination with hip flexion.  Baseline:  Goal status: INITIAL  4.  Patient is able to use the urge to void behavioral technique to delay the urge to give her time to go to the bathroom.  Baseline:  Goal status: INITIAL   PLAN:  PT FREQUENCY: 1x/week  PT DURATION: 6 months  PLANNED INTERVENTIONS: 97110-Therapeutic exercises, 97530- Therapeutic activity, [id number]- Neuromuscular re-education, 97140- Manual therapy, 97014- Electrical stimulation (unattended), [id number]- Electrical stimulation (manual), Patient/Family education, Dry Needling, and Biofeedback  PLAN FOR NEXT SESSION: manual work to the pelvic floor, around the bladder, abdominal bracing,  pelvic floor contraction    Eulis Foster, PT 08/13/23 7:57 AM

## 2023-08-20 ENCOUNTER — Encounter: Payer: Self-pay | Admitting: Physical Therapy

## 2023-08-20 ENCOUNTER — Ambulatory Visit: Payer: 59 | Admitting: Physical Therapy

## 2023-08-20 DIAGNOSIS — N3281 Overactive bladder: Secondary | ICD-10-CM | POA: Diagnosis not present

## 2023-08-20 DIAGNOSIS — M6281 Muscle weakness (generalized): Secondary | ICD-10-CM

## 2023-08-20 DIAGNOSIS — R102 Pelvic and perineal pain: Secondary | ICD-10-CM

## 2023-08-20 NOTE — Therapy (Signed)
OUTPATIENT PHYSICAL THERAPY FEMALE PELVIC TREATMENT   Patient Name: Anna Ramsey MRN: [id number] DOB:October 11, 1965, 57 y.o., female Today's Date: 08/20/2023  END OF SESSION:  PT End of Session - 08/20/23 0801     Visit Number 3    Date for PT Re-Evaluation 02/03/24    Authorization Type [employer]    PT Start Time 0800    PT Stop Time 0840    PT Time Calculation (min) 40 min    Activity Tolerance Patient tolerated treatment well    Behavior During Therapy [facility] for tasks assessed/performed             Past Medical History:  Diagnosis Date   ADHD (attention deficit hyperactivity disorder) 2024   Dysuria    Erythematous bladder mucosa    Heart murmur    asymptomatic per pt --  no echo   Hematuria    History of cervical cancer    12/ 2014  Stage IB2--  s/p  TAH w/ BSO and pelvic lymphadectomy/  and radiation therapy   History of radiation therapy 10/20/13-11/28/13   pelvis 50.4 [facility] Rieth   Hypertension    Lesion of bladder    Mild intermittent asthma    Neuromuscular disorder (HCC)    Seasonal allergies    Thyroid disease    Past Surgical History:  Procedure Laterality Date   CYSTOSCOPY WITH BIOPSY N/A 04/29/2015   Procedure: CYSTOSCOPY WITH BIOPSY WITH FULGERATION;  Surgeon: Malen Gauze, MD;  Location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   Asthma, mild intermittent 07/07/2009   INSOMNIA 05/28/2008   Generalized  anxiety disorder 05/07/2008   HYPERTENSION, BENIGN 05/31/2007    PCP: Merrilee Seashore, MD  REFERRING PROVIDER: Selmer Dominion, NP   REFERRING DIAG:  N32.81 (ICD-10-CM) - OAB (overactive bladder)  N32.89 (ICD-10-CM) - Bladder spasms  N39.3 (ICD-10-CM) - SUI (stress urinary incontinence, female)  N39.41 (ICD-10-CM) - Urge incontinence    THERAPY DIAG:  Muscle weakness (generalized)  Pelvic pain  Rationale for Evaluation and Treatment: Rehabilitation  ONSET DATE: 2015  SUBJECTIVE:  SUBJECTIVE STATEMENT: I felt good after last visit. I do not have as much pain in the lower abdominal area. I have an easier time to have bowel movements. I still feel like there is more urine to void.  Fluid intake: Yes: water    PAIN:  Are you having pain? Yes NPRS scale: 3/10 Pain location:  lower abdominal  Pain type: aching Pain description: constant   Aggravating factors: walking Relieving factors: try not to walk  PRECAUTIONS: Other: cervical cancer with radiation  RED FLAGS: None   WEIGHT BEARING RESTRICTIONS: No  FALLS:  Has patient fallen in last 6 months? Yes. 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; unable to sit with legs down due to them falling asleep  PLOF: Independent  PATIENT GOALS: to work on core and pelvic floor                                                                      PERTINENT HISTORY:  ADHD; Cervical Cancer 2014; 9 weeks of radiation; Radical hysterectomy;   BOWEL MOVEMENT: Pain with bowel movement: No Type of bowel movement:Type (Bristol Stool Scale) Type 4,  Frequency daily, Strain No, and Splinting no  has to focus on the abdominal muscles to work a certain way for a bowel movement Fully empty rectum: No, always feels like the rectal area is full Leakage: No Fiber supplement: No  URINATION: Pain with urination: No Fully empty bladder: Yes:   Stream: Weak Urgency: No Frequency: every 2-3 hours Leakage: Urge to void, Walking to the bathroom, Coughing, Sneezing, Laughing, Exercise, Lifting, and Bending forward leaks with all when she waits too long Pads: Yes: incontinence underwear  INTERCOURSE: Pain with intercourse:  only if she does it a certain way Ability to have vaginal penetration:  Yes:   Climax: yes Marinoff Scale: 0/3  PREGNANCY: Vaginal deliveries 1   OBJECTIVE:  Note: Objective measures were completed at Evaluation unless otherwise noted.   COGNITION: Overall cognitive status: Within functional limits for tasks assessed      GAIT: Assistive device utilized: Single point cane Level of assistance: Complete Independence Comments: uses a wheelchair in public  POSTURE: No Significant postural limitations  PELVIC ALIGNMENT:equal   PALPATION:                External Perineal Exam Intact                             Internal Pelvic Floor decreased mobility of the perineal body, tightness along the introitus, levator ani and obturator internist, vaginal canal is shortened; decreased mobility around the bladder  Patient confirms identification and approves PT to assess internal pelvic floor and treatment Yes  PELVIC MMT:   MMT eval 08/20/23  Vaginal 2/5 2/5 with slight lift  (Blank rows = not tested)        TONE: average  PROLAPSE: none  TODAY'S TREATMENT:    08/20/23 Manual: Myofascial release: Fascial release of the urogenital diaphragm going through the restrictions Internal pelvic  floor techniques: No emotional/communication barriers or cognitive limitation. Patient is motivated to learn. Patient  understands and agrees with treatment goals and plan. PT explains patient will be examined in standing, sitting, and lying down to see how their muscles and joints work. When they are ready, they will be asked to remove their underwear so PT can examine their perineum. The patient is also given the option of providing their own chaperone as one is not provided in our facility. The patient also has the right and is explained the right to defer or refuse any part of the evaluation or treatment including the internal exam. With the patient's consent, PT will use one gloved finger to gently assess the muscles of the pelvic floor, seeing how well it contracts and relaxes and if there is muscle symmetry. After, the patient will get dressed and PT and patient will discuss exam findings and plan of care. PT and patient discuss plan of care, schedule, attendance policy and HEP activities.  Going through the vaginal canal working around the [employer], obturator internist, along the sides of the bladder and urethra, and around the bladder with one finger internal and other external in the lower abdomen.  Educated patient on how to use her vaginal massager to work internally on the muscles to elongate them.     08/13/23 Manual: Soft tissue mobilization: Soft tissue work to the diaphragm, along the abdominals  Scar tissue mobilization: Manual work to the scar in lower abdomen and educated patient on how to perform at home Myofascial release: Tissue rolling of the abdomen and educated patient Fascial release along the lower abdomen, and sides of the abdomen going through the restrictions and hearing a lot of bowel sounds Exercises: Stretches/mobility: Educated patient on how to place the moisturizer along the vulva and urethra, then went over how to massage her estrogen cream into the vaginal canal Diaphragmatic breathing in supine to relax the pelvic floor   PATIENT EDUCATION: 08/20/23 Education details:  Access Code: [email or web address], educated patient on moisturizers, educated patient on how to massage her abdomen, educated patient on how to massage the pelvic floor muscles with her vaginal Scientist, clinical (histocompatibility and immunogenetics) Person educated: Patient Education method: Programmer, multimedia, Demonstration, Tactile cues, Verbal cues, and Handouts Education comprehension: verbalized understanding, returned demonstration, verbal cues required, tactile cues required, and needs further education    HOME EXERCISE PROGRAM: 08/13/23 Access Code: [email or web address] URL: [email or web address] Date: 08/13/2023 Prepared by: Eulis Foster  Program Notes massage the abdomen  Exercises - Supine Diaphragmatic Breathing  - 2 x daily - 7 x weekly - 1 sets - 10 reps    ASSESSMENT:  CLINICAL IMPRESSION: Patient is a 57 y.o. female who was seen today for physical therapy  treatment for Overactive bladder, stress incontinence,bladder spasms . Pelvic floor strength is 2/5 with slight lift. She has restrictions throughout the pelvis but the tissue responds well to the manual work. She is able to have a bowel movement with greater ease.  Patient would benefit from skilled therapy to work on pelvic floor strength and coordination to reduce her leakage.   OBJECTIVE IMPAIRMENTS: decreased coordination, decreased endurance, decreased strength, increased fascial restrictions, increased muscle spasms, and pain.   ACTIVITY LIMITATIONS: continence and locomotion level  PARTICIPATION LIMITATIONS: community activity and occupation  PERSONAL FACTORS: Time since onset of injury/illness/exacerbation are also affecting patient's functional outcome.   REHAB POTENTIAL: Good  CLINICAL DECISION MAKING: Evolving/moderate complexity  EVALUATION COMPLEXITY: Moderate   GOALS: Goals reviewed with patient? Yes  SHORT TERM GOALS:  Target date: 09/02/23  Patient is independent with manual work to the pelvic floor to lengthen the tissue.  Baseline: Goal status: Met  08/20/23  2.  Patient understands on vaginal moisturizers to improve vaginal health.  Baseline:  Goal status: Met 08/20/23  3.  Patient is able to contract and fully lengthen her pelvic floor.  Baseline:  Goal status: INITIAL   LONG TERM GOALS: Target date: 02/03/24  Patient is independent with advanced HEP to increase pelvic floor and core strength.  Baseline:  Goal status: INITIAL  2.  Patient pelvic floor strength is >/= 3/5 so her urinary leakage decreased >/= 60%.  Baseline:  Goal status: INITIAL  3.  Patient is able to bend forward with minimal to no urinary leakage due to improved coordination with hip flexion.  Baseline:  Goal status: INITIAL  4.  Patient is able to use the urge to void behavioral technique to delay the urge to give her time to go to the bathroom.  Baseline:  Goal status: INITIAL   PLAN:  PT FREQUENCY: 1x/week  PT DURATION: 6 months  PLANNED INTERVENTIONS: 97110-Therapeutic exercises, 97530- Therapeutic activity, [id number]- Neuromuscular re-education, 97140- Manual therapy, 97014- Electrical stimulation (unattended), [id number]- Electrical stimulation (manual), Patient/Family education, Dry Needling, and Biofeedback  PLAN FOR NEXT SESSION: manual work to the pelvic floor, around the bladder, abdominal bracing,  pelvic floor contraction    Eulis Foster, PT 08/20/23 8:55 AM

## 2023-08-31 ENCOUNTER — Encounter: Payer: Self-pay | Admitting: Physical Therapy

## 2023-08-31 ENCOUNTER — Ambulatory Visit: Payer: 59 | Attending: Obstetrics and Gynecology | Admitting: Physical Therapy

## 2023-08-31 DIAGNOSIS — R102 Pelvic and perineal pain: Secondary | ICD-10-CM | POA: Diagnosis present

## 2023-08-31 DIAGNOSIS — M6281 Muscle weakness (generalized): Secondary | ICD-10-CM | POA: Insufficient documentation

## 2023-08-31 NOTE — Therapy (Signed)
OUTPATIENT PHYSICAL THERAPY FEMALE PELVIC TREATMENT   Patient Name: Anna Ramsey MRN: [id number] DOB:1966/06/13, 57 y.o., female Today's Date: 08/31/2023  END OF SESSION:  PT End of Session - 08/31/23 0759     Visit Number 4    Date for PT Re-Evaluation 02/03/24    Authorization Type [employer]    PT Start Time 0800    PT Stop Time 0840    PT Time Calculation (min) 40 min    Activity Tolerance Patient tolerated treatment well    Behavior During Therapy [facility] for tasks assessed/performed             Past Medical History:  Diagnosis Date   ADHD (attention deficit hyperactivity disorder) 2024   Dysuria    Erythematous bladder mucosa    Heart murmur    asymptomatic per pt --  no echo   Hematuria    History of cervical cancer    12/ 2014  Stage IB2--  s/p  TAH w/ BSO and pelvic lymphadectomy/  and radiation therapy   History of radiation therapy 10/20/13-11/28/13   pelvis 50.4 [facility]   Hypertension    Lesion of bladder    Mild intermittent asthma    Neuromuscular disorder (HCC)    Seasonal allergies    Thyroid disease    Past Surgical History:  Procedure Laterality Date   CYSTOSCOPY WITH BIOPSY N/A 04/29/2015   Procedure: CYSTOSCOPY WITH BIOPSY WITH FULGERATION;  Surgeon: Malen Gauze, MD;  Location: [facility];  Service: Urology;  Laterality: N/A;  1 HR [phone] [id number]   FOOT SURGERY Left 2010   RADICAL ABDOMINAL HYSTERECTOMY  09-05-2013   w/ BILATERAL SALPINGOOPHORECTOMY AND PELVIC LYMPHADECTOMY   Patient Active Problem List   Diagnosis Date Noted   Preventive measure 12/20/2022   Stress and adjustment reaction 01/13/2021   Inattention 10/28/2019   Hypothyroid 11/13/2016   Neuropathy involving both lower extremities 11/13/2016   Cystitis, radiation 05/22/2014   Cervical cancer (HCC) 09/01/2013   Adenocarcinoma of cervix, 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   Asthma, mild intermittent 07/07/2009   INSOMNIA 05/28/2008   Generalized  anxiety disorder 05/07/2008   HYPERTENSION, BENIGN 05/31/2007    PCP: Merrilee Seashore, MD  REFERRING PROVIDER: Selmer Dominion, NP   REFERRING DIAG:  N32.81 (ICD-10-CM) - OAB (overactive bladder)  N32.89 (ICD-10-CM) - Bladder spasms  N39.3 (ICD-10-CM) - SUI (stress urinary incontinence, female)  N39.41 (ICD-10-CM) - Urge incontinence    THERAPY DIAG:  Muscle weakness (generalized)  Pelvic pain  Rationale for Evaluation and Treatment: Rehabilitation  ONSET DATE: 2015  SUBJECTIVE:  SUBJECTIVE STATEMENT: I had intimate times with my husband and it went very well. The vaginal tissue is feeling more relaxed. I did not have the soreness afterwards. The muscles did as long to relax. I cannot remember the last time I had to change my underwear and change them.   Fluid intake: Yes: water    PAIN:  Are you having pain? Yes NPRS scale: 3/10 Pain location:  lower abdominal  Pain type: aching Pain description: constant   Aggravating factors: walking Relieving factors: try not to walk  PRECAUTIONS: Other: cervical cancer with radiation  RED FLAGS: None   WEIGHT BEARING RESTRICTIONS: No  FALLS:  Has patient fallen in last 6 months? Yes. 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; unable to sit with legs down due to them falling asleep  PLOF: Independent  PATIENT GOALS: to work on core and pelvic floor                                                                      PERTINENT HISTORY:  ADHD; Cervical Cancer 2014; 9 weeks of radiation; Radical hysterectomy;   BOWEL MOVEMENT: Pain with bowel  movement: No Type of bowel movement:Type (Bristol Stool Scale) Type 4, Frequency daily, Strain No, and Splinting no  has to focus on the abdominal muscles to work a certain way for a bowel movement Fully empty rectum: No, always feels like the rectal area is full Leakage: No Fiber supplement: No  URINATION: Pain with urination: No Fully empty bladder: Yes:   Stream: Weak Urgency: No Frequency: every 2-3 hours Leakage: Urge to void, Walking to the bathroom, Coughing, Sneezing, Laughing, Exercise, Lifting, and Bending forward leaks with all when she waits too long Pads: Yes: incontinence underwear  INTERCOURSE: Pain with intercourse:  only if she does it a certain way Ability to have vaginal penetration:  Yes:   Climax: yes Marinoff Scale: 0/3  PREGNANCY: Vaginal deliveries 1   OBJECTIVE:  Note: Objective measures were completed at Evaluation unless otherwise noted.   COGNITION: Overall cognitive status: Within functional limits for tasks assessed      GAIT: Assistive device utilized: Single point cane Level of assistance: Complete Independence Comments: uses a wheelchair in public  POSTURE: No Significant postural limitations  PELVIC ALIGNMENT:equal   PALPATION:                External Perineal Exam Intact                             Internal Pelvic Floor decreased mobility of the perineal body, tightness along the introitus, levator ani and obturator internist, vaginal canal is shortened; decreased mobility around the bladder  Patient confirms identification and approves PT to assess internal pelvic floor and treatment Yes  PELVIC MMT:   MMT eval 08/20/23  Vaginal 2/5 2/5 with slight lift  (Blank rows = not tested)        TONE: average  PROLAPSE: none  TODAY'S TREATMENT:    08/31/23 Manual: Myofascial release:  Fascial release around the lower abdomen going through the restrictions and feeling the release of the  tissue Exercises: Strengthening: Diaphragmatic breathing with abdominal contraction in supine, contracting equally and not bulge the lower abdomen Supine press hands into the thighs and contract the abdomen    08/20/23 Manual: Myofascial release: Fascial release of the urogenital diaphragm going through the restrictions Internal pelvic floor techniques: No emotional/communication barriers or cognitive limitation. Patient is motivated to learn. Patient understands and agrees with treatment goals and plan. PT explains patient will be examined in standing, sitting, and lying down to see how their muscles and joints work. When they are ready, they will be asked to remove their underwear so PT can examine their perineum. The patient is also given the option of providing their own chaperone as one is not provided in our facility. The patient also has the right and is explained the right to defer or refuse any part of the evaluation or treatment including the internal exam. With the patient's consent, PT will use one gloved finger to gently assess the muscles of the pelvic floor, seeing how well it contracts and relaxes and if there is muscle symmetry. After, the patient will get dressed and PT and patient will discuss exam findings and plan of care. PT and patient discuss plan of care, schedule, attendance policy and HEP activities.  Going through the vaginal canal working around the [employer], obturator internist, along the sides of the bladder and urethra, and around the bladder with one finger internal and other external in the lower abdomen.  Educated patient on how to use her vaginal massager to work internally on the muscles to elongate them.     08/13/23 Manual: Soft tissue mobilization: Soft tissue work to the diaphragm, along the abdominals  Scar tissue mobilization: Manual work to the scar in lower abdomen and educated patient on how to perform at home Myofascial release: Tissue rolling  of the abdomen and educated patient Fascial release along the lower abdomen, and sides of the abdomen going through the restrictions and hearing a lot of bowel sounds Exercises: Stretches/mobility: Educated patient on how to place the moisturizer along the vulva and urethra, then went over how to massage her estrogen cream into the vaginal canal Diaphragmatic breathing in supine to relax the pelvic floor   PATIENT EDUCATION: 08/31/23 Education details: Access Code: [email or web address], educated patient on moisturizers, educated patient on how to massage her abdomen, educated patient on how to massage the pelvic floor muscles with her vaginal massager Person educated: Patient Education method: Explanation, Demonstration, Tactile cues, Verbal cues, and Handouts Education comprehension: verbalized understanding, returned demonstration, verbal cues required, tactile cues required, and needs further education    HOME EXERCISE PROGRAM: 08/31/23 Access Code: [email or web address] URL: [email or web address] Date: 08/31/2023 Prepared by: Eulis Foster  Exercises - Supine Diaphragmatic Breathing  - 2 x daily - 7 x weekly - 1 sets - 10 reps - Hooklying Transversus Abdominis Palpation  - 1 x daily - 7 x weekly - 1 sets - 10 reps - Seated Transversus Abdominis Bracing  - 1 x daily - 7 x weekly - 1 sets - 10 reps - Supine Transversus Abdominis Bracing - Hands on Thighs  - 1 x daily - 7 x weekly - 1 sets - 10 reps    ASSESSMENT:  CLINICAL IMPRESSION: Patient is a 57 y.o. female who was seen today for physical therapy  treatment for Overactive bladder, stress incontinence,bladder spasms . Patient was able to have penile  penetration vaginally with no discomfort afterwards and the tissue was relaxed. Patient has not had urinary leakage in several weeks. She is using her massage tools to keep the tissue relaxed.   Patient would benefit from skilled therapy to work on pelvic floor strength and coordination to reduce  her leakage.   OBJECTIVE IMPAIRMENTS: decreased coordination, decreased endurance, decreased strength, increased fascial restrictions, increased muscle spasms, and pain.   ACTIVITY LIMITATIONS: continence and locomotion level  PARTICIPATION LIMITATIONS: community activity and occupation  PERSONAL FACTORS: Time since onset of injury/illness/exacerbation are also affecting patient's functional outcome.   REHAB POTENTIAL: Good  CLINICAL DECISION MAKING: Evolving/moderate complexity  EVALUATION COMPLEXITY: Moderate   GOALS: Goals reviewed with patient? Yes  SHORT TERM GOALS: Target date: 09/02/23  Patient is independent with manual work to the pelvic floor to lengthen the tissue.  Baseline: Goal status: Met 08/20/23  2.  Patient understands on vaginal moisturizers to improve vaginal health.  Baseline:  Goal status: Met 08/20/23  3.  Patient is able to contract and fully lengthen her pelvic floor.  Baseline:  Goal status: INITIAL   LONG TERM GOALS: Target date: 02/03/24  Patient is independent with advanced HEP to increase pelvic floor and core strength.  Baseline:  Goal status: INITIAL  2.  Patient pelvic floor strength is >/= 3/5 so her urinary leakage decreased >/= 60%.  Baseline:  Goal status: INITIAL  3.  Patient is able to bend forward with minimal to no urinary leakage due to improved coordination with hip flexion.  Baseline:  Goal status: INITIAL  4.  Patient is able to use the urge to void behavioral technique to delay the urge to give her time to go to the bathroom.  Baseline:  Goal status: INITIAL   PLAN:  PT FREQUENCY: 1x/week  PT DURATION: 6 months  PLANNED INTERVENTIONS: 97110-Therapeutic exercises, 97530- Therapeutic activity, [id number]- Neuromuscular re-education, 97140- Manual therapy, 97014- Electrical stimulation (unattended), [id number]- Electrical stimulation (manual), Patient/Family education, Dry Needling, and Biofeedback  PLAN FOR NEXT SESSION:  manual work to the pelvic floor, around the bladder, abdominal bracing,  pelvic floor contraction    Eulis Foster, PT 08/31/23 8:45 AM

## 2023-09-17 ENCOUNTER — Encounter: Payer: Self-pay | Admitting: Physical Therapy

## 2023-09-18 ENCOUNTER — Ambulatory Visit: Payer: Self-pay | Admitting: Physical Therapy

## 2023-09-18 ENCOUNTER — Ambulatory Visit: Payer: 59 | Admitting: Physical Therapy

## 2023-09-18 ENCOUNTER — Encounter: Payer: Self-pay | Admitting: Physical Therapy

## 2023-09-18 DIAGNOSIS — M6281 Muscle weakness (generalized): Secondary | ICD-10-CM | POA: Diagnosis not present

## 2023-09-18 DIAGNOSIS — R102 Pelvic and perineal pain: Secondary | ICD-10-CM

## 2023-09-18 NOTE — Therapy (Signed)
OUTPATIENT PHYSICAL THERAPY FEMALE PELVIC TREATMENT   Patient Name: Anna Ramsey MRN: [id number] DOB:1966/03/28, 57 y.o., female Today's Date: 09/18/2023  END OF SESSION:  PT End of Session - 09/18/23 0807     Visit Number 5    Date for PT Re-Evaluation 02/03/24    Authorization Type [employer]    PT Start Time 0807    PT Stop Time 0845    PT Time Calculation (min) 38 min    Activity Tolerance Patient tolerated treatment well    Behavior During Therapy [facility] for tasks assessed/performed             Past Medical History:  Diagnosis Date   ADHD (attention deficit hyperactivity disorder) 2024   Dysuria    Erythematous bladder mucosa    Heart murmur    asymptomatic per pt --  no echo   Hematuria    History of cervical cancer    12/ 2014  Stage IB2--  s/p  TAH w/ BSO and pelvic lymphadectomy/  and radiation therapy   History of radiation therapy 10/20/13-11/28/13   pelvis 50.4 Anna Ramsey   Hypertension    Lesion of bladder    Mild intermittent asthma    Neuromuscular disorder (HCC)    Seasonal allergies    Thyroid disease    Past Surgical History:  Procedure Laterality Date   CYSTOSCOPY WITH BIOPSY N/A 04/29/2015   Procedure: CYSTOSCOPY WITH BIOPSY WITH FULGERATION;  Surgeon: Malen Gauze, MD;  Location: [facility];  Service: Urology;  Laterality: N/A;  1 HR [phone] [id number]   FOOT SURGERY Left 2010   RADICAL ABDOMINAL HYSTERECTOMY  09-05-2013   w/ BILATERAL SALPINGOOPHORECTOMY AND PELVIC LYMPHADECTOMY   Patient Active Problem List   Diagnosis Date Noted   Preventive measure 12/20/2022   Stress and adjustment reaction 01/13/2021   Inattention 10/28/2019   Hypothyroid 11/13/2016   Neuropathy involving both lower extremities 11/13/2016   Cystitis, radiation 05/22/2014   Cervical cancer (HCC) 09/01/2013   Adenocarcinoma of cervix, 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   Asthma, mild intermittent 07/07/2009   INSOMNIA 05/28/2008   Generalized  anxiety disorder 05/07/2008   HYPERTENSION, BENIGN 05/31/2007    PCP: Anna Seashore, MD  REFERRING PROVIDER: Selmer Dominion, NP   REFERRING DIAG:  N32.81 (ICD-10-CM) - OAB (overactive bladder)  N32.89 (ICD-10-CM) - Bladder spasms  N39.3 (ICD-10-CM) - SUI (stress urinary incontinence, female)  N39.41 (ICD-10-CM) - Urge incontinence    THERAPY DIAG:  Muscle weakness (generalized)  Pelvic pain  Rationale for Evaluation and Treatment: Rehabilitation  ONSET DATE: 2015  SUBJECTIVE:  SUBJECTIVE STATEMENT: I am not having bladder pain. I am not having leakage. I still do get up 1 time at night. I have soreness in the lower abdomen.  Fluid intake: Yes: water    PAIN:  Are you having pain? Yes NPRS scale: 0/10 Pain location:  lower abdominal  Pain type: aching Pain description: constant   Aggravating factors: walking Relieving factors: try not to walk  PRECAUTIONS: Other: cervical cancer with radiation  RED FLAGS: None   WEIGHT BEARING RESTRICTIONS: No  FALLS:  Has patient fallen in last 6 months? Yes. 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; unable to sit with legs down due to them falling asleep  PLOF: Independent  PATIENT GOALS: to work on core and pelvic floor                                                                      PERTINENT HISTORY:  ADHD; Cervical Cancer 2014; 9 weeks of radiation; Radical hysterectomy;   BOWEL MOVEMENT: Pain with bowel movement: No Type of bowel movement:Type (Bristol Stool Scale) Type 4, Frequency daily, Strain No, and Splinting no  has to focus  on the abdominal muscles to work a certain way for a bowel movement Fully empty rectum: No, always feels like the rectal area is full Leakage: No Fiber supplement: No  URINATION: Pain with urination: No Fully empty bladder: Yes:   Stream: Weak Urgency: No Frequency: every 2-3 hours Leakage: Urge to void, Walking to the bathroom, Coughing, Sneezing, Laughing, Exercise, Lifting, and Bending forward leaks with all when she waits too long Pads: Yes: incontinence underwear  INTERCOURSE: Pain with intercourse:  only if she does it a certain way Ability to have vaginal penetration:  Yes:   Climax: yes Marinoff Scale: 0/3  PREGNANCY: Vaginal deliveries 1   OBJECTIVE:  Note: Objective measures were completed at Evaluation unless otherwise noted.   COGNITION: Overall cognitive status: Within functional limits for tasks assessed      GAIT: Assistive device utilized: Single point cane Level of assistance: Complete Independence Comments: uses a wheelchair in public  POSTURE: No Significant postural limitations  PELVIC ALIGNMENT:equal   PALPATION:                External Perineal Exam Intact                             Internal Pelvic Floor decreased mobility of the perineal body, tightness along the introitus, levator ani and obturator internist, vaginal canal is shortened; decreased mobility around the bladder  Patient confirms identification and approves PT to assess internal pelvic floor and treatment Yes  PELVIC MMT:   MMT eval 08/20/23  Vaginal 2/5 2/5 with slight lift  (Blank rows = not tested)        TONE: average  PROLAPSE: none  TODAY'S TREATMENT:    09/18/23 Manual: Soft tissue mobilization: Soft tissue work to the diaphragm, along the abdominals  Scar tissue mobilization: Manual work to the scar in lower abdomen  Tissue rolling of scar to release of the tissue Myofascial release: Fascial release around the lower abdomen going through the  restrictions and feeling the release of the tissue Exercises: Strengthening: Supine transverse abdominus engagement Supine ball squeeze with pulling yellow band across body with abdominal engagement 10 x each way    08/31/23 Manual: Myofascial release: Fascial release around the lower abdomen going through the restrictions and feeling the release of the tissue Exercises: Strengthening: Diaphragmatic breathing with abdominal contraction in supine, contracting equally and not bulge the lower abdomen Supine press hands into the thighs and contract the abdomen    08/20/23 Manual: Myofascial release: Fascial release of the urogenital diaphragm going through the restrictions Internal pelvic floor techniques: No emotional/communication barriers or cognitive limitation. Patient is motivated to learn. Patient understands and agrees with treatment goals and plan. PT explains patient will be examined in standing, sitting, and lying down to see how their muscles and joints work. When they are ready, they will be asked to remove their underwear so PT can examine their perineum. The patient is also given the option of providing their own chaperone as one is not provided in our facility. The patient also has the right and is explained the right to defer or refuse any part of the evaluation or treatment including the internal exam. With the patient's consent, PT will use one gloved finger to gently assess the muscles of the pelvic floor, seeing how well it contracts and relaxes and if there is muscle symmetry. After, the patient will get dressed and PT and patient will discuss exam findings and plan of care. PT and patient discuss plan of care, schedule, attendance policy and HEP activities.  Going through the vaginal canal working around the [employer], obturator internist, along the sides of the bladder and urethra, and around the bladder with one finger internal and other external in the lower abdomen.   Educated patient on how to use her vaginal massager to work internally on the muscles to elongate them.     08/13/23 Manual: Soft tissue mobilization: Soft tissue work to the diaphragm, along the abdominals  Scar tissue mobilization: Manual work to the scar in lower abdomen and educated patient on how to perform at home Myofascial release: Tissue rolling of the abdomen and educated patient Fascial release along the lower abdomen, and sides of the abdomen going through the restrictions and hearing a lot of bowel sounds Exercises: Stretches/mobility: Educated patient on how to place the moisturizer along the vulva and urethra, then went over how to massage her estrogen cream into the vaginal canal Diaphragmatic breathing in supine to relax the pelvic floor   PATIENT EDUCATION: 08/31/23 Education details: Access Code: [email or web address], educated patient on moisturizers, educated patient on how to massage her abdomen, educated patient on how to massage the pelvic floor muscles with her vaginal massager Person educated: Patient Education method: Explanation, Demonstration, Tactile cues, Verbal cues, and Handouts Education comprehension: verbalized understanding, returned demonstration, verbal cues required, tactile cues required, and needs further education    HOME EXERCISE PROGRAM: 08/31/23 Access Code: [email or web address] URL: [email or web address] Date: 08/31/2023 Prepared by: Eulis Foster  Exercises - Supine Diaphragmatic Breathing  - 2 x daily - 7 x weekly - 1 sets - 10 reps - Hooklying Transversus Abdominis Palpation  - 1 x daily - 7 x weekly - 1 sets - 10 reps - Seated Transversus Abdominis Bracing  - 1 x daily - 7 x weekly - 1 sets - 10 reps - Supine  Transversus Abdominis Bracing - Hands on Thighs  - 1 x daily - 7 x weekly - 1 sets - 10 reps    ASSESSMENT:  CLINICAL IMPRESSION: Patient is a 57 y.o. female who was seen today for physical therapy  treatment for Overactive  bladder, stress incontinence,bladder spasms . Patient is not having abdominal pain just soreness. She is able to engage her abdominals better by bringing her rib cage downward. She has difficulty with the ball squeeze due to weakness in the hip adductors. She has had a reduction of urine leakage.   Patient would benefit from skilled therapy to work on pelvic floor strength and coordination to reduce her leakage.   OBJECTIVE IMPAIRMENTS: decreased coordination, decreased endurance, decreased strength, increased fascial restrictions, increased muscle spasms, and pain.   ACTIVITY LIMITATIONS: continence and locomotion level  PARTICIPATION LIMITATIONS: community activity and occupation  PERSONAL FACTORS: Time since onset of injury/illness/exacerbation are also affecting patient's functional outcome.   REHAB POTENTIAL: Good  CLINICAL DECISION MAKING: Evolving/moderate complexity  EVALUATION COMPLEXITY: Moderate   GOALS: Goals reviewed with patient? Yes  SHORT TERM GOALS: Target date: 09/02/23  Patient is independent with manual work to the pelvic floor to lengthen the tissue.  Baseline: Goal status: Met 08/20/23  2.  Patient understands on vaginal moisturizers to improve vaginal health.  Baseline:  Goal status: Met 08/20/23  3.  Patient is able to contract and fully lengthen her pelvic floor.  Baseline:  Goal status: INITIAL   LONG TERM GOALS: Target date: 02/03/24  Patient is independent with advanced HEP to increase pelvic floor and core strength.  Baseline:  Goal status: INITIAL  2.  Patient pelvic floor strength is >/= 3/5 so her urinary leakage decreased >/= 60%.  Baseline:  Goal status: INITIAL  3.  Patient is able to bend forward with minimal to no urinary leakage due to improved coordination with hip flexion.  Baseline:  Goal status: INITIAL  4.  Patient is able to use the urge to void behavioral technique to delay the urge to give her time to go to the bathroom.   Baseline:  Goal status: INITIAL   PLAN:  PT FREQUENCY: 1x/week  PT DURATION: 6 months  PLANNED INTERVENTIONS: 97110-Therapeutic exercises, 97530- Therapeutic activity, [id number]- Neuromuscular re-education, 97140- Manual therapy, 97014- Electrical stimulation (unattended), [id number]- Electrical stimulation (manual), Patient/Family education, Dry Needling, and Biofeedback  PLAN FOR NEXT SESSION: manual work to the pelvic floor, around the bladder, abdominal bracing,  pelvic floor contraction    Eulis Foster, PT 09/18/23 8:47 AM

## 2023-09-23 ENCOUNTER — Encounter: Payer: Self-pay | Admitting: Physical Therapy

## 2023-09-24 ENCOUNTER — Ambulatory Visit: Payer: 59 | Admitting: Physical Therapy

## 2023-09-24 ENCOUNTER — Other Ambulatory Visit (HOSPITAL_COMMUNITY)
Admission: RE | Admit: 2023-09-24 | Discharge: 2023-09-24 | Disposition: A | Discharge status: Home / Self Care | Payer: 59 | Source: Other Acute Inpatient Hospital | Attending: Obstetrics and Gynecology | Admitting: Obstetrics and Gynecology

## 2023-09-24 ENCOUNTER — Ambulatory Visit (INDEPENDENT_AMBULATORY_CARE_PROVIDER_SITE_OTHER): Payer: 59

## 2023-09-24 ENCOUNTER — Encounter: Payer: Self-pay | Admitting: Physical Therapy

## 2023-09-24 VITALS — BP 151/90 | HR 66

## 2023-09-24 DIAGNOSIS — R350 Frequency of micturition: Secondary | ICD-10-CM | POA: Insufficient documentation

## 2023-09-24 DIAGNOSIS — M6281 Muscle weakness (generalized): Secondary | ICD-10-CM | POA: Diagnosis not present

## 2023-09-24 DIAGNOSIS — R319 Hematuria, unspecified: Secondary | ICD-10-CM

## 2023-09-24 DIAGNOSIS — R102 Pelvic and perineal pain: Secondary | ICD-10-CM

## 2023-09-24 LAB — POCT URINALYSIS DIPSTICK
Glucose, UA: NEGATIVE
Leukocytes, UA: NEGATIVE
Nitrite, UA: NEGATIVE
Protein, UA: POSITIVE — AB
Spec Grav, UA: >=1.03 — AB (ref 1.010–1.025)
Urobilinogen, UA: 1 U/dL
pH, UA: 6 (ref 5.0–8.0)

## 2023-09-24 MED ORDER — NITROFURANTOIN MONOHYD MACRO 100 MG PO CAPS: 100.0000 mg | ORAL_CAPSULE | Freq: Two times a day (BID) | ORAL | 0 refills | Status: DC

## 2023-09-24 NOTE — Patient Instructions (Signed)
Your Urine dip that was done in office was Negative. I am sending the urine off for culture and you can take AZO over the counter for your discomfort.  We have also ordered Macrobid for you to take while we wait for your culture results, hopefully this gives you some relief. We will contact you when the results are back between 3-5 days. If a different antibiotic is needed we will sent the order to the pharmacy and you will be notified. If you have any questions or concerns please feel free to call us at [phone]

## 2023-09-24 NOTE — Therapy (Signed)
OUTPATIENT PHYSICAL THERAPY FEMALE PELVIC TREATMENT   Patient Name: Anna Ramsey MRN: [id number] DOB:Apr 04, 1966, 57 y.o., female Today's Date: 09/24/2023  END OF SESSION:  PT End of Session - 09/24/23 0801     Visit Number 6    Date for PT Re-Evaluation 02/03/24    Authorization Type [employer]    PT Start Time 0800    PT Stop Time 0840    PT Time Calculation (min) 40 min    Activity Tolerance Patient tolerated treatment well    Behavior During Therapy [facility] for tasks assessed/performed             Past Medical History:  Diagnosis Date   ADHD (attention deficit hyperactivity disorder) 2024   Dysuria    Erythematous bladder mucosa    Heart murmur    asymptomatic per pt --  no echo   Hematuria    History of cervical cancer    12/ 2014  Stage IB2--  s/p  TAH w/ BSO and pelvic lymphadectomy/  and radiation therapy   History of radiation therapy 10/20/13-11/28/13   pelvis 50.4 Peng Thorstenson   Hypertension    Lesion of bladder    Mild intermittent asthma    Neuromuscular disorder (HCC)    Seasonal allergies    Thyroid disease    Past Surgical History:  Procedure Laterality Date   CYSTOSCOPY WITH BIOPSY N/A 04/29/2015   Procedure: CYSTOSCOPY WITH BIOPSY WITH FULGERATION;  Surgeon: Malen Gauze, MD;  Location: [facility];  Service: Urology;  Laterality: N/A;  1 HR [phone] [id number]   FOOT SURGERY Left 2010   RADICAL ABDOMINAL HYSTERECTOMY  09-05-2013   w/ BILATERAL SALPINGOOPHORECTOMY AND PELVIC LYMPHADECTOMY   Patient Active Problem List   Diagnosis Date Noted   Preventive measure 12/20/2022   Stress and adjustment reaction 01/13/2021   Inattention 10/28/2019   Hypothyroid 11/13/2016   Neuropathy involving both lower extremities 11/13/2016   Cystitis, radiation 05/22/2014   Cervical cancer (HCC) 09/01/2013   Adenocarcinoma of cervix, 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   Asthma, mild intermittent 07/07/2009   INSOMNIA 05/28/2008   Generalized  anxiety disorder 05/07/2008   HYPERTENSION, BENIGN 05/31/2007    PCP: Merrilee Seashore, MD  REFERRING PROVIDER: Selmer Dominion, NP   REFERRING DIAG:  N32.81 (ICD-10-CM) - OAB (overactive bladder)  N32.89 (ICD-10-CM) - Bladder spasms  N39.3 (ICD-10-CM) - SUI (stress urinary incontinence, female)  N39.41 (ICD-10-CM) - Urge incontinence    THERAPY DIAG:  Muscle weakness (generalized)  Pelvic pain  Rationale for Evaluation and Treatment: Rehabilitation  ONSET DATE: 2015  SUBJECTIVE:  SUBJECTIVE STATEMENT: I woke up to go to the bathroom. I started to have pain and not able to urinate. The ER said she she had Peritoneal ascites, constipation, cholelithiasis. I have a catheter right now and waiting to see my doctor.  Fluid intake: Yes: water    PAIN:  Are you having pain? Yes NPRS scale: 3/10 Pain location:  lower abdominal  Pain type: aching Pain description: constant   Aggravating factors: walking Relieving factors: try not to walk  PRECAUTIONS: Other: cervical cancer with radiation  RED FLAGS: None   WEIGHT BEARING RESTRICTIONS: No  FALLS:  Has patient fallen in last 6 months? Yes. 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; unable to sit with legs down due to them falling asleep  PLOF: Independent  PATIENT GOALS: to work on core and pelvic floor                                                                      PERTINENT HISTORY:  ADHD; Cervical Cancer 2014; 9 weeks of radiation; Radical hysterectomy;   BOWEL MOVEMENT: Pain with bowel movement: No Type of bowel movement:Type  (Bristol Stool Scale) Type 4, Frequency daily, Strain No, and Splinting no  has to focus on the abdominal muscles to work a certain way for a bowel movement Fully empty rectum: No, always feels like the rectal area is full Leakage: No Fiber supplement: No  URINATION: Pain with urination: No Fully empty bladder: Yes:   Stream: Weak Urgency: No Frequency: every 2-3 hours Leakage: Urge to void, Walking to the bathroom, Coughing, Sneezing, Laughing, Exercise, Lifting, and Bending forward leaks with all when she waits too long Pads: Yes: incontinence underwear  INTERCOURSE: Pain with intercourse:  only if she does it a certain way Ability to have vaginal penetration:  Yes:   Climax: yes Marinoff Scale: 0/3  PREGNANCY: Vaginal deliveries 1   OBJECTIVE:  Note: Objective measures were completed at Evaluation unless otherwise noted.   COGNITION: Overall cognitive status: Within functional limits for tasks assessed      GAIT: Assistive device utilized: Single point cane Level of assistance: Complete Independence Comments: uses a wheelchair in public  POSTURE: No Significant postural limitations  PELVIC ALIGNMENT:equal   PALPATION:                External Perineal Exam Intact                             Internal Pelvic Floor decreased mobility of the perineal body, tightness along the introitus, levator ani and obturator internist, vaginal canal is shortened; decreased mobility around the bladder  Patient confirms identification and approves PT to assess internal pelvic floor and treatment Yes  PELVIC MMT:   MMT eval 08/20/23  Vaginal 2/5 2/5 with slight lift  (Blank rows = not tested)        TONE: average  PROLAPSE: none  TODAY'S TREATMENT:    09/24/23 Manual: Soft tissue mobilization: Manual work to the lower abdomen.  Manual  work to the diaphragm Myofascial release: Fascial release to the lower abdomen, around the lateral sides of the abdomen, around the  bladder, and the midline of the abdomen to release the restricted tissue and reduce the pain.   09/18/23 Manual: Soft tissue mobilization: Soft tissue work to the diaphragm, along the abdominals  Scar tissue mobilization: Manual work to the scar in lower abdomen Tissue rolling of scar to release of the tissue Myofascial release: Fascial release around the lower abdomen going through the restrictions and feeling the release of the tissue Exercises: Strengthening: Supine transverse abdominus engagement Supine ball squeeze with pulling yellow band across body with abdominal engagement 10 x each way    08/31/23 Manual: Myofascial release: Fascial release around the lower abdomen going through the restrictions and feeling the release of the tissue Exercises: Strengthening: Diaphragmatic breathing with abdominal contraction in supine, contracting equally and not bulge the lower abdomen Supine press hands into the thighs and contract the abdomen        PATIENT EDUCATION: 08/31/23 Education details: Access Code: [email or web address], educated patient on moisturizers, educated patient on how to massage her abdomen, educated patient on how to massage the pelvic floor muscles with her vaginal massager Person educated: Patient Education method: Explanation, Demonstration, Tactile cues, Verbal cues, and Handouts Education comprehension: verbalized understanding, returned demonstration, verbal cues required, tactile cues required, and needs further education    HOME EXERCISE PROGRAM: 08/31/23 Access Code: [email or web address] URL: [email or web address] Date: 08/31/2023 Prepared by: Eulis Foster  Exercises - Supine Diaphragmatic Breathing  - 2 x daily - 7 x weekly - 1 sets - 10 reps - Hooklying Transversus Abdominis Palpation  - 1 x daily - 7 x weekly - 1 sets - 10 reps - Seated Transversus Abdominis Bracing  - 1 x daily - 7 x weekly - 1 sets - 10 reps - Supine Transversus Abdominis Bracing -  Hands on Thighs  - 1 x daily - 7 x weekly - 1 sets - 10 reps    ASSESSMENT:  CLINICAL IMPRESSION: Patient is a 57 y.o. female who was seen today for physical therapy  treatment for Overactive bladder, stress incontinence,bladder spasms . Patient has had increased abdominal pain yesterday and went to the ER. She presently has a catheter. ER said she she had Peritoneal ascites, constipation, cholelithiasis. Her urine was red color when she came and was not this way yesterday. She is waiting to see the doctor. Her abdominal pain decreased to 1/10 after the manual work.    Patient would benefit from skilled therapy to work on pelvic floor strength and coordination to reduce her leakage.   OBJECTIVE IMPAIRMENTS: decreased coordination, decreased endurance, decreased strength, increased fascial restrictions, increased muscle spasms, and pain.   ACTIVITY LIMITATIONS: continence and locomotion level  PARTICIPATION LIMITATIONS: community activity and occupation  PERSONAL FACTORS: Time since onset of injury/illness/exacerbation are also affecting patient's functional outcome.   REHAB POTENTIAL: Good  CLINICAL DECISION MAKING: Evolving/moderate complexity  EVALUATION COMPLEXITY: Moderate   GOALS: Goals reviewed with patient? Yes  SHORT TERM GOALS: Target date: 09/02/23  Patient is independent with manual work to the pelvic floor to lengthen the tissue.  Baseline: Goal status: Met 08/20/23  2.  Patient understands on vaginal moisturizers to improve vaginal health.  Baseline:  Goal status: Met 08/20/23  3.  Patient is able to contract and fully lengthen her pelvic floor.  Baseline:  Goal status: INITIAL   LONG TERM GOALS: Target date: 02/03/24  Patient is independent with advanced HEP  to increase pelvic floor and core strength.  Baseline:  Goal status: INITIAL  2.  Patient pelvic floor strength is >/= 3/5 so her urinary leakage decreased >/= 60%.  Baseline:  Goal status:  INITIAL  3.  Patient is able to bend forward with minimal to no urinary leakage due to improved coordination with hip flexion.  Baseline:  Goal status: INITIAL  4.  Patient is able to use the urge to void behavioral technique to delay the urge to give her time to go to the bathroom.  Baseline:  Goal status: INITIAL   PLAN:  PT FREQUENCY: 1x/week  PT DURATION: 6 months  PLANNED INTERVENTIONS: 97110-Therapeutic exercises, 97530- Therapeutic activity, [id number]- Neuromuscular re-education, 97140- Manual therapy, 97014- Electrical stimulation (unattended), [phone]- Electrical stimulation (manual), Patient/Family education, Dry Needling, and Biofeedback  PLAN FOR NEXT SESSION: manual work to the pelvic floor, around the bladder, abdominal bracing,  pelvic floor contraction, see what MD said    Eulis Foster, PT 09/24/23 8:47 AM

## 2023-09-24 NOTE — Progress Notes (Signed)
 Virtual Visit via Video Note  Consent was obtained for video visit:  Yes.   Answered questions that patient had about telehealth interaction:  Yes.   I discussed the limitations, risks, security and privacy concerns of performing an evaluation and management service by telemedicine. I also discussed with the patient that there may be a patient responsible charge related to this service. The patient expressed understanding and agreed to proceed.  Pt location: Home Physician Location: office Name of referring provider:  Alvan Dorothyann BIRCH, * I connected with Anna Ramsey at patients initiation/request on 09/25/2023 at  9:30 AM EST by video enabled telemedicine application and verified that I am speaking with the correct person using two identifiers. Pt MRN:  [id number] Pt DOB:  [birth year] Video Participants:  Anna Ramsey   Assessment/Plan:   Radiation-induced lumbosacral plexopathy Urinary retention - possibly has some degree of neurogenic bladder but mostly these events were due to episodes of severe pelvic pain    If she should have another episode of severe pelvic pain, advised to take one of her pregablin.  Will also prescribe baclofen  10mg  to take if needed. Home exercises as taught previously by physical therapy Follow up 6 months.  Total time spent in chart and face to face with patient:  45 minutes    Subjective:  Anna BIRCH. Bonenfant is a 57 year old female with HTN, thyroid disease, and history of cervical cancer who who follows up for progressive polyradiculoneuropathy.   UPDATE: By August, she was experiencing urinary retention.  Seen in the ED.  CT A/P with contrast revealed mild to moderate volume ascites and mild diffuse urinary bladder wall thickening as well as colonic diverticulosis.  Prescribed Cipro  for presumed cystitis.  She was seen in the [facility] in September.  Questioned 2 separate processes:  Carpal tunnel  syndrome in the upper the extremities and a radiation induced lumbosacral plexopathy.  Underwent further testing.  Labs included Hgb A1c 5.5, B12 318, MMA 0.18, B6 31.1, copper 101, elevated vit E 26.8, SPEP/IFE negative paraprotein/monoclonal component, kappa/gamma light chains normal, total protein 6.7, ANA negative, borderline elevated RF 14, ESR 1, CRP 0.11 and CCP <0.5.  NCV-EMG on 06/11/2023 confirmed bilateral lumbosacral plexopathies (right worse than left) with myokymia supporting diagnosis of radiation-induced lumbosacral plexopathy.  Abnormalities in the right FDI and APB thought to be a chronic C8-T1 radiculopathy on the right without active denervation.  No definitive electrodiagnostic evidence for a large fiber polyneuropathy or multilevel bilateral lumbosacral radiculopathies.  In August, she had an episode of severe pelvic pain that caused her to not be able to urinate.  She had an MRI of the C-spine without contrast on 07/28/2023 which showed right central C5-C6 disc protrusion indenting the ventral spinal cord but no compression or definite neuroforaminal stenosis.  MRI of pelvis with and without contrast showed enlargement and increased signal within the lumbosacral nerve roots with scattered muscular denervation changes, right worse than left, correlating with lumbar plexopathy.  She most recently had another episode of severe pelvic pain a couple of days ago.  She is wearing a catheter for the next week.  She followed up with her urogynecologist.  Other than these episodes of severe pain, she denies bowel or bladder dysfunction.  She walks in the house holding onto the walls or furniture.  Outside of the house she uses a cane for short distances.  If she has to be out for an extended period, such as shopping, she  will use her motorized wheelchair.     HISTORY: Patient has history of cervical cancer status post hysterectomy and pelvic radiation in 2015.  Around 2018, she sprained her right  ankle.  She began noticing tingling and numbness on the bottom of her right foot that spread up her leg and then involved her left leg.  The numbness typically radiated up to her knees but sometimes it extends to the waist.  Her legs feel like cement and struggles to lift her legs.  Sometimes her right leg may give out.  History states chronic low back pain with radiculopathy but she denies back pain or radicular pain in the legs.  She has undergone epidural injections in the low back which has not helped the numbness.  She denies neuropathic pain such as burning, stabbing or shocks .  She also had some cognitive changes, finding it more difficulty to multitask.  She was found to have hypothyroidism which has since been treated.  Other labs checked included B12 540, folate 6.2, B1 8, ferritin 128, Mg 2.1 and mildly elevated B6 of 29.2.  MRI of lumbar spine on 08/01/2017 showed levoscoliosis without any significant disc herniation, spinal or neuroforaminal stenosis.  She had another MRI of lumbar spine on 01/05/2020 which was also unremarkable and negative for any explanation.  MRI of cervical and thoracic spine in August 2021 showed right sided foraminal stenosis at C6-7 but otherwise unremarkable.  She started experiencing numbness and tingling in the mouth  MRI of brain without contrast on 01/05/2020 was normal.  She has been evaluated by neurology in [facility].  She underwent a NCV-EMG that reportedly showed mild to moderate peripheral neuropathy.  I saw patient in 2022.  Repeat NCV-EMG of lower extremities showed no evidence of polyneuropathy but did demonstrate findings suggestive of right worse than left L3-4 radiculopathy.  As imaging of the lumbar spine did not reveal any structural cause, she underwent LP for CSF analysis of an inflammatory etiology such as CIDP, but protein was normal.  CSF cell count 1, protein 31, glucose 52, gram stain with rare WBC but no organisms, cytology with no malignant cells,  Lyme IgG/IgM negative, ACE 2, 3 oligoclonal bands (also detected in serum), IgG index 0.55.  She had a repeat NCV-EMG on 02/09/2023 which showed interval worsening of sensory responses and significant progression of neurogenic changes in the lower extremities when compared to prior study from 12/29/2020, consistent with an active on chronic polyradiculoneuropathy.  She had a repeat LP on 6/3 which again was unremarkable with normal protein (cell count 2, protein 36, glucose 58, negative cytology, negative gram stain and culture, negative VDRL, ACE 8, negative oligoclonal bands, negative Lyme PCR.  Serum sensory (+/- motor) neuropathy panel from [employer]  [employer] was negative (including anti-GD1a, anti-GD1b, anti-GM1, anti-MAG).    No family history of progressive neuropathy.  Past Medical History: Past Medical History:  Diagnosis Date   ADHD (attention deficit hyperactivity disorder) 2024   Dysuria    Erythematous bladder mucosa    Heart murmur    asymptomatic per pt --  no echo   Hematuria    History of cervical cancer    12/ 2014  Stage IB2--  s/p  TAH w/ BSO and pelvic lymphadectomy/  and radiation therapy   History of radiation therapy 10/20/13-11/28/13   pelvis 50.4 gray   Hypertension    Lesion of bladder    Mild intermittent asthma    Neuromuscular disorder (HCC)    Seasonal allergies  Thyroid disease     Medications: Current Outpatient Medications on File Prior to Visit  Medication Sig Dispense Refill   albuterol  (VENTOLIN  HFA) 108 (90 Base) MCG/ACT inhaler Inhale 1-2 puffs into the lungs every 6 (six) hours as needed for wheezing or shortness of breath. 36 g 2   AMBULATORY NON FORMULARY MEDICATION Medication Name: R custom ankle foot orthoses. DX: M62.81,M21.371 [employer] fax: [phone] 1 each 0   amLODipine  (NORVASC ) 5 MG tablet Take 1 tablet (5 mg total) by mouth daily. 90 tablet 3   budesonide -formoterol  (SYMBICORT ) 80-4.5 MCG/ACT inhaler Inhale 2 puffs into the lungs 2  (two) times daily. 1 each 5   buPROPion  (WELLBUTRIN  XL) 150 MG 24 hr tablet Take 1 tablet (150 mg total) by mouth every morning. Needs appointment. 90 tablet 3   cetirizine (ZYRTEC) 10 MG tablet Take 10 mg by mouth at bedtime.     estradiol  (ESTRACE ) 0.1 MG/GM vaginal cream Place 0.5 g vaginally 2 (two) times a week. Place 0.5g nightly for two weeks then twice a week after 42.5 g 11   fluticasone  (FLONASE ) 50 MCG/ACT nasal spray Place 2 sprays into both nostrils daily as needed.      levothyroxine  (SYNTHROID ) 88 MCG tablet Take 1 tablet (88 mcg total) by mouth daily. 90 tablet 0   methylphenidate 27 MG PO CR tablet Take 27 mg by mouth daily.     metoprolol  tartrate (LOPRESSOR ) 100 MG tablet Take 1 tablet by mouth twice daily 180 tablet 0   nitrofurantoin , macrocrystal-monohydrate, (MACROBID ) 100 MG capsule Take 1 capsule (100 mg total) by mouth 2 (two) times daily. 10 capsule 0   PARoxetine  (PAXIL ) 30 MG tablet Take 1 tablet (30 mg total) by mouth daily. 90 tablet 1   pregabalin  (LYRICA ) 100 MG capsule Take 1 capsule (100 mg total) by mouth 2 (two) times daily. 180 capsule 1   traZODone  (DESYREL ) 50 MG tablet TAKE 1 TABLET BY MOUTH AT BEDTIME 90 tablet 0   Vibegron  75 MG TABS Take 1 tablet (75 mg total) by mouth daily. 30 tablet 5   No current facility-administered medications on file prior to visit.     Allergies: Allergies  Allergen Reactions   Sulfa Antibiotics Other (See Comments)    Numbness in lower extremities from knees down   Cefdinir    Penicillins Other (See Comments)    Unknown childhood reaction    Family History: Family History  Problem Relation Age of Onset   Heart attack Father 70   Diabetes Father    Hypertension Maternal Grandmother    Stroke Maternal Grandmother    Cancer Mother        breast/thyroid    Observations/Objective:   No acute distress.  Alert and oriented.  Speech fluent and not dysarthric.  Language intact.   Follow Up Instructions:    -I  discussed the assessment and treatment plan with the patient. The patient was provided an opportunity to ask questions and all were answered. The patient agreed with the plan and demonstrated an understanding of the instructions.   The patient was advised to call back or seek an in-person evaluation if the symptoms worsen or if the condition fails to improve as anticipated.   Juliene Lamar Dunnings, DO

## 2023-09-24 NOTE — Progress Notes (Signed)
Anna Ramsey is a 57 y.o. female arrived today with UTI sx.  A urine specimen was collected and POCT Urine was done. Urine was sent for culture POCT Urine was Negative

## 2023-09-25 ENCOUNTER — Encounter: Payer: Self-pay | Admitting: Neurology

## 2023-09-25 ENCOUNTER — Telehealth (INDEPENDENT_AMBULATORY_CARE_PROVIDER_SITE_OTHER): Payer: 59 | Admitting: Neurology

## 2023-09-25 DIAGNOSIS — G541 Lumbosacral plexus disorders: Secondary | ICD-10-CM | POA: Diagnosis not present

## 2023-09-25 LAB — URINE CULTURE: Culture: NO GROWTH

## 2023-09-25 MED ORDER — BACLOFEN 10 MG PO TABS: 10.0000 mg | ORAL_TABLET | Freq: Three times a day (TID) | ORAL | 0 refills | Status: AC | PRN

## 2023-09-25 NOTE — Patient Instructions (Signed)
If you get the pelvic pain, take a pregablin.  May take a baclofen if needed

## 2023-09-27 ENCOUNTER — Encounter: Payer: Self-pay | Admitting: Obstetrics and Gynecology

## 2023-09-28 ENCOUNTER — Encounter: Payer: Self-pay | Admitting: Obstetrics and Gynecology

## 2023-09-28 ENCOUNTER — Ambulatory Visit (INDEPENDENT_AMBULATORY_CARE_PROVIDER_SITE_OTHER): Payer: 59 | Admitting: Obstetrics and Gynecology

## 2023-09-28 ENCOUNTER — Encounter: Payer: Self-pay | Admitting: Family Medicine

## 2023-09-28 VITALS — BP 128/85 | HR 74

## 2023-09-28 DIAGNOSIS — R338 Other retention of urine: Secondary | ICD-10-CM | POA: Diagnosis not present

## 2023-09-28 NOTE — Progress Notes (Signed)
 [facility] Return Visit  SUBJECTIVE  History of Present Illness: Anna Ramsey is a 58 y.o. female seen in follow-up for difficulty emptying her bladder and catheter placement in ER.   Patient was seen in the emergency room on 09/23/2023 and diagnosed with urinary retention.  She she had blood work done which indicated a mildly increased creatinine clearance but showed no sign of a urinary tract infection or other systemic processes.  Patient had a Foley catheter placed at that time and has had the Foley since then.  Patient reports she has discussed with her neurologist and they are concerned that there is a neurological component to this when it occurs.  Patient's catheter bag has dark urine with obvious blood noted.  The only recent medication changes have been an increase to her ADHD medicine which she is now taking twice a day.    Past Medical History: Patient  has a past medical history of ADHD (attention deficit hyperactivity disorder) (2024), Dysuria, Erythematous bladder mucosa, Heart murmur, Hematuria, History of cervical cancer, History of radiation therapy (10/20/13-11/28/13), Hypertension, Lesion of bladder, Mild intermittent asthma, Neuromuscular disorder (HCC), Seasonal allergies, and Thyroid disease.   Past Surgical History: She  has a past surgical history that includes Foot surgery (Left, 2010); Radical abdominal hysterectomy (09-05-2013); and Cystoscopy with biopsy (N/A, 04/29/2015).   Medications: She has a current medication list which includes the following prescription(s): albuterol , AMBULATORY NON FORMULARY MEDICATION, amlodipine , baclofen , budesonide -formoterol , bupropion , cetirizine, estradiol , fluticasone , levothyroxine , methylphenidate, metoprolol  tartrate, nitrofurantoin  (macrocrystal-monohydrate), paroxetine , pregabalin , trazodone , and vibegron .   Allergies: Patient is allergic to sulfa antibiotics, cefdinir, and penicillins.   Social  History: Patient  reports that she quit smoking about 11 years ago. Her smoking use included e-cigarettes and cigarettes. She started smoking about 36 years ago. She has a 12.5 pack-year smoking history. She has never used smokeless tobacco. She reports current alcohol use of about 1.0 standard drink of alcohol per week. She reports current drug use. Drug: Marijuana.     OBJECTIVE   MRI Pelvis 07/28/23  mpression: 1.  Enlargement and increased signal within the lumbosacral nerve roots with scattered muscular denervation changes, right worse than left. Findings suggest lumbar plexopathy due to prior injury/insult. No discrete mass or mass effect. The nerves distal to the sciatic notch are normal in appearance. 2.  Abnormal signal within the left anterior sacral ala suspicious for a small insufficiency fracture, age indeterminate.  Electronically Signed by:  Rea Parent, MD, [facility] Electronically Signed on:  07/29/2023 9:58 AM Exam End: 07/28/23 17:03     Last Creatinine 1.08 on 09/23/23 Lipase 65   Physical Exam: Vitals:   09/28/23 0809  BP: 128/85  Pulse: 74   Gen: No apparent distress, A&O x 3.  Detailed Urogynecologic Evaluation:   250 mL instilled into the bladder via catheter before catheter was removed.  Catheter easily deflated and removed from patient's bladder/urethra.  Due to patient's ambulatory issues the bedside commode was placed in the room patient was able to walk to the bedside commode and sit and reports that she had no issues urinating.  Approximately 280 mL of urine was noted in the hat.   Minimal vaginal irritation related to her In-N-Out self cathing attempts today.  We did practice self-catheterization techniques utilizing a large standing near and also while sitting over the bedside commode.  Patient was able to successfully catheterize herself with me in the room.  We had multiple trials in which she had put the  catheter into the vagina and she  could tell the difference tween vaginal and urethral penetration of the catheter.  Patient was given self cath supplies to take all.   ASSESSMENT AND PLAN    Ms. Anna Ramsey is a 58 y.o. with:  1. Acute urinary retention    Patient presents today for voiding trial and was able to pass voiding trial.  We also did self cath teaching today and she was sent home with self cath supplies in case she were to need them.  She reports next time that she has significant pain and pelvic floor tension she will attempt to do a muscle relaxant and gabapentin  and see if she can calm things down because she does not want to return to the emergency room for another catheter and she reports more confidence now that she has her own self-catheterization supplies and feels as if she could do this if she needed to.  She is aware that if she does have to self catheterize I will require her to call the office and let us  know and that we would need to stop the Gemtesa  and if that were the case. Due to her having frank blood in the catheter bag along with her history of cancer I think it would be prudent to do a cystoscopy to rule out any new masses, polyps, or other concerning bladder diagnoses.  She does have a history of tobacco use.  Patient reports it has been years since she has had a cystoscopy.  Recent CT scan did not show any obvious issues with the bladder as it showed decompression to the catheter.  Patient also had an MRI done on November 2 which showed enlargement and increased signal within the left lumbosacral nerve roots with scattered denervation changes.  There could be a nerve component to this as well and her neurologist is aware.  Unsure if they would be on the suggestion to do sacral neuromodulation if this is the case, would defer this to a surgeon's expertise.  Will plan for patient to return for cystoscopy.  Patient to return for cysto  Mckinnley Smithey G Betzy Barbier, NP

## 2023-10-02 ENCOUNTER — Other Ambulatory Visit: Payer: Self-pay | Admitting: Family Medicine

## 2023-10-02 DIAGNOSIS — F5101 Primary insomnia: Secondary | ICD-10-CM

## 2023-10-05 ENCOUNTER — Encounter: Payer: Self-pay | Admitting: Physical Therapy

## 2023-10-05 ENCOUNTER — Ambulatory Visit: Payer: 59 | Attending: Obstetrics and Gynecology | Admitting: Physical Therapy

## 2023-10-05 DIAGNOSIS — M6281 Muscle weakness (generalized): Secondary | ICD-10-CM | POA: Insufficient documentation

## 2023-10-05 DIAGNOSIS — R102 Pelvic and perineal pain: Secondary | ICD-10-CM | POA: Insufficient documentation

## 2023-10-05 NOTE — Therapy (Signed)
 OUTPATIENT PHYSICAL THERAPY FEMALE PELVIC TREATMENT   Patient Name: Anna Ramsey MRN: [id number] DOB:[birth year], 58 y.o., female Today's Date: 10/05/2023  END OF SESSION:  PT End of Session - 10/05/23 0934     Visit Number 7    Date for PT Re-Evaluation 02/03/24    Authorization Type [employer]    PT Start Time 0930    PT Stop Time 1010    PT Time Calculation (min) 40 min    Activity Tolerance Patient tolerated treatment well    Behavior During Therapy [facility] for tasks assessed/performed             Past Medical History:  Diagnosis Date   ADHD (attention deficit hyperactivity disorder) 2024   Dysuria    Erythematous bladder mucosa    Heart murmur    asymptomatic per pt --  no echo   Hematuria    History of cervical cancer    12/ 2014  Stage IB2--  s/p  TAH w/ BSO and pelvic lymphadectomy/  and radiation therapy   History of radiation therapy 10/20/13-11/28/13   pelvis 50.4 Nancyjo Givhan   Hypertension    Lesion of bladder    Mild intermittent asthma    Neuromuscular disorder (HCC)    Seasonal allergies    Thyroid disease    Past Surgical History:  Procedure Laterality Date   CYSTOSCOPY WITH BIOPSY N/A 04/29/2015   Procedure: CYSTOSCOPY WITH BIOPSY WITH FULGERATION;  Surgeon: Belvie LITTIE Clara, MD;  Location: [facility];  Service: Urology;  Laterality: N/A;  1 HR [phone] [id number]   FOOT SURGERY Left 2010   RADICAL ABDOMINAL HYSTERECTOMY  09-05-2013   w/ BILATERAL SALPINGOOPHORECTOMY AND PELVIC LYMPHADECTOMY   Patient Active Problem List   Diagnosis Date Noted   Preventive measure 12/20/2022   Stress and adjustment reaction 01/13/2021   Inattention 10/28/2019   Hypothyroid 11/13/2016   Neuropathy involving both lower extremities 11/13/2016   Cystitis, radiation 05/22/2014   Cervical cancer (HCC) 09/01/2013   Adenocarcinoma of cervix, 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   Asthma, mild intermittent 07/07/2009   INSOMNIA 05/28/2008   Generalized  anxiety disorder 05/07/2008   HYPERTENSION, BENIGN 05/31/2007    PCP: Geraline Dorothyann JONETTA, MD  REFERRING PROVIDER: Zuleta, Kaitlin G, NP   REFERRING DIAG:  N32.81 (ICD-10-CM) - OAB (overactive bladder)  N32.89 (ICD-10-CM) - Bladder spasms  N39.3 (ICD-10-CM) - SUI (stress urinary incontinence, female)  N39.41 (ICD-10-CM) - Urge incontinence    THERAPY DIAG:  Muscle weakness (generalized)  Pelvic pain  Rationale for Evaluation and Treatment: Rehabilitation  ONSET DATE: 2015  SUBJECTIVE:  SUBJECTIVE STATEMENT: Urologist said her issues are related to the progressive nerve damage.  Fluid intake: Yes: water     PAIN:  Are you having pain? Yes NPRS scale: 0/10, 10/05/23 Pain location:  lower abdominal  Pain type: aching Pain description: constant   Aggravating factors: walking Relieving factors: try not to walk  PRECAUTIONS: Other: cervical cancer with radiation  RED FLAGS: None   WEIGHT BEARING RESTRICTIONS: No  FALLS:  Has patient fallen in last 6 months? Yes. 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; unable to sit with legs down due to them falling asleep  PLOF: Independent  PATIENT GOALS: to work on core and pelvic floor                                                                      PERTINENT HISTORY:  ADHD; Cervical Cancer 2014; 9 weeks of radiation; Radical hysterectomy;   BOWEL MOVEMENT: Pain with bowel movement: No Type of bowel movement:Type (Bristol Stool Scale) Type 4, Frequency daily, Strain No, and Splinting no  has to focus on the abdominal muscles to work a certain way  for a bowel movement Fully empty rectum: No, always feels like the rectal area is full Leakage: No Fiber supplement: No  URINATION: Pain with urination: No Fully empty bladder: Yes:   Stream: Weak Urgency: No Frequency: every 2-3 hours Leakage: Urge to void, Walking to the bathroom, Coughing, Sneezing, Laughing, Exercise, Lifting, and Bending forward leaks with all when she waits too long Pads: Yes: incontinence underwear  INTERCOURSE: Pain with intercourse:  only if she does it a certain way Ability to have vaginal penetration:  Yes:   Climax: yes Marinoff Scale: 0/3  PREGNANCY: Vaginal deliveries 1   OBJECTIVE:  Note: Objective measures were completed at Evaluation unless otherwise noted.   COGNITION: Overall cognitive status: Within functional limits for tasks assessed      GAIT: Assistive device utilized: Single point cane Level of assistance: Complete Independence Comments: uses a wheelchair in public  POSTURE: No Significant postural limitations  PELVIC ALIGNMENT:equal   PALPATION:                External Perineal Exam Intact                             Internal Pelvic Floor decreased mobility of the perineal body, tightness along the introitus, levator ani and obturator internist, vaginal canal is shortened; decreased mobility around the bladder  Patient confirms identification and approves PT to assess internal pelvic floor and treatment Yes  PELVIC MMT:   MMT eval 08/20/23  Vaginal 2/5 2/5 with slight lift  (Blank rows = not tested)        TONE: average  PROLAPSE: none  TODAY'S TREATMENT:   10/05/23 Manual: Soft tissue mobilization: Manual work to the lower abdomen.  Manual work to the diaphragm Manual work to the rectus abdominus Myofascial release: Fascial release to the lower abdomen, around the lateral sides of the abdomen, around the bladder, and  the midline of the abdomen to release the restricted tissue and reduce the pain.   Exercises: Stretches/mobility: Hamstring stretch bil. 30 sec supine Piriformis supine bil. 30 sec  Strengthening: Supine with knees flexed hip adduction with internal rotation and core engaged without lifting her hip Supine with legs straight and press ball between knees to work the hip adductors     09/24/23 Manual: Soft tissue mobilization: Manual work to the lower abdomen.  Manual work to the diaphragm Myofascial release: Fascial release to the lower abdomen, around the lateral sides of the abdomen, around the bladder, and the midline of the abdomen to release the restricted tissue and reduce the pain.   09/18/23 Manual: Soft tissue mobilization: Soft tissue work to the diaphragm, along the abdominals  Scar tissue mobilization: Manual work to the scar in lower abdomen Tissue rolling of scar to release of the tissue Myofascial release: Fascial release around the lower abdomen going through the restrictions and feeling the release of the tissue Exercises: Strengthening: Supine transverse abdominus engagement Supine ball squeeze with pulling yellow band across body with abdominal engagement 10 x each way    08/31/23 Manual: Myofascial release: Fascial release around the lower abdomen going through the restrictions and feeling the release of the tissue Exercises: Strengthening: Diaphragmatic breathing with abdominal contraction in supine, contracting equally and not bulge the lower abdomen Supine press hands into the thighs and contract the abdomen        PATIENT EDUCATION: 08/31/23 Education details: Access Code: [email or web address], educated patient on moisturizers, educated patient on how to massage her abdomen, educated patient on how to massage the pelvic floor muscles with her vaginal massager Person educated: Patient Education method: Explanation, Demonstration, Tactile cues, Verbal cues, and Handouts Education comprehension: verbalized understanding, returned  demonstration, verbal cues required, tactile cues required, and needs further education    HOME EXERCISE PROGRAM: 08/31/23 Access Code: [email or web address] URL: [email or web address] Date: 08/31/2023 Prepared by: Channing Pereyra  Exercises - Supine Diaphragmatic Breathing  - 2 x daily - 7 x weekly - 1 sets - 10 reps - Hooklying Transversus Abdominis Palpation  - 1 x daily - 7 x weekly - 1 sets - 10 reps - Seated Transversus Abdominis Bracing  - 1 x daily - 7 x weekly - 1 sets - 10 reps - Supine Transversus Abdominis Bracing - Hands on Thighs  - 1 x daily - 7 x weekly - 1 sets - 10 reps    ASSESSMENT:  CLINICAL IMPRESSION: Patient is a 58 y.o. female who was seen today for physical therapy  treatment for Overactive bladder, stress incontinence,bladder spasms . Urinary leakage improved by 95%. Patient is now able to wear regular underwear for the first time due to decreased leakage. Patient had tightness in the abdomen today and had good sounds in the abdomen with the manual work. Patient was able to engage the hip adductors for the first time but will lift her pelvis to assist. She needs tactile cues to engage the muscles correctly with hip adduction. She was able to walk with her legs closer together after her  Patient would benefit from skilled therapy to work on pelvic floor strength and coordination to reduce her leakage.   OBJECTIVE IMPAIRMENTS: decreased coordination, decreased endurance, decreased strength, increased fascial restrictions, increased muscle spasms, and pain.   ACTIVITY LIMITATIONS: continence and locomotion level  PARTICIPATION LIMITATIONS: community activity and occupation  PERSONAL FACTORS: Time since onset of injury/illness/exacerbation are also affecting patient's functional outcome.   REHAB POTENTIAL: Good  CLINICAL DECISION MAKING: Evolving/moderate complexity  EVALUATION COMPLEXITY: Moderate   GOALS: Goals reviewed with patient? Yes  SHORT TERM  GOALS: Target date: 09/02/23  Patient is independent with manual work to the pelvic floor to lengthen the tissue.  Baseline: Goal status: Met 08/20/23  2.  Patient understands on vaginal moisturizers to improve vaginal health.  Baseline:  Goal status: Met 08/20/23  3.  Patient is able to contract and fully lengthen her pelvic floor.  Baseline:  Goal status: ongoing 10/05/23   LONG TERM GOALS: Target date: 02/03/24  Patient is independent with advanced HEP to increase pelvic floor and core strength.  Baseline:  Goal status: INITIAL  2.  Patient pelvic floor strength is >/= 3/5 so her urinary leakage decreased >/= 60%.  Baseline: leakage improved by 95% Goal status:  ongoing 10/05/23  3.  Patient is able to bend forward with minimal to no urinary leakage due to improved coordination with hip flexion.  Baseline:  Goal status: INITIAL  4.  Patient is able to use the urge to void behavioral technique to delay the urge to give her time to go to the bathroom.  Baseline:  Goal status: INITIAL   PLAN:  PT FREQUENCY: 1x/week  PT DURATION: 6 months  PLANNED INTERVENTIONS: 97110-Therapeutic exercises, 97530- Therapeutic activity, [id number]- Neuromuscular re-education, 97140- Manual therapy, 97014- Electrical stimulation (unattended), [phone]- Electrical stimulation (manual), Patient/Family education, Dry Needling, and Biofeedback  PLAN FOR NEXT SESSION: manual work to the pelvic floor, around the bladder, abdominal bracing,  pelvic floor contraction, working the hp adductors with abdominals and pelvic floor   Channing Pereyra, PT 10/05/23 10:20 AM

## 2023-10-08 ENCOUNTER — Encounter: Payer: Self-pay | Admitting: Physical Therapy

## 2023-10-08 ENCOUNTER — Ambulatory Visit: Payer: 59 | Admitting: Physical Therapy

## 2023-10-08 DIAGNOSIS — M6281 Muscle weakness (generalized): Secondary | ICD-10-CM | POA: Diagnosis not present

## 2023-10-08 DIAGNOSIS — R102 Pelvic and perineal pain: Secondary | ICD-10-CM

## 2023-10-08 NOTE — Therapy (Signed)
 OUTPATIENT PHYSICAL THERAPY FEMALE PELVIC TREATMENT   Patient Name: Anna Ramsey MRN: [id number] DOB:May 01, 1966, 58 y.o., female Today's Date: 10/08/2023  END OF SESSION:  PT End of Session - 10/08/23 0757     Visit Number 8    Date for PT Re-Evaluation 02/03/24    Authorization Type [employer]    PT Start Time 0800    PT Stop Time 0840    PT Time Calculation (min) 40 min    Activity Tolerance Patient tolerated treatment well    Behavior During Therapy [facility] for tasks assessed/performed             Past Medical History:  Diagnosis Date   ADHD (attention deficit hyperactivity disorder) 2024   Dysuria    Erythematous bladder mucosa    Heart murmur    asymptomatic per pt --  no echo   Hematuria    History of cervical cancer    12/ 2014  Stage IB2--  s/p  TAH w/ BSO and pelvic lymphadectomy/  and radiation therapy   History of radiation therapy 10/20/13-11/28/13   pelvis 50.4 Teresia Myint   Hypertension    Lesion of bladder    Mild intermittent asthma    Neuromuscular disorder (HCC)    Seasonal allergies    Thyroid disease    Past Surgical History:  Procedure Laterality Date   CYSTOSCOPY WITH BIOPSY N/A 04/29/2015   Procedure: CYSTOSCOPY WITH BIOPSY WITH FULGERATION;  Surgeon: Belvie LITTIE Clara, MD;  Location: [facility];  Service: Urology;  Laterality: N/A;  1 HR [phone] [id number]   FOOT SURGERY Left 2010   RADICAL ABDOMINAL HYSTERECTOMY  09-05-2013   w/ BILATERAL SALPINGOOPHORECTOMY AND PELVIC LYMPHADECTOMY   Patient Active Problem List   Diagnosis Date Noted   Preventive measure 12/20/2022   Stress and adjustment reaction 01/13/2021   Inattention 10/28/2019   Hypothyroid 11/13/2016   Neuropathy involving both lower extremities 11/13/2016   Cystitis, radiation 05/22/2014   Cervical cancer (HCC) 09/01/2013   Adenocarcinoma of cervix, 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   Asthma, mild intermittent 07/07/2009   INSOMNIA 05/28/2008   Generalized  anxiety disorder 05/07/2008   HYPERTENSION, BENIGN 05/31/2007    PCP: Geraline Dorothyann JONETTA, MD  REFERRING PROVIDER: Zuleta, Kaitlin G, NP   REFERRING DIAG:  N32.81 (ICD-10-CM) - OAB (overactive bladder)  N32.89 (ICD-10-CM) - Bladder spasms  N39.3 (ICD-10-CM) - SUI (stress urinary incontinence, female)  N39.41 (ICD-10-CM) - Urge incontinence    THERAPY DIAG:  Muscle weakness (generalized)  Pelvic pain  Rationale for Evaluation and Treatment: Rehabilitation  ONSET DATE: 2015  SUBJECTIVE:  SUBJECTIVE STATEMENT: I feel really good after last visit. I have been walking and engaging the hip adductors more.  Fluid intake: Yes: water     PAIN:  Are you having pain? Yes NPRS scale: 0/10, 10/05/23 Pain location:  lower abdominal  Pain type: aching Pain description: constant   Aggravating factors: walking Relieving factors: try not to walk  PRECAUTIONS: Other: cervical cancer with radiation  RED FLAGS: None   WEIGHT BEARING RESTRICTIONS: No  FALLS:  Has patient fallen in last 6 months? Yes. 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; unable to sit with legs down due to them falling asleep  PLOF: Independent  PATIENT GOALS: to work on core and pelvic floor                                                                      PERTINENT HISTORY:  ADHD; Cervical Cancer 2014; 9 weeks of radiation; Radical hysterectomy;   BOWEL MOVEMENT: Pain with bowel movement: No Type of bowel movement:Type (Bristol Stool Scale) Type 4, Frequency daily, Strain No, and Splinting no  has to focus on the abdominal muscles  to work a certain way for a bowel movement Fully empty rectum: No, always feels like the rectal area is full Leakage: No Fiber supplement: No  URINATION: Pain with urination: No Fully empty bladder: Yes:   Stream: Weak Urgency: No Frequency: every 2-3 hours Leakage: Urge to void, Walking to the bathroom,   Lifting, and Bending forward leaks with all when she waits too long Pads: now wearing regular panties  INTERCOURSE: Pain with intercourse:  only if she does it a certain way Ability to have vaginal penetration:  Yes:   Climax: yes Marinoff Scale: 0/3  PREGNANCY: Vaginal deliveries 1   OBJECTIVE:  Note: Objective measures were completed at Evaluation unless otherwise noted.   COGNITION: Overall cognitive status: Within functional limits for tasks assessed      GAIT: Assistive device utilized: Single point cane Level of assistance: Complete Independence Comments: uses a wheelchair in public  POSTURE: No Significant postural limitations  PELVIC ALIGNMENT:equal   PALPATION:                External Perineal Exam Intact                             Internal Pelvic Floor decreased mobility of the perineal body, tightness along the introitus, levator ani and obturator internist, vaginal canal is shortened; decreased mobility around the bladder  Patient confirms identification and approves PT to assess internal pelvic floor and treatment Yes  PELVIC MMT:   MMT eval 08/20/23 10/08/23  Vaginal 2/5 2/5 with slight lift 2/5 with lots of tactile and verbal cues  (Blank rows = not tested)        TONE: average  PROLAPSE: none  TODAY'S TREATMENT:   10/08/23 Manual: Internal pelvic floor techniques: No emotional/communication barriers or cognitive limitation. Patient is motivated to learn. Patient understands and agrees with treatment goals and plan. PT explains  patient will be examined in standing, sitting, and lying down to see how their muscles and joints work. When  they are ready, they will be asked to remove their underwear so PT can examine their perineum. The patient is also given the option of providing their own chaperone as one is not provided in our facility. The patient also has the right and is explained the right to defer or refuse any part of the evaluation or treatment including the internal exam. With the patient's consent, PT will use one gloved finger to gently assess the muscles of the pelvic floor, seeing how well it contracts and relaxes and if there is muscle symmetry. After, the patient will get dressed and PT and patient will discuss exam findings and plan of care. PT and patient discuss plan of care, schedule, attendance policy and HEP activities. Therapist finger in the vaginal canal working on the levator ani, obturator internist, along the sides of the bladder with hands on the suprapubic area externally  Exercises: Strengthening: Supine foot on ball and heel slides with foot on ball with therapist assistance to keep the knee from falling inward Supine with knee bent heel raises keeping control of knee and therapist assisting to prevent going inward Therapist finger in the vaginal canal with giving tactile cues to contract the pelvic floor, hands on the lower abdomen to assist the bladder to lift    10/05/23 Manual: Soft tissue mobilization: Manual work to the lower abdomen.  Manual work to the diaphragm Manual work to the rectus abdominus Myofascial release: Fascial release to the lower abdomen, around the lateral sides of the abdomen, around the bladder, and the midline of the abdomen to release the restricted tissue and reduce the pain.  Exercises: Stretches/mobility: Hamstring stretch bil. 30 sec supine Piriformis supine bil. 30 sec  Strengthening: Supine with knees flexed hip adduction with internal rotation and core engaged without lifting her hip Supine with legs straight and press ball between knees to work the hip  adductors     09/24/23 Manual: Soft tissue mobilization: Manual work to the lower abdomen.  Manual work to the diaphragm Myofascial release: Fascial release to the lower abdomen, around the lateral sides of the abdomen, around the bladder, and the midline of the abdomen to release the restricted tissue and reduce the pain.      PATIENT EDUCATION: 08/31/23 Education details: Access Code: [email or web address], educated patient on moisturizers, educated patient on how to massage her abdomen, educated patient on how to massage the pelvic floor muscles with her vaginal massager Person educated: Patient Education method: Explanation, Demonstration, Tactile cues, Verbal cues, and Handouts Education comprehension: verbalized understanding, returned demonstration, verbal cues required, tactile cues required, and needs further education    HOME EXERCISE PROGRAM: 08/31/23 Access Code: [email or web address] URL: [email or web address] Date: 08/31/2023 Prepared by: Channing Pereyra  Exercises - Supine Diaphragmatic Breathing  - 2 x daily - 7 x weekly - 1 sets - 10 reps - Hooklying Transversus Abdominis Palpation  - 1 x daily - 7 x weekly - 1 sets - 10 reps - Seated Transversus Abdominis Bracing  - 1 x daily - 7 x weekly - 1 sets - 10 reps - Supine Transversus Abdominis Bracing - Hands on Thighs  - 1 x daily - 7 x weekly - 1 sets - 10 reps    ASSESSMENT:  CLINICAL IMPRESSION: Patient is a 58 y.o. female who was seen today for physical therapy  treatment for Overactive bladder, stress incontinence,bladder spasms . She  is not leaking with coughing and sneezing. She needs tactile and verbal cues to lift the vaginal canal. She needs to see her doing the exercises to get a connection between the brain and muscles. Pelvic floor strength is 2/5 with lots of tactile cues and verbal cues.   Patient would benefit from skilled therapy to work on pelvic floor strength and coordination to reduce her leakage.   OBJECTIVE  IMPAIRMENTS: decreased coordination, decreased endurance, decreased strength, increased fascial restrictions, increased muscle spasms, and pain.   ACTIVITY LIMITATIONS: continence and locomotion level  PARTICIPATION LIMITATIONS: community activity and occupation  PERSONAL FACTORS: Time since onset of injury/illness/exacerbation are also affecting patient's functional outcome.   REHAB POTENTIAL: Good  CLINICAL DECISION MAKING: Evolving/moderate complexity  EVALUATION COMPLEXITY: Moderate   GOALS: Goals reviewed with patient? Yes  SHORT TERM GOALS: Target date: 09/02/23  Patient is independent with manual work to the pelvic floor to lengthen the tissue.  Baseline: Goal status: Met 08/20/23  2.  Patient understands on vaginal moisturizers to improve vaginal health.  Baseline:  Goal status: Met 08/20/23  3.  Patient is able to contract and fully lengthen her pelvic floor.  Baseline:  Goal status: ongoing 10/05/23   LONG TERM GOALS: Target date: 02/03/24  Patient is independent with advanced HEP to increase pelvic floor and core strength.  Baseline:  Goal status: INITIAL  2.  Patient pelvic floor strength is >/= 3/5 so her urinary leakage decreased >/= 60%.  Baseline: leakage improved by 95% Goal status:  ongoing 10/05/23  3.  Patient is able to bend forward with minimal to no urinary leakage due to improved coordination with hip flexion.  Baseline:  Goal status: INITIAL  4.  Patient is able to use the urge to void behavioral technique to delay the urge to give her time to go to the bathroom.  Baseline:  Goal status: INITIAL   PLAN:  PT FREQUENCY: 1x/week  PT DURATION: 6 months  PLANNED INTERVENTIONS: 97110-Therapeutic exercises, 97530- Therapeutic activity, [id number]- Neuromuscular re-education, 97140- Manual therapy, 97014- Electrical stimulation (unattended), [id number]- Electrical stimulation (manual), Patient/Family education, Dry Needling, and Biofeedback  PLAN FOR  NEXT SESSION: manual work to abdomen or pelvic floor, work on lifting and bending forward  Channing Pereyra, PT 10/08/23 8:48 AM

## 2023-10-12 ENCOUNTER — Other Ambulatory Visit: Payer: Self-pay | Admitting: Family Medicine

## 2023-10-12 DIAGNOSIS — F411 Generalized anxiety disorder: Secondary | ICD-10-CM

## 2023-10-15 ENCOUNTER — Other Ambulatory Visit: Payer: Self-pay | Admitting: *Deleted

## 2023-10-15 ENCOUNTER — Ambulatory Visit: Payer: 59 | Admitting: Physical Therapy

## 2023-10-15 ENCOUNTER — Encounter: Payer: Self-pay | Admitting: Physical Therapy

## 2023-10-15 DIAGNOSIS — M6281 Muscle weakness (generalized): Secondary | ICD-10-CM

## 2023-10-15 DIAGNOSIS — R102 Pelvic and perineal pain: Secondary | ICD-10-CM

## 2023-10-15 NOTE — Therapy (Signed)
OUTPATIENT PHYSICAL THERAPY FEMALE PELVIC TREATMENT   Patient Name: Anna Ramsey MRN: [id number] DOB:[birth year], 58 y.o., female Today's Date: 10/15/2023  END OF SESSION:  PT End of Session - 10/15/23 0846     Visit Number 9    Date for PT Re-Evaluation 02/03/24    Authorization Type [employer]    PT Start Time 0845    PT Stop Time 0925    PT Time Calculation (min) 40 min    Activity Tolerance Patient tolerated treatment well    Behavior During Therapy [facility] for tasks assessed/performed             Past Medical History:  Diagnosis Date   ADHD (attention deficit hyperactivity disorder) 2024   Dysuria    Erythematous bladder mucosa    Heart murmur    asymptomatic per pt --  no echo   Hematuria    History of cervical cancer    12/ 2014  Stage IB2--  s/p  TAH w/ BSO and pelvic lymphadectomy/  and radiation therapy   History of radiation therapy 10/20/13-11/28/13   pelvis 50.4 Lothar Prehn   Hypertension    Lesion of bladder    Mild intermittent asthma    Neuromuscular disorder (HCC)    Seasonal allergies    Thyroid disease    Past Surgical History:  Procedure Laterality Date   CYSTOSCOPY WITH BIOPSY N/A 04/29/2015   Procedure: CYSTOSCOPY WITH BIOPSY WITH FULGERATION;  Surgeon: Malen Gauze, MD;  Location: [facility];  Service: Urology;  Laterality: N/A;  1 HR [phone] [id number]   FOOT SURGERY Left 2010   RADICAL ABDOMINAL HYSTERECTOMY  09-05-2013   w/ BILATERAL SALPINGOOPHORECTOMY AND PELVIC LYMPHADECTOMY   Patient Active Problem List   Diagnosis Date Noted   Preventive measure 12/20/2022   Stress and adjustment reaction 01/13/2021   Inattention 10/28/2019   Hypothyroid 11/13/2016   Neuropathy involving both lower extremities 11/13/2016   Cystitis, radiation 05/22/2014   Cervical cancer (HCC) 09/01/2013   Adenocarcinoma of cervix, 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   Asthma, mild intermittent 07/07/2009   INSOMNIA 05/28/2008   Generalized  anxiety disorder 05/07/2008   HYPERTENSION, BENIGN 05/31/2007    PCP: Merrilee Seashore, MD  REFERRING PROVIDER: Selmer Dominion, NP   REFERRING DIAG:  N32.81 (ICD-10-CM) - OAB (overactive bladder)  N32.89 (ICD-10-CM) - Bladder spasms  N39.3 (ICD-10-CM) - SUI (stress urinary incontinence, female)  N39.41 (ICD-10-CM) - Urge incontinence    THERAPY DIAG:  Muscle weakness (generalized)  Pelvic pain  Rationale for Evaluation and Treatment: Rehabilitation  ONSET DATE: 2015  SUBJECTIVE:  SUBJECTIVE STATEMENT: I am not hurting in the abdomen but my rest of the body hurts. I had my grandchildren so I was active.  Fluid intake: Yes: water    PAIN:  Are you having pain? Yes NPRS scale: 0/10, 10/15/23 Pain location:  lower abdominal  Pain type: aching Pain description: constant   Aggravating factors: walking Relieving factors: try not to walk  PRECAUTIONS: Other: cervical cancer with radiation  RED FLAGS: None   WEIGHT BEARING RESTRICTIONS: No  FALLS:  Has patient fallen in last 6 months? Yes. 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; unable to sit with legs down due to them falling asleep  PLOF: Independent  PATIENT GOALS: to work on core and pelvic floor                                                                      PERTINENT HISTORY:  ADHD; Cervical Cancer 2014; 9 weeks of radiation; Radical hysterectomy;   BOWEL MOVEMENT: Pain with bowel movement: No Type of bowel movement:Type (Bristol Stool Scale) Type 4, Frequency daily, Strain No, and Splinting no  has to focus on the abdominal  muscles to work a certain way for a bowel movement Fully empty rectum: No, always feels like the rectal area is full Leakage: No Fiber supplement: No  URINATION: Pain with urination: No Fully empty bladder: Yes:   Stream: Weak Urgency: No Frequency: every 2-3 hours Leakage: Urge to void, Walking to the bathroom,   Lifting, and Bending forward leaks with all when she waits too long Pads: now wearing regular panties  INTERCOURSE: Pain with intercourse:  only if she does it a certain way Ability to have vaginal penetration:  Yes:   Climax: yes Marinoff Scale: 0/3  PREGNANCY: Vaginal deliveries 1   OBJECTIVE:  Note: Objective measures were completed at Evaluation unless otherwise noted.   COGNITION: Overall cognitive status: Within functional limits for tasks assessed      GAIT: Assistive device utilized: Single point cane Level of assistance: Complete Independence Comments: uses a wheelchair in public  POSTURE: No Significant postural limitations  PELVIC ALIGNMENT:equal   PALPATION:                External Perineal Exam Intact                             Internal Pelvic Floor decreased mobility of the perineal body, tightness along the introitus, levator ani and obturator internist, vaginal canal is shortened; decreased mobility around the bladder  Patient confirms identification and approves PT to assess internal pelvic floor and treatment Yes  PELVIC MMT:   MMT eval 08/20/23 10/08/23  Vaginal 2/5 2/5 with slight lift 2/5 with lots of tactile and verbal cues  (Blank rows = not tested)        TONE: average  PROLAPSE: none  TODAY'S TREATMENT:   10/15/23 Exercises: Stretches/mobility: Using strap stretch hamstring holding 30 sec bil.  Using strap stretch lateral hip holding 30 sec bil.  Bilateral  knees to chest holding 30 sec.  Piriformis stretch in supine holding 30 bil.  Strengthening:(all exercises engage the core and pelvic floor) Heel slide with  yellow band giving gentle hip adductor resistance and guidance for control 10 x bil.  Supine ball squeeze hold 5 sec 10 x  Supine marching with ball squeeze and yellow band around knees 10 x each  Lay on side with squeeze the ball then lift the top leg up with yellow band around thighs 10 x each     10/08/23 Manual: Internal pelvic floor techniques: No emotional/communication barriers or cognitive limitation. Patient is motivated to learn. Patient understands and agrees with treatment goals and plan. PT explains patient will be examined in standing, sitting, and lying down to see how their muscles and joints work. When they are ready, they will be asked to remove their underwear so PT can examine their perineum. The patient is also given the option of providing their own chaperone as one is not provided in our facility. The patient also has the right and is explained the right to defer or refuse any part of the evaluation or treatment including the internal exam. With the patient's consent, PT will use one gloved finger to gently assess the muscles of the pelvic floor, seeing how well it contracts and relaxes and if there is muscle symmetry. After, the patient will get dressed and PT and patient will discuss exam findings and plan of care. PT and patient discuss plan of care, schedule, attendance policy and HEP activities. Therapist finger in the vaginal canal working on the levator ani, obturator internist, along the sides of the bladder with hands on the suprapubic area externally  Exercises: Strengthening: Supine foot on ball and heel slides with foot on ball with therapist assistance to keep the knee from falling inward Supine with knee bent heel raises keeping control of knee and therapist assisting to prevent going inward Therapist finger in the vaginal canal with giving tactile cues to contract the pelvic floor, hands on the lower abdomen to assist the bladder to lift     10/05/23 Manual: Soft tissue mobilization: Manual work to the lower abdomen.  Manual work to the diaphragm Manual work to the rectus abdominus Myofascial release: Fascial release to the lower abdomen, around the lateral sides of the abdomen, around the bladder, and the midline of the abdomen to release the restricted tissue and reduce the pain.  Exercises: Stretches/mobility: Hamstring stretch bil. 30 sec supine Piriformis supine bil. 30 sec  Strengthening: Supine with knees flexed hip adduction with internal rotation and core engaged without lifting her hip Supine with legs straight and press ball between knees to work the hip adductors      PATIENT EDUCATION: 10/15/23 Education details: Access Code: [email or web address], educated patient on moisturizers, educated patient on how to massage her abdomen, educated patient on how to massage the pelvic floor muscles with her vaginal massager Person educated: Patient Education method: Explanation, Demonstration, Tactile cues, Verbal cues, and Handouts Education comprehension: verbalized understanding, returned demonstration, verbal cues required, tactile cues required, and needs further education    HOME EXERCISE PROGRAM: 10/15/23 Access Code: [email or web address] URL: [email or web address] Date: 10/15/2023 Prepared by: Eulis Foster  Exercises - Supine Diaphragmatic Breathing  - 2 x daily - 7 x weekly - 1 sets - 10 reps - Hooklying Transversus Abdominis Palpation  - 1 x daily - 7 x weekly - 1 sets - 10 reps - Seated Transversus Abdominis Bracing  - 1 x daily -  7 x weekly - 1 sets - 10 reps - Supine Transversus Abdominis Bracing - Hands on Thighs  - 1 x daily - 7 x weekly - 1 sets - 10 reps - Supine Hip Adduction Isometric with Ball  - 1 x daily - 3 x weekly - 1 sets - 10 reps - 5 sec hold - Supine Heel Slide with Strap  - 1 x daily - 3 x weekly - 1 sets - 10 reps - Supine March with Resistance Band  - 1 x daily - 3 x weekly - 1 sets - 10  reps - Sidelying Hip Adduction Isometric with Ball  - 1 x daily - 3 x weekly - 1 sets - 10 reps  ASSESSMENT:  CLINICAL IMPRESSION: Patient is a 58 y.o. female who was seen today for physical therapy  treatment for Overactive bladder, stress incontinence,bladder spasms . Patient feels like she is using her abdominals now compared to before she was not able to use the muscles. She needs to think and see the leg movements to assist with strengthening. She is contracting her lower and upper abdominals correctly. She is able to roll onto her side and keep the leg in the air.   After the session she was able to feel her hip adductors engaged. Patient would benefit from skilled therapy to work on pelvic floor strength and coordination to reduce her leakage.   OBJECTIVE IMPAIRMENTS: decreased coordination, decreased endurance, decreased strength, increased fascial restrictions, increased muscle spasms, and pain.   ACTIVITY LIMITATIONS: continence and locomotion level  PARTICIPATION LIMITATIONS: community activity and occupation  PERSONAL FACTORS: Time since onset of injury/illness/exacerbation are also affecting patient's functional outcome.   REHAB POTENTIAL: Good  CLINICAL DECISION MAKING: Evolving/moderate complexity  EVALUATION COMPLEXITY: Moderate   GOALS: Goals reviewed with patient? Yes  SHORT TERM GOALS: Target date: 09/02/23  Patient is independent with manual work to the pelvic floor to lengthen the tissue.  Baseline: Goal status: Met 08/20/23  2.  Patient understands on vaginal moisturizers to improve vaginal health.  Baseline:  Goal status: Met 08/20/23  3.  Patient is able to contract and fully lengthen her pelvic floor.  Baseline:  Goal status: ongoing 10/05/23   LONG TERM GOALS: Target date: 02/03/24  Patient is independent with advanced HEP to increase pelvic floor and core strength.  Baseline:  Goal status: INITIAL  2.  Patient pelvic floor strength is >/= 3/5 so  her urinary leakage decreased >/= 60%.  Baseline: leakage improved by 95% Goal status:  ongoing 10/05/23  3.  Patient is able to bend forward with minimal to no urinary leakage due to improved coordination with hip flexion.  Baseline:  Goal status: INITIAL  4.  Patient is able to use the urge to void behavioral technique to delay the urge to give her time to go to the bathroom.  Baseline:  Goal status: INITIAL   PLAN:  PT FREQUENCY: 1x/week  PT DURATION: 6 months  PLANNED INTERVENTIONS: 97110-Therapeutic exercises, 97530- Therapeutic activity, [id number]- Neuromuscular re-education, 97140- Manual therapy, 97014- Electrical stimulation (unattended), [id number]- Electrical stimulation (manual), Patient/Family education, Dry Needling, and Biofeedback  PLAN FOR NEXT SESSION: manual work to abdomen or pelvic floor, work on lifting and bending forward, exercise the core  Eulis Foster, PT 10/15/23 9:25 AM

## 2023-10-17 ENCOUNTER — Other Ambulatory Visit: Payer: Self-pay | Admitting: Medical Genetics

## 2023-10-18 ENCOUNTER — Encounter: Payer: Self-pay | Admitting: Obstetrics

## 2023-10-18 ENCOUNTER — Ambulatory Visit: Payer: 59 | Admitting: Obstetrics

## 2023-10-18 VITALS — BP 120/83 | HR 69

## 2023-10-18 DIAGNOSIS — Z87898 Personal history of other specified conditions: Secondary | ICD-10-CM

## 2023-10-18 DIAGNOSIS — R319 Hematuria, unspecified: Secondary | ICD-10-CM | POA: Diagnosis not present

## 2023-10-18 DIAGNOSIS — N319 Neuromuscular dysfunction of bladder, unspecified: Secondary | ICD-10-CM

## 2023-10-18 DIAGNOSIS — R338 Other retention of urine: Secondary | ICD-10-CM | POA: Insufficient documentation

## 2023-10-18 LAB — POCT URINALYSIS DIPSTICK
Blood, UA: NEGATIVE
Glucose, UA: NEGATIVE
Ketones, UA: NEGATIVE
Nitrite, UA: NEGATIVE
Protein, UA: NEGATIVE
Spec Grav, UA: 1.025 (ref 1.010–1.025)
Urobilinogen, UA: 1 U/dL
pH, UA: 5 (ref 5.0–8.0)

## 2023-10-18 MED ORDER — LIDOCAINE HCL URETHRAL/MUCOSAL 2 % EX GEL: 1.0000 | Freq: Once | CUTANEOUS | Status: AC

## 2023-10-18 NOTE — Patient Instructions (Addendum)
Taking Care of Yourself after Urodynamics, Cystoscopy, Bulkamid Injection, or Botox Injection   Drink plenty of water for a day or two following your procedure. Try to have about 8 ounces (one cup) at a time, and do this 6 times or more per day unless you have fluid restrictitons AVOID irritative beverages such as coffee, tea, soda, alcoholic or citrus drinks for a day or two, as this may cause burning with urination.  For the first 1-2 days after the procedure, your urine may be pink or red in color. You may have some blood in your urine as a normal side effect of the procedure. Large amounts of bleeding or difficulty urinating are NOT normal. Call the nurse line if this happens or go to the nearest Emergency Room if the bleeding is heavy or you cannot urinate at all and it is after hours. If you had a Bulkamid injection in the urethra and need to be catheterized, ask for a pediatric catheter to be used (size 10 or 12-French) so the material is not pushed out of place.   You may experience some discomfort or a burning sensation with urination after having this procedure. You can use over the counter Azo or pyridium to help with burning and follow the instructions on the packaging. If it does not improve within 1-2 days, or other symptoms appear (fever, chills, or difficulty urinating) call the office to speak to a nurse.  You may return to normal daily activities such as work, school, driving, exercising and housework on the day of the procedure. If your doctor gave you a prescription, take it as ordered.    Start clean intermittent catheterization. We will review your bladder diary in 1-2 weeks  Please return to urology for video urodynamics

## 2023-10-18 NOTE — Progress Notes (Signed)
CYSTOSCOPY  CC: This is a 58 y.o. with  mixed urinary incontinence, microscopic hematuria   who presents today for cystoscopy. 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 Cystoscopy with biopsy and fulguration 04/29/15 by Dr. 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   "Diagnosis 1. Bladder, biopsy, posterior wall - FOCALLY DENUDED BENIGN UROTHELIUM. 2. Bladder, biopsy, dome - FOCALLY DENUDED BENIGN UROTHELIUM. Valinda Hoar MD Pathologist, Electronic Signature (Case signed 04/30/2015)"  On Gemtesa for OAB and vaginal estrogen with history of chronic inflammatory demyelinating polyradiculoneuropathy  Per chart review, initially evaluated in ED 04/27/23 for urinary retention with PVR > on Detrol. CT with bladder wall thickening consistent with cystitis and mild to moderate ascites of unknown etiology. Rx cipro, pyridium and foley placement Passed void trial on 05/10/23, recommended timed voids and return to office if she experiences difficulty voiding.  Timed voids due to decreased sensation, reported leakage around indwelling catheter.  Evaluated in ED for urinary retention 09/23/23, prior urinary retention attributed to Detrol S/p pelvic floor PT and CIC teaching Evaluated by [facility] and started on hyoscyamine.  Day time voids 10.  Nocturia: 1 times per night to void  Drinks: 80-120 oz Water with Clear Mio per day  Leaks 5-6x/day Fecal incontinence 2x/month with sensation of incomplete evacuation Reports last  urodynamics 3-4 years ago Reports increasing urinary leakage and decreased bladder sensation since 2016 on Detrol with acute onset urinary retention that resulted in ED visits x 2.   Lab Results  Component Value Date   COLORU yellow 10/18/2023   CLARITYU clear  10/18/2023   GLUCOSEUR Negative 10/18/2023   BILIRUBINUR small 10/18/2023   KETONESU Negative 10/18/2023   SPECGRAV 1.025 10/18/2023   RBCUR negative 10/18/2023   PHUR 5.0 10/18/2023   PROTEINUR Negative 10/18/2023   UROBILINOGEN 1.0 10/18/2023   LEUKOCYTESUR Trace (A) 10/18/2023   Lab Results  Component Value Date   CREATININE 0.74 01/22/2023  Cr 1.19 on 04/27/23 Cr 1.08 on 09/23/23    Post Void Residual - 10/18/23 0842       Post Void Residual   Post Void Residual 9 mL            BP 120/83   Pulse 69   LMP 08/19/2013   CYSTOSCOPY: A time out was performed. The periurethral area was prepped and draped in a sterile manner. 2% lidocaine jetpack was inserted at the urethral meatus and the urethra and bladder visualized with 12- and 70-degree scopes. She had Normal urethral coaptation and Normal urethral mucosa. She had Normal bladder mucosa. She had bilateral clear efflux from both ureteral orifices. She had no squamous metaplasia at the trigone, no trabeculations, cellules or diverticuli.   ASSESSMENT:  58 y.o. with microscopic hematuria and mixed urinary incontinence in the setting of neurogenic bladder . Cystoscopy today is Normal.  PLAN:   - recommended video urodynamics due to recent changes in urinary symptoms and acute urinary retention x 2 to assess compliance and r/o reflux. Discussed risk of DESD and renal impairment in the setting of neurogenic bladder. Office to coordinate vUDS with [facility] and obtain report of prior UDS. - start CIC 1-2x/day and reviewed instruction and timing of CIC based on volume. Provided supplies and diary. Reviewed in 1-2 weeks - discussed increased risk of refractory OAB and reviewed possible botox injections if  pt can consistently CIC - reviewed bladder training and fluid management with recommendation around 64oz and avoid fluid intake 2-3hrs before bedtime - consider flomax if DESD or incomplete emptying noted on vUDS -  continue pelvic floor PT - patient expresses understanding.  Follow-up in 1 weeks to discuss findings and treatment options.  All questions answered and post-procedures instructions were given.  Time spent: I spent 54 minutes dedicated to the care of this patient on the date of this encounter to include pre-visit review of records, face-to-face time with the patient discussing neurogenic bladder, history of urinary retention, CIC, and post visit documentation and testing outside of cystoscopy.

## 2023-10-19 ENCOUNTER — Telehealth: Payer: Self-pay

## 2023-10-19 NOTE — Telephone Encounter (Signed)
Patient records sent to [facility] for request of an appointment for completion of video urodynamics. Will await appointment information.

## 2023-10-24 ENCOUNTER — Other Ambulatory Visit: Payer: Self-pay | Admitting: Family Medicine

## 2023-10-24 MED ORDER — LEVOTHYROXINE SODIUM 88 MCG PO TABS: 88.0000 ug | ORAL_TABLET | Freq: Every day | ORAL | 0 refills | Status: DC

## 2023-10-29 ENCOUNTER — Ambulatory Visit: Payer: 59 | Attending: Obstetrics and Gynecology | Admitting: Physical Therapy

## 2023-10-29 ENCOUNTER — Telehealth: Payer: Self-pay | Admitting: Physical Therapy

## 2023-10-29 DIAGNOSIS — M6281 Muscle weakness (generalized): Secondary | ICD-10-CM | POA: Insufficient documentation

## 2023-10-29 DIAGNOSIS — R102 Pelvic and perineal pain: Secondary | ICD-10-CM | POA: Insufficient documentation

## 2023-10-29 NOTE — Telephone Encounter (Signed)
Patient has not been scheduled at this time. Will check again in a week

## 2023-10-29 NOTE — Telephone Encounter (Signed)
Called patient about her missed appointment today at 16:15. Left a message.  Eulis Foster, PT @2 /3/25@ 4:37 PM

## 2023-11-01 ENCOUNTER — Telehealth (INDEPENDENT_AMBULATORY_CARE_PROVIDER_SITE_OTHER): Payer: 59 | Admitting: Obstetrics

## 2023-11-01 DIAGNOSIS — Z87448 Personal history of other diseases of urinary system: Secondary | ICD-10-CM

## 2023-11-01 DIAGNOSIS — N393 Stress incontinence (female) (male): Secondary | ICD-10-CM | POA: Diagnosis not present

## 2023-11-01 DIAGNOSIS — N319 Neuromuscular dysfunction of bladder, unspecified: Secondary | ICD-10-CM | POA: Diagnosis not present

## 2023-11-01 DIAGNOSIS — Z87898 Personal history of other specified conditions: Secondary | ICD-10-CM | POA: Insufficient documentation

## 2023-11-01 NOTE — Progress Notes (Signed)
 [facility] Return Visit  Patient location: [place], [place] Provider location: [facility]ne[facility]  Patient consents to video call and understands the limitations of telehealth visit.   SUBJECTIVE  History of Present Illness: Anna Ramsey is a 58 y.o. female seen in follow-up for mixed urinary incontinence, microscopic hematuria, history of urinary retention. Plan at last visit was video UDS.   Underwent video UDS at urology today, pending report Per patient noted stress urinary incontinence, did not see urgency UI Reports PVR during pressure flow initially until after she adducts her hips with position change. On Gemtesa  for OAB and vaginal estrogen with history of chronic inflammatory demyelinating polyradiculoneuropathy  CIC volume <89mL at home intermittently.  Drinks: 60oz down from 80-120 oz Water  with Clear Mio per day  Leakage resolved with fluid management, previously UI 5-6x/day Voids 5-7x/day down from 10x/day  History of stage I cervical adenocarcinoma with radical hysterectomy and radiation cystitis.   Past Medical History: Patient  has a past medical history of ADHD (attention deficit hyperactivity disorder) (2024), Dysuria, Erythematous bladder mucosa, Heart murmur, Hematuria, History of cervical cancer, History of radiation therapy (10/20/13-11/28/13), Hypertension, Lesion of bladder, Mild intermittent asthma, Neuromuscular disorder (HCC), Seasonal allergies, and Thyroid disease.   Past Surgical History: She  has a past surgical history that includes Foot surgery (Left, 2010); Radical abdominal hysterectomy (09-05-2013); and Cystoscopy with biopsy (N/A, 04/29/2015).   Medications: She has a current medication list which includes the following prescription(s): albuterol , AMBULATORY NON FORMULARY MEDICATION, amlodipine , baclofen , budesonide -formoterol , bupropion , cetirizine, estradiol , fluticasone , guanfacine hcl,  levothyroxine , methylphenidate, metoprolol  tartrate, nitrofurantoin  (macrocrystal-monohydrate), paroxetine , pregabalin , trazodone , and vibegron , and the following Facility-Administered Medications: lidocaine .   Allergies: Patient is allergic to sulfa antibiotics, cefdinir, and penicillins.   Social History: Patient  reports that she quit smoking about 11 years ago. Her smoking use included e-cigarettes and cigarettes. She started smoking about 36 years ago. She has a 12.5 pack-year smoking history. She has never used smokeless tobacco. She reports current alcohol use of about 1.0 standard drink of alcohol per week. She reports current drug use. Drug: Marijuana.     OBJECTIVE     Physical Exam: There were no vitals filed for this visit. Gen: No apparent distress, A&O x 3.       ASSESSMENT AND PLAN    Ms. Omahoney is a 58 y.o. with:  1. History of urinary retention   2. Neurogenic bladder   3. SUI (stress urinary incontinence, female)     History of urinary retention Assessment & Plan: - ED eval 04/27/23 for urinary retention with PVR > on Detrol. CT with bladder wall thickening consistent with cystitis and mild to moderate ascites of unknown etiology. Rx cipro , pyridium  and foley placement - Passed void trial on 05/10/23, recommended timed voids due to decreased sensation, reported leakage around indwelling catheter.  - ED evaluation for urinary retention 09/23/23 - S/p pelvic floor PT and CIC teaching - started on hyoscyamine by [facility] - underwent video UDS today, pending report - discontinue CIC and resume if clinical change or sensation of incomplete emptying - encouraged to continue pelvic floor relaxation exercises   Neurogenic bladder Assessment & Plan: - history of chronic inflammatory demyelinating polyradiculoneuropathy  - continue Gemtesa  for OAB and vaginal estrogen - urinary leakage resolved with fluid management - pending video UDS  report   SUI (stress urinary incontinence, female) Assessment & Plan: - For treatment of stress urinary incontinence,  non-surgical options include  expectant management, weight loss, physical therapy, as well as a pessary.  Surgical options include a midurethral sling, Burch urethropexy, and transurethral injection of a bulking agent. - pt desires expectant management at this time due to minimal symptoms resolved after behavioral modification with fluid management and Gemtesa  for OAB   Patient desires to call PRN clinical change.   Time spent: I spent 17 minutes dedicated to the care of this patient on the date of this encounter to include pre-visit review of records, face-to-face time with the patient discussing history of urinary retention, neurogenic bladder, SUI and post visit documentation.   Lianne ONEIDA Gillis, MD

## 2023-11-01 NOTE — Assessment & Plan Note (Addendum)
-   ED eval 04/27/23 for urinary retention with PVR > on Detrol. CT with bladder wall thickening consistent with cystitis and mild to moderate ascites of unknown etiology. Rx cipro , pyridium  and foley placement - Passed void trial on 05/10/23, recommended timed voids due to decreased sensation, reported leakage around indwelling catheter.  - ED evaluation for urinary retention 09/23/23 - S/p pelvic floor PT and CIC teaching - started on hyoscyamine by [facility] - underwent video UDS today, pending report - discontinue CIC and resume if clinical change or sensation of incomplete emptying - encouraged to continue pelvic floor relaxation exercises

## 2023-11-01 NOTE — Assessment & Plan Note (Signed)
-   history of chronic inflammatory demyelinating polyradiculoneuropathy  - continue Gemtesa  for OAB and vaginal estrogen - urinary leakage resolved with fluid management - pending video UDS report

## 2023-11-01 NOTE — Assessment & Plan Note (Signed)
-   For treatment of stress urinary incontinence,  non-surgical options include expectant management, weight loss, physical therapy, as well as a pessary.  Surgical options include a midurethral sling, Burch urethropexy, and transurethral injection of a bulking agent. - pt desires expectant management at this time due to minimal symptoms resolved after behavioral modification with fluid management and Gemtesa  for OAB

## 2023-11-05 ENCOUNTER — Ambulatory Visit: Payer: 59 | Admitting: Physical Therapy

## 2023-11-05 ENCOUNTER — Encounter: Payer: Self-pay | Admitting: Physical Therapy

## 2023-11-05 DIAGNOSIS — M6281 Muscle weakness (generalized): Secondary | ICD-10-CM | POA: Diagnosis present

## 2023-11-05 DIAGNOSIS — R102 Pelvic and perineal pain: Secondary | ICD-10-CM | POA: Diagnosis present

## 2023-11-05 NOTE — Therapy (Signed)
 OUTPATIENT PHYSICAL THERAPY FEMALE PELVIC TREATMENT   Patient Name: Anna Ramsey MRN: [id number] DOB:05-16-1966, 58 y.o., female Today's Date: 11/05/2023  END OF SESSION:  PT End of Session - 11/05/23 0836     Visit Number 10    Date for PT Re-Evaluation 02/03/24    Authorization Type [employer]    PT Start Time 0845    PT Stop Time 0925    PT Time Calculation (min) 40 min    Activity Tolerance Patient tolerated treatment well    Behavior During Therapy [facility] for tasks assessed/performed             Past Medical History:  Diagnosis Date   ADHD (attention deficit hyperactivity disorder) 2024   Dysuria    Erythematous bladder mucosa    Heart murmur    asymptomatic per pt --  no echo   Hematuria    History of cervical cancer    12/ 2014  Stage IB2--  s/p  TAH w/ BSO and pelvic lymphadectomy/  and radiation therapy   History of radiation therapy 10/20/13-11/28/13   pelvis 50.4 [facility]   Hypertension    Lesion of bladder    Mild intermittent asthma    Neuromuscular disorder (HCC)    Seasonal allergies    Thyroid disease    Past Surgical History:  Procedure Laterality Date   CYSTOSCOPY WITH BIOPSY N/A 04/29/2015   Procedure: CYSTOSCOPY WITH BIOPSY WITH FULGERATION;  Surgeon: Marco Severs, MD;  Location[facility];  Service: Urology;  Laterality: N/A;  1 HR [phone] [id number]   FOOT SURGERY Left 2010   RADICAL ABDOMINAL HYSTERECTOMY  09-05-2013   w/ BILATERAL SALPINGOOPHORECTOMY AND PELVIC LYMPHADECTOMY   Patient Active Problem List   Diagnosis Date Noted   SUI (stress urinary incontinence, female) 11/01/2023   History of urinary retention 11/01/2023   Neurogenic bladder 10/18/2023   Hematuria 10/18/2023   Acute urinary retention 10/18/2023   Preventive measure 12/20/2022   Stress and adjustment reaction 01/13/2021   Inattention 10/28/2019   Hypothyroid 11/13/2016   Neuropathy involving both lower extremities 11/13/2016   Cystitis,  radiation 05/22/2014   Cervical cancer (HCC) 09/01/2013   Adenocarcinoma of cervix, 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   Asthma, mild intermittent 07/07/2009   INSOMNIA 05/28/2008   Generalized anxiety disorder 05/07/2008   HYPERTENSION, BENIGN 05/31/2007    PCP: Mickel Alanis, MD  REFERRING PROVIDER: Zuleta, Kaitlin G, NP   REFERRING DIAG:  N32.81 (ICD-10-CM) - OAB (overactive bladder)  N32.89 (ICD-10-CM) - Bladder spasms  N39.3 (ICD-10-CM) - SUI (stress urinary incontinence, female)  N39.41 (ICD-10-CM) - Urge incontinence    THERAPY DIAG:  Muscle weakness (generalized)  Pelvic pain  Rationale for Evaluation and Treatment: Rehabilitation  ONSET DATE: 2015  SUBJECTIVE:  SUBJECTIVE STATEMENT: Last test showed she is emptying her bladder well and filling well. Seems like I have stress incontinence. I have not had urinary leakage since last visit. Last night I got up 3 times. I do not get the signals to urinate. I am on a timed voiding. I am making sure I have time to sit on the commode to urinate. I get the urges for a bowel movement.  Fluid intake: Yes: water     PAIN:  Are you having pain? Yes NPRS scale: 0/10, 10/15/23 Pain location:  lower abdominal  Pain type: aching Pain description: constant   Aggravating factors: walking Relieving factors: try not to walk  PRECAUTIONS: Other: cervical cancer with radiation  RED FLAGS: None   WEIGHT BEARING RESTRICTIONS: No  FALLS:  Has patient fallen in last 6 months? Yes. 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; unable to sit with legs down due to them  falling asleep  PLOF: Independent  PATIENT GOALS: to work on core and pelvic floor                                                                      PERTINENT HISTORY:  ADHD; Cervical Cancer 2014; 9 weeks of radiation; Radical hysterectomy;   BOWEL MOVEMENT: Pain with bowel movement: No Type of bowel movement:Type (Bristol Stool Scale) Type 4, Frequency daily, Strain No, and Splinting no  has to focus on the abdominal muscles to work a certain way for a bowel movement Fully empty rectum: No, always feels like the rectal area is full Leakage: No Fiber supplement: No  URINATION: Pain with urination: No Fully empty bladder: Yes:   Stream: Weak Urgency: No Frequency: every 2-3 hours Leakage: Urge to void, Walking to the bathroom,   Lifting, and Bending forward leaks with all when she waits too long Pads: now wearing regular panties  INTERCOURSE: Pain with intercourse:  only if she does it a certain way Ability to have vaginal penetration:  Yes:   Climax: yes Marinoff Scale: 0/3  PREGNANCY: Vaginal deliveries 1   OBJECTIVE:  Note: Objective measures were completed at Evaluation unless otherwise noted.   COGNITION: Overall cognitive status: Within functional limits for tasks assessed      GAIT: Assistive device utilized: Single point cane Level of assistance: Complete Independence Comments: uses a wheelchair in public  POSTURE: No Significant postural limitations  PELVIC ALIGNMENT:equal   PALPATION:                External Perineal Exam Intact                             Internal Pelvic Floor decreased mobility of the perineal body, tightness along the introitus, levator ani and obturator internist, vaginal canal is shortened; decreased mobility around the bladder  Patient confirms identification and approves PT to assess internal pelvic floor and treatment Yes  PELVIC MMT:   MMT eval 08/20/23 10/08/23  Vaginal 2/5 2/5 with slight lift 2/5 with lots of  tactile  and verbal cues  (Blank rows = not tested)        TONE: average  PROLAPSE: none  TODAY'S TREATMENT:   11/05/23 Exercises: Strengthening: Heel slides with yellow band around foot with abdominal engagement. Therapist gives assistance to reduce right hip adduction Supine hip abduction sliding on mat and yellow band to assist with hip adduction Supine with legs straight  with ball squeeze to engage the hip adductors Bilateral heel slides working the abdominals Bridge with ball squeeze and use hands to reduce hip abduction Quadruped with abdominal contraction Quadruped lift arm with ball squeeze Therapeutic activities: Functional strengthening activities: Discussed with patient timed voiding and her urinating every 2-3 hours due to not getting the urge to void    10/15/23 Exercises: Stretches/mobility: Using strap stretch hamstring holding 30 sec bil.  Using strap stretch lateral hip holding 30 sec bil.  Bilateral knees to chest holding 30 sec.  Piriformis stretch in supine holding 30 bil.  Strengthening:(all exercises engage the core and pelvic floor) Heel slide with yellow band giving gentle hip adductor resistance and guidance for control 10 x bil.  Supine ball squeeze hold 5 sec 10 x  Supine marching with ball squeeze and yellow band around knees 10 x each  Lay on side with squeeze the ball then lift the top leg up with yellow band around thighs 10 x each     10/08/23 Manual: Internal pelvic floor techniques: No emotional/communication barriers or cognitive limitation. Patient is motivated to learn. Patient understands and agrees with treatment goals and plan. PT explains patient will be examined in standing, sitting, and lying down to see how their muscles and joints work. When they are ready, they will be asked to remove their underwear so PT can examine their perineum. The patient is also given the option of providing their own chaperone as one is not provided in our  facility. The patient also has the right and is explained the right to defer or refuse any part of the evaluation or treatment including the internal exam. With the patient's consent, PT will use one gloved finger to gently assess the muscles of the pelvic floor, seeing how well it contracts and relaxes and if there is muscle symmetry. After, the patient will get dressed and PT and patient will discuss exam findings and plan of care. PT and patient discuss plan of care, schedule, attendance policy and HEP activities. Therapist finger in the vaginal canal working on the levator ani, obturator internist, along the sides of the bladder with hands on the suprapubic area externally  Exercises: Strengthening: Supine foot on ball and heel slides with foot on ball with therapist assistance to keep the knee from falling inward Supine with knee bent heel raises keeping control of knee and therapist assisting to prevent going inward Therapist finger in the vaginal canal with giving tactile cues to contract the pelvic floor, hands on the lower abdomen to assist the bladder to lift    PATIENT EDUCATION: 10/15/23 Education details: Access Code: [email or web address], educated patient on moisturizers, educated patient on how to massage her abdomen, educated patient on how to massage the pelvic floor muscles with her vaginal massager Person educated: Patient Education method: Explanation, Demonstration, Tactile cues, Verbal cues, and Handouts Education comprehension: verbalized understanding, returned demonstration, verbal cues required, tactile cues required, and needs further education    HOME EXERCISE PROGRAM: 11/05/23 Access Code: [email or web address] URL: [email or web address] Date: 11/05/2023 Prepared by: Marsha Skeen  Exercises - Supine Diaphragmatic Breathing  - 2  x daily - 7 x weekly - 1 sets - 10 reps - Hooklying Transversus Abdominis Palpation  - 1 x daily - 7 x weekly - 1 sets - 10 reps - Seated Transversus  Abdominis Bracing  - 1 x daily - 7 x weekly - 1 sets - 10 reps - Supine Transversus Abdominis Bracing - Hands on Thighs  - 1 x daily - 7 x weekly - 1 sets - 10 reps - Supine Hip Adduction Isometric with Ball  - 1 x daily - 3 x weekly - 1 sets - 10 reps - 5 sec hold - Supine Heel Slide with Strap  - 1 x daily - 3 x weekly - 1 sets - 10 reps - Supine March with Resistance Band  - 1 x daily - 3 x weekly - 1 sets - 10 reps - Sidelying Hip Adduction Isometric with Ball  - 1 x daily - 3 x weekly - 1 sets - 10 reps - Supine Bridge with Mini Swiss Ball Between Knees  - 1 x daily - 7 x weekly - 3 sets - 10 reps - Supine Straight Leg Hip Adduction and Quad Set with Ball  - 1 x daily - 7 x weekly - 3 sets - 10 reps - Quadruped Exhale with Pelvic Floor Contraction and Arm Raise  - 1 x daily - 7 x weekly - 3 sets - 10 reps  ASSESSMENT:  CLINICAL IMPRESSION: Patient is a 58 y.o. female who was seen today for physical therapy  treatment for Overactive bladder, stress incontinence,bladder spasms. Patient is using timed voiding to reduce the urge to void due to her not getting the feeling of the urge to urinate. She is sitting on the commode longer to relax her pelvic floor to fully empty her bladder. She has not had urinary leakage since last visit. She is working the core and hip muscle to increase pelvic floor strength. Patient would benefit from skilled therapy to work on pelvic floor strength and coordination to reduce her leakage.   OBJECTIVE IMPAIRMENTS: decreased coordination, decreased endurance, decreased strength, increased fascial restrictions, increased muscle spasms, and pain.   ACTIVITY LIMITATIONS: continence and locomotion level  PARTICIPATION LIMITATIONS: community activity and occupation  PERSONAL FACTORS: Time since onset of injury/illness/exacerbation are also affecting patient's functional outcome.   REHAB POTENTIAL: Good  CLINICAL DECISION MAKING: Evolving/moderate  complexity  EVALUATION COMPLEXITY: Moderate   GOALS: Goals reviewed with patient? Yes  SHORT TERM GOALS: Target date: 09/02/23  Patient is independent with manual work to the pelvic floor to lengthen the tissue.  Baseline: Goal status: Met 08/20/23  2.  Patient understands on vaginal moisturizers to improve vaginal health.  Baseline:  Goal status: Met 08/20/23  3.  Patient is able to contract and fully lengthen her pelvic floor.  Baseline:  Goal status: ongoing 10/05/23   LONG TERM GOALS: Target date: 02/03/24  Patient is independent with advanced HEP to increase pelvic floor and core strength.  Baseline:  Goal status: INITIAL  2.  Patient pelvic floor strength is >/= 3/5 so her urinary leakage decreased >/= 60%.  Baseline: leakage improved by 95% Goal status:  ongoing 10/05/23  3.  Patient is able to bend forward with minimal to no urinary leakage due to improved coordination with hip flexion.  Baseline:  Goal status: INITIAL  4.  Patient is able to use the urge to void behavioral technique to delay the urge to give her time to go to the bathroom.  Baseline:  timed voiding.  Goal status: met 11/05/23   PLAN:  PT FREQUENCY: 1x/week  PT DURATION: 6 months  PLANNED INTERVENTIONS: 97110-Therapeutic exercises, 97530- Therapeutic activity, 97112- Neuromuscular re-education, 97140- Manual therapy, 97014- Electrical stimulation (unattended), [phone]- Electrical stimulation (manual), Patient/Family education, Dry Needling, and Biofeedback  PLAN FOR NEXT SESSION: manual work to abdomen or pelvic floor, work on lifting and bending forward, exercise the core  Marsha Skeen, PT 11/05/23 9:28 AM

## 2023-11-05 NOTE — Telephone Encounter (Signed)
Patient was seen on 10/31/2022.

## 2023-11-09 ENCOUNTER — Telehealth (INDEPENDENT_AMBULATORY_CARE_PROVIDER_SITE_OTHER): Payer: Self-pay | Admitting: Obstetrics

## 2023-11-09 DIAGNOSIS — N319 Neuromuscular dysfunction of bladder, unspecified: Secondary | ICD-10-CM

## 2023-11-09 DIAGNOSIS — Z87898 Personal history of other specified conditions: Secondary | ICD-10-CM

## 2023-11-09 MED ORDER — TAMSULOSIN HCL 0.4 MG PO CAPS: 0.4000 mg | ORAL_CAPSULE | Freq: Every day | ORAL | 3 refills | Status: DC

## 2023-11-09 NOTE — Telephone Encounter (Signed)
Review UDS report. Video UDS 11/01/23  Uroflow void 70, Qmax 88mL/s, PVR scant CMG MCC , 1st sens , normal desire , strong desire + SUI, VLPP 34cmH2O Pressure flow void 203, Qmax 7, max Pdet 47, PVR . Synergic EMG. No reflux. Unable to determine compliance due to multiple zeroing during UDS.   Pt denies need for catheter adjustment during UDS. Pt reports concerns of incomplete emptying sensation and need for valsalva voiding or  Discussed need for 3rd line therapy if decreased compliance noted on repeat UDS.  Discussed increased risk of urinary retention and incomplete emptying that will require resumption of CIC.   Rx to start Flomax to reassess bladder emptying. Discussed risk of low blood pressure. Pt to call and stop if she experiences any change in urinary symptoms. Plan for repeat Cr and Renal US.  Patient to resume and record CIC volume for review.

## 2023-11-13 ENCOUNTER — Ambulatory Visit (INDEPENDENT_AMBULATORY_CARE_PROVIDER_SITE_OTHER): Payer: 59 | Admitting: Obstetrics and Gynecology

## 2023-11-13 ENCOUNTER — Other Ambulatory Visit (HOSPITAL_COMMUNITY): Payer: Self-pay

## 2023-11-13 ENCOUNTER — Encounter: Payer: Self-pay | Admitting: Obstetrics and Gynecology

## 2023-11-13 VITALS — BP 128/79 | HR 55

## 2023-11-13 DIAGNOSIS — N3289 Other specified disorders of bladder: Secondary | ICD-10-CM

## 2023-11-13 DIAGNOSIS — N319 Neuromuscular dysfunction of bladder, unspecified: Secondary | ICD-10-CM

## 2023-11-13 DIAGNOSIS — R350 Frequency of micturition: Secondary | ICD-10-CM

## 2023-11-13 DIAGNOSIS — Z87898 Personal history of other specified conditions: Secondary | ICD-10-CM

## 2023-11-13 LAB — POCT URINALYSIS DIPSTICK
Bilirubin, UA: NEGATIVE
Blood, UA: NEGATIVE
Glucose, UA: NEGATIVE
Ketones, UA: NEGATIVE
Leukocytes, UA: NEGATIVE
Nitrite, UA: NEGATIVE
Protein, UA: NEGATIVE
Spec Grav, UA: 1.015 (ref 1.010–1.025)
Urobilinogen, UA: 0.2 U/dL
pH, UA: 5 (ref 5.0–8.0)

## 2023-11-13 NOTE — Progress Notes (Signed)
 [facility] Urodynamics Procedure  Referring Physician: Agapito Games, * Date of Procedure: 11/13/2023  Anna Ramsey is a 58 y.o. female who presents for urodynamic evaluation. Indication(s) for study: neurogenic bladder  Vital Signs: LMP 08/19/2013   Laboratory Results: A catheterized urine specimen revealed:  POC urine:  Lab Results  Component Value Date   COLORU yellow 11/13/2023   CLARITYU clear 11/13/2023   GLUCOSEUR Negative 11/13/2023   BILIRUBINUR negative 11/13/2023   KETONESU negative 11/13/2023   SPECGRAV 1.015 11/13/2023   RBCUR negative 11/13/2023   PHUR 5.0 11/13/2023   PROTEINUR Negative 11/13/2023   UROBILINOGEN 0.2 11/13/2023   LEUKOCYTESUR Negative 11/13/2023     Voiding Diary: Not Done  Procedure Timeout:  The correct patient was verified and the correct procedure was verified. The patient was in the correct position and safety precautions were reviewed based on at the patient's history.  Urodynamic Procedure A 55F dual lumen urodynamics catheter was placed under sterile conditions into the patient's bladder. A 55F catheter was placed into the rectum in order to measure abdominal pressure. EMG patches were placed in the appropriate position.  All connections were confirmed and calibrations/adjusted made. Saline was instilled into the bladder through the dual lumen catheters.  Cough/valsalva pressures were measured periodically during filling.  Patient was allowed to void.  The bladder was then emptied of its residual.  UROFLOW: Revealed a Qmax of 6.7 mL/sec.  She voided 43 mL and had a residual of 10 mL.  It was a prolonged normal pattern and represented normal habits though interpretation limited due to low voided volume.  CMG: This was performed with sterile water in the sitting position at a fill rate of 30 mL/min.    First sensation of fullness was 427 mLs,  First urge was 440 mLs,  Strong urge was 651 mLs and   Capacity was 914 mLs  Stress incontinence was not demonstrated Highest negative CLPP was 122 cmH20 at 414 ml. Highest negative VLPP was 70 cmH20at 414 ml.   Detrusor function was underactive, with no phasic contractions seen.    Compliance:  Normal. End fill detrusor pressure was 6.3cmH20.  Calculated compliance was 139mL/cmH20  UPP: MUCP without barrier reduction was 62 cm of water.    MICTURITION STUDY: Voiding was performed without reduction in the sitting position.  Pdet at Qmax was 17 cm of water.  Qmax was 21 mL/sec.  It was a interrupted pattern.  She voided 874 mL and had a residual of 40 mL.  It was a volitional void, sustained detrusor contraction was present and abdominal straining was present  EMG: This was performed with patches.  She had voluntary contractions, recruitment with fill was present and urethral sphincter was not relaxed with void.  The details of the procedure with the study tracings have been scanned into EPIC.   Urodynamic Impression:  1. Sensation was reduced; capacity was increased 2. Stress Incontinence was not demonstrated at normal pressures; 3. Detrusor Overactivity was not demonstrated. 4. Emptying was dysfunctional with a normal PVR, a sustained detrusor contraction present,  abdominal straining present, dyssynergic urethral sphincter activity on EMG.  Plan: - The patient will follow up  to discuss the findings and treatment options.  -Continue on Gemtesa 75mg  daily and Flomax.  -Patient will cath if she feels she is not emptying.  -Initial review with Dr. Olena Leatherwood done and agrees with the plan of care listed above.

## 2023-11-14 ENCOUNTER — Ambulatory Visit: Payer: 59 | Admitting: Physical Therapy

## 2023-11-14 ENCOUNTER — Encounter: Payer: Self-pay | Admitting: Physical Therapy

## 2023-11-14 DIAGNOSIS — R102 Pelvic and perineal pain: Secondary | ICD-10-CM

## 2023-11-14 DIAGNOSIS — M6281 Muscle weakness (generalized): Secondary | ICD-10-CM

## 2023-11-14 NOTE — Therapy (Signed)
 OUTPATIENT PHYSICAL THERAPY FEMALE PELVIC TREATMENT   Patient Name: Anna Ramsey MRN: [id number] DOB:[birth year], 57 y.o., female Today's Date: 11/14/2023  END OF SESSION:  PT End of Session - 11/14/23 0758     Visit Number 11    Date for PT Re-Evaluation 02/03/24    Authorization Type [employer]    PT Start Time 0800    PT Stop Time 0840    PT Time Calculation (min) 40 min    Activity Tolerance Patient tolerated treatment well    Behavior During Therapy [facility] for tasks assessed/performed             Past Medical History:  Diagnosis Date   ADHD (attention deficit hyperactivity disorder) 2024   Dysuria    Erythematous bladder mucosa    Heart murmur    asymptomatic per pt --  no echo   Hematuria    History of cervical cancer    12/ 2014  Stage IB2--  s/p  TAH w/ BSO and pelvic lymphadectomy/  and radiation therapy   History of radiation therapy 10/20/13-11/28/13   pelvis 50.4 Taaj Hurlbut   Hypertension    Lesion of bladder    Mild intermittent asthma    Neuromuscular disorder (HCC)    Seasonal allergies    Thyroid disease    Past Surgical History:  Procedure Laterality Date   CYSTOSCOPY WITH BIOPSY N/A 04/29/2015   Procedure: CYSTOSCOPY WITH BIOPSY WITH FULGERATION;  Surgeon: Malen Gauze, MD;  Location: [facility];  Service: Urology;  Laterality: N/A;  1 HR [phone] [id number]   FOOT SURGERY Left 2010   RADICAL ABDOMINAL HYSTERECTOMY  09-05-2013   w/ BILATERAL SALPINGOOPHORECTOMY AND PELVIC LYMPHADECTOMY   Patient Active Problem List   Diagnosis Date Noted   SUI (stress urinary incontinence, female) 11/01/2023   History of urinary retention 11/01/2023   Neurogenic bladder 10/18/2023   Hematuria 10/18/2023   Acute urinary retention 10/18/2023   Preventive measure 12/20/2022   Stress and adjustment reaction 01/13/2021   Inattention 10/28/2019   Hypothyroid 11/13/2016   Neuropathy involving both lower extremities 11/13/2016   Cystitis,  radiation 05/22/2014   Cervical cancer (HCC) 09/01/2013   Adenocarcinoma of cervix, 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   Asthma, mild intermittent 07/07/2009   INSOMNIA 05/28/2008   Generalized anxiety disorder 05/07/2008   HYPERTENSION, BENIGN 05/31/2007    PCP: Merrilee Seashore, MD  REFERRING PROVIDER: Selmer Dominion, NP   REFERRING DIAG:  N32.81 (ICD-10-CM) - OAB (overactive bladder)  N32.89 (ICD-10-CM) - Bladder spasms  N39.3 (ICD-10-CM) - SUI (stress urinary incontinence, female)  N39.41 (ICD-10-CM) - Urge incontinence    THERAPY DIAG:  Muscle weakness (generalized)  Pelvic pain  Rationale for Evaluation and Treatment: Rehabilitation  ONSET DATE: 2015  SUBJECTIVE:  SUBJECTIVE STATEMENT:  I have not had urinary leakage since last visit. I am emptying the bladder fine after the urodynamic test yesterday. No lower abdominal pain. Patient is fully emptying her rectum now. Patient did not wake up in the middle of the night. She is sleeping through the night more often. Patient is having 60% less pain with intercourse.  Fluid intake: Yes: water    PAIN:  Are you having pain? Yes NPRS scale: 0/10, 11/14/23 Pain location:  lower abdominal  Pain type: aching Pain description: constant   Aggravating factors: walking Relieving factors: try not to walk  PRECAUTIONS: Other: cervical cancer with radiation  RED FLAGS: None   WEIGHT BEARING RESTRICTIONS: No  FALLS:  Has patient fallen in last 6 months? Yes. 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; unable to sit with legs down due to them falling  asleep  PLOF: Independent  PATIENT GOALS: to work on core and pelvic floor                                                                      PERTINENT HISTORY:  ADHD; Cervical Cancer 2014; 9 weeks of radiation; Radical hysterectomy;   BOWEL MOVEMENT: Pain with bowel movement: No Type of bowel movement:Type (Bristol Stool Scale) Type 4, Frequency daily, Strain No, and Splinting no  has to focus on the abdominal muscles to work a certain way for a bowel movement Fully empty rectum: No, always feels like the rectal area is full 11/14/23 fully empty rectum  Leakage: No Fiber supplement: No  URINATION: Pain with urination: No Fully empty bladder: Yes:   Stream: Weak Urgency: No Frequency: every 2-3 hours Leakage: Urge to void, Walking to the bathroom,   Lifting, and Bending forward leaks with all when she waits too long Pads: now wearing regular panties  INTERCOURSE: Pain with intercourse:  only if she does it a certain way Ability to have vaginal penetration:  Yes:   Climax: yes Marinoff Scale: 0/3  PREGNANCY: Vaginal deliveries 1   OBJECTIVE:  Note: Objective measures were completed at Evaluation unless otherwise noted.   COGNITION: Overall cognitive status: Within functional limits for tasks assessed      GAIT: Assistive device utilized: Single point cane Level of assistance: Complete Independence Comments: uses a wheelchair in public  POSTURE: No Significant postural limitations  PELVIC ALIGNMENT:equal   PALPATION:                External Perineal Exam Intact                             Internal Pelvic Floor decreased mobility of the perineal body, tightness along the introitus, levator ani and obturator internist, vaginal canal is shortened; decreased mobility around the bladder  Patient confirms identification and approves PT to assess internal pelvic floor and treatment Yes  PELVIC MMT:   MMT eval 08/20/23 10/08/23  Vaginal 2/5 2/5 with slight lift  2/5 with lots of tactile and verbal cues  (Blank rows =  not tested)        TONE: average  PROLAPSE: none  TODAY'S TREATMENT:   11/14/23 Neuromuscular re-education: Core facilitation: Supine with feet on ball, ball between knees, and blue band around knees moving ball back and forth with assistance from the therapist to keep the feet on the ball Supine with feet on ball, ball between knees, and blue band around knees moving ball side to side for obliques Supine with feet on ball, ball between knees, and blue band around knees doing bridges to work posterior trunk Supine press ball into trunk with ball squeeze to work the core Supine alternate shoulder flexion with 1 # wt. In each hand and ball squeeze Supine bilateral shoulder horizontal abduction holding 1 # wt.  Heel slides with yellow band around foot with abdominal engagement. Therapist gives assistance to reduce right hip adduction    11/05/23 Exercises: Strengthening: Heel slides with yellow band around foot with abdominal engagement. Therapist gives assistance to reduce right hip adduction Supine hip abduction sliding on mat and yellow band to assist with hip adduction Supine with legs straight  with ball squeeze to engage the hip adductors Bilateral heel slides working the abdominals Bridge with ball squeeze and use hands to reduce hip abduction Quadruped with abdominal contraction Quadruped lift arm with ball squeeze Therapeutic activities: Functional strengthening activities: Discussed with patient timed voiding and her urinating every 2-3 hours due to not getting the urge to void    10/15/23 Exercises: Stretches/mobility: Using strap stretch hamstring holding 30 sec bil.  Using strap stretch lateral hip holding 30 sec bil.  Bilateral knees to chest holding 30 sec.  Piriformis stretch in supine holding 30 bil.  Strengthening:(all exercises engage the core and pelvic floor) Heel slide with yellow band giving gentle  hip adductor resistance and guidance for control 10 x bil.  Supine ball squeeze hold 5 sec 10 x  Supine marching with ball squeeze and yellow band around knees 10 x each  Lay on side with squeeze the ball then lift the top leg up with yellow band around thighs 10 x each     PATIENT EDUCATION: 10/15/23 Education details: Access Code: [email or web address], educated patient on moisturizers, educated patient on how to massage her abdomen, educated patient on how to massage the pelvic floor muscles with her vaginal massager Person educated: Patient Education method: Explanation, Demonstration, Tactile cues, Verbal cues, and Handouts Education comprehension: verbalized understanding, returned demonstration, verbal cues required, tactile cues required, and needs further education    HOME EXERCISE PROGRAM: 11/05/23 Access Code: [email or web address] URL: [email or web address] Date: 11/05/2023 Prepared by: Eulis Foster  Exercises - Supine Diaphragmatic Breathing  - 2 x daily - 7 x weekly - 1 sets - 10 reps - Hooklying Transversus Abdominis Palpation  - 1 x daily - 7 x weekly - 1 sets - 10 reps - Seated Transversus Abdominis Bracing  - 1 x daily - 7 x weekly - 1 sets - 10 reps - Supine Transversus Abdominis Bracing - Hands on Thighs  - 1 x daily - 7 x weekly - 1 sets - 10 reps - Supine Hip Adduction Isometric with Ball  - 1 x daily - 3 x weekly - 1 sets - 10 reps - 5 sec hold - Supine Heel Slide with Strap  - 1 x daily - 3 x weekly - 1 sets - 10 reps - Supine March with Resistance Band  - 1 x daily - 3 x weekly - 1 sets - 10 reps -  Sidelying Hip Adduction Isometric with Ball  - 1 x daily - 3 x weekly - 1 sets - 10 reps - Supine Bridge with Mini Swiss Ball Between Knees  - 1 x daily - 7 x weekly - 3 sets - 10 reps - Supine Straight Leg Hip Adduction and Quad Set with Ball  - 1 x daily - 7 x weekly - 3 sets - 10 reps - Quadruped Exhale with Pelvic Floor Contraction and Arm Raise  - 1 x daily - 7 x weekly - 3  sets - 10 reps  ASSESSMENT:  CLINICAL IMPRESSION: Patient is a 58 y.o. female who was seen today for physical therapy  treatment for Overactive bladder, stress incontinence,bladder spasms. Patient has not leaked urine since last visit. Patient is able to have a bowel movement without straining at this time. Patient had a urodynamic test that showed she is fully emptying her bladder.  Patient is having 60% less pain with intercourse. She is sleeping through the night more often. Patient would benefit from skilled therapy to work on pelvic floor strength and coordination to reduce her leakage.   OBJECTIVE IMPAIRMENTS: decreased coordination, decreased endurance, decreased strength, increased fascial restrictions, increased muscle spasms, and pain.   ACTIVITY LIMITATIONS: continence and locomotion level  PARTICIPATION LIMITATIONS: community activity and occupation  PERSONAL FACTORS: Time since onset of injury/illness/exacerbation are also affecting patient's functional outcome.   REHAB POTENTIAL: Good  CLINICAL DECISION MAKING: Evolving/moderate complexity  EVALUATION COMPLEXITY: Moderate   GOALS: Goals reviewed with patient? Yes  SHORT TERM GOALS: Target date: 09/02/23  Patient is independent with manual work to the pelvic floor to lengthen the tissue.  Baseline: Goal status: Met 08/20/23  2.  Patient understands on vaginal moisturizers to improve vaginal health.  Baseline:  Goal status: Met 08/20/23  3.  Patient is able to contract and fully lengthen her pelvic floor.  Baseline:  Goal status: ongoing 10/05/23   LONG TERM GOALS: Target date: 02/03/24  Patient is independent with advanced HEP to increase pelvic floor and core strength.  Baseline:  Goal status: INITIAL  2.  Patient pelvic floor strength is >/= 3/5 so her urinary leakage decreased >/= 60%.  Baseline: leakage improved by 95% Goal status:  ongoing 10/05/23  3.  Patient is able to bend forward with minimal to no  urinary leakage due to improved coordination with hip flexion.  Baseline:  Goal status: INITIAL  4.  Patient is able to use the urge to void behavioral technique to delay the urge to give her time to go to the bathroom.  Baseline: timed voiding.  Goal status: met 11/05/23   PLAN:  PT FREQUENCY: 1x/week  PT DURATION: 6 months  PLANNED INTERVENTIONS: 97110-Therapeutic exercises, 97530- Therapeutic activity, 97112- Neuromuscular re-education, 97140- Manual therapy, 97014- Electrical stimulation (unattended), [phone]- Electrical stimulation (manual), Patient/Family education, Dry Needling, and Biofeedback  PLAN FOR NEXT SESSION: manual work to abdomen or pelvic floor, work on lifting and bending forward, exercise the core  Eulis Foster, PT 11/14/23 8:43 AM

## 2023-11-15 ENCOUNTER — Other Ambulatory Visit (HOSPITAL_COMMUNITY): Payer: Self-pay

## 2023-11-19 ENCOUNTER — Other Ambulatory Visit: Payer: Self-pay | Admitting: Obstetrics and Gynecology

## 2023-11-19 ENCOUNTER — Other Ambulatory Visit (HOSPITAL_COMMUNITY)
Admission: RE | Admit: 2023-11-19 | Discharge: 2023-11-19 | Disposition: A | Discharge status: Home / Self Care | Payer: Self-pay | Source: Ambulatory Visit | Attending: Medical Genetics | Admitting: Medical Genetics

## 2023-11-19 DIAGNOSIS — R350 Frequency of micturition: Secondary | ICD-10-CM

## 2023-11-19 DIAGNOSIS — N3281 Overactive bladder: Secondary | ICD-10-CM

## 2023-11-19 DIAGNOSIS — N3289 Other specified disorders of bladder: Secondary | ICD-10-CM

## 2023-11-23 ENCOUNTER — Ambulatory Visit: Payer: 59 | Admitting: Physical Therapy

## 2023-11-23 ENCOUNTER — Encounter: Payer: Self-pay | Admitting: Physical Therapy

## 2023-11-23 DIAGNOSIS — R102 Pelvic and perineal pain: Secondary | ICD-10-CM

## 2023-11-23 DIAGNOSIS — M6281 Muscle weakness (generalized): Secondary | ICD-10-CM

## 2023-11-23 NOTE — Therapy (Signed)
 OUTPATIENT PHYSICAL THERAPY FEMALE PELVIC TREATMENT   Patient Name: Anna Ramsey MRN: [id number] DOB:04-Mar-1966, 58 y.o., female Today's Date: 11/23/2023  END OF SESSION:  PT End of Session - 11/23/23 0758     Visit Number 12    Date for PT Re-Evaluation 02/03/24    Authorization Type [employer]    PT Start Time 0800    PT Stop Time 0840    PT Time Calculation (min) 40 min    Activity Tolerance Patient tolerated treatment well    Behavior During Therapy [facility] for tasks assessed/performed             Past Medical History:  Diagnosis Date   ADHD (attention deficit hyperactivity disorder) 2024   Dysuria    Erythematous bladder mucosa    Heart murmur    asymptomatic per pt --  no echo   Hematuria    History of cervical cancer    12/ 2014  Stage IB2--  s/p  TAH w/ BSO and pelvic lymphadectomy/  and radiation therapy   History of radiation therapy 10/20/13-11/28/13   pelvis 50.4 Anand Tejada   Hypertension    Lesion of bladder    Mild intermittent asthma    Neuromuscular disorder (HCC)    Seasonal allergies    Thyroid disease    Past Surgical History:  Procedure Laterality Date   CYSTOSCOPY WITH BIOPSY N/A 04/29/2015   Procedure: CYSTOSCOPY WITH BIOPSY WITH FULGERATION;  Surgeon: Malen Gauze, MD;  Location: [facility];  Service: Urology;  Laterality: N/A;  1 HR [phone] [id number]   FOOT SURGERY Left 2010   RADICAL ABDOMINAL HYSTERECTOMY  09-05-2013   w/ BILATERAL SALPINGOOPHORECTOMY AND PELVIC LYMPHADECTOMY   Patient Active Problem List   Diagnosis Date Noted   SUI (stress urinary incontinence, female) 11/01/2023   History of urinary retention 11/01/2023   Neurogenic bladder 10/18/2023   Hematuria 10/18/2023   Acute urinary retention 10/18/2023   Preventive measure 12/20/2022   Stress and adjustment reaction 01/13/2021   Inattention 10/28/2019   Hypothyroid 11/13/2016   Neuropathy involving both lower extremities 11/13/2016   Cystitis,  radiation 05/22/2014   Cervical cancer (HCC) 09/01/2013   Adenocarcinoma of cervix, 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   Asthma, mild intermittent 07/07/2009   INSOMNIA 05/28/2008   Generalized anxiety disorder 05/07/2008   HYPERTENSION, BENIGN 05/31/2007    PCP: Merrilee Seashore, MD  REFERRING PROVIDER: Selmer Dominion, NP   REFERRING DIAG:  N32.81 (ICD-10-CM) - OAB (overactive bladder)  N32.89 (ICD-10-CM) - Bladder spasms  N39.3 (ICD-10-CM) - SUI (stress urinary incontinence, female)  N39.41 (ICD-10-CM) - Urge incontinence    THERAPY DIAG:  Muscle weakness (generalized)  Pelvic pain  Rationale for Evaluation and Treatment: Rehabilitation  ONSET DATE: 2015  SUBJECTIVE:  SUBJECTIVE STATEMENT: Since I started the new medicine I have been having wetness liked I leaked. My under clothes smell like urine. I am nervous in regular underwear.  I have been changing underwear several times per day now.  I have had some low back pain recently. I feel strong. I have not fallen recently. I do not feel like I need internal work.  Fluid intake: Yes: water    PAIN:  Are you having pain? Yes NPRS scale: 0/10, 11/23/23 Pain location:  lower abdominal  Pain type: aching Pain description: constant   Aggravating factors: walking Relieving factors: try not to walk  PRECAUTIONS: Other: cervical cancer with radiation  RED FLAGS: None   WEIGHT BEARING RESTRICTIONS: No  FALLS:  Has patient fallen in last 6 months? Yes. 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; unable to sit with legs down due to them falling  asleep  PLOF: Independent  PATIENT GOALS: to work on core and pelvic floor                                                                      PERTINENT HISTORY:  ADHD; Cervical Cancer 2014; 9 weeks of radiation; Radical hysterectomy;   BOWEL MOVEMENT: Pain with bowel movement: No Type of bowel movement:Type (Bristol Stool Scale) Type 4, Frequency daily, Strain No, and Splinting no  has to focus on the abdominal muscles to work a certain way for a bowel movement Fully empty rectum: No, always feels like the rectal area is full 11/14/23 fully empty rectum  Leakage: No Fiber supplement: No  URINATION: Pain with urination: No Fully empty bladder: Yes:   Stream: Weak Urgency: No Frequency: every 2-3 hours Leakage: Urge to void, Walking to the bathroom,   Lifting, and Bending forward leaks with all when she waits too long Pads: now wearing regular panties  INTERCOURSE: Pain with intercourse:  only if she does it a certain way Ability to have vaginal penetration:  Yes:   Climax: yes Marinoff Scale: 0/3  PREGNANCY: Vaginal deliveries 1   OBJECTIVE:  Note: Objective measures were completed at Evaluation unless otherwise noted.   COGNITION: Overall cognitive status: Within functional limits for tasks assessed      GAIT: Assistive device utilized: Single point cane Level of assistance: Complete Independence Comments: uses a wheelchair in public  POSTURE: No Significant postural limitations  PELVIC ALIGNMENT:equal   PALPATION:                External Perineal Exam Intact                             Internal Pelvic Floor decreased mobility of the perineal body, tightness along the introitus, levator ani and obturator internist, vaginal canal is shortened; decreased mobility around the bladder  Patient confirms identification and approves PT to assess internal pelvic floor and treatment Yes  PELVIC MMT:   MMT eval 08/20/23 10/08/23  Vaginal 2/5 2/5 with slight lift  2/5 with lots of tactile and verbal  cues  (Blank rows = not tested)        TONE: average  PROLAPSE: none  TODAY'S TREATMENT:   11/23/23 Manual: Myofascial release: Fascial release to the abdomen in different areas of the abdomen to reduce the restrictions and increased softness of the abdomen Exercises: Strengthening: Bridge with ball squeeze with x-light strap around knees and using arms to assist with lift Supine with ball squeeze and x-light strap around knees bringing knees to chest and down with abdominal contraction Supine with ball squeeze and x-light strap around knees bringing knees to side using the obliques to contract and rotated 15 x each way    11/14/23 Neuromuscular re-education: Core facilitation: Supine with feet on ball, ball between knees, and blue band around knees moving ball back and forth with assistance from the therapist to keep the feet on the ball Supine with feet on ball, ball between knees, and blue band around knees moving ball side to side for obliques Supine with feet on ball, ball between knees, and blue band around knees doing bridges to work posterior trunk Supine press ball into trunk with ball squeeze to work the core Supine alternate shoulder flexion with 1 # wt. In each hand and ball squeeze Supine bilateral shoulder horizontal abduction holding 1 # wt.  Heel slides with yellow band around foot with abdominal engagement. Therapist gives assistance to reduce right hip adduction    11/05/23 Exercises: Strengthening: Heel slides with yellow band around foot with abdominal engagement. Therapist gives assistance to reduce right hip adduction Supine hip abduction sliding on mat and yellow band to assist with hip adduction Supine with legs straight  with ball squeeze to engage the hip adductors Bilateral heel slides working the abdominals Bridge with ball squeeze and use hands to reduce hip abduction Quadruped with abdominal contraction Quadruped  lift arm with ball squeeze Therapeutic activities: Functional strengthening activities: Discussed with patient timed voiding and her urinating every 2-3 hours due to not getting the urge to void    10/15/23 Exercises: Stretches/mobility: Using strap stretch hamstring holding 30 sec bil.  Using strap stretch lateral hip holding 30 sec bil.  Bilateral knees to chest holding 30 sec.  Piriformis stretch in supine holding 30 bil.  Strengthening:(all exercises engage the core and pelvic floor) Heel slide with yellow band giving gentle hip adductor resistance and guidance for control 10 x bil.  Supine ball squeeze hold 5 sec 10 x  Supine marching with ball squeeze and yellow band around knees 10 x each  Lay on side with squeeze the ball then lift the top leg up with yellow band around thighs 10 x each     PATIENT EDUCATION: 10/15/23 Education details: Access Code: [email or web address], educated patient on moisturizers, educated patient on how to massage her abdomen, educated patient on how to massage the pelvic floor muscles with her vaginal massager Person educated: Patient Education method: Explanation, Demonstration, Tactile cues, Verbal cues, and Handouts Education comprehension: verbalized understanding, returned demonstration, verbal cues required, tactile cues required, and needs further education    HOME EXERCISE PROGRAM: 11/05/23 Access Code: [email or web address] URL: [email or web address] Date: 11/05/2023 Prepared by: Eulis Foster  Exercises - Supine Diaphragmatic Breathing  - 2 x daily - 7 x weekly - 1 sets - 10 reps - Hooklying Transversus Abdominis Palpation  - 1 x daily - 7 x weekly - 1 sets - 10 reps - Seated Transversus Abdominis Bracing  - 1 x daily - 7 x weekly - 1 sets - 10 reps -  Supine Transversus Abdominis Bracing - Hands on Thighs  - 1 x daily - 7 x weekly - 1 sets - 10 reps - Supine Hip Adduction Isometric with Ball  - 1 x daily - 3 x weekly - 1 sets - 10 reps - 5 sec  hold - Supine Heel Slide with Strap  - 1 x daily - 3 x weekly - 1 sets - 10 reps - Supine March with Resistance Band  - 1 x daily - 3 x weekly - 1 sets - 10 reps - Sidelying Hip Adduction Isometric with Ball  - 1 x daily - 3 x weekly - 1 sets - 10 reps - Supine Bridge with Mini Swiss Ball Between Knees  - 1 x daily - 7 x weekly - 3 sets - 10 reps - Supine Straight Leg Hip Adduction and Quad Set with Ball  - 1 x daily - 7 x weekly - 3 sets - 10 reps - Quadruped Exhale with Pelvic Floor Contraction and Arm Raise  - 1 x daily - 7 x weekly - 3 sets - 10 reps  ASSESSMENT:  CLINICAL IMPRESSION: Patient is a 58 y.o. female who was seen today for physical therapy  treatment for Overactive bladder, stress incontinence,bladder spasms.  Patient reports that since she is on the new medication she is having increased urinary leakage so she is having to change her underwear more often. Patient feels her core is stronger. Patient is able to adduct her hips in supine now and rotate her hip externally in sidelying. Patient would benefit from skilled therapy to work on pelvic floor strength and coordination to reduce her leakage.   OBJECTIVE IMPAIRMENTS: decreased coordination, decreased endurance, decreased strength, increased fascial restrictions, increased muscle spasms, and pain.   ACTIVITY LIMITATIONS: continence and locomotion level  PARTICIPATION LIMITATIONS: community activity and occupation  PERSONAL FACTORS: Time since onset of injury/illness/exacerbation are also affecting patient's functional outcome.   REHAB POTENTIAL: Good  CLINICAL DECISION MAKING: Evolving/moderate complexity  EVALUATION COMPLEXITY: Moderate   GOALS: Goals reviewed with patient? Yes  SHORT TERM GOALS: Target date: 09/02/23  Patient is independent with manual work to the pelvic floor to lengthen the tissue.  Baseline: Goal status: Met 08/20/23  2.  Patient understands on vaginal moisturizers to improve vaginal  health.  Baseline:  Goal status: Met 08/20/23  3.  Patient is able to contract and fully lengthen her pelvic floor.  Baseline:  Goal status: ongoing 10/05/23   LONG TERM GOALS: Target date: 02/03/24  Patient is independent with advanced HEP to increase pelvic floor and core strength.  Baseline:  Goal status: INITIAL  2.  Patient pelvic floor strength is >/= 3/5 so her urinary leakage decreased >/= 60%.  Baseline: leakage improved by 95% Goal status:  ongoing 10/05/23  3.  Patient is able to bend forward with minimal to no urinary leakage due to improved coordination with hip flexion.  Baseline:  Goal status: INITIAL  4.  Patient is able to use the urge to void behavioral technique to delay the urge to give her time to go to the bathroom.  Baseline: timed voiding.  Goal status: met 11/05/23   PLAN:  PT FREQUENCY: 1x/week  PT DURATION: 6 months  PLANNED INTERVENTIONS: 97110-Therapeutic exercises, 97530- Therapeutic activity, [id number]- Neuromuscular re-education, 97140- Manual therapy, 97014- Electrical stimulation (unattended), 97032- Electrical stimulation (manual), Patient/Family education, Dry Needling, and Biofeedback  PLAN FOR NEXT SESSION: manual work to abdomen or pelvic floor, work on lifting and bending forward,  exercise the core  Eulis Foster, PT 11/23/23 8:44 AM

## 2023-11-28 ENCOUNTER — Encounter: Payer: Self-pay | Admitting: Physical Therapy

## 2023-11-28 ENCOUNTER — Ambulatory Visit: Payer: Self-pay | Attending: Obstetrics and Gynecology | Admitting: Physical Therapy

## 2023-11-28 DIAGNOSIS — M6281 Muscle weakness (generalized): Secondary | ICD-10-CM | POA: Diagnosis present

## 2023-11-28 DIAGNOSIS — R102 Pelvic and perineal pain: Secondary | ICD-10-CM | POA: Insufficient documentation

## 2023-11-28 NOTE — Therapy (Signed)
 OUTPATIENT PHYSICAL THERAPY FEMALE PELVIC TREATMENT   Patient Name: Anna Ramsey MRN: [id number] DOB:[birth year], 58 y.o., female Today's Date: 11/28/2023  END OF SESSION:  PT End of Session - 11/28/23 1230     Visit Number 13    Date for PT Re-Evaluation 02/03/24    Authorization Type [employer]    PT Start Time 1230    PT Stop Time 1310    PT Time Calculation (min) 40 min    Activity Tolerance Patient tolerated treatment well    Behavior During Therapy [facility] for tasks assessed/performed             Past Medical History:  Diagnosis Date   ADHD (attention deficit hyperactivity disorder) 2024   Dysuria    Erythematous bladder mucosa    Heart murmur    asymptomatic per pt --  no echo   Hematuria    History of cervical cancer    12/ 2014  Stage IB2--  s/p  TAH w/ BSO and pelvic lymphadectomy/  and radiation therapy   History of radiation therapy 10/20/13-11/28/13   pelvis 50.4 Timica Marcom   Hypertension    Lesion of bladder    Mild intermittent asthma    Neuromuscular disorder (HCC)    Seasonal allergies    Thyroid disease    Past Surgical History:  Procedure Laterality Date   CYSTOSCOPY WITH BIOPSY N/A 04/29/2015   Procedure: CYSTOSCOPY WITH BIOPSY WITH FULGERATION;  Surgeon: Malen Gauze, MD;  Location: [facility];  Service: Urology;  Laterality: N/A;  1 HR [phone] [id number]   FOOT SURGERY Left 2010   RADICAL ABDOMINAL HYSTERECTOMY  09-05-2013   w/ BILATERAL SALPINGOOPHORECTOMY AND PELVIC LYMPHADECTOMY   Patient Active Problem List   Diagnosis Date Noted   SUI (stress urinary incontinence, female) 11/01/2023   History of urinary retention 11/01/2023   Neurogenic bladder 10/18/2023   Hematuria 10/18/2023   Acute urinary retention 10/18/2023   Preventive measure 12/20/2022   Stress and adjustment reaction 01/13/2021   Inattention 10/28/2019   Hypothyroid 11/13/2016   Neuropathy involving both lower extremities 11/13/2016   Cystitis,  radiation 05/22/2014   Cervical cancer (HCC) 09/01/2013   Adenocarcinoma of cervix, 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   Asthma, mild intermittent 07/07/2009   INSOMNIA 05/28/2008   Generalized anxiety disorder 05/07/2008   HYPERTENSION, BENIGN 05/31/2007    PCP: Merrilee Seashore, MD  REFERRING PROVIDER: Selmer Dominion, NP   REFERRING DIAG:  N32.81 (ICD-10-CM) - OAB (overactive bladder)  N32.89 (ICD-10-CM) - Bladder spasms  N39.3 (ICD-10-CM) - SUI (stress urinary incontinence, female)  N39.41 (ICD-10-CM) - Urge incontinence    THERAPY DIAG:  Muscle weakness (generalized)  Pelvic pain  Rationale for Evaluation and Treatment: Rehabilitation  ONSET DATE: 2015  SUBJECTIVE:  SUBJECTIVE STATEMENT:  Since I started the new medicine I have been having wetness liked I leaked. My under clothes smell like urine. I am nervous in regular underwear.  I have been changing underwear several times per day now.  I have had some low back pain recently. I feel strong. I have not fallen recently. I do not feel like I need internal work.  Fluid intake: Yes: water    PAIN:  Are you having pain? Yes NPRS scale: 0/10, 11/23/23 Pain location:  lower abdominal  Pain type: aching Pain description: constant   Aggravating factors: walking Relieving factors: try not to walk  PRECAUTIONS: Other: cervical cancer with radiation  RED FLAGS: None   WEIGHT BEARING RESTRICTIONS: No  FALLS:  Has patient fallen in last 6 months? Yes. 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; unable to sit with legs down due to them falling  asleep  PLOF: Independent  PATIENT GOALS: to work on core and pelvic floor                                                                      PERTINENT HISTORY:  ADHD; Cervical Cancer 2014; 9 weeks of radiation; Radical hysterectomy;   BOWEL MOVEMENT: Pain with bowel movement: No Type of bowel movement:Type (Bristol Stool Scale) Type 4, Frequency daily, Strain No, and Splinting no  has to focus on the abdominal muscles to work a certain way for a bowel movement Fully empty rectum: No, always feels like the rectal area is full 11/14/23 fully empty rectum  Leakage: No Fiber supplement: No  URINATION: Pain with urination: No Fully empty bladder: Yes:   Stream: Weak Urgency: No Frequency: every 2-3 hours Leakage: Urge to void, Walking to the bathroom,   Lifting, and Bending forward leaks with all when she waits too long Pads: now wearing regular panties  INTERCOURSE: Pain with intercourse:  only if she does it a certain way Ability to have vaginal penetration:  Yes:   Climax: yes Marinoff Scale: 0/3  PREGNANCY: Vaginal deliveries 1   OBJECTIVE:  Note: Objective measures were completed at Evaluation unless otherwise noted.   COGNITION: Overall cognitive status: Within functional limits for tasks assessed      GAIT: Assistive device utilized: Single point cane Level of assistance: Complete Independence Comments: uses a wheelchair in public  POSTURE: No Significant postural limitations  PELVIC ALIGNMENT:equal   PALPATION:                External Perineal Exam Intact                             Internal Pelvic Floor decreased mobility of the perineal body, tightness along the introitus, levator ani and obturator internist, vaginal canal is shortened; decreased mobility around the bladder  Patient confirms identification and approves PT to assess internal pelvic floor and treatment Yes  PELVIC MMT:   MMT eval 08/20/23 10/08/23  Vaginal 2/5 2/5 with slight lift  2/5 with lots of tactile and  verbal cues  (Blank rows = not tested)        TONE: average  PROLAPSE: none  TODAY'S TREATMENT:   11/28/23 Exercises: Strengthening: Nustep 5 minutes level 1 with therapist keeping knees apart while assessing patient Heel slide with slider and assistance to prevent knees from going inward Supine hip abduction/adduction with slider and some assistance from therapist Prone hip extension with therapist guiding her toes onto the mat Prone on elbows to increase lumbar extension Quadruped contract the upper and lower abdomen keeping spinal neutral then weight shift from side to side then forward and back Sitting with ball squeeze and press hand into mat to engage the core and pelvic floor Sit to stand with hands in the groin and min assistance to keep trunk upright and not go forward    11/23/23 Manual: Myofascial release: Fascial release to the abdomen in different areas of the abdomen to reduce the restrictions and increased softness of the abdomen Exercises: Strengthening: Bridge with ball squeeze with x-light strap around knees and using arms to assist with lift Supine with ball squeeze and x-light strap around knees bringing knees to chest and down with abdominal contraction Supine with ball squeeze and x-light strap around knees bringing knees to side using the obliques to contract and rotated 15 x each way    11/14/23 Neuromuscular re-education: Core facilitation: Supine with feet on ball, ball between knees, and blue band around knees moving ball back and forth with assistance from the therapist to keep the feet on the ball Supine with feet on ball, ball between knees, and blue band around knees moving ball side to side for obliques Supine with feet on ball, ball between knees, and blue band around knees doing bridges to work posterior trunk Supine press ball into trunk with ball squeeze to work the core Supine alternate shoulder flexion with 1 #  wt. In each hand and ball squeeze Supine bilateral shoulder horizontal abduction holding 1 # wt.  Heel slides with yellow band around foot with abdominal engagement. Therapist gives assistance to reduce right hip adduction    11/05/23 Exercises: Strengthening: Heel slides with yellow band around foot with abdominal engagement. Therapist gives assistance to reduce right hip adduction Supine hip abduction sliding on mat and yellow band to assist with hip adduction Supine with legs straight  with ball squeeze to engage the hip adductors Bilateral heel slides working the abdominals Bridge with ball squeeze and use hands to reduce hip abduction Quadruped with abdominal contraction Quadruped lift arm with ball squeeze Therapeutic activities: Functional strengthening activities: Discussed with patient timed voiding and her urinating every 2-3 hours due to not getting the urge to void    10/15/23 Exercises: Stretches/mobility: Using strap stretch hamstring holding 30 sec bil.  Using strap stretch lateral hip holding 30 sec bil.  Bilateral knees to chest holding 30 sec.  Piriformis stretch in supine holding 30 bil.  Strengthening:(all exercises engage the core and pelvic floor) Heel slide with yellow band giving gentle hip adductor resistance and guidance for control 10 x bil.  Supine ball squeeze hold 5 sec 10 x  Supine marching with ball squeeze and yellow band around knees 10 x each  Lay on side with squeeze the ball then lift the top leg up with yellow band around thighs 10 x each     PATIENT EDUCATION: 10/15/23 Education details: Access Code: [email or web address], educated patient on moisturizers, educated patient on how to massage her abdomen, educated patient on how to massage  the pelvic floor muscles with her vaginal massager Person educated: Patient Education method: Explanation, Demonstration, Tactile cues, Verbal cues, and Handouts Education comprehension: verbalized understanding, returned  demonstration, verbal cues required, tactile cues required, and needs further education    HOME EXERCISE PROGRAM: 11/05/23 Access Code: [email or web address] URL: [email or web address] Date: 11/05/2023 Prepared by: Eulis Foster  Exercises - Supine Diaphragmatic Breathing  - 2 x daily - 7 x weekly - 1 sets - 10 reps - Hooklying Transversus Abdominis Palpation  - 1 x daily - 7 x weekly - 1 sets - 10 reps - Seated Transversus Abdominis Bracing  - 1 x daily - 7 x weekly - 1 sets - 10 reps - Supine Transversus Abdominis Bracing - Hands on Thighs  - 1 x daily - 7 x weekly - 1 sets - 10 reps - Supine Hip Adduction Isometric with Ball  - 1 x daily - 3 x weekly - 1 sets - 10 reps - 5 sec hold - Supine Heel Slide with Strap  - 1 x daily - 3 x weekly - 1 sets - 10 reps - Supine March with Resistance Band  - 1 x daily - 3 x weekly - 1 sets - 10 reps - Sidelying Hip Adduction Isometric with Ball  - 1 x daily - 3 x weekly - 1 sets - 10 reps - Supine Bridge with Mini Swiss Ball Between Knees  - 1 x daily - 7 x weekly - 3 sets - 10 reps - Supine Straight Leg Hip Adduction and Quad Set with Ball  - 1 x daily - 7 x weekly - 3 sets - 10 reps - Quadruped Exhale with Pelvic Floor Contraction and Arm Raise  - 1 x daily - 7 x weekly - 3 sets - 10 reps  ASSESSMENT:  CLINICAL IMPRESSION: Patient is a 58 y.o. female who was seen today for physical therapy  treatment for Overactive bladder, stress incontinence,bladder spasms.  Patient is working on core and hip strength to work on her pelvic floor strength. She needs assistance with sit to stand without hands. She is not able to extend her hip in prone unless her feet are on the mat and tactile cues. She still leaks with bending forward.  Patient would benefit from skilled therapy to work on pelvic floor strength and coordination to reduce her leakage.   OBJECTIVE IMPAIRMENTS: decreased coordination, decreased endurance, decreased strength, increased fascial  restrictions, increased muscle spasms, and pain.   ACTIVITY LIMITATIONS: continence and locomotion level  PARTICIPATION LIMITATIONS: community activity and occupation  PERSONAL FACTORS: Time since onset of injury/illness/exacerbation are also affecting patient's functional outcome.   REHAB POTENTIAL: Good  CLINICAL DECISION MAKING: Evolving/moderate complexity  EVALUATION COMPLEXITY: Moderate   GOALS: Goals reviewed with patient? Yes  SHORT TERM GOALS: Target date: 09/02/23  Patient is independent with manual work to the pelvic floor to lengthen the tissue.  Baseline: Goal status: Met 08/20/23  2.  Patient understands on vaginal moisturizers to improve vaginal health.  Baseline:  Goal status: Met 08/20/23  3.  Patient is able to contract and fully lengthen her pelvic floor.  Baseline:  Goal status: ongoing 10/05/23   LONG TERM GOALS: Target date: 02/03/24  Patient is independent with advanced HEP to increase pelvic floor and core strength.  Baseline:  Goal status: INITIAL  2.  Patient pelvic floor strength is >/= 3/5 so her urinary leakage decreased >/= 60%.  Baseline: leakage improved by 95% Goal status:  ongoing 10/05/23  3.  Patient is able to bend forward with minimal to no urinary leakage due to improved coordination with hip flexion.  Baseline:  Goal status: INITIAL  4.  Patient is able to use the urge to void behavioral technique to delay the urge to give her time to go to the bathroom.  Baseline: timed voiding.  Goal status: met 11/05/23   PLAN:  PT FREQUENCY: 1x/week  PT DURATION: 6 months  PLANNED INTERVENTIONS: 97110-Therapeutic exercises, 97530- Therapeutic activity, 97112- Neuromuscular re-education, 97140- Manual therapy, 97014- Electrical stimulation (unattended), [id number]- Electrical stimulation (manual), Patient/Family education, Dry Needling, and Biofeedback  PLAN FOR NEXT SESSION: manual work to abdomen or pelvic floor, work on lifting and  bending forward, exercise the core  Eulis Foster, PT 11/28/23 1:15 PM

## 2023-12-04 LAB — GENECONNECT MOLECULAR SCREEN: Genetic Analysis Overall Interpretation: NEGATIVE

## 2023-12-20 ENCOUNTER — Other Ambulatory Visit: Payer: Self-pay | Admitting: Obstetrics and Gynecology

## 2023-12-20 ENCOUNTER — Other Ambulatory Visit: Payer: Self-pay

## 2023-12-20 DIAGNOSIS — R350 Frequency of micturition: Secondary | ICD-10-CM

## 2023-12-20 DIAGNOSIS — N3281 Overactive bladder: Secondary | ICD-10-CM

## 2023-12-20 DIAGNOSIS — N3289 Other specified disorders of bladder: Secondary | ICD-10-CM

## 2023-12-20 MED ORDER — GEMTESA 75 MG PO TABS: 1.0000 | ORAL_TABLET | Freq: Every day | ORAL | 3 refills | Status: DC

## 2023-12-21 ENCOUNTER — Encounter: Payer: Self-pay | Admitting: Physical Therapy

## 2023-12-21 ENCOUNTER — Ambulatory Visit: Payer: Self-pay | Admitting: Physical Therapy

## 2023-12-21 DIAGNOSIS — M6281 Muscle weakness (generalized): Secondary | ICD-10-CM

## 2023-12-21 DIAGNOSIS — R102 Pelvic and perineal pain unspecified side: Secondary | ICD-10-CM

## 2023-12-21 NOTE — Therapy (Signed)
 OUTPATIENT PHYSICAL THERAPY FEMALE PELVIC TREATMENT   Patient Name: Anna Ramsey MRN: [id number] DOB:04-16-1966, 58 y.o., female Today's Date: 12/21/2023  END OF SESSION:  PT End of Session - 12/21/23 0845     Visit Number 14    Date for PT Re-Evaluation 02/03/24    Authorization Type [employer]    PT Start Time 0845    PT Stop Time 0925    PT Time Calculation (min) 40 min    Activity Tolerance Patient tolerated treatment well    Behavior During Therapy [facility] for tasks assessed/performed             Past Medical History:  Diagnosis Date   ADHD (attention deficit hyperactivity disorder) 2024   Dysuria    Erythematous bladder mucosa    Heart murmur    asymptomatic per pt --  no echo   Hematuria    History of cervical cancer    12/ 2014  Stage IB2--  s/p  TAH w/ BSO and pelvic lymphadectomy/  and radiation therapy   History of radiation therapy 10/20/13-11/28/13   pelvis 50.4 Zayne Marovich   Hypertension    Lesion of bladder    Mild intermittent asthma    Neuromuscular disorder (HCC)    Seasonal allergies    Thyroid disease    Past Surgical History:  Procedure Laterality Date   CYSTOSCOPY WITH BIOPSY N/A 04/29/2015   Procedure: CYSTOSCOPY WITH BIOPSY WITH FULGERATION;  Surgeon: Malen Gauze, MD;  Location: [facility];  Service: Urology;  Laterality: N/A;  1 HR [phone] [id number]   FOOT SURGERY Left 2010   RADICAL ABDOMINAL HYSTERECTOMY  09-05-2013   w/ BILATERAL SALPINGOOPHORECTOMY AND PELVIC LYMPHADECTOMY   Patient Active Problem List   Diagnosis Date Noted   SUI (stress urinary incontinence, female) 11/01/2023   History of urinary retention 11/01/2023   Neurogenic bladder 10/18/2023   Hematuria 10/18/2023   Acute urinary retention 10/18/2023   Preventive measure 12/20/2022   Stress and adjustment reaction 01/13/2021   Inattention 10/28/2019   Hypothyroid 11/13/2016   Neuropathy involving both lower extremities 11/13/2016   Cystitis,  radiation 05/22/2014   Cervical cancer (HCC) 09/01/2013   Adenocarcinoma of cervix, 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   Asthma, mild intermittent 07/07/2009   INSOMNIA 05/28/2008   Generalized anxiety disorder 05/07/2008   HYPERTENSION, BENIGN 05/31/2007    PCP: Merrilee Seashore, MD  REFERRING PROVIDER: Selmer Dominion, NP   REFERRING DIAG:  N32.81 (ICD-10-CM) - OAB (overactive bladder)  N32.89 (ICD-10-CM) - Bladder spasms  N39.3 (ICD-10-CM) - SUI (stress urinary incontinence, female)  N39.41 (ICD-10-CM) - Urge incontinence    THERAPY DIAG:  Muscle weakness (generalized)  Pelvic pain  Rationale for Evaluation and Treatment: Rehabilitation  ONSET DATE: 2015  SUBJECTIVE:  SUBJECTIVE STATEMENT: No pelvic pain or urinary leakage.   Fluid intake: Yes: water    PAIN:  Are you having pain? Yes NPRS scale: 0/10, 11/23/23 Pain location:  lower abdominal  Pain type: aching Pain description: constant   Aggravating factors: walking Relieving factors: try not to walk  PRECAUTIONS: Other: cervical cancer with radiation  RED FLAGS: None   WEIGHT BEARING RESTRICTIONS: No  FALLS:  Has patient fallen in last 6 months? Yes. 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; unable to sit with legs down due to them falling asleep  PLOF: Independent  PATIENT GOALS: to work on core and pelvic floor                                                                      PERTINENT HISTORY:  ADHD; Cervical Cancer 2014; 9 weeks of radiation; Radical hysterectomy;   BOWEL MOVEMENT: Pain with bowel movement: No Type of  bowel movement:Type (Bristol Stool Scale) Type 4, Frequency daily, Strain No, and Splinting no  has to focus on the abdominal muscles to work a certain way for a bowel movement Fully empty rectum: No, always feels like the rectal area is full 11/14/23 fully empty rectum  Leakage: No Fiber supplement: No  URINATION: Pain with urination: No Fully empty bladder: Yes:   Stream: Weak Urgency: No Frequency: every 2-3 hours Leakage: Urge to void, Walking to the bathroom,   Lifting, and Bending forward leaks with all when she waits too long Pads: now wearing regular panties  INTERCOURSE: Pain with intercourse:  only if she does it a certain way Ability to have vaginal penetration:  Yes:   Climax: yes Marinoff Scale: 0/3  PREGNANCY: Vaginal deliveries 1   OBJECTIVE:  Note: Objective measures were completed at Evaluation unless otherwise noted.   COGNITION: Overall cognitive status: Within functional limits for tasks assessed      GAIT: Assistive device utilized: Single point cane Level of assistance: Complete Independence Comments: uses a wheelchair in public  POSTURE: No Significant postural limitations  PELVIC ALIGNMENT:equal   PALPATION:                External Perineal Exam Intact                             Internal Pelvic Floor decreased mobility of the perineal body, tightness along the introitus, levator ani and obturator internist, vaginal canal is shortened; decreased mobility around the bladder  Patient confirms identification and approves PT to assess internal pelvic floor and treatment Yes  PELVIC MMT:   MMT eval 08/20/23 10/08/23  Vaginal 2/5 2/5 with slight lift 2/5 with lots of tactile and verbal cues  (Blank rows = not tested)        TONE: average  PROLAPSE: none  TODAY'S TREATMENT:   12/21/23 Right ankle P/ROM of plantarflexion 0 degrees Manual: Mobilization: Ankle mobilization to increase right ankle dorsiflexion grade 3 Neuromuscular  re-education: Pelvic floor contraction training: Sitting toe scrunches with pelvic floor contraction  Sitting rolling ball under foot to relax the pelvic floor, increase ankle dorsiflexion, and increase sensation in the right foot Exercises: Stretches/mobility: Ankle gastroc stretch holding 30 sec 3 times bil.  Sitting keeping feet flat and push foot flat to increase ankle plantarflexion Strengthening: Nustep 10 minutes level 5 with therapist keeping knees apart while assessing patient Heel slide with slider and assistance to prevent knees from going inward    11/28/23 Exercises: Strengthening: Nustep 5 minutes level 1 with therapist keeping knees apart while assessing patient Heel slide with slider and assistance to prevent knees from going inward Supine hip abduction/adduction with slider and some assistance from therapist Prone hip extension with therapist guiding her toes onto the mat Prone on elbows to increase lumbar extension Quadruped contract the upper and lower abdomen keeping spinal neutral then weight shift from side to side then forward and back Sitting with ball squeeze and press hand into mat to engage the core and pelvic floor Sit to stand with hands in the groin and min assistance to keep trunk upright and not go forward    11/23/23 Manual: Myofascial release: Fascial release to the abdomen in different areas of the abdomen to reduce the restrictions and increased softness of the abdomen Exercises: Strengthening: Bridge with ball squeeze with x-light strap around knees and using arms to assist with lift Supine with ball squeeze and x-light strap around knees bringing knees to chest and down with abdominal contraction Supine with ball squeeze and x-light strap around knees bringing knees to side using the obliques to contract and rotated 15 x each way       PATIENT EDUCATION: 12/21/23 Education details: Access Code: [email or web address], educated patient on moisturizers, educated  patient on how to massage her abdomen, educated patient on how to massage the pelvic floor muscles with her vaginal massager Person educated: Patient Education method: Explanation, Demonstration, Tactile cues, Verbal cues, and Handouts Education comprehension: verbalized understanding, returned demonstration, verbal cues required, tactile cues required, and needs further education    HOME EXERCISE PROGRAM: 12/21/23 Access Code: [email or web address] URL: [email or web address] Date: 11/05/2023 Prepared by: Eulis Foster  Exercises -Access Code: [email or web address] URL: [email or web address] Date: 12/21/2023 Prepared by: Eulis Foster  Exercises - - Seated Calf Towel Stretch  - 1 x daily - 7 x weekly - 1 sets - 3 reps - 30 sec hold - Seated Toe Towel Scrunches  - 1 x daily - 7 x weekly - 1 sets - 10 reps  ASSESSMENT:  CLINICAL IMPRESSION: Patient is a 58 y.o. female who was seen today for physical therapy  treatment for Overactive bladder, stress incontinence,bladder spasms.  Patient has decreased right ankle plantarflexion making it difficult for heel strike. The tightness in the right foot can increase the tightness in the pelvic floor. She has not leaked urine or had pain since last visit. She is working on core, pelvic floor and lower extremity strength to assist with continence.   Patient would benefit from skilled therapy to work on pelvic floor strength and coordination to reduce her leakage.   OBJECTIVE IMPAIRMENTS: decreased coordination, decreased endurance, decreased strength, increased fascial restrictions, increased muscle spasms, and pain.   ACTIVITY LIMITATIONS: continence and locomotion level  PARTICIPATION LIMITATIONS: community activity and occupation  PERSONAL FACTORS: Time since onset of injury/illness/exacerbation are also affecting patient's functional outcome.   REHAB POTENTIAL: Good  CLINICAL DECISION MAKING: Evolving/moderate complexity  EVALUATION  COMPLEXITY: Moderate   GOALS: Goals reviewed with patient? Yes  SHORT TERM GOALS: Target date: 09/02/23  Patient  is independent with manual work to the pelvic floor to lengthen the tissue.  Baseline: Goal status: Met 08/20/23  2.  Patient understands on vaginal moisturizers to improve vaginal health.  Baseline:  Goal status: Met 08/20/23  3.  Patient is able to contract and fully lengthen her pelvic floor.  Baseline:  Goal status: ongoing 10/05/23   LONG TERM GOALS: Target date: 02/03/24  Patient is independent with advanced HEP to increase pelvic floor and core strength.  Baseline:  Goal status: ongoing 12/21/23  2.  Patient pelvic floor strength is >/= 3/5 so her urinary leakage decreased >/= 60%.  Baseline: leakage improved by 95% Goal status:  ongoing 10/05/23  3.  Patient is able to bend forward with minimal to no urinary leakage due to improved coordination with hip flexion.  Baseline:  Goal status: ongoing 12/21/23  4.  Patient is able to use the urge to void behavioral technique to delay the urge to give her time to go to the bathroom.  Baseline: timed voiding.  Goal status: met 11/05/23   PLAN:  PT FREQUENCY: 1x/week  PT DURATION: 6 months  PLANNED INTERVENTIONS: 97110-Therapeutic exercises, 97530- Therapeutic activity, [id number]- Neuromuscular re-education, 97140- Manual therapy, 97014- Electrical stimulation (unattended), [phone]- Electrical stimulation (manual), Patient/Family education, Dry Needling, and Biofeedback  PLAN FOR NEXT SESSION:  work on lifting and bending forward, exercise the core and lower extremity  Eulis Foster, PT 12/21/23 9:32 AM

## 2023-12-23 ENCOUNTER — Other Ambulatory Visit: Payer: Self-pay | Admitting: Family Medicine

## 2023-12-23 DIAGNOSIS — J4520 Mild intermittent asthma, uncomplicated: Secondary | ICD-10-CM

## 2023-12-25 ENCOUNTER — Other Ambulatory Visit: Payer: Self-pay | Admitting: Family Medicine

## 2023-12-25 DIAGNOSIS — F5101 Primary insomnia: Secondary | ICD-10-CM

## 2023-12-26 ENCOUNTER — Encounter: Payer: Self-pay | Admitting: Physical Therapy

## 2023-12-26 ENCOUNTER — Ambulatory Visit: Payer: Self-pay | Attending: Obstetrics and Gynecology | Admitting: Physical Therapy

## 2023-12-26 DIAGNOSIS — R293 Abnormal posture: Secondary | ICD-10-CM | POA: Diagnosis present

## 2023-12-26 DIAGNOSIS — G541 Lumbosacral plexus disorders: Secondary | ICD-10-CM | POA: Diagnosis present

## 2023-12-26 DIAGNOSIS — R269 Unspecified abnormalities of gait and mobility: Secondary | ICD-10-CM | POA: Insufficient documentation

## 2023-12-26 DIAGNOSIS — R29898 Other symptoms and signs involving the musculoskeletal system: Secondary | ICD-10-CM | POA: Insufficient documentation

## 2023-12-26 DIAGNOSIS — M62838 Other muscle spasm: Secondary | ICD-10-CM | POA: Diagnosis present

## 2023-12-26 DIAGNOSIS — M6281 Muscle weakness (generalized): Secondary | ICD-10-CM | POA: Insufficient documentation

## 2023-12-26 DIAGNOSIS — G6289 Other specified polyneuropathies: Secondary | ICD-10-CM | POA: Insufficient documentation

## 2023-12-26 DIAGNOSIS — G610 Guillain-Barre syndrome: Secondary | ICD-10-CM | POA: Diagnosis present

## 2023-12-26 DIAGNOSIS — R102 Pelvic and perineal pain: Secondary | ICD-10-CM | POA: Insufficient documentation

## 2023-12-26 DIAGNOSIS — R279 Unspecified lack of coordination: Secondary | ICD-10-CM | POA: Insufficient documentation

## 2023-12-26 NOTE — Therapy (Signed)
 OUTPATIENT PHYSICAL THERAPY FEMALE PELVIC TREATMENT   Patient Name: Anna Ramsey MRN: [id number] DOB:09-03-1966, 58 y.o., female Today's Date: 12/26/2023  END OF SESSION:  PT End of Session - 12/26/23 1406     Visit Number 15    Date for PT Re-Evaluation 02/03/24    Authorization Type [employer]    PT Start Time 1400    PT Stop Time 1440    PT Time Calculation (min) 40 min    Activity Tolerance Patient tolerated treatment well    Behavior During Therapy Anna Ramsey for tasks assessed/performed             Past Medical History:  Diagnosis Date   ADHD (attention deficit hyperactivity disorder) 2024   Dysuria    Erythematous bladder mucosa    Heart murmur    asymptomatic per pt --  no echo   Hematuria    History of cervical cancer    12/ 2014  Stage IB2--  s/p  TAH w/ BSO and pelvic lymphadectomy/  and radiation therapy   History of radiation therapy 10/20/13-11/28/13   pelvis 50.4 Anna Ramsey   Hypertension    Lesion of bladder    Mild intermittent asthma    Neuromuscular disorder (HCC)    Seasonal allergies    Thyroid disease    Past Surgical History:  Procedure Laterality Date   CYSTOSCOPY WITH BIOPSY N/A 04/29/2015   Procedure: CYSTOSCOPY WITH BIOPSY WITH FULGERATION;  Surgeon: Anna Gauze, MD;  Location: [facility];  Service: Urology;  Laterality: N/A;  1 HR [phone] [id number]   FOOT SURGERY Left 2010   RADICAL ABDOMINAL HYSTERECTOMY  09-05-2013   w/ BILATERAL SALPINGOOPHORECTOMY AND PELVIC LYMPHADECTOMY   Patient Active Problem List   Diagnosis Date Noted   SUI (stress urinary incontinence, female) 11/01/2023   History of urinary retention 11/01/2023   Neurogenic bladder 10/18/2023   Hematuria 10/18/2023   Acute urinary retention 10/18/2023   Preventive measure 12/20/2022   Stress and adjustment reaction 01/13/2021   Inattention 10/28/2019   Hypothyroid 11/13/2016   Neuropathy involving both lower extremities 11/13/2016   Cystitis,  radiation 05/22/2014   Cervical cancer (HCC) 09/01/2013   Adenocarcinoma of cervix, 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   Asthma, mild intermittent 07/07/2009   INSOMNIA 05/28/2008   Generalized anxiety disorder 05/07/2008   HYPERTENSION, BENIGN 05/31/2007    PCP: Anna Seashore, MD  REFERRING PROVIDER: Selmer Dominion, NP   REFERRING DIAG:  N32.81 (ICD-10-CM) - OAB (overactive bladder)  N32.89 (ICD-10-CM) - Bladder spasms  N39.3 (ICD-10-CM) - SUI (stress urinary incontinence, female)  N39.41 (ICD-10-CM) - Urge incontinence    THERAPY DIAG:  Muscle weakness (generalized)  Pelvic pain  Rationale for Evaluation and Treatment: Rehabilitation  ONSET DATE: 2015  SUBJECTIVE:  SUBJECTIVE STATEMENT: Bowels are moving fine. No urinary leakage since last visit. No stress in the pelvic region. I do not wake up till 5 AM. Abdomen feels good.  Fluid intake: Yes: water    PAIN:  Are you having pain? Yes NPRS scale: 0/10, 11/23/23 Pain location:  lower abdominal  Pain type: aching Pain description: constant   Aggravating factors: walking Relieving factors: try not to walk  PRECAUTIONS: Other: cervical cancer with radiation  RED FLAGS: None   WEIGHT BEARING RESTRICTIONS: No  FALLS:  Has patient fallen in last 6 months? Yes. 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; unable to sit with legs down due to them falling asleep  PLOF: Independent  PATIENT GOALS: to work on core and pelvic floor                                                                      PERTINENT HISTORY:  ADHD; Cervical Cancer 2014; 9  weeks of radiation; Radical hysterectomy;   BOWEL MOVEMENT: Pain with bowel movement: No Type of bowel movement:Type (Bristol Stool Scale) Type 4, Frequency daily, Strain No, and Splinting no  has to focus on the abdominal muscles to work a certain way for a bowel movement Fully empty rectum: No, always feels like the rectal area is full 11/14/23 fully empty rectum  Leakage: No Fiber supplement: No  URINATION: Pain with urination: No Fully empty bladder: Yes:   Stream: Weak Urgency: No Frequency: every 2-3 hours Leakage: Urge to void, Walking to the bathroom,   Lifting, and Bending forward leaks with all when she waits too long 12/26/23: No urinary leakage Pads: now wearing regular panties  INTERCOURSE: Pain with intercourse:  only if she does it a certain way Ability to have vaginal penetration:  Yes:   Climax: yes Marinoff Scale: 0/3  PREGNANCY: Vaginal deliveries 1   OBJECTIVE:  Note: Objective measures were completed at Evaluation unless otherwise noted.   COGNITION: Overall cognitive status: Within functional limits for tasks assessed      GAIT: Assistive device utilized: Single point cane Level of assistance: Complete Independence Comments: uses a wheelchair in public  POSTURE: No Significant postural limitations  PELVIC ALIGNMENT:equal   PALPATION:                External Perineal Exam Intact                             Internal Pelvic Floor decreased mobility of the perineal body, tightness along the introitus, levator ani and obturator internist, vaginal canal is shortened; decreased mobility around the bladder  Patient confirms identification and approves PT to assess internal pelvic floor and treatment Yes  PELVIC MMT:   MMT eval 08/20/23 10/08/23  Vaginal 2/5 2/5 with slight lift 2/5 with lots of tactile and verbal cues  (Blank rows = not tested)        TONE: average  PROLAPSE: none  TODAY'S TREATMENT:   12/26/23 Neuromuscular  re-education: Form correction: Bridges with therapist  keeping her feet flat to work on ankle dorsiflexion Quadruped hip extension with therapist keeping core engaged and using the gluteals 10 x each left leg harder than right  Supine heel slides with squeezing ball between hands to assist with core and verbal cues to not arch back or lift hip up, therapist assists keeping knee in neutral 12 x each leg Supine with legs straight trying to adduct the legs while pressing ball between hands to engage the core and needs assistance for the hip adduction Hips on side and trunk is flat with patient rotating as she is punching arm forward to work the obliques and needed hand  Exercises: Stretches/mobility: Gastroc stretch in long sitting using the strap holding 30 sec bil.  Supine bilateral hamstring stretch    12/21/23 Right ankle P/ROM of plantarflexion 0 degrees Manual: Mobilization: Ankle mobilization to increase right ankle dorsiflexion grade 3 Neuromuscular re-education: Pelvic floor contraction training: Sitting toe scrunches with pelvic floor contraction Sitting rolling ball under foot to relax the pelvic floor, increase ankle dorsiflexion, and increase sensation in the right foot Exercises: Stretches/mobility: Ankle gastroc stretch holding 30 sec 3 times bil.  Sitting keeping feet flat and push foot flat to increase ankle plantarflexion Strengthening: Nustep 10 minutes level 5 with therapist keeping knees apart while assessing patient Heel slide with slider and assistance to prevent knees from going inward    11/28/23 Exercises: Strengthening: Nustep 5 minutes level 1 with therapist keeping knees apart while assessing patient Heel slide with slider and assistance to prevent knees from going inward Supine hip abduction/adduction with slider and some assistance from therapist Prone hip extension with therapist guiding her toes onto the mat Prone on elbows to increase lumbar  extension Quadruped contract the upper and lower abdomen keeping spinal neutral then weight shift from side to side then forward and back Sitting with ball squeeze and press hand into mat to engage the core and pelvic floor Sit to stand with hands in the groin and min assistance to keep trunk upright and not go forward    11/23/23 Manual: Myofascial release: Fascial release to the abdomen in different areas of the abdomen to reduce the restrictions and increased softness of the abdomen Exercises: Strengthening: Bridge with ball squeeze with x-light strap around knees and using arms to assist with lift Supine with ball squeeze and x-light strap around knees bringing knees to chest and down with abdominal contraction Supine with ball squeeze and x-light strap around knees bringing knees to side using the obliques to contract and rotated 15 x each way       PATIENT EDUCATION: 12/21/23 Education details: Access Code: [email or web address], educated patient on moisturizers, educated patient on how to massage her abdomen, educated patient on how to massage the pelvic floor muscles with her vaginal massager Person educated: Patient Education method: Explanation, Demonstration, Tactile cues, Verbal cues, and Handouts Education comprehension: verbalized understanding, returned demonstration, verbal cues required, tactile cues required, and needs further education    HOME EXERCISE PROGRAM: 12/21/23 Access Code: [email or web address] URL: [email or web address] Date: 11/05/2023 Prepared by: Eulis Foster  Exercises -Access Code: [email or web address] URL: [email or web address] Date: 12/21/2023 Prepared by: Eulis Foster  Exercises - - Seated Calf Towel Stretch  - 1 x daily - 7 x weekly - 1 sets - 3 reps - 30 sec hold - Seated Toe Towel Scrunches  - 1 x daily - 7 x weekly - 1 sets - 10 reps  ASSESSMENT:  CLINICAL IMPRESSION: Patient is a 58 y.o. female who was  seen today for physical therapy  treatment  for Overactive bladder, stress incontinence,bladder spasms.  No issues with bowel movements at this time.   She has not leaked urine or had pain since last visit. She is working on core, pelvic floor and lower extremity strength to assist with continence.   Patient would benefit from skilled therapy to work on pelvic floor strength and coordination to reduce her leakage.   OBJECTIVE IMPAIRMENTS: decreased coordination, decreased endurance, decreased strength, increased fascial restrictions, increased muscle spasms, and pain.   ACTIVITY LIMITATIONS: continence and locomotion level  PARTICIPATION LIMITATIONS: community activity and occupation  PERSONAL FACTORS: Time since onset of injury/illness/exacerbation are also affecting patient's functional outcome.   REHAB POTENTIAL: Good  CLINICAL DECISION MAKING: Evolving/moderate complexity  EVALUATION COMPLEXITY: Moderate   GOALS: Goals reviewed with patient? Yes  SHORT TERM GOALS: Target date: 09/02/23  Patient is independent with manual work to the pelvic floor to lengthen the tissue.  Baseline: Goal status: Met 08/20/23  2.  Patient understands on vaginal moisturizers to improve vaginal health.  Baseline:  Goal status: Met 08/20/23  3.  Patient is able to contract and fully lengthen her pelvic floor.  Baseline:  Goal status: Met 12/26/23   LONG TERM GOALS: Target date: 02/03/24  Patient is independent with advanced HEP to increase pelvic floor and core strength.  Baseline:  Goal status: ongoing 12/21/23  2.  Patient pelvic floor strength is >/= 3/5 so her urinary leakage decreased >/= 60%.  Baseline: leakage improved by 95% Goal status:  ongoing 10/05/23  3.  Patient is able to bend forward with minimal to no urinary leakage due to improved coordination with hip flexion.  Baseline:  Goal status: ongoing 12/21/23  4.  Patient is able to use the urge to void behavioral technique to delay the urge to give her time to go to the  bathroom.  Baseline: timed voiding.  Goal status: met 11/05/23   PLAN:  PT FREQUENCY: 1x/week  PT DURATION: 6 months  PLANNED INTERVENTIONS: 97110-Therapeutic exercises, 97530- Therapeutic activity, 97112- Neuromuscular re-education, 97140- Manual therapy, 97014- Electrical stimulation (unattended), [phone]- Electrical stimulation (manual), Patient/Family education, Dry Needling, and Biofeedback  PLAN FOR NEXT SESSION:  work on lifting and bending forward, exercise the core and lower extremity, work on home program  Eulis Foster, [place] 12/26/23 2:07 PM

## 2024-01-02 ENCOUNTER — Ambulatory Visit: Payer: Self-pay | Admitting: Physical Therapy

## 2024-01-02 ENCOUNTER — Encounter: Payer: Self-pay | Admitting: Physical Therapy

## 2024-01-02 DIAGNOSIS — M6281 Muscle weakness (generalized): Secondary | ICD-10-CM

## 2024-01-02 DIAGNOSIS — R102 Pelvic and perineal pain: Secondary | ICD-10-CM

## 2024-01-02 NOTE — Therapy (Signed)
 OUTPATIENT PHYSICAL THERAPY FEMALE PELVIC TREATMENT   Patient Name: Anna Ramsey MRN: [id number] DOB:07-Nov-1965, 58 y.o., female Today's Date: 01/02/2024  END OF SESSION:  PT End of Session - 01/02/24 0803     Visit Number 16    Date for PT Re-Evaluation 02/03/24    Authorization Type [employer]    PT Start Time 0800    PT Stop Time 0840    PT Time Calculation (min) 40 min    Activity Tolerance Patient tolerated treatment well    Behavior During Therapy [facility] for tasks assessed/performed             Past Medical History:  Diagnosis Date   ADHD (attention deficit hyperactivity disorder) 2024   Dysuria    Erythematous bladder mucosa    Heart murmur    asymptomatic per pt --  no echo   Hematuria    History of cervical cancer    12/ 2014  Stage IB2--  s/p  TAH w/ BSO and pelvic lymphadectomy/  and radiation therapy   History of radiation therapy 10/20/13-11/28/13   pelvis 50.4 Kristain Filo   Hypertension    Lesion of bladder    Mild intermittent asthma    Neuromuscular disorder (HCC)    Seasonal allergies    Thyroid disease    Past Surgical History:  Procedure Laterality Date   CYSTOSCOPY WITH BIOPSY N/A 04/29/2015   Procedure: CYSTOSCOPY WITH BIOPSY WITH FULGERATION;  Surgeon: Malen Gauze, MD;  Location: [facility];  Service: Urology;  Laterality: N/A;  1 HR [phone] [id number]   FOOT SURGERY Left 2010   RADICAL ABDOMINAL HYSTERECTOMY  09-05-2013   w/ BILATERAL SALPINGOOPHORECTOMY AND PELVIC LYMPHADECTOMY   Patient Active Problem List   Diagnosis Date Noted   SUI (stress urinary incontinence, female) 11/01/2023   History of urinary retention 11/01/2023   Neurogenic bladder 10/18/2023   Hematuria 10/18/2023   Acute urinary retention 10/18/2023   Preventive measure 12/20/2022   Stress and adjustment reaction 01/13/2021   Inattention 10/28/2019   Hypothyroid 11/13/2016   Neuropathy involving both lower extremities 11/13/2016   Cystitis,  radiation 05/22/2014   Cervical cancer (HCC) 09/01/2013   Adenocarcinoma of cervix, 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   Asthma, mild intermittent 07/07/2009   INSOMNIA 05/28/2008   Generalized anxiety disorder 05/07/2008   HYPERTENSION, BENIGN 05/31/2007    PCP: Merrilee Seashore, MD  REFERRING PROVIDER: Selmer Dominion, NP   REFERRING DIAG:  N32.81 (ICD-10-CM) - OAB (overactive bladder)  N32.89 (ICD-10-CM) - Bladder spasms  N39.3 (ICD-10-CM) - SUI (stress urinary incontinence, female)  N39.41 (ICD-10-CM) - Urge incontinence    THERAPY DIAG:  Muscle weakness (generalized)  Pelvic pain  Rationale for Evaluation and Treatment: Rehabilitation  ONSET DATE: 2015  SUBJECTIVE:  SUBJECTIVE STATEMENT: No bladder issues since last visit.  Fluid intake: Yes: water    PAIN:  Are you having pain? Yes NPRS scale: 0/10, 11/23/23 Pain location:  lower abdominal  Pain type: aching Pain description: constant   Aggravating factors: walking Relieving factors: try not to walk  PRECAUTIONS: Other: cervical cancer with radiation  RED FLAGS: None   WEIGHT BEARING RESTRICTIONS: No  FALLS:  Has patient fallen in last 6 months? Yes. 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; unable to sit with legs down due to them falling asleep  PLOF: Independent  PATIENT GOALS: to work on core and pelvic floor                                                                      PERTINENT HISTORY:  ADHD; Cervical Cancer 2014; 9 weeks of radiation; Radical hysterectomy;   BOWEL MOVEMENT: Pain with bowel movement: No Type of  bowel movement:Type (Bristol Stool Scale) Type 4, Frequency daily, Strain No, and Splinting no  has to focus on the abdominal muscles to work a certain way for a bowel movement Fully empty rectum: No, always feels like the rectal area is full 11/14/23 fully empty rectum  Leakage: No Fiber supplement: No  URINATION: Pain with urination: No Fully empty bladder: Yes:   Stream: Weak Urgency: No Frequency: every 2-3 hours Leakage: Urge to void, Walking to the bathroom,   Lifting, and Bending forward leaks with all when she waits too long 12/26/23: No urinary leakage Pads: now wearing regular panties  INTERCOURSE: Pain with intercourse:  only if she does it a certain way Ability to have vaginal penetration:  Yes:   Climax: yes Marinoff Scale: 0/3  PREGNANCY: Vaginal deliveries 1   OBJECTIVE:  Note: Objective measures were completed at Evaluation unless otherwise noted.   COGNITION: Overall cognitive status: Within functional limits for tasks assessed      GAIT: Assistive device utilized: Single point cane Level of assistance: Complete Independence Comments: uses a wheelchair in public  POSTURE: No Significant postural limitations  PELVIC ALIGNMENT:equal   PALPATION:                External Perineal Exam Intact                             Internal Pelvic Floor decreased mobility of the perineal body, tightness along the introitus, levator ani and obturator internist, vaginal canal is shortened; decreased mobility around the bladder  Patient confirms identification and approves PT to assess internal pelvic floor and treatment Yes  PELVIC MMT:   MMT eval 08/20/23 10/08/23  Vaginal 2/5 2/5 with slight lift 2/5 with lots of tactile and verbal cues  (Blank rows = not tested)        TONE: average  PROLAPSE: none  TODAY'S TREATMENT:   01/02/24 Neuromuscular re-education: Form correction: Bridge with assistance to prevent knees from turning in and verbal cues keeping  pelvis leveled.  Supine heel slides with squeezing ball between hands  to assist with core and verbal cues to not arch back or lift hip up, therapist assists keeping knee in neutral 12 x each leg Hips on side and trunk is flat with patient rotating as she is punching arm forward to work the obliques and needed hand  Supine with ball between knees and lift to 90 degrees hip flexion with therapist giving assistance to keep knees together Supine holding ball and go into long sitting with slight assistance from PT Exercises: Stretches/mobility: Calf stretch with strap holding 30 sec bilateral  Strengthening: Nustep for 5 minutes on level 5 while assessing patient and therapist assisted with preventing the right knee turning inward.  Prone lift alternating arm and leg for core strength    12/26/23 Neuromuscular re-education: Form correction: Bridges with therapist keeping her feet flat to work on ankle dorsiflexion Quadruped hip extension with therapist keeping core engaged and using the gluteals 10 x each left leg harder than right  Supine heel slides with squeezing ball between hands to assist with core and verbal cues to not arch back or lift hip up, therapist assists keeping knee in neutral 12 x each leg Supine with legs straight trying to adduct the legs while pressing ball between hands to engage the core and needs assistance for the hip adduction Hips on side and trunk is flat with patient rotating as she is punching arm forward to work the obliques and needed hand  Exercises: Stretches/mobility: Gastroc stretch in long sitting using the strap holding 30 sec bil.  Supine bilateral hamstring stretch    12/21/23 Right ankle P/ROM of plantarflexion 0 degrees Manual: Mobilization: Ankle mobilization to increase right ankle dorsiflexion grade 3 Neuromuscular re-education: Pelvic floor contraction training: Sitting toe scrunches with pelvic floor contraction Sitting rolling ball under foot  to relax the pelvic floor, increase ankle dorsiflexion, and increase sensation in the right foot Exercises: Stretches/mobility: Ankle gastroc stretch holding 30 sec 3 times bil.  Sitting keeping feet flat and push foot flat to increase ankle plantarflexion Strengthening: Nustep 10 minutes level 5 with therapist keeping knees apart while assessing patient Heel slide with slider and assistance to prevent knees from going inward        PATIENT EDUCATION: 12/21/23 Education details: Access Code: [email or web address], educated patient on moisturizers, educated patient on how to massage her abdomen, educated patient on how to massage the pelvic floor muscles with her vaginal massager Person educated: Patient Education method: Explanation, Demonstration, Tactile cues, Verbal cues, and Handouts Education comprehension: verbalized understanding, returned demonstration, verbal cues required, tactile cues required, and needs further education    HOME EXERCISE PROGRAM: 12/21/23 Access Code: [email or web address] URL: [email or web address] Date: 11/05/2023 Prepared by: Eulis Foster  Exercises -Access Code: [email or web address] URL: [email or web address] Date: 12/21/2023 Prepared by: Eulis Foster  Exercises - - Seated Calf Towel Stretch  - 1 x daily - 7 x weekly - 1 sets - 3 reps - 30 sec hold - Seated Toe Towel Scrunches  - 1 x daily - 7 x weekly - 1 sets - 10 reps  ASSESSMENT:  CLINICAL IMPRESSION: Patient is a 58 y.o. female who was seen today for physical therapy  treatment for Overactive bladder, stress incontinence,bladder spasms.  No issues with bowel movements at this time.   She has not leaked urine or had pain since last visit. She is working on core, pelvic floor and lower extremity strength to assist with continence.   Patient is able to walk to the bathroom without leaking urine. She can go out  and no worry about having to find a bathroom or leaking urine.   OBJECTIVE IMPAIRMENTS:  decreased coordination, decreased endurance, decreased strength, increased fascial restrictions, increased muscle spasms, and pain.   ACTIVITY LIMITATIONS: continence and locomotion level  PARTICIPATION LIMITATIONS: community activity and occupation  PERSONAL FACTORS: Time since onset of injury/illness/exacerbation are also affecting patient's functional outcome.   REHAB POTENTIAL: Good  CLINICAL DECISION MAKING: Evolving/moderate complexity  EVALUATION COMPLEXITY: Moderate   GOALS: Goals reviewed with patient? Yes  SHORT TERM GOALS: Target date: 09/02/23  Patient is independent with manual work to the pelvic floor to lengthen the tissue.  Baseline: Goal status: Met 08/20/23  2.  Patient understands on vaginal moisturizers to improve vaginal health.  Baseline:  Goal status: Met 08/20/23  3.  Patient is able to contract and fully lengthen her pelvic floor.  Baseline:  Goal status: Met 12/26/23   LONG TERM GOALS: Target date: 02/03/24  Patient is independent with advanced HEP to increase pelvic floor and core strength.  Baseline:  Goal status: Met 01/02/24  2.  Patient pelvic floor strength is >/= 3/5 so her urinary leakage decreased >/= 60%.  Baseline: leakage improved by 95% Goal status:  Met 01/02/24  3.  Patient is able to bend forward with minimal to no urinary leakage due to improved coordination with hip flexion.  Baseline:  Goal status: met 01/02/24  4.  Patient is able to use the urge to void behavioral technique to delay the urge to give her time to go to the bathroom.  Baseline: timed voiding.  Goal status: met 11/05/23   PLAN: Discharge this visit to HEP    Eulis Foster, PT 01/02/24 8:45 AM   PHYSICAL THERAPY DISCHARGE SUMMARY  Visits from Start of Care: 16  Current functional level related to goals / functional outcomes: See above.    Remaining deficits: See above.    Education / Equipment: HEP   Patient agrees to discharge. Patient goals were  met. Patient is being discharged due to meeting the stated rehab goals. Thank you for the referral.   Eulis Foster, PT 01/02/24 8:45 AM

## 2024-01-10 ENCOUNTER — Other Ambulatory Visit: Payer: Self-pay | Admitting: Family Medicine

## 2024-01-10 ENCOUNTER — Encounter: Payer: Self-pay | Admitting: Neurology

## 2024-01-10 DIAGNOSIS — F411 Generalized anxiety disorder: Secondary | ICD-10-CM

## 2024-01-14 ENCOUNTER — Other Ambulatory Visit: Payer: Self-pay

## 2024-01-14 DIAGNOSIS — G541 Lumbosacral plexus disorders: Secondary | ICD-10-CM

## 2024-01-14 DIAGNOSIS — G6289 Other specified polyneuropathies: Secondary | ICD-10-CM

## 2024-01-14 DIAGNOSIS — R29898 Other symptoms and signs involving the musculoskeletal system: Secondary | ICD-10-CM

## 2024-01-14 DIAGNOSIS — G610 Guillain-Barre syndrome: Secondary | ICD-10-CM

## 2024-01-16 ENCOUNTER — Ambulatory Visit: Admitting: Physical Therapy

## 2024-01-16 DIAGNOSIS — R269 Unspecified abnormalities of gait and mobility: Secondary | ICD-10-CM

## 2024-01-16 DIAGNOSIS — R279 Unspecified lack of coordination: Secondary | ICD-10-CM

## 2024-01-16 DIAGNOSIS — M6281 Muscle weakness (generalized): Secondary | ICD-10-CM | POA: Diagnosis not present

## 2024-01-16 DIAGNOSIS — M62838 Other muscle spasm: Secondary | ICD-10-CM

## 2024-01-16 DIAGNOSIS — R293 Abnormal posture: Secondary | ICD-10-CM

## 2024-01-16 NOTE — Therapy (Unsigned)
 OUTPATIENT PHYSICAL THERAPY FEMALE PELVIC EVALUATION   Patient Name: Anna Ramsey MRN: [id number] DOB:[birth year], 58 y.o., female Today's Date: 01/16/2024  END OF SESSION:  PT End of Session - 01/16/24 1623     Visit Number 1    Date for PT Re-Evaluation 07/17/24    Authorization Type [employer]    PT Start Time 1618    PT Stop Time 1657    PT Time Calculation (min) 39 min    Activity Tolerance Patient tolerated treatment well    Behavior During Therapy [facility] for tasks assessed/performed             Past Medical History:  Diagnosis Date   ADHD (attention deficit hyperactivity disorder) 2024   Dysuria    Erythematous bladder mucosa    Heart murmur    asymptomatic per pt --  no echo   Hematuria    History of cervical cancer    12/ 2014  Stage IB2--  s/p  TAH w/ BSO and pelvic lymphadectomy/  and radiation therapy   History of radiation therapy 10/20/13-11/28/13   pelvis 50.4 gray   Hypertension    Lesion of bladder    Mild intermittent asthma    Neuromuscular disorder (HCC)    Seasonal allergies    Thyroid disease    Past Surgical History:  Procedure Laterality Date   CYSTOSCOPY WITH BIOPSY N/A 04/29/2015   Procedure: CYSTOSCOPY WITH BIOPSY WITH FULGERATION;  Surgeon: Marco Severs, MD;  Location: [facility];  Service: Urology;  Laterality: N/A;  1 HR [phone] [id number]   FOOT SURGERY Left 2010   RADICAL ABDOMINAL HYSTERECTOMY  09-05-2013   w/ BILATERAL SALPINGOOPHORECTOMY AND PELVIC LYMPHADECTOMY   Patient Active Problem List   Diagnosis Date Noted   SUI (stress urinary incontinence, female) 11/01/2023   History of urinary retention 11/01/2023   Neurogenic bladder 10/18/2023   Hematuria 10/18/2023   Acute urinary retention 10/18/2023   Preventive measure 12/20/2022   Stress and adjustment reaction 01/13/2021   Inattention 10/28/2019   Hypothyroid 11/13/2016   Neuropathy involving both lower extremities 11/13/2016   Cystitis,  radiation 05/22/2014   Cervical cancer (HCC) 09/01/2013   Adenocarcinoma of cervix, 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   Asthma, mild intermittent 07/07/2009   INSOMNIA 05/28/2008   Generalized anxiety disorder 05/07/2008   HYPERTENSION, BENIGN 05/31/2007    PCP: Mickel Alanis, MD  REFERRING PROVIDER: Zuleta, Kaitlin G, NP   REFERRING DIAG:  N32.81 (ICD-10-CM) - OAB (overactive bladder)  N32.89 (ICD-10-CM) - Bladder spasms  N39.3 (ICD-10-CM) - SUI (stress urinary incontinence, female)  N39.41 (ICD-10-CM) - Urge incontinence    THERAPY DIAG:  Muscle weakness (generalized)  Abnormality of gait and mobility  Abnormal posture  Unspecified lack of coordination  Other muscle spasm  Rationale for Evaluation and Treatment: Rehabilitation  ONSET DATE: 2015  SUBJECTIVE:  SUBJECTIVE STATEMENT: Right leg worse than left but both painful. Right has instances of knee just giving out, has neuropathy - like weakness, pain since radiation in 2015. The nerves have been affected and greatly limiting her activity. Does have symptoms of numbness, sensory deficits, weakness, ataxic gait, and painful but all vary based on the day.     Fluid intake: Yes: water     PAIN:  Are you having pain? Yes NPRS scale: 3-9/10 Pain location: bil legs Rt>Lt  Pain type: aching Pain description: constant   Aggravating factors: walking, end of day, rainy days Relieving factors: try not to walk, decreasing activities    PRECAUTIONS: Other: cervical cancer with radiation  RED FLAGS: None   WEIGHT BEARING RESTRICTIONS: No  FALLS:  Has patient fallen in last 6 months? Yes. Number of falls 1x/weekly due to weakness and decreased balance from having the radiation, trouble with low light  - reports she does need to furniture  walk to help with balance  LIVING ENVIRONMENT: Lives with: lives with their family  OCCUPATION: sits with laptop on her lap for 12 hours per day; unable to sit with legs down due to them falling asleep  PLOF: Independent  PATIENT GOALS: to strengthening enough to walk across grass, wants to have better balance, stronger legs                                        PERTINENT HISTORY:  ADHD; Cervical Cancer 2014; 9 weeks of radiation; Radical hysterectomy;    OBJECTIVE:  Note: Objective measures were completed at Evaluation unless otherwise noted.  DIAGNOSTIC FINDINGS: ***  PATIENT SURVEYS:  {rehab surveys:24030}  COGNITION: Overall cognitive status: Within functional limits for tasks assessed     SENSATION: {sensation:27233}  EDEMA:  Rt ankle sometimes swells per pt  MUSCLE LENGTH: Bil hamstrings and adductors limited by 25%;  POSTURE: rounded shoulders and forward head  PALPATION: ***  LOWER EXTREMITY ROM:  {AROM/PROM:27142} ROM Right eval Left eval  Hip flexion    Hip extension    Hip abduction    Hip adduction    Hip internal rotation    Hip external rotation    Knee flexion    Knee extension    Ankle dorsiflexion    Ankle plantarflexion    Ankle inversion    Ankle eversion     (Blank rows = not tested)  LOWER EXTREMITY MMT:  MMT Right eval Left eval  Hip flexion    Hip extension    Hip abduction    Hip adduction    Hip internal rotation    Hip external rotation    Knee flexion    Knee extension    Ankle dorsiflexion    Ankle plantarflexion    Ankle inversion    Ankle eversion     (Blank rows = not tested)  LOWER EXTREMITY SPECIAL TESTS:  {LEspecialtests:26242}  FUNCTIONAL TESTS:  {Functional tests:24029}  GAIT: Distance walked: *** Assistive device utilized: {Assistive devices:23999} Level of assistance: {Levels of assistance:24026} Comments: ***  TREATMENT DATE: ***    PATIENT EDUCATION:  Education details: *** Person educated: {Person educated:25204} Education method: {Education Method:25205} Education comprehension: {Education Comprehension:25206}  HOME EXERCISE PROGRAM: ***  ASSESSMENT:  CLINICAL IMPRESSION: Patient is a *** y.o. *** who was seen today for physical therapy evaluation and treatment for ***.   OBJECTIVE IMPAIRMENTS: {opptimpairments:25111}.   ACTIVITY LIMITATIONS: {activitylimitations:27494}  PARTICIPATION LIMITATIONS: {participationrestrictions:25113}  PERSONAL FACTORS: {Personal factors:25162} are also affecting patient's functional outcome.   REHAB POTENTIAL: {rehabpotential:25112}  CLINICAL DECISION MAKING: {clinical decision making:25114}  EVALUATION COMPLEXITY: {Evaluation complexity:25115}   GOALS: Goals reviewed with patient? {yes/no:20286}  SHORT TERM GOALS: Target date: *** *** Baseline: Goal status: INITIAL  2.  *** Baseline:  Goal status: INITIAL  3.  *** Baseline:  Goal status: INITIAL  4.  *** Baseline:  Goal status: INITIAL  5.  *** Baseline:  Goal status: INITIAL  6.  *** Baseline:  Goal status: INITIAL  LONG TERM GOALS: Target date: ***  *** Baseline:  Goal status: INITIAL  2.  *** Baseline:  Goal status: INITIAL  3.  *** Baseline:  Goal status: INITIAL  4.  *** Baseline:  Goal status: INITIAL  5.  *** Baseline:  Goal status: INITIAL  6.  *** Baseline:  Goal status: INITIAL   PLAN:  PT FREQUENCY: {rehab frequency:25116}  PT DURATION: {rehab duration:25117}  PLANNED INTERVENTIONS: {rehab planned interventions:25118::"97110-Therapeutic exercises","97530- Therapeutic [phone]- Neuromuscular re-education","97535- Self JXBJ","47829- Manual therapy"}  PLAN FOR NEXT SESSION: Lang Pipes, PT 01/16/2024, 4:34 PM  OBJECTIVE:  Note: Objective measures were completed at  Evaluation unless otherwise noted.   COGNITION: Overall cognitive status: Within functional limits for tasks assessed      GAIT: Assistive device utilized: Single point cane Level of assistance: Complete Independence Comments: uses a wheelchair in public  POSTURE: No Significant postural limitations  PELVIC ALIGNMENT:equal   PALPATION:                External Perineal Exam Intact                             Internal Pelvic Floor decreased mobility of the perineal body, tightness along the introitus, levator ani and obturator internist, vaginal canal is shortened; decreased mobility around the bladder  Patient confirms identification and approves PT to assess internal pelvic floor and treatment Yes  PELVIC MMT:   MMT eval  Vaginal 2/5  (Blank rows = not tested)        TONE: average  PROLAPSE: none  TODAY'S TREATMENT:                                                                                                                              DATE: 08/06/23  EVAL See below   PATIENT EDUCATION:  Education details: educated patient on how to use her vaginal massager to massage the pelvic floor muscles to reduce the tightness Person  educated: Patient Education method: Explanation, Demonstration, Tactile cues, Verbal cues, and Handouts Education comprehension: verbalized understanding, returned demonstration, verbal cues required, tactile cues required, and needs further education  HOME EXERCISE PROGRAM: See above.   ASSESSMENT:  CLINICAL IMPRESSION: Patient is a 58 y.o. female who was seen today for physical therapy evaluation and treatment for Overactive bladder, stress incontinence,bladder spasms . She had cervical cancer 2014 with 9 weeks of radiation then a radical hysterectomy. Patient has had issues with the after effects from the radiation. She has difficulty with walking and needs to use a cane and wheelchair when in crowds. Patient reports her lower abdominal  pain is 3/10 when she is walking. She has to sit with her legs stretched out and not hanging down due to losing feeling her her legs. She leaks urine with urge to void, walking to the bathroom, coughing, sneezing, laughing, exercise, lifting, and bending forward. She  leaks with all activities  when she waits too long. Pelvic floor strength is 2/5. She has tightness along the introitus, levator ani and obturator internist. Her vaginal canal  is shortened. She has decreased mobility of the bladder and perineal body. Patient would benefit from skilled therapy to work on pelvic floor strength and coordination to reduce her leakage.   OBJECTIVE IMPAIRMENTS: decreased coordination, decreased endurance, decreased strength, increased fascial restrictions, increased muscle spasms, and pain.   ACTIVITY LIMITATIONS: continence and locomotion level  PARTICIPATION LIMITATIONS: community activity and occupation  PERSONAL FACTORS: Time since onset of injury/illness/exacerbation are also affecting patient's functional outcome.   REHAB POTENTIAL: Good  CLINICAL DECISION MAKING: Evolving/moderate complexity  EVALUATION COMPLEXITY: Moderate   GOALS: Goals reviewed with patient? Yes  SHORT TERM GOALS: Target date: 02/13/24  ***  Baseline: Goal status: INITIAL  2.  Patient understands on vaginal moisturizers to improve vaginal health.  Baseline:  Goal status: INITIAL  3.  Patient is able to contract and fully lengthen her pelvic floor.  Baseline:  Goal status: INITIAL   LONG TERM GOALS: Target date: 02/03/24  Patient is independent with advanced HEP to increase pelvic floor and core strength.  Baseline:  Goal status: INITIAL  2.  Patient pelvic floor strength is >/= 3/5 so her urinary leakage decreased >/= 60%.  Baseline:  Goal status: INITIAL  3.  Patient is able to bend forward with minimal to no urinary leakage due to improved coordination with hip flexion.  Baseline:  Goal status:  INITIAL  4.  Patient is able to use the urge to void behavioral technique to delay the urge to give her time to go to the bathroom.  Baseline:  Goal status: INITIAL   PLAN:  PT FREQUENCY: 1x/week  PT DURATION: 6 months  PLANNED INTERVENTIONS: 97110-Therapeutic exercises, 97530- Therapeutic activity, [id number]- Neuromuscular re-education, 97140- Manual therapy, 97014- Electrical stimulation (unattended), [id number]- Electrical stimulation (manual), Patient/Family education, Dry Needling, and Biofeedback  PLAN FOR NEXT SESSION: manual work to the pelvic floor, around the bladder, Abdominal work, pelvic floor contraction, discuss vaginal moisturizers.    Marsha Skeen, PT 01/16/24 4:24 PM

## 2024-01-17 ENCOUNTER — Other Ambulatory Visit: Payer: Self-pay | Admitting: Family Medicine

## 2024-01-21 ENCOUNTER — Ambulatory Visit: Attending: Neurology | Admitting: Physical Therapy

## 2024-01-21 ENCOUNTER — Encounter: Payer: Self-pay | Admitting: Physical Therapy

## 2024-01-21 DIAGNOSIS — R293 Abnormal posture: Secondary | ICD-10-CM | POA: Insufficient documentation

## 2024-01-21 DIAGNOSIS — R269 Unspecified abnormalities of gait and mobility: Secondary | ICD-10-CM | POA: Diagnosis present

## 2024-01-21 DIAGNOSIS — M6281 Muscle weakness (generalized): Secondary | ICD-10-CM | POA: Diagnosis present

## 2024-01-21 DIAGNOSIS — R279 Unspecified lack of coordination: Secondary | ICD-10-CM | POA: Insufficient documentation

## 2024-01-21 DIAGNOSIS — M62838 Other muscle spasm: Secondary | ICD-10-CM | POA: Diagnosis present

## 2024-01-21 NOTE — Therapy (Signed)
 OUTPATIENT PHYSICAL THERAPY FEMALE PELVIC TREATMENT   Patient Name: Anna Ramsey MRN: [id number] DOB:June 13, 1966, 58 y.o., female Today's Date: 01/21/2024  END OF SESSION:  PT End of Session - 01/21/24 0930     Visit Number 2    Authorization Type [employer]    PT Start Time 0848    PT Stop Time 0930    PT Time Calculation (min) 42 min    Activity Tolerance Patient tolerated treatment well    Behavior During Therapy [facility] for tasks assessed/performed              Past Medical History:  Diagnosis Date   ADHD (attention deficit hyperactivity disorder) 2024   Dysuria    Erythematous bladder mucosa    Heart murmur    asymptomatic per pt --  no echo   Hematuria    History of cervical cancer    12/ 2014  Stage IB2--  s/p  TAH w/ BSO and pelvic lymphadectomy/  and radiation therapy   History of radiation therapy 10/20/13-11/28/13   pelvis 50.4 gray   Hypertension    Lesion of bladder    Mild intermittent asthma    Neuromuscular disorder (HCC)    Seasonal allergies    Thyroid disease    Past Surgical History:  Procedure Laterality Date   CYSTOSCOPY WITH BIOPSY N/A 04/29/2015   Procedure: CYSTOSCOPY WITH BIOPSY WITH FULGERATION;  Surgeon: Marco Severs, MD;  Location: [facility];  Service: Urology;  Laterality: N/A;  1 HR [phone] [id number]   FOOT SURGERY Left 2010   RADICAL ABDOMINAL HYSTERECTOMY  09-05-2013   w/ BILATERAL SALPINGOOPHORECTOMY AND PELVIC LYMPHADECTOMY   Patient Active Problem List   Diagnosis Date Noted   SUI (stress urinary incontinence, female) 11/01/2023   History of urinary retention 11/01/2023   Neurogenic bladder 10/18/2023   Hematuria 10/18/2023   Acute urinary retention 10/18/2023   Preventive measure 12/20/2022   Stress and adjustment reaction 01/13/2021   Inattention 10/28/2019   Hypothyroid 11/13/2016   Neuropathy involving both lower extremities 11/13/2016   Cystitis, radiation 05/22/2014   Cervical  cancer (HCC) 09/01/2013   Adenocarcinoma of cervix, 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   Asthma, mild intermittent 07/07/2009   INSOMNIA 05/28/2008   Generalized anxiety disorder 05/07/2008   HYPERTENSION, BENIGN 05/31/2007    PCP: Mickel Alanis, MD  REFERRING PROVIDER: Merriam Abbey, DO   REFERRING DIAG:  [phone] (ICD-10-CM) - Bilateral leg weakness G62.89 (ICD-10-CM) - Other polyneuropathy 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  Eval: Right leg worse than left but both painful. Right has instances of knee just giving out, has neuropathy - like weakness, pain since radiation in 2015. The nerves have been affected and greatly limiting her activity. Does have symptoms of numbness, sensory deficits, weakness, ataxic gait, and painful but all vary based on the day.     Fluid intake: Yes: water     PAIN:  Are you having pain? Yes NPRS scale: 0/10 Pain location: bil legs Rt>Lt  Pain type: aching Pain description: constant   Aggravating factors: walking, end of day, rainy days Relieving factors: try not to walk, decreasing activities    PRECAUTIONS: Other: cervical cancer with radiation  RED FLAGS: None   WEIGHT BEARING RESTRICTIONS: No  FALLS:  Has patient fallen in last 6 months? Yes. Number of falls 1x/weekly due to weakness and decreased balance from having the radiation, trouble with low light  -  reports she does need to furniture walk to help with balance  LIVING ENVIRONMENT: Lives with: lives with their family  OCCUPATION: sits with laptop on her lap for 12 hours per day; unable to sit with legs down due to them falling asleep  PLOF: Independent  PATIENT GOALS: to strengthening enough to walk across grass, wants to have better balance, stronger legs                                        PERTINENT HISTORY:  ADHD; Cervical Cancer 2014; 9 weeks of radiation; Radical hysterectomy;    OBJECTIVE:  Note: Objective measures were completed at Evaluation unless otherwise noted.  DIAGNOSTIC FINDINGS:   PATIENT SURVEYS:  Lower Extremity Functional Score: 5 / 80 = 6.3 %  COGNITION: Overall cognitive status: Within functional limits for tasks assessed     SENSATION: Poor sensation at Rt LE - light touch felt along posterior gastroc only no awareness without visualization of being touched at medial, lateral, anterior lower leg, reports numbness in bil feet Rt>Lt  EDEMA:  Rt ankle sometimes swells per pt  MUSCLE LENGTH: Bil hamstrings and adductors limited by 25%;  POSTURE: rounded shoulders and forward head  LOWER EXTREMITY ROM:  weakness and poor coordination/proprioception   LOWER EXTREMITY MMT:  MMT Right Eval */5 Left Eval */5  Hip flexion 2 3  Hip extension 2 3  Hip abduction 3 3+  Hip adduction 1+ 2+  Hip internal rotation    Hip external rotation    Knee flexion 2 3  Knee extension 2+ 3  Ankle dorsiflexion 0 4  Ankle plantarflexion 3 4  Ankle inversion 1 4  Ankle eversion 2 4   (Blank rows = not tested)   FUNCTIONAL TESTS:  Five times Sit to Stand Test (FTSS) Method: Use a straight back chair with a solid seat that is 16-18" high. Ask participant to sit on the chair with arms folded across their chest.   Instructions: "Stand up and sit down as quickly as possible 5 times, keeping your arms folded across your chest."   Measurement: Stop timing  when the participant stands the 5th time.  TIME: ______ (in seconds)  Times > 13.6 seconds is associated with increased disability and morbidity (Guralnik, 2000) Times > 15 seconds is predictive of recurrent falls in healthy individuals aged 68 and older (Buatois, et al., 2008) Normal  performance values in community dwelling individuals aged 45 and older (Bohannon, 2006): 50-59 years: 8 seconds 60-69 years: 11.4 seconds 70-79 years: 12.6 seconds 80-89 years: 14.8 seconds  MCID: >= 2.3 seconds for Vestibular Disorders (Meretta, 2006)  5 times sit to stand: 22s with hands - very unstable with poor hip, posture, core control  Timed up and go (TUG): 28s with SBQC - min A  GAIT: Distance walked: 200' Assistive device utilized: Quad cane small base Level of assistance: CGA Comments: Rt LE demonstrating ataxic pattern, Lt trunk lean intermittently  to right self, poor step height bil, decreased stride length, limited glute activation                                                                                                                                 TREATMENT DATE:   01/21/24 Nustep L5 x 5 min - used red band to ABD R thigh to assist with keeping foot on pedal Seated adductor squeeze with kegel contraction 5 sec hold 2x10 LAQ 2x10 B Seated clam yellow loop x 10 (difficult on R - cannot get full range) Seated unilateral clam - blue loop x 10 B Heel raises 2x10 straight and toes out Sit to stand to barre x 10 Supine heel slides x 10 B Bridge x 10   01/16/24: Examination completed, findings reviewed, pt educated on POC, HEP, and cues with pt directed in completing all exercises during session x5 reps for proper mechanics and safer compensation patterns. Pt motivated to participate in PT and agreeable to attempt recommendations.       PATIENT EDUCATION:  Education details: [id number] Person educated: Patient Education method: Explanation, Demonstration, Tactile cues, Verbal  cues, and Handouts Education comprehension: verbalized understanding, returned demonstration, verbal cues required, tactile cues required, and needs further education  HOME EXERCISE PROGRAM: Access Code: [email or web address] URL: [email or web address] Date: 01/21/2024 Prepared by: Concha Deed  Exercises - Supine Heel Slide with Strap  - 1 x daily - 7 x weekly - 3 sets - 10 reps - Pillow squeezes with kegel  - 1 x daily - 7 x weekly - 3 sets - 10 reps - Sit to Stand with Counter Support  - 1 x daily - 7 x weekly - 1 sets - 10 reps - Seated Long Arc Quad  - 1 x daily - 7 x weekly - 1-3 sets - 10 reps - 5 sec hold - Seated Isometric Hip Abduction with Resistance  - 1 x daily - 7 x weekly - 1-3 sets - 10 reps - Heel Raises with Counter Support  - 1 x daily - 7 x weekly - 1-3 sets - 10 reps  ASSESSMENT:  CLINICAL IMPRESSION: Patient demonstrates significant B weakness R>L.  Good tolerance to sitting, standing and resisted exercises and we were able to modify exercises as needed to encourage maximal engagement.    Eval: Patient is a 58 y.o. female  who was seen today for  physical therapy evaluation and treatment for poor gait mechanics, impaired posture, impaired balance, decreased hip and core strength, increased fall risk. Pt has complex history of  Radiation-induced lumbosacral plexopathy and Polyradiculoneuropathy status post cervical cancer. Pt has completed pelvic floor PT recently and had positive outcomes with this and resolution of symptoms. Pt very motivated to participate in PT for bil LE strengthening and decreased fall risk. Pt demonstrated impairments with objective measurements of TUG and 5xSTS indicative of increased fall risk and balance deficits. Pt reports she is unable to hold and carry grandchildren, to walk on uneven surfaces, needs assistance with dressing, walking, transfers sometimes and wants to be more I. Pt would benefit from additional PT to further address deficits.     OBJECTIVE IMPAIRMENTS: Abnormal gait, decreased activity tolerance, decreased coordination, decreased endurance, decreased mobility, difficulty walking, decreased strength, increased fascial restrictions, increased muscle spasms, impaired flexibility, impaired sensation, improper body mechanics, postural dysfunction, and pain.   ACTIVITY LIMITATIONS: carrying, lifting, bending, sitting, standing, squatting, stairs, transfers, bed mobility, locomotion level, and caring for others  PARTICIPATION LIMITATIONS: meal prep, cleaning, laundry, interpersonal relationship, driving, shopping, community activity, occupation, and yard work  PERSONAL FACTORS: Fitness, Past/current experiences, Time since onset of injury/illness/exacerbation, and 1 comorbidity: medical history  are also affecting patient's functional outcome.   REHAB POTENTIAL: Good  CLINICAL DECISION MAKING: Evolving/moderate complexity  EVALUATION COMPLEXITY: Moderate   GOALS: Goals reviewed with patient? Yes  SHORT TERM GOALS: Target date: 02/13/24 Pt to be I with HEP for carry over and continuing recommendations for improved outcomes.   Baseline: Goal status: INITIAL  2.  Pt to demonstrate at least 3/5 Rt LE strength grossly and 4/5 Lt for improved pelvic stability and functional squats without falling.  Baseline:  Goal status: INITIAL  3.  Pt to demonstrate ability to complete static standing without AD for at least 5 mins at midline to complete gentle kitchen tasks/household tasks without fear of falling.  Baseline:  Goal status: INITIAL  4.  Pt to demonstrate I with dynamic sitting balance to better be able to reach floor level for retrieving fallen objects and putting on socks without falling Baseline:  Goal status: INITIAL  LONG TERM GOALS: Target date: 07/17/24  Pt to be I with advanced HEP for carry over and continuing recommendations for improved outcomes.   Baseline:  Goal status: INITIAL  2.  Pt to report  improved LEFS score to at least 30/80 for improved I with functional tasks at home such as dressing, walking from room to room.  Baseline:  Goal status: INITIAL  3.  Pt to demonstrate improved dynamic standing balance with or without AD at no more than CGA to better be able to play with grandchildren without falling.  Baseline:  Goal status: INITIAL  4.  Pt to demonstrate improved gait mechanics with LRAD for 150' with minimal compensate strategies to I with pt safety with ambulating into a store.  Baseline:  Goal status: INITIAL  5.  Pt to demonstrate ability to perform x5 Sit to stand with at least 10# to more safely assist in caring for grandchildren without risk of falling.  Baseline:  Goal status: INITIAL    PLAN:  PT FREQUENCY: 2x/week  PT DURATION: 12 weeks  PLANNED INTERVENTIONS: 97110-Therapeutic exercises, 97530- Therapeutic activity, [id number]- Neuromuscular re-education, 97535- Self Care, 16109- Manual therapy, [id number]- Gait training, [id number]- Canalith repositioning, [id number]- Aquatic Therapy, 97760- Splinting, [id number]- Electrical stimulation (manual), [id number]- Vasopneumatic device, [id number]- Ultrasound, [id number]- Traction (mechanical), [id number]- Ionotophoresis 4mg /ml  Dexamethasone , Patient/Family education, Balance training, Stair training, Taping, Dry Needling, Joint mobilization, Joint manipulation, Spinal manipulation, Spinal mobilization, Scar mobilization, DME instructions, Wheelchair mobility training, Cryotherapy, Moist heat, and Biofeedback   Jinx Mourning, PT  04/28/251:38 PM

## 2024-01-23 ENCOUNTER — Other Ambulatory Visit: Payer: Self-pay | Admitting: Family Medicine

## 2024-01-23 DIAGNOSIS — J4520 Mild intermittent asthma, uncomplicated: Secondary | ICD-10-CM

## 2024-01-24 NOTE — Therapy (Signed)
 OUTPATIENT PHYSICAL THERAPY FEMALE PELVIC TREATMENT   Patient Name: Anna Ramsey MRN: [id number] DOB:Nov 04, 1965, 58 y.o., female Today's Date: 01/25/2024  END OF SESSION:  PT End of Session - 01/25/24 0849     Visit Number 3    Date for PT Re-Evaluation 07/17/24    Authorization Type [employer]    PT Start Time 0847    PT Stop Time 0928    PT Time Calculation (min) 41 min    Activity Tolerance Patient tolerated treatment well    Behavior During Therapy [facility] for tasks assessed/performed               Past Medical History:  Diagnosis Date   ADHD (attention deficit hyperactivity disorder) 2024   Dysuria    Erythematous bladder mucosa    Heart murmur    asymptomatic per pt --  no echo   Hematuria    History of cervical cancer    12/ 2014  Stage IB2--  s/p  TAH w/ BSO and pelvic lymphadectomy/  and radiation therapy   History of radiation therapy 10/20/13-11/28/13   pelvis 50.4 gray   Hypertension    Lesion of bladder    Mild intermittent asthma    Neuromuscular disorder (HCC)    Seasonal allergies    Thyroid disease    Past Surgical History:  Procedure Laterality Date   CYSTOSCOPY WITH BIOPSY N/A 04/29/2015   Procedure: CYSTOSCOPY WITH BIOPSY WITH FULGERATION;  Surgeon: Marco Severs, MD;  Location: [facility];  Service: Urology;  Laterality: N/A;  1 HR [phone] [id number]   FOOT SURGERY Left 2010   RADICAL ABDOMINAL HYSTERECTOMY  09-05-2013   w/ BILATERAL SALPINGOOPHORECTOMY AND PELVIC LYMPHADECTOMY   Patient Active Problem List   Diagnosis Date Noted   SUI (stress urinary incontinence, female) 11/01/2023   History of urinary retention 11/01/2023   Neurogenic bladder 10/18/2023   Hematuria 10/18/2023   Acute urinary retention 10/18/2023   Preventive measure 12/20/2022   Stress and adjustment reaction 01/13/2021   Inattention 10/28/2019   Hypothyroid 11/13/2016   Neuropathy involving both lower extremities 11/13/2016    Cystitis, radiation 05/22/2014   Cervical cancer (HCC) 09/01/2013   Adenocarcinoma of cervix, 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   Asthma, mild intermittent 07/07/2009   INSOMNIA 05/28/2008   Generalized anxiety disorder 05/07/2008   HYPERTENSION, BENIGN 05/31/2007    PCP: Mickel Alanis, MD  REFERRING PROVIDER: Merriam Abbey, DO   REFERRING DIAG:  [phone] (ICD-10-CM) - Bilateral leg weakness G62.89 (ICD-10-CM) - 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 No trouble walking when I went into work this week beyond the norm. No hard falls this week. Pivoting is the biggest problem. My body does, but the right hip doesn't and I lose my balance.   Eval: Right leg worse than left but both painful. Right has instances of knee just giving out, has neuropathy - like weakness, pain since radiation in 2015. The nerves have been affected and greatly limiting her activity. Does have symptoms of numbness, sensory deficits, weakness, ataxic gait, and painful but all vary based on the day.     Fluid intake: Yes: water     PAIN:  Are you having pain? Yes NPRS scale: 0/10 Pain location: bil legs Rt>Lt  Pain type: aching Pain description: constant   Aggravating factors: walking, end of day, rainy days Relieving factors: try not to walk, decreasing activities    PRECAUTIONS: Other: cervical cancer with radiation  RED  FLAGS: None   WEIGHT BEARING RESTRICTIONS: No  FALLS:  Has patient fallen in last 6 months? Yes. 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  DIAGNOSTIC FINDINGS:   PATIENT SURVEYS:  Lower Extremity Functional Score: 5 / 80 = 6.3 %  COGNITION: Overall cognitive status: Within functional limits for tasks assessed     SENSATION: Poor sensation at Rt LE - light touch felt along posterior gastroc only no awareness without visualization of being touched at medial, lateral, anterior lower leg, reports numbness in bil feet Rt>Lt  EDEMA:  Rt ankle sometimes swells per pt  MUSCLE LENGTH: Bil hamstrings and adductors limited by 25%;  POSTURE: rounded shoulders and forward head  LOWER EXTREMITY ROM:  weakness and poor coordination/proprioception   LOWER EXTREMITY MMT:  MMT Right Eval */5 Left Eval */5  Hip flexion 2 3  Hip extension 2 3  Hip abduction 3 3+  Hip adduction 1+ 2+  Hip internal rotation    Hip external rotation    Knee flexion 2 3  Knee extension 2+ 3  Ankle dorsiflexion 0 4  Ankle plantarflexion 3 4  Ankle inversion 1 4  Ankle eversion 2 4   (Blank rows = not tested)   FUNCTIONAL TESTS:  Five times Sit to Stand Test (FTSS) Method: Use a straight back chair with a solid seat that is 16-18" high.  Ask participant to sit on the chair with arms folded across their chest.   Instructions: "Stand up and sit down as quickly as possible 5 times, keeping your arms folded across your chest."   Measurement: Stop timing when the participant stands the 5th time.  TIME: ______ (in seconds)  Times > 13.6  seconds is associated with increased disability and morbidity (Guralnik, 2000) Times > 15 seconds is predictive of recurrent falls in healthy individuals aged 57 and older (Buatois, et al., 2008) Normal performance values in community dwelling individuals aged 12 and older (Bohannon, 2006): 50-59 years: 8 seconds 60-69 years: 11.4 seconds 70-79 years: 12.6 seconds 80-89 years: 14.8 seconds  MCID: >= 2.3 seconds for Vestibular Disorders (Meretta, 2006)  5 times sit to stand: 22s with hands - very unstable with poor hip, posture, core control  Timed up and go (TUG): 28s with SBQC - min A  GAIT: Distance walked: 200' Assistive device utilized: Quad cane small base Level of assistance: CGA Comments: Rt LE demonstrating ataxic pattern, Lt trunk lean intermittently  to right self, poor step height bil, decreased stride length, limited glute activation                                                                                                                                 TREATMENT DATE:   01/25/24 Nustep L1 x 6 min - used Nustep with foot straps to hold feet in Sit to stand to barre x 10 - yellow loop around thighs to activate glutes Functional squat to chair + foam pad with yellow band at barre  LAQ 2x10 B Seated unilateral clam - blue loop x 10 B then together x 10 Seated adductor squeeze with kegel contraction 5 sec hold 2x10 Heel raises 2x10 straight and toes out Standing HS curl x 10 B TKE into ball R 5 sec hold x 10    01/21/24 Nustep L5 x 5 min - used red band to ABD R thigh to assist with keeping foot on pedal Seated adductor squeeze with kegel contraction 5 sec hold  2x10 LAQ 2x10 B Seated clam yellow loop x 10 (difficult on R - cannot get full range) Seated unilateral clam - blue loop x 10 B Heel raises 2x10 straight and toes out Sit to stand to barre x 10 Supine heel slides x 10 B Bridge x 10   01/16/24: Examination completed, findings reviewed, pt educated on POC, HEP, and cues with pt directed in completing all exercises during session x5 reps for proper mechanics and safer compensation patterns. Pt motivated to participate in PT and agreeable to attempt recommendations.       PATIENT EDUCATION:  Education details: [id number] - uses App Person educated: Patient Education method: Explanation, Demonstration, Tactile cues, Verbal cues, and Handouts Education comprehension: verbalized understanding, returned demonstration, verbal cues required, tactile cues required, and needs further education  HOME EXERCISE PROGRAM: Access Code: [email or web address] URL: [email or web address] Date: 01/25/2024 Prepared by: Concha Deed  Exercises - Supine Heel Slide with Strap  - 1 x daily - 7 x weekly - 3 sets - 10 reps - Pillow squeezes with kegel  - 1 x daily - 7 x weekly - 3 sets - 10 reps - Sit to Stand with Counter Support  -  1 x daily - 7 x weekly - 1 sets - 10 reps - Seated Long Arc Quad  - 1 x daily - 7 x weekly - 1-3 sets - 10 reps - 5 sec hold - Seated Isometric Hip Abduction with Resistance  - 1 x daily - 7 x weekly - 1-3 sets - 10 reps - Heel Raises with Counter Support  - 1 x daily - 7 x weekly - 1-3 sets - 10 reps - Standing Terminal Knee Extension at Wall with Ball  - 2 x daily - 7 x weekly - 2 sets - 10 reps - 5 sec hold - Squat with Chair Touch and Resistance Loop  - 1 x daily - 3 x weekly - 2 sets - 10 reps - Standing Knee Flexion with Counter Support  - 1 x daily - 3 x weekly - 2 sets - 10 reps  ASSESSMENT:  CLINICAL IMPRESSION: Good tolerance to all exercises today with improved engagement of gluteals with seated hip ABD.  Able to do the  Nustep using straps on her feet without difficulty today aside from R hip ADDuction. Squats and sit to stands are still very challenging. HEP updated.  Eval: Patient is a 58 y.o. female  who was seen today for physical therapy evaluation and treatment for poor gait mechanics, impaired posture, impaired balance, decreased hip and core strength, increased fall risk. Pt has complex history of  Radiation-induced lumbosacral plexopathy and Polyradiculoneuropathy status post cervical cancer. Pt has completed pelvic floor PT recently and had positive outcomes with this and resolution of symptoms. Pt very motivated to participate in PT for bil LE strengthening and decreased fall risk. Pt demonstrated impairments with objective measurements of TUG and 5xSTS indicative of increased fall risk and balance deficits. Pt reports she is unable to hold and carry grandchildren, to walk on uneven surfaces, needs assistance with dressing, walking, transfers sometimes and wants to be more I. Pt would benefit from additional PT to further address deficits.    OBJECTIVE IMPAIRMENTS: Abnormal gait, decreased activity tolerance, decreased coordination, decreased endurance, decreased mobility, difficulty walking, decreased strength, increased fascial restrictions, increased muscle spasms, impaired flexibility, impaired sensation, improper body mechanics, postural dysfunction, and pain.   ACTIVITY LIMITATIONS: carrying, lifting, bending, sitting, standing, squatting, stairs, transfers, bed mobility, locomotion level, and caring for others  PARTICIPATION LIMITATIONS: meal prep, cleaning, laundry, interpersonal relationship, driving, shopping, community activity, occupation, and yard work  PERSONAL FACTORS: Fitness, Past/current experiences, Time since onset of injury/illness/exacerbation, and 1 comorbidity: medical history  are also affecting patient's functional outcome.   REHAB POTENTIAL: Good  CLINICAL DECISION MAKING:  Evolving/moderate complexity  EVALUATION COMPLEXITY: Moderate   GOALS: Goals reviewed with patient? Yes  SHORT TERM GOALS: Target date: 02/13/24 Pt to be I with HEP for carry over and continuing recommendations for improved outcomes.   Baseline: Goal status: INITIAL  2.  Pt to demonstrate at least 3/5 Rt LE strength grossly and 4/5 Lt for improved pelvic stability and functional squats without falling.  Baseline:  Goal status: INITIAL  3.  Pt to demonstrate ability to complete static standing without AD for at least 5 mins at midline to complete gentle kitchen tasks/household tasks without fear of falling.  Baseline:  Goal status: INITIAL  4.  Pt to demonstrate I with dynamic sitting balance to better be able to reach floor level for retrieving fallen objects and putting on socks without falling Baseline:  Goal status: INITIAL  LONG TERM GOALS: Target date: 07/17/24  Pt to be I with advanced HEP for carry over and continuing recommendations for improved outcomes.   Baseline:  Goal status: INITIAL  2.  Pt to report improved LEFS score to at least 30/80 for improved I with functional tasks at home such as dressing, walking from room to room.  Baseline:  Goal status: INITIAL  3.  Pt to demonstrate improved dynamic standing balance with or without AD at no more than CGA to better be able to play with grandchildren without falling.  Baseline:  Goal status: INITIAL  4.  Pt to demonstrate improved gait mechanics with LRAD for 150' with minimal compensate strategies to I with pt safety with ambulating into a store.  Baseline:  Goal status: INITIAL  5.  Pt to demonstrate ability to perform x5 Sit to stand with at least 10# to more safely assist in caring for grandchildren without risk of falling.  Baseline:  Goal status: INITIAL    PLAN:  PT FREQUENCY: 2x/week  PT DURATION: 12 weeks  PLANNED INTERVENTIONS: 97110-Therapeutic exercises, 97530- Therapeutic activity, [id number]-  Neuromuscular re-education, 97535- Self Care, 82956- Manual therapy, [phone]- Gait training, [phone]- Canalith repositioning, [id number]- Aquatic Therapy, [phone]- Splinting, [id number]- Electrical stimulation (manual), [id number]- Vasopneumatic device, [id number]- Ultrasound, [id number]- Traction (mechanical), [id number]- Ionotophoresis 4mg /ml Dexamethasone , Patient/Family education, Balance training, Stair training, Taping, Dry Needling, Joint mobilization, Joint manipulation, Spinal manipulation, Spinal mobilization, Scar mobilization, DME instructions, Wheelchair mobility training, Cryotherapy, Moist heat, and Biofeedback   Jinx Mourning, PT  01/25/2511:02 PM

## 2024-01-25 ENCOUNTER — Encounter: Payer: Self-pay | Admitting: Physical Therapy

## 2024-01-25 ENCOUNTER — Ambulatory Visit: Attending: Neurology | Admitting: Physical Therapy

## 2024-01-25 DIAGNOSIS — R102 Pelvic and perineal pain: Secondary | ICD-10-CM | POA: Diagnosis present

## 2024-01-25 DIAGNOSIS — R293 Abnormal posture: Secondary | ICD-10-CM | POA: Insufficient documentation

## 2024-01-25 DIAGNOSIS — R269 Unspecified abnormalities of gait and mobility: Secondary | ICD-10-CM | POA: Diagnosis present

## 2024-01-25 DIAGNOSIS — M6281 Muscle weakness (generalized): Secondary | ICD-10-CM | POA: Insufficient documentation

## 2024-01-25 DIAGNOSIS — R279 Unspecified lack of coordination: Secondary | ICD-10-CM | POA: Diagnosis present

## 2024-01-25 DIAGNOSIS — M62838 Other muscle spasm: Secondary | ICD-10-CM | POA: Diagnosis present

## 2024-01-29 ENCOUNTER — Ambulatory Visit: Admitting: Physical Therapy

## 2024-01-29 DIAGNOSIS — R293 Abnormal posture: Secondary | ICD-10-CM

## 2024-01-29 DIAGNOSIS — M6281 Muscle weakness (generalized): Secondary | ICD-10-CM | POA: Diagnosis not present

## 2024-01-29 DIAGNOSIS — R269 Unspecified abnormalities of gait and mobility: Secondary | ICD-10-CM

## 2024-01-29 NOTE — Therapy (Signed)
 OUTPATIENT PHYSICAL THERAPY FEMALE PELVIC TREATMENT   Patient Name: Anna Ramsey MRN: [id number] DOB:22-Mar-1966, 58 y.o., female Today's Date: 01/29/2024  END OF SESSION:  PT End of Session - 01/29/24 0835     Visit Number 4    Date for PT Re-Evaluation 07/17/24    Authorization Type [employer]    PT Start Time [phone]    PT Stop Time 0920    PT Time Calculation (min) 44 min    Activity Tolerance Patient tolerated treatment well               Past Medical History:  Diagnosis Date   ADHD (attention deficit hyperactivity disorder) 2024   Dysuria    Erythematous bladder mucosa    Heart murmur    asymptomatic per pt --  no echo   Hematuria    History of cervical cancer    12/ 2014  Stage IB2--  s/p  TAH w/ BSO and pelvic lymphadectomy/  and radiation therapy   History of radiation therapy 10/20/13-11/28/13   pelvis 50.4 gray   Hypertension    Lesion of bladder    Mild intermittent asthma    Neuromuscular disorder (HCC)    Seasonal allergies    Thyroid disease    Past Surgical History:  Procedure Laterality Date   CYSTOSCOPY WITH BIOPSY N/A 04/29/2015   Procedure: CYSTOSCOPY WITH BIOPSY WITH FULGERATION;  Surgeon: Marco Severs, MD;  Location: [facility];  Service: Urology;  Laterality: N/A;  1 HR [phone] [id number]   FOOT SURGERY Left 2010   RADICAL ABDOMINAL HYSTERECTOMY  09-05-2013   w/ BILATERAL SALPINGOOPHORECTOMY AND PELVIC LYMPHADECTOMY   Patient Active Problem List   Diagnosis Date Noted   SUI (stress urinary incontinence, female) 11/01/2023   History of urinary retention 11/01/2023   Neurogenic bladder 10/18/2023   Hematuria 10/18/2023   Acute urinary retention 10/18/2023   Preventive measure 12/20/2022   Stress and adjustment reaction 01/13/2021   Inattention 10/28/2019   Hypothyroid 11/13/2016   Neuropathy involving both lower extremities 11/13/2016   Cystitis, radiation 05/22/2014   Cervical cancer (HCC) 09/01/2013    Adenocarcinoma of cervix, 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   Asthma, mild intermittent 07/07/2009   INSOMNIA 05/28/2008   Generalized anxiety disorder 05/07/2008   HYPERTENSION, BENIGN 05/31/2007    PCP: Mickel Alanis, MD  REFERRING PROVIDER: Merriam Abbey, DO   REFERRING DIAG:  [phone] (ICD-10-CM) - Bilateral leg weakness G62.89 (ICD-10-CM) - 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  This works different muscles than pelvic PT.  I can feel the glutes when I squench them now. I have some discomfort in the mornings, it's like your body is full of sand but some days it gets better.    Eval: Right leg worse than left but both painful. Right has instances of knee just giving out, has neuropathy - like weakness, pain since radiation in 2015. The nerves have been affected and greatly limiting her activity. Does have symptoms of numbness, sensory deficits, weakness, ataxic gait, and painful but all vary based on the day.     Fluid intake: Yes: water     PAIN:  Are you having pain? no NPRS scale: 0/10 Pain location: bil legs Rt>Lt  Pain type: aching Pain description: constant   Aggravating factors: walking, end of day, rainy days Relieving factors: try not to walk, decreasing activities    PRECAUTIONS: Other: cervical cancer with radiation  RED FLAGS: None   WEIGHT BEARING RESTRICTIONS: No  FALLS:  Has patient fallen in last 6 months? Yes. Number of falls 1x/weekly  due to weakness and decreased balance from having the radiation, trouble with low light  - reports she does need to furniture walk to help with balance  LIVING ENVIRONMENT: Lives with: lives with their family  OCCUPATION: sits with laptop on her lap for 12 hours per day; unable to sit with legs down due to them falling asleep  PLOF: Independent  PATIENT GOALS: to strengthening enough to walk across grass, wants to have better balance, stronger legs                                        PERTINENT HISTORY:  ADHD; Cervical Cancer 2014; 9 weeks of radiation; Radical hysterectomy;    OBJECTIVE:  Note: Objective measures were completed at Evaluation unless otherwise noted.  DIAGNOSTIC FINDINGS:   PATIENT SURVEYS:  Lower Extremity Functional Score: 5 / 80 = 6.3 %  COGNITION: Overall cognitive status: Within functional limits for tasks assessed     SENSATION: Poor sensation at Rt LE - light touch felt along posterior gastroc only no awareness without visualization of being touched at medial, lateral, anterior lower leg, reports numbness in bil feet Rt>Lt  EDEMA:  Rt ankle sometimes swells per pt  MUSCLE LENGTH: Bil hamstrings and adductors limited by 25%;  POSTURE: rounded shoulders and forward head  LOWER EXTREMITY ROM:  weakness and poor coordination/proprioception   LOWER EXTREMITY MMT:  MMT Right Eval */5 Left Eval */5  Hip flexion 2 3  Hip extension 2 3  Hip abduction 3 3+  Hip adduction 1+ 2+  Hip internal rotation    Hip external rotation    Knee flexion 2 3  Knee extension 2+ 3  Ankle dorsiflexion 0 4  Ankle plantarflexion 3 4  Ankle inversion 1 4  Ankle eversion 2 4   (Blank rows = not tested)   FUNCTIONAL TESTS:  Five times Sit to Stand Test (FTSS) Method: Use a straight back chair with a solid seat that is 16-18" high. Ask participant to sit on the chair with arms folded across their chest.   Instructions: "Stand up and sit down as quickly as  possible 5 times, keeping your arms folded across your chest."   Measurement: Stop timing when the participant stands the 5th time.  TIME: ______ (in seconds)  Times > 13.6 seconds is associated with increased disability and morbidity (Guralnik, 2000) Times > 15 seconds is predictive of recurrent falls in healthy individuals aged 49 and older (Buatois, et al., 2008) Normal performance values in community dwelling individuals aged 56 and older (Bohannon, 2006): 50-59 years: 8 seconds 60-69 years: 11.4 seconds 70-79 years: 12.6 seconds 80-89 years: 14.8 seconds  MCID: >= 2.3 seconds for Vestibular Disorders (Meretta, 2006)  5 times sit to stand: 22s with hands - very unstable with poor hip, posture, core control  Timed up and go (TUG): 28s with SBQC - min A  GAIT: Distance walked: 200' Assistive device utilized: Quad cane small base Level of assistance: CGA Comments: Rt LE demonstrating ataxic pattern, Lt trunk lean intermittently  to right self, poor step height bil, decreased stride length, limited glute activation                                                                                                                                 TREATMENT DATE:  01/29/24 Nustep seat 4 L1 x 6 min - used blue Nustep with foot straps to hold feet in Floor slider hip abduction 8x right/left (initially lacks motor control but improves with repetition) Standing holding to barre: pelvis press forward to activate hip and trunk extensors against purple heavy power band anchored by therapist 15x Holding to barre with single leg modified RDL to touch stool 10x right/left (compensatory knee hyperextension)  Seated unilateral clam - blue loop x 10 B then together x 10 Seated leg press isometric into red physioball 10x 5 sec hold right/left Sit to stand to barre x 10 - blue loop around thighs to activate glutes 10x RPE 4/10 Functional squat to chair + foam pad with blue loop around thighs at barre  10x   01/25/24 Nustep L1 x 6 min - used Nustep with foot straps to hold feet in Sit to stand to barre x 10 - yellow loop around thighs to activate glutes Functional squat to chair + foam pad with yellow band at barre  LAQ 2x10 B Seated unilateral clam - blue loop x 10 B then together x 10 Seated adductor squeeze with kegel contraction 5 sec hold 2x10 Heel raises 2x10 straight and toes out Standing HS curl x 10 B TKE into ball R 5 sec hold x 10    01/21/24 Nustep L5 x 5 min - used red band to ABD R thigh to assist with keeping foot on pedal Seated adductor squeeze with kegel contraction 5 sec hold 2x10 LAQ 2x10 B Seated clam yellow loop x 10 (difficult on R - cannot get full range) Seated unilateral clam - blue loop x 10 B Heel raises 2x10 straight and toes out Sit to stand to barre x 10 Supine heel slides x 10 B Bridge x 10   01/16/24: Examination completed, findings reviewed, pt educated on POC, HEP, and cues with pt directed in completing  all exercises during session x5 reps for proper mechanics and safer compensation patterns. Pt motivated to participate in PT and agreeable to attempt recommendations.       PATIENT EDUCATION:  Education details: [id number] - uses App Person educated: Patient Education method: Explanation, Demonstration, Tactile cues, Verbal cues, and Handouts Education comprehension: verbalized understanding, returned demonstration, verbal cues required, tactile cues required, and needs further education  HOME EXERCISE PROGRAM: Access Code: [email or web address] URL: [email or web address] Date: 01/25/2024 Prepared by: Concha Deed  Exercises - Supine Heel Slide with Strap  - 1 x daily - 7 x weekly - 3 sets - 10 reps - Pillow squeezes with kegel  - 1 x daily - 7 x weekly - 3 sets - 10 reps - Sit to Stand with Counter Support  - 1 x daily - 7 x weekly - 1 sets - 10 reps - Seated Long Arc Quad  - 1 x daily - 7 x weekly - 1-3 sets - 10 reps - 5 sec hold - Seated  Isometric Hip Abduction with Resistance  - 1 x daily - 7 x weekly - 1-3 sets - 10 reps - Heel Raises with Counter Support  - 1 x daily - 7 x weekly - 1-3 sets - 10 reps - Standing Terminal Knee Extension at Wall with Ball  - 2 x daily - 7 x weekly - 2 sets - 10 reps - 5 sec hold - Squat with Chair Touch and Resistance Loop  - 1 x daily - 3 x weekly - 2 sets - 10 reps - Standing Knee Flexion with Counter Support  - 1 x daily - 3 x weekly - 2 sets - 10 reps  ASSESSMENT:  CLINICAL IMPRESSION: Verbal cues for patellofemoral alignment (tends to adduct/internally rotate).  Resistance around thighs helps with alignment as well.   Uses compensatory knee hyperextension in standing to stabilize.  She is hypermobile in general.  Close supervision and at times contact guard assist for safety secondary to decreased motor control making her at risk for falls.  She is highly motivated with rehab and reports improvement in muscle activation.     Eval: Patient is a 58 y.o. female  who was seen today for physical therapy evaluation and treatment for poor gait mechanics, impaired posture, impaired balance, decreased hip and core strength, increased fall risk. Pt has complex history of  Radiation-induced lumbosacral plexopathy and Polyradiculoneuropathy status post cervical cancer. Pt has completed pelvic floor PT recently and had positive outcomes with this and resolution of symptoms. Pt very motivated to participate in PT for bil LE strengthening and decreased fall risk. Pt demonstrated impairments with objective measurements of TUG and 5xSTS indicative of increased fall risk and balance deficits. Pt reports she is unable to hold and carry grandchildren, to walk on uneven surfaces, needs assistance with dressing, walking, transfers sometimes and wants to be more I. Pt would benefit from additional PT to further address deficits.    OBJECTIVE IMPAIRMENTS: Abnormal gait, decreased activity tolerance, decreased  coordination, decreased endurance, decreased mobility, difficulty walking, decreased strength, increased fascial restrictions, increased muscle spasms, impaired flexibility, impaired sensation, improper body mechanics, postural dysfunction, and pain.   ACTIVITY LIMITATIONS: carrying, lifting, bending, sitting, standing, squatting, stairs, transfers, bed mobility, locomotion level, and caring for others  PARTICIPATION LIMITATIONS: meal prep, cleaning, laundry, interpersonal relationship, driving, shopping, community activity, occupation, and yard work  PERSONAL FACTORS: Fitness, Past/current experiences, Time since onset of injury/illness/exacerbation, and 1 comorbidity: medical history  are also affecting patient's functional  outcome.   REHAB POTENTIAL: Good  CLINICAL DECISION MAKING: Evolving/moderate complexity  EVALUATION COMPLEXITY: Moderate   GOALS: Goals reviewed with patient? Yes  SHORT TERM GOALS: Target date: 02/13/24 Pt to be I with HEP for carry over and continuing recommendations for improved outcomes.   Baseline: Goal status: INITIAL  2.  Pt to demonstrate at least 3/5 Rt LE strength grossly and 4/5 Lt for improved pelvic stability and functional squats without falling.  Baseline:  Goal status: INITIAL  3.  Pt to demonstrate ability to complete static standing without AD for at least 5 mins at midline to complete gentle kitchen tasks/household tasks without fear of falling.  Baseline:  Goal status: INITIAL  4.  Pt to demonstrate I with dynamic sitting balance to better be able to reach floor level for retrieving fallen objects and putting on socks without falling Baseline:  Goal status: INITIAL  LONG TERM GOALS: Target date: 07/17/24  Pt to be I with advanced HEP for carry over and continuing recommendations for improved outcomes.   Baseline:  Goal status: INITIAL  2.  Pt to report improved LEFS score to at least 30/80 for improved I with functional tasks at home  such as dressing, walking from room to room.  Baseline:  Goal status: INITIAL  3.  Pt to demonstrate improved dynamic standing balance with or without AD at no more than CGA to better be able to play with grandchildren without falling.  Baseline:  Goal status: INITIAL  4.  Pt to demonstrate improved gait mechanics with LRAD for 150' with minimal compensate strategies to I with pt safety with ambulating into a store.  Baseline:  Goal status: INITIAL  5.  Pt to demonstrate ability to perform x5 Sit to stand with at least 10# to more safely assist in caring for grandchildren without risk of falling.  Baseline:  Goal status: INITIAL    PLAN:  PT FREQUENCY: 2x/week  PT DURATION: 12 weeks  PLANNED INTERVENTIONS: gluteal strengthening, quad control bilaterally  97110-Therapeutic exercises, 97530- Therapeutic activity, 97112- Neuromuscular re-education, 97535- Self Care, 69629- Manual therapy, [phone]- Gait training, [phone]- Canalith repositioning, [id number]- Aquatic Therapy, [phone]- Splinting, [id number]- Electrical stimulation (manual), [id number]- Vasopneumatic device, [id number]- Ultrasound, [id number]- Traction (mechanical), [id number]- Ionotophoresis 4mg /ml Dexamethasone , Patient/Family education, Balance training, Stair training, Taping, Dry Needling, Joint mobilization, Joint manipulation, Spinal manipulation, Spinal mobilization, Scar mobilization, DME instructions, Wheelchair mobility training, Cryotherapy, Moist heat, and Biofeedback  Darien Eden, PT 01/29/24 9:34 AM Phone: [phone] Fax: [phone]

## 2024-02-01 ENCOUNTER — Ambulatory Visit

## 2024-02-01 ENCOUNTER — Ambulatory Visit: Admitting: Physical Therapy

## 2024-02-01 DIAGNOSIS — M62838 Other muscle spasm: Secondary | ICD-10-CM

## 2024-02-01 DIAGNOSIS — M6281 Muscle weakness (generalized): Secondary | ICD-10-CM

## 2024-02-01 DIAGNOSIS — R293 Abnormal posture: Secondary | ICD-10-CM

## 2024-02-01 DIAGNOSIS — R269 Unspecified abnormalities of gait and mobility: Secondary | ICD-10-CM

## 2024-02-01 DIAGNOSIS — R279 Unspecified lack of coordination: Secondary | ICD-10-CM

## 2024-02-01 NOTE — Therapy (Signed)
 OUTPATIENT PHYSICAL THERAPY FEMALE PELVIC TREATMENT   Patient Name: EVANGLINE Ramsey MRN: [id number] DOB:1966-07-19, 58 y.o., female Today's Date: 02/01/2024  END OF SESSION:  PT End of Session - 02/01/24 0858     Visit Number 5    Date for PT Re-Evaluation 07/17/24    Authorization Type [employer]    PT Start Time 0900    PT Stop Time 0945    PT Time Calculation (min) 45 min    Activity Tolerance Patient tolerated treatment well    Behavior During Therapy [facility] for tasks assessed/performed               Past Medical History:  Diagnosis Date   ADHD (attention deficit hyperactivity disorder) 2024   Dysuria    Erythematous bladder mucosa    Heart murmur    asymptomatic per pt --  no echo   Hematuria    History of cervical cancer    12/ 2014  Stage IB2--  s/p  TAH w/ BSO and pelvic lymphadectomy/  and radiation therapy   History of radiation therapy 10/20/13-11/28/13   pelvis 50.4 gray   Hypertension    Lesion of bladder    Mild intermittent asthma    Neuromuscular disorder (HCC)    Seasonal allergies    Thyroid disease    Past Surgical History:  Procedure Laterality Date   CYSTOSCOPY WITH BIOPSY N/A 04/29/2015   Procedure: CYSTOSCOPY WITH BIOPSY WITH FULGERATION;  Surgeon: Marco Severs, MD;  Location: [facility];  Service: Urology;  Laterality: N/A;  1 HR [phone] [id number]   FOOT SURGERY Left 2010   RADICAL ABDOMINAL HYSTERECTOMY  09-05-2013   w/ BILATERAL SALPINGOOPHORECTOMY AND PELVIC LYMPHADECTOMY   Patient Active Problem List   Diagnosis Date Noted   SUI (stress urinary incontinence, female) 11/01/2023   History of urinary retention 11/01/2023   Neurogenic bladder 10/18/2023   Hematuria 10/18/2023   Acute urinary retention 10/18/2023   Preventive measure 12/20/2022   Stress and adjustment reaction 01/13/2021   Inattention 10/28/2019   Hypothyroid 11/13/2016   Neuropathy involving both lower extremities 11/13/2016    Cystitis, radiation 05/22/2014   Cervical cancer (HCC) 09/01/2013   Adenocarcinoma of cervix, 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   Asthma, mild intermittent 07/07/2009   INSOMNIA 05/28/2008   Generalized anxiety disorder 05/07/2008   HYPERTENSION, BENIGN 05/31/2007    PCP: Mickel Alanis, MD  REFERRING PROVIDER: Merriam Abbey, DO   REFERRING DIAG:  [phone] (ICD-10-CM) - Bilateral leg weakness G62.89 (ICD-10-CM) - 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 My right leg is painful today. I think its the weather   Eval: Right leg worse than left but both painful. Right has instances of knee just giving out, has neuropathy - like weakness, pain since radiation in 2015. The nerves have been affected and greatly limiting her activity. Does have symptoms of numbness, sensory deficits, weakness, ataxic gait, and painful but all vary based on the day.     Fluid intake: Yes: water    PAIN:  Are you having pain? Yes NPRS scale:3/10 Pain location: bil legs Right leg  Pain type: aching Pain description: constant   Aggravating factors: walking, end of day, rainy days Relieving factors: try not to walk, decreasing activities    PRECAUTIONS: Other: cervical cancer with radiation  RED FLAGS: None   WEIGHT BEARING RESTRICTIONS: No  FALLS:  Has patient fallen in last 6 months? Yes. Number of falls 1x/weekly due to  weakness and decreased balance from having the radiation, trouble with low light - reports she does need to furniture walk to help with balance  LIVING ENVIRONMENT: Lives with: lives with their family  OCCUPATION: sits with laptop on her lap for 12 hours per day; unable to sit with legs down due to them falling asleep  PLOF: Independent  PATIENT GOALS: to strengthening enough to walk across grass, wants to have better balance, stronger legs                                        PERTINENT HISTORY:  ADHD; Cervical Cancer 2014; 9 weeks of radiation; Radical hysterectomy;    OBJECTIVE:  Note: Objective measures were completed at Evaluation unless otherwise noted.  DIAGNOSTIC FINDINGS:   PATIENT SURVEYS:  Lower Extremity Functional Score: 5 / 80 = 6.3 %  COGNITION: Overall cognitive status: Within functional limits for tasks assessed     SENSATION: Poor sensation at Rt LE - light touch felt along posterior gastroc only no awareness without visualization of being touched at medial, lateral, anterior lower leg, reports numbness in bil feet Rt>Lt  EDEMA:  Rt ankle sometimes swells per pt  MUSCLE LENGTH: Bil hamstrings and adductors limited by 25%;  POSTURE: rounded shoulders and forward head  LOWER EXTREMITY ROM:  weakness and poor coordination/proprioception   LOWER EXTREMITY MMT:  MMT Right Eval */5 Left Eval */5  Hip flexion 2 3  Hip extension 2 3  Hip abduction 3 3+  Hip adduction 1+ 2+  Hip internal rotation    Hip external rotation    Knee flexion 2 3  Knee extension 2+ 3  Ankle dorsiflexion 0 4  Ankle plantarflexion 3 4  Ankle inversion 1 4  Ankle eversion 2 4   (Blank rows = not tested)   FUNCTIONAL TESTS:  Five times Sit to Stand Test (FTSS) Method: Use a straight back chair with a solid seat that is 16-18" high. Ask participant to sit on the chair with arms folded across their chest.   Instructions: "Stand up and sit down as quickly as possible 5  times, keeping your arms folded across your chest."   Measurement: Stop timing when the participant stands the 5th time.  TIME: ______ (in seconds)  Times > 13.6 seconds is associated with increased disability and morbidity (Guralnik, 2000) Times > 15 seconds is predictive of recurrent falls in healthy individuals aged 36 and older (Buatois,  et al., 2008) Normal performance values in community dwelling individuals aged 92 and older (Bohannon, 2006): 50-59 years: 8 seconds 60-69 years: 11.4 seconds 70-79 years: 12.6 seconds 80-89 years: 14.8 seconds  MCID: >= 2.3 seconds for Vestibular Disorders (Meretta, 2006)  5 times sit to stand: 22s with hands - very unstable with poor hip, posture, core control  Timed up and go (TUG): 28s with SBQC - min A  GAIT: Distance walked: 200' Assistive device utilized: Quad cane small base Level of assistance: CGA Comments: Rt LE demonstrating ataxic pattern, Lt trunk lean intermittently  to right self, poor step height bil, decreased stride length, limited glute activation                                                                                                                                 TREATMENT DATE:  02/01/2024 VC's and TC's throughout to prevent compensations Nu step seat 3,UE 9, Lev 5 x 6 min, therapist assisting hip to maintain neutral Heel raises 2 x 10 feet straight and toe out HS curls standing x15 TKE red B x 10 Sit to stand to barre x 15 - blue loop around thighs to activate glutes, TC to encourage WB through right LE LAQ 2x10 B Seated unilateral clam - blue loop x 10 B then together x 10 Seated adductor squeeze with kegel contraction 5 sec hold 2x10 Heel raises 2x10 straight and toes out Standing HS curl x 10 B TKE with red TB standing x 10 ea  01/29/24 Nustep seat 4 L1 x 6 min - used blue Nustep with foot straps to hold feet in Floor slider hip abduction 8x right/left (initially lacks motor control but improves with  repetition) Standing holding to barre: pelvis press forward to activate hip and trunk extensors against purple heavy power band anchored by therapist 15x Holding to barre with single leg modified RDL to touch stool 10x right/left (compensatory knee hyperextension)  Seated unilateral clam - blue loop x 10 B then together x 10 Seated leg press isometric into red physioball 10x 5 sec hold right/left Sit to stand to barre x 10 - blue loop around thighs to activate glutes 10x RPE 4/10 Functional squat to chair + foam pad with blue loop around thighs at barre 10x   01/25/24 Nustep L1 x 6 min - used Nustep with foot straps to hold feet in Sit to stand to barre x 10 - yellow loop around thighs to activate glutes Functional squat to chair + foam pad with yellow band at barre  LAQ 2x10 B Seated unilateral clam - blue loop x 10 B then together x 10 Seated adductor squeeze with kegel contraction 5 sec hold 2x10 Heel raises 2x10 straight and toes out Standing HS curl x 10 B TKE into ball R 5 sec hold x 10    01/21/24 Nustep L5 x 5 min - used red band to ABD R thigh to assist  with keeping foot on pedal Seated adductor squeeze with kegel contraction 5 sec hold 2x10 LAQ 2x10 B Seated clam yellow loop x 10 (difficult on R - cannot get full range) Seated unilateral clam - blue loop x 10 B Heel raises 2x10 straight and toes out Sit to stand to barre x 10 Supine heel slides x 10 B Bridge x 10   01/16/24: Examination completed, findings reviewed, pt educated on POC, HEP, and cues with pt directed in completing all exercises during session x5 reps for proper mechanics and safer compensation patterns. Pt motivated to participate in PT and agreeable to attempt recommendations.       PATIENT EDUCATION:  Education details: [id number] - uses App Person educated: Patient Education method: Explanation, Demonstration, Tactile cues, Verbal cues, and Handouts Education comprehension: verbalized understanding,  returned demonstration, verbal cues required, tactile cues required, and needs further education  HOME EXERCISE PROGRAM: Access Code: [email or web address] URL: [email or web address] Date: 01/25/2024 Prepared by: Concha Deed  Exercises - Supine Heel Slide with Strap  - 1 x daily - 7 x weekly - 3 sets - 10 reps - Pillow squeezes with kegel  - 1 x daily - 7 x weekly - 3 sets - 10 reps - Sit to Stand with Counter Support  - 1 x daily - 7 x weekly - 1 sets - 10 reps - Seated Long Arc Quad  - 1 x daily - 7 x weekly - 1-3 sets - 10 reps - 5 sec hold - Seated Isometric Hip Abduction with Resistance  - 1 x daily - 7 x weekly - 1-3 sets - 10 reps - Heel Raises with Counter Support  - 1 x daily - 7 x weekly - 1-3 sets - 10 reps - Standing Terminal Knee Extension at Wall with Ball  - 2 x daily - 7 x weekly - 2 sets - 10 reps - 5 sec hold - Squat with Chair Touch and Resistance Loop  - 1 x daily - 3 x weekly - 2 sets - 10 reps - Standing Knee Flexion with Counter Support  - 1 x daily - 3 x weekly - 2 sets - 10 reps  ASSESSMENT:  CLINICAL IMPRESSION:    Pt with significant right greater than left hip adduction and IR, which progressively improved after Nu Step and with attention to task. Needed focus with sit to stand to get WB into right LE. Therapist VC's and TC's throughout therapy to prevent compensations. Pt was walking much better at completion of therapy with improved hip alignment.   Eval: Patient is a 58 y.o. female  who was seen today for physical therapy evaluation and treatment for poor gait mechanics, impaired posture, impaired balance, decreased hip and core strength, increased fall risk. Pt has complex history of  Radiation-induced lumbosacral plexopathy and Polyradiculoneuropathy status post cervical cancer. Pt has completed pelvic floor PT recently and had positive outcomes with this and resolution of symptoms. Pt very motivated to participate in PT for bil LE strengthening and decreased  fall risk. Pt demonstrated impairments with objective measurements of TUG and 5xSTS indicative of increased fall risk and balance deficits. Pt reports she is unable to hold and carry grandchildren, to walk on uneven surfaces, needs assistance with dressing, walking, transfers sometimes and wants to be more I. Pt would benefit from additional PT to further address deficits.    OBJECTIVE IMPAIRMENTS: Abnormal gait, decreased activity tolerance, decreased coordination, decreased endurance, decreased mobility, difficulty walking, decreased strength, increased fascial restrictions, increased muscle  spasms, impaired flexibility, impaired sensation, improper body mechanics, postural dysfunction, and pain.   ACTIVITY LIMITATIONS: carrying, lifting, bending, sitting, standing, squatting, stairs, transfers, bed mobility, locomotion level, and caring for others  PARTICIPATION LIMITATIONS: meal prep, cleaning, laundry, interpersonal relationship, driving, shopping, community activity, occupation, and yard work  PERSONAL FACTORS: Fitness, Past/current experiences, Time since onset of injury/illness/exacerbation, and 1 comorbidity: medical history are also affecting patient's functional outcome.   REHAB POTENTIAL: Good  CLINICAL DECISION MAKING: Evolving/moderate complexity  EVALUATION COMPLEXITY: Moderate   GOALS: Goals reviewed with patient? Yes  SHORT TERM GOALS: Target date: 02/13/24 Pt to be I with HEP for carry over and continuing recommendations for improved outcomes.   Baseline: Goal status: INITIAL  2.  Pt to demonstrate at least 3/5 Rt LE strength grossly and 4/5 Lt for improved pelvic stability and functional squats without falling.  Baseline:  Goal status: INITIAL  3.  Pt to demonstrate ability to complete static standing without AD for at least 5 mins at midline to complete gentle kitchen tasks/household tasks without fear of falling.  Baseline:  Goal status: INITIAL  4.  Pt to  demonstrate I with dynamic sitting balance to better be able to reach floor level for retrieving fallen objects and putting on socks without falling Baseline:  Goal status: INITIAL  LONG TERM GOALS: Target date: 07/17/24  Pt to be I with advanced HEP for carry over and continuing recommendations for improved outcomes.   Baseline:  Goal status: INITIAL  2.  Pt to report improved LEFS score to at least 30/80 for improved I with functional tasks at home such as dressing, walking from room to room.  Baseline:  Goal status: INITIAL  3.  Pt to demonstrate improved dynamic standing balance with or without AD at no more than CGA to better be able to play with grandchildren without falling.  Baseline:  Goal status: INITIAL  4.  Pt to demonstrate improved gait mechanics with LRAD for 150' with minimal compensate strategies to I with pt safety with ambulating into a store.  Baseline:  Goal status: INITIAL  5.  Pt to demonstrate ability to perform x5 Sit to stand with at least 10# to more safely assist in caring for grandchildren without risk of falling.  Baseline:  Goal status: INITIAL    PLAN:  PT FREQUENCY: 2x/week  PT DURATION: 12 weeks  PLANNED INTERVENTIONS: gluteal strengthening, quad control bilaterally  97110-Therapeutic exercises, 97530- Therapeutic activity, 97112- Neuromuscular re-education, 97535- Self Care, 95621- Manual therapy, [phone]- Gait training, [phone]- Canalith repositioning, [id number]- Aquatic Therapy, [phone]- Splinting, [id number]- Electrical stimulation (manual), [id number]- Vasopneumatic device, [id number]- Ultrasound, [id number]- Traction (mechanical), [id number]- Ionotophoresis 4mg /ml Dexamethasone , Patient/Family education, Balance training, Stair training, Taping, Dry Needling, Joint mobilization, Joint manipulation, Spinal manipulation, Spinal mobilization, Scar mobilization, DME instructions, Wheelchair mobility training, Cryotherapy, Moist heat, and Biofeedback  Darien Eden, PT 02/01/24  10:04 AM Phone: [phone] Fax: [phone]

## 2024-02-03 NOTE — Therapy (Signed)
 OUTPATIENT PHYSICAL THERAPY FEMALE PELVIC TREATMENT   Patient Name: Anna Ramsey MRN: [id number] DOB:01/02/1966, 58 y.o., female Today's Date: 02/04/2024  END OF SESSION:  PT End of Session - 02/04/24 0838     Visit Number 6    Date for PT Re-Evaluation 07/17/24    Authorization Type [employer]    PT Start Time [phone]    PT Stop Time 0924    PT Time Calculation (min) 46 min    Activity Tolerance Patient tolerated treatment well    Behavior During Therapy [facility] for tasks assessed/performed                Past Medical History:  Diagnosis Date   ADHD (attention deficit hyperactivity disorder) 2024   Dysuria    Erythematous bladder mucosa    Heart murmur    asymptomatic per pt --  no echo   Hematuria    History of cervical cancer    12/ 2014  Stage IB2--  s/p  TAH w/ BSO and pelvic lymphadectomy/  and radiation therapy   History of radiation therapy 10/20/13-11/28/13   pelvis 50.4 gray   Hypertension    Lesion of bladder    Mild intermittent asthma    Neuromuscular disorder (HCC)    Seasonal allergies    Thyroid disease    Past Surgical History:  Procedure Laterality Date   CYSTOSCOPY WITH BIOPSY N/A 04/29/2015   Procedure: CYSTOSCOPY WITH BIOPSY WITH FULGERATION;  Surgeon: Marco Severs, MD;  Location: [facility];  Service: Urology;  Laterality: N/A;  1 HR [phone] [id number]   FOOT SURGERY Left 2010   RADICAL ABDOMINAL HYSTERECTOMY  09-05-2013   w/ BILATERAL SALPINGOOPHORECTOMY AND PELVIC LYMPHADECTOMY   Patient Active Problem List   Diagnosis Date Noted   SUI (stress urinary incontinence, female) 11/01/2023   History of urinary retention 11/01/2023   Neurogenic bladder 10/18/2023   Hematuria 10/18/2023   Acute urinary retention 10/18/2023   Preventive measure 12/20/2022   Stress and adjustment reaction 01/13/2021   Inattention 10/28/2019   Hypothyroid 11/13/2016   Neuropathy involving both lower extremities 11/13/2016    Cystitis, radiation 05/22/2014   Cervical cancer (HCC) 09/01/2013   Adenocarcinoma of cervix, 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   Asthma, mild intermittent 07/07/2009   INSOMNIA 05/28/2008   Generalized anxiety disorder 05/07/2008   HYPERTENSION, BENIGN 05/31/2007    PCP: Mickel Alanis, MD  REFERRING PROVIDER: Merriam Abbey, DO   REFERRING DIAG:  [phone] (ICD-10-CM) - Bilateral leg weakness G62.89 (ICD-10-CM) - Other 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 The weather makes a difference.   Eval: Right leg worse than left but both painful. Right has instances of knee just giving out, has neuropathy - like weakness, pain since radiation in 2015. The nerves have been affected and greatly limiting her activity. Does have symptoms of numbness, sensory deficits, weakness, ataxic gait, and painful but all vary based on the day.     Fluid intake: Yes: water    PAIN:  Are you having pain? Yes NPRS scale:6/10 Pain location: bil legs Right leg  Pain type: aching Pain description: constant   Aggravating factors: walking, end of day, rainy days Relieving factors: try not to walk, decreasing activities    PRECAUTIONS: Other: cervical cancer with radiation  RED FLAGS: None   WEIGHT BEARING RESTRICTIONS: No  FALLS:  Has patient fallen in last 6 months? Yes. Number of falls 1x/weekly due to weakness and decreased balance  from having the radiation, trouble with low light - reports she does need to furniture walk to help with balance  LIVING ENVIRONMENT: Lives with: lives with their family  OCCUPATION: sits with laptop on her lap for 12 hours per day; unable to sit with legs down due to them falling asleep  PLOF: Independent  PATIENT GOALS: to strengthening enough to walk across grass, wants to have better balance, stronger legs                                        PERTINENT HISTORY:  ADHD; Cervical Cancer 2014; 9 weeks of radiation; Radical hysterectomy;    OBJECTIVE:  Note: Objective measures were completed at Evaluation unless otherwise noted.  DIAGNOSTIC FINDINGS:   PATIENT SURVEYS:  Lower Extremity Functional Score: 5 / 80 = 6.3 %  COGNITION: Overall cognitive status: Within functional limits for tasks assessed     SENSATION: Poor sensation at Rt LE - light touch felt along posterior gastroc only no awareness without visualization of being touched at medial, lateral, anterior lower leg, reports numbness in bil feet Rt>Lt  EDEMA:  Rt ankle sometimes swells per pt  MUSCLE LENGTH: Bil hamstrings and adductors limited by 25%;  POSTURE: rounded shoulders and forward head  LOWER EXTREMITY ROM:  weakness and poor coordination/proprioception   LOWER EXTREMITY MMT:  MMT Right Eval */5 Left Eval */5 Right 5/12 Left 5/12  Hip flexion 2 3 3 4   Hip extension 2 3    Hip abduction 3 3+    Hip adduction 1+ 2+    Hip internal rotation      Hip external rotation      Knee flexion 2 3    Knee extension 2+ 3    Ankle dorsiflexion 0 4    Ankle plantarflexion 3 4    Ankle inversion 1 4    Ankle eversion 2 4     (Blank rows = not tested)   FUNCTIONAL TESTS:  Five times Sit to Stand Test (FTSS) Method: Use a straight back chair with a solid seat that is 16-18" high. Ask participant to sit on the chair with arms folded across their chest.   Instructions: "Stand up and sit down as  quickly as possible 5 times, keeping your arms folded across your chest."   Measurement: Stop timing when the participant stands the 5th time.  TIME: ______ (in seconds)  Times > 13.6 seconds is associated  with increased disability and morbidity (Guralnik, 2000) Times > 15 seconds is predictive of recurrent falls in healthy individuals aged 39 and older (Buatois, et al., 2008) Normal performance values in community dwelling individuals aged 22 and older (Bohannon, 2006): 50-59 years: 8 seconds 60-69 years: 11.4 seconds 70-79 years: 12.6 seconds 80-89 years: 14.8 seconds  MCID: >= 2.3 seconds for Vestibular Disorders (Meretta, 2006)  5 times sit to stand: 22s with hands - very unstable with poor hip, posture, core control  Timed up and go (TUG): 28s with SBQC - min A  GAIT: Distance walked: 200' Assistive device utilized: Quad cane small base Level of assistance: CGA Comments: Rt LE demonstrating ataxic pattern, Lt trunk lean intermittently  to right self, poor step height bil, decreased stride length, limited glute activation                                                                                                                                 TREATMENT DATE:  02/04/2024 Nu step seat 4,UE 9, Lev 5 x 5 min, therapist assisting hip to maintain neutral Heel raises 2 x 10 feet straight and toe out HS curls standing x15 Sit to stand to barre x 6 - blue loop around thighs to activate glutes, Then added foam to chair and ball between thighs x 5 TC to encourage WB through right LE LAQ 4x5 B Seated unilateral clam - blue loop 2x5 B then together x 10 Seated march 2x5 B Standing march x 10 B Leg press 45# B seat 4, 35# unilateral x 10 Worked on floor reach to [employer] with blue loop around thighs x 10    02/01/2024 VC's and TC's throughout to prevent compensations Nu step seat 3,UE 9, Lev 5 x 6 min, therapist assisting hip to maintain neutral Heel raises 2 x 10 feet  straight and toe out HS curls standing x15 TKE red B x 10 Sit to stand to barre x 15 - blue loop around thighs to activate glutes, TC to encourage WB through right LE LAQ 2x10 B Seated unilateral clam - blue loop x 10 B then together x 10 Seated adductor squeeze with kegel contraction 5 sec hold 2x10 Heel raises 2x10 straight and toes out Standing HS curl x 10 B TKE with red TB standing x 10 ea  01/29/24 Nustep seat 4 L1 x 6 min - used blue Nustep with foot straps to hold feet in Floor slider hip abduction 8x right/left (initially lacks motor control but improves with repetition) Standing holding to barre: pelvis press forward to activate hip and trunk extensors against purple heavy power band anchored by therapist 15x Holding to barre with single leg modified RDL to touch stool 10x right/left (compensatory knee hyperextension)  Seated unilateral clam - blue loop x 10 B then together x 10 Seated leg press isometric into red physioball 10x 5 sec hold right/left Sit to stand to barre x 10 -  blue loop around thighs to activate glutes 10x RPE 4/10 Functional squat to chair + foam pad with blue loop around thighs at barre 10x   01/25/24 Nustep L1 x 6 min - used Nustep with foot straps to hold feet in Sit to stand to barre x 10 - yellow loop around thighs to activate glutes Functional squat to chair + foam pad with yellow band at barre  LAQ 2x10 B Seated unilateral clam - blue loop x 10 B then together x 10 Seated adductor squeeze with kegel contraction 5 sec hold 2x10 Heel raises 2x10 straight and toes out Standing HS curl x 10 B TKE into ball R 5 sec hold x 10    PATIENT EDUCATION:  Education details: [id number] - uses App Person educated: Patient Education method: Explanation, Demonstration, Tactile cues, Verbal cues, and Handouts Education comprehension: verbalized understanding, returned demonstration, verbal cues required, tactile cues required, and needs further education  HOME  EXERCISE PROGRAM: Access Code: [email or web address] URL: [email or web address] Date: 02/04/2024 Prepared by: Concha Deed  Exercises - Supine Heel Slide with Strap  - 1 x daily - 7 x weekly - 3 sets - 10 reps - Pillow squeezes with kegel  - 1 x daily - 7 x weekly - 3 sets - 10 reps - Sit to Stand with Counter Support  - 1 x daily - 7 x weekly - 1 sets - 10 reps - Seated Long Arc Quad  - 1 x daily - 7 x weekly - 1-3 sets - 10 reps - 5 sec hold - Seated Isometric Hip Abduction with Resistance  - 1 x daily - 7 x weekly - 1-3 sets - 10 reps - Heel Raises with Counter Support  - 1 x daily - 7 x weekly - 1-3 sets - 10 reps - Standing Terminal Knee Extension at Wall with Ball  - 2 x daily - 7 x weekly - 2 sets - 10 reps - 5 sec hold - Squat with Chair Touch and Resistance Loop  - 1 x daily - 3 x weekly - 2 sets - 10 reps - Standing Knee Flexion with Counter Support  - 1 x daily - 3 x weekly - 2 sets - 10 reps - Supine Bridge with Resistance Band  - 1 x daily - 7 x weekly - 1-2 sets - 10 reps - 5 sec hold  ASSESSMENT:  CLINICAL IMPRESSION:   Patient is progressing with her goals. She has met her standing STG. She  was able to stand for 5-7 minutes comfortably last night with correct posture before needing to sit down. Functionally she is still unable to return to standing after reaching to the floor without UE support. Added bridges today to work on hip extension strength. Sit to stands are still challenging - more so on descent, but we did need to add foam today. Overall she had greater pain today secondary to weather she believes. She reported decreased pain at end of session.    Eval: Patient is a 58 y.o. female  who was seen today for physical therapy evaluation and treatment for poor gait mechanics, impaired posture, impaired balance, decreased hip and core strength, increased fall risk. Pt has complex history of  Radiation-induced lumbosacral plexopathy and Polyradiculoneuropathy status post  cervical cancer. Pt has completed pelvic floor PT recently and had positive outcomes with this and resolution of symptoms. Pt very motivated to participate in PT for bil LE strengthening and decreased fall risk. Pt demonstrated impairments with objective measurements of  TUG and 5xSTS indicative of increased fall risk and balance deficits. Pt reports she is unable to hold and carry grandchildren, to walk on uneven surfaces, needs assistance with dressing, walking, transfers sometimes and wants to be more I. Pt would benefit from additional PT to further address deficits.    OBJECTIVE IMPAIRMENTS: Abnormal gait, decreased activity tolerance, decreased coordination, decreased endurance, decreased mobility, difficulty walking, decreased strength, increased fascial restrictions, increased muscle spasms, impaired flexibility, impaired sensation, improper body mechanics, postural dysfunction, and pain.   ACTIVITY LIMITATIONS: carrying, lifting, bending, sitting, standing, squatting, stairs, transfers, bed mobility, locomotion level, and caring for others  PARTICIPATION LIMITATIONS: meal prep, cleaning, laundry, interpersonal relationship, driving, shopping, community activity, occupation, and yard work  PERSONAL FACTORS: Fitness, Past/current experiences, Time since onset of injury/illness/exacerbation, and 1 comorbidity: medical history are also affecting patient's functional outcome.   REHAB POTENTIAL: Good  CLINICAL DECISION MAKING: Evolving/moderate complexity  EVALUATION COMPLEXITY: Moderate   GOALS: Goals reviewed with patient? Yes  SHORT TERM GOALS: Target date: 02/13/24 Pt to be I with HEP for carry over and continuing recommendations for improved outcomes.   Baseline: Goal status: INITIAL  2.  Pt to demonstrate at least 3/5 Rt LE strength grossly and 4/5 Lt for improved pelvic stability and functional squats without falling.  Baseline:  Goal status: INITIAL  3.  Pt to demonstrate  ability to complete static standing without AD for at least 5 mins at midline to complete gentle kitchen tasks/household tasks without fear of falling.  Baseline:  Goal status: MET  4.  Pt to demonstrate I with dynamic sitting balance to better be able to reach floor level for retrieving fallen objects and putting on socks without falling Baseline:  Goal status: IN PROGRESS 02/04/24  LONG TERM GOALS: Target date: 07/17/24  Pt to be I with advanced HEP for carry over and continuing recommendations for improved outcomes.   Baseline:  Goal status: INITIAL  2.  Pt to report improved LEFS score to at least 30/80 for improved I with functional tasks at home such as dressing, walking from room to room.  Baseline:  Goal status: INITIAL  3.  Pt to demonstrate improved dynamic standing balance with or without AD at no more than CGA to better be able to play with grandchildren without falling.  Baseline:  Goal status: INITIAL  4.  Pt to demonstrate improved gait mechanics with LRAD for 150' with minimal compensate strategies to I with pt safety with ambulating into a store.  Baseline:  Goal status: INITIAL  5.  Pt to demonstrate ability to perform x5 Sit to stand with at least 10# to more safely assist in caring for grandchildren without risk of falling.  Baseline:  Goal status: INITIAL    PLAN:  PT FREQUENCY: 2x/week  PT DURATION: 12 weeks  PLANNED INTERVENTIONS: MMT, add ankle strengthening, gluteal strengthening, quad control bilaterally   97110-Therapeutic exercises, 97530- Therapeutic activity, 97112- Neuromuscular re-education, 97535- Self Care, [id number]- Manual therapy, [phone]- Gait training, [phone]- Canalith repositioning, [id number]- Aquatic Therapy, [phone]- Splinting, [id number]- Electrical stimulation (manual), [id number]- Vasopneumatic device, [id number]- Ultrasound, [id number]- Traction (mechanical), [id number]- Ionotophoresis 4mg /ml Dexamethasone , Patient/Family education, Balance training, Stair training,  Taping, Dry Needling, Joint mobilization, Joint manipulation, Spinal manipulation, Spinal mobilization, Scar mobilization, DME instructions, Wheelchair mobility training, Cryotherapy, Moist heat, and Biofeedback  Jinx Mourning, PT 02/04/24 9:32 AM Phone: [phone] Fax: [phone]

## 2024-02-04 ENCOUNTER — Ambulatory Visit: Admitting: Physical Therapy

## 2024-02-04 ENCOUNTER — Encounter: Payer: Self-pay | Admitting: Physical Therapy

## 2024-02-04 DIAGNOSIS — M6281 Muscle weakness (generalized): Secondary | ICD-10-CM | POA: Diagnosis not present

## 2024-02-04 DIAGNOSIS — R279 Unspecified lack of coordination: Secondary | ICD-10-CM

## 2024-02-04 DIAGNOSIS — R293 Abnormal posture: Secondary | ICD-10-CM

## 2024-02-04 DIAGNOSIS — M62838 Other muscle spasm: Secondary | ICD-10-CM

## 2024-02-04 DIAGNOSIS — R269 Unspecified abnormalities of gait and mobility: Secondary | ICD-10-CM

## 2024-02-07 NOTE — Therapy (Signed)
 OUTPATIENT PHYSICAL THERAPY FEMALE PELVIC TREATMENT   Patient Name: Anna Ramsey MRN: [id number] DOB:1966/03/19, 58 y.o., female Today's Date: 02/08/2024  END OF SESSION:  PT End of Session - 02/08/24 0844     Visit Number 7    Date for PT Re-Evaluation 07/17/24    Authorization Type [employer]    PT Start Time [phone]    PT Stop Time 0931    PT Time Calculation (min) 50 min    Activity Tolerance Patient tolerated treatment well    Behavior During Therapy [facility] for tasks assessed/performed                 Past Medical History:  Diagnosis Date   ADHD (attention deficit hyperactivity disorder) 2024   Dysuria    Erythematous bladder mucosa    Heart murmur    asymptomatic per pt --  no echo   Hematuria    History of cervical cancer    12/ 2014  Stage IB2--  s/p  TAH w/ BSO and pelvic lymphadectomy/  and radiation therapy   History of radiation therapy 10/20/13-11/28/13   pelvis 50.4 gray   Hypertension    Lesion of bladder    Mild intermittent asthma    Neuromuscular disorder (HCC)    Seasonal allergies    Thyroid disease    Past Surgical History:  Procedure Laterality Date   CYSTOSCOPY WITH BIOPSY N/A 04/29/2015   Procedure: CYSTOSCOPY WITH BIOPSY WITH FULGERATION;  Surgeon: Marco Severs, MD;  Location: [facility];  Service: Urology;  Laterality: N/A;  1 HR [phone] [id number]   FOOT SURGERY Left 2010   RADICAL ABDOMINAL HYSTERECTOMY  09-05-2013   w/ BILATERAL SALPINGOOPHORECTOMY AND PELVIC LYMPHADECTOMY   Patient Active Problem List   Diagnosis Date Noted   SUI (stress urinary incontinence, female) 11/01/2023   History of urinary retention 11/01/2023   Neurogenic bladder 10/18/2023   Hematuria 10/18/2023   Acute urinary retention 10/18/2023   Preventive measure 12/20/2022   Stress and adjustment reaction 01/13/2021   Inattention 10/28/2019   Hypothyroid 11/13/2016   Neuropathy involving both lower extremities 11/13/2016    Cystitis, radiation 05/22/2014   Cervical cancer (HCC) 09/01/2013   Adenocarcinoma of cervix, 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   Asthma, mild intermittent 07/07/2009   INSOMNIA 05/28/2008   Generalized anxiety disorder 05/07/2008   HYPERTENSION, BENIGN 05/31/2007    PCP: Mickel Alanis, MD  REFERRING PROVIDER: Merriam Abbey, DO   REFERRING DIAG:  [phone] (ICD-10-CM) - Bilateral leg weakness G62.89 (ICD-10-CM) - 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 I was able to lower myself evenly to the toilet.   Eval: Right leg worse than left but both painful. Right has instances of knee just giving out, has neuropathy - like weakness, pain since radiation in 2015. The nerves have been affected and greatly limiting her activity. Does have symptoms of numbness, sensory deficits, weakness, ataxic gait, and painful but all vary based on the day.     Fluid intake: Yes: water    PAIN:  Are you having pain? Yes NPRS scale:6/10 Pain location: bil legs Right leg  Pain type: aching Pain description: constant   Aggravating factors: walking, end of day, rainy days Relieving factors: try not to walk, decreasing activities    PRECAUTIONS: Other: cervical cancer with radiation  RED FLAGS: None   WEIGHT BEARING RESTRICTIONS: No  FALLS:  Has patient fallen in last 6 months? Yes. Number of falls 1x/weekly  due to weakness and decreased balance from having the radiation, trouble with low light - reports she does need to furniture walk to help with balance  LIVING ENVIRONMENT: Lives with: lives with their family  OCCUPATION: sits with laptop on her lap for 12 hours per day; unable to sit with legs down due to them falling asleep  PLOF: Independent  PATIENT GOALS: to strengthening enough to walk across grass, wants to have better balance, stronger legs                                        PERTINENT HISTORY:  ADHD; Cervical Cancer 2014; 9 weeks of radiation; Radical hysterectomy;    OBJECTIVE:  Note: Objective measures were completed at Evaluation unless otherwise noted.  DIAGNOSTIC FINDINGS:   PATIENT SURVEYS:  Lower Extremity Functional Score: 5 / 80 = 6.3 %  COGNITION: Overall cognitive status: Within functional limits for tasks assessed     SENSATION: Poor sensation at Rt LE - light touch felt along posterior gastroc only no awareness without visualization of being touched at medial, lateral, anterior lower leg, reports numbness in bil feet Rt>Lt  EDEMA:  Rt ankle sometimes swells per pt  MUSCLE LENGTH: Bil hamstrings and adductors limited by 25%;  POSTURE: rounded shoulders and forward head  LOWER EXTREMITY ROM:  weakness and poor coordination/proprioception   LOWER EXTREMITY MMT:  02/04/24 hip flexion was R 3/5, L 4/5  MMT Right Eval */5 Left Eval */5 Right 5/16 Left 5/16  Hip flexion 2 3 4+ 5  Hip extension 2 3 4+ 4  Hip abduction 3 3+ 5- 5  Hip adduction 1+ 2+ 2+ 2+  Hip internal rotation      Hip external rotation      Knee flexion 2 3 3- 4  Knee extension 2+ 3 3- 5  Ankle dorsiflexion 0 4 0 4+ long sitting  Ankle plantarflexion 3 4    Ankle inversion 1 4 3+ tested in long sitting 4+ tested in long sitting  Ankle eversion 2 4 3+ tested in long sitting 4- tested in long sitting   (Blank rows = not tested)   FUNCTIONAL TESTS:  Five times Sit to  Stand Test (FTSS) Method: Use a straight back chair with a solid seat that is 16-18" high. Ask participant to sit on the chair with arms folded across their chest.   Instructions: "Stand up and sit down as quickly as possible 5 times, keeping your  arms folded across your chest."   Measurement: Stop timing when the participant stands the 5th time.  TIME: ______ (in seconds)  Times > 13.6 seconds is associated with increased disability and morbidity (Guralnik, 2000) Times > 15 seconds is predictive of recurrent falls in healthy individuals aged 33 and older (Buatois, et al., 2008) Normal performance values in community dwelling individuals aged 61 and older (Bohannon, 2006): 50-59 years: 8 seconds 60-69 years: 11.4 seconds 70-79 years: 12.6 seconds 80-89 years: 14.8 seconds  MCID: >= 2.3 seconds for Vestibular Disorders (Meretta, 2006)  5 times sit to stand: 22s with hands - very unstable with poor hip, posture, core control  Timed up and go (TUG): 28s with SBQC - min A  GAIT: Distance walked: 200' Assistive device utilized: Quad cane small base Level of assistance: CGA Comments: Rt LE demonstrating ataxic pattern, Lt trunk lean intermittently  to right self, poor step height bil, decreased stride length, limited glute activation                                                                                                                                 TREATMENT DATE:  02/08/2024 Nu step seat 4,UE 9, Lev 5 x 6 min, therapist assisting hip to maintain neutral MMT completed Ankle strengthening: hooklying over bolster B Inv  squeezing ball between feet 5 sec hold --> active Inv/ever Long sitting ankle pumps Adductor squeeze in supine Standing hip ADDuction with foot on slider - also did diagonals, forward and backward Gastroc stretch B on incline board in fixed position on wall Attempted various soleus stretches in standing, sitting and supine - none were  effective   02/04/2024 Nu step seat 4,UE 9, Lev 5 x 5 min, therapist assisting hip to maintain neutral Heel raises 2 x 10 feet straight and toe out HS curls standing x15 Sit to stand to barre x 6 - blue loop around thighs to activate glutes, Then added foam to chair and ball between thighs x 5 TC to encourage WB through right LE LAQ 4x5 B Seated unilateral clam - blue loop 2x5 B then together x 10 Seated march 2x5 B Standing march x 10 B Leg press 45# B seat 4, 35# unilateral x 10 Worked on floor reach to [employer] with blue loop around thighs x 10    02/01/2024 VC's and TC's throughout to prevent compensations Nu step seat 3,UE 9, Lev 5 x 6 min, therapist assisting hip to maintain neutral Heel raises 2 x 10 feet straight and toe out HS curls standing x15 TKE red B x 10 Sit to stand to barre x 15 - blue loop around thighs to activate glutes, TC to encourage WB through right LE LAQ 2x10 B Seated unilateral clam - blue loop x 10 B then together x 10 Seated adductor squeeze with kegel contraction 5 sec hold 2x10 Heel raises 2x10 straight and toes out Standing HS curl x 10  B TKE with red TB standing x 10 ea  01/29/24 Nustep seat 4 L1 x 6 min - used blue Nustep with foot straps to hold feet in Floor slider hip abduction 8x right/left (initially lacks motor control but improves with repetition) Standing holding to barre: pelvis press forward to activate hip and trunk extensors against purple heavy power band anchored by therapist 15x Holding to barre with single leg modified RDL to touch stool 10x right/left (compensatory knee hyperextension)  Seated unilateral clam - blue loop x 10 B then together x 10 Seated leg press isometric into red physioball 10x 5 sec hold right/left Sit to stand to barre x 10 - blue loop around thighs to activate glutes 10x RPE 4/10 Functional squat to chair + foam pad with blue loop around thighs at barre 10x   01/25/24 Nustep L1 x 6 min - used Nustep with  foot straps to hold feet in Sit to stand to barre x 10 - yellow loop around thighs to activate glutes Functional squat to chair + foam pad with yellow band at barre  LAQ 2x10 B Seated unilateral clam - blue loop x 10 B then together x 10 Seated adductor squeeze with kegel contraction 5 sec hold 2x10 Heel raises 2x10 straight and toes out Standing HS curl x 10 B TKE into ball R 5 sec hold x 10    PATIENT EDUCATION:  Education details: [id number] - uses App Person educated: Patient Education method: Explanation, Demonstration, Tactile cues, Verbal cues, and Handouts Education comprehension: verbalized understanding, returned demonstration, verbal cues required, tactile cues required, and needs further education  HOME EXERCISE PROGRAM: Access Code: [email or web address] URL: [email or web address] Date: 02/04/2024 Prepared by: Concha Deed  Exercises - Supine Heel Slide with Strap  - 1 x daily - 7 x weekly - 3 sets - 10 reps - Pillow squeezes with kegel  - 1 x daily - 7 x weekly - 3 sets - 10 reps - Sit to Stand with Counter Support  - 1 x daily - 7 x weekly - 1 sets - 10 reps - Seated Long Arc Quad  - 1 x daily - 7 x weekly - 1-3 sets - 10 reps - 5 sec hold - Seated Isometric Hip Abduction with Resistance  - 1 x daily - 7 x weekly - 1-3 sets - 10 reps - Heel Raises with Counter Support  - 1 x daily - 7 x weekly - 1-3 sets - 10 reps - Standing Terminal Knee Extension at Wall with Ball  - 2 x daily - 7 x weekly - 2 sets - 10 reps - 5 sec hold - Squat with Chair Touch and Resistance Loop  - 1 x daily - 3 x weekly - 2 sets - 10 reps - Standing Knee Flexion with Counter Support  - 1 x daily - 3 x weekly - 2 sets - 10 reps - Supine Bridge with Resistance Band  - 1 x daily - 7 x weekly - 1-2 sets - 10 reps - 5 sec hold  ASSESSMENT:  CLINICAL IMPRESSION:  Patient is making significant gains with LE strengthening since evaluation and reports functional improvements at home with lowering herself  to the floor and commode. She still has marked deficits in LE strength especially in R ankle DF, knee flex/ext and ankle inv/ever and B hip ADDuction. She continues to demonstrate potential for improvement and would benefit from continued skilled therapy to address impairments.     Eval: Patient is a 58 y.o.  female  who was seen today for physical therapy evaluation and treatment for poor gait mechanics, impaired posture, impaired balance, decreased hip and core strength, increased fall risk. Pt has complex history of  Radiation-induced lumbosacral plexopathy and Polyradiculoneuropathy status post cervical cancer. Pt has completed pelvic floor PT recently and had positive outcomes with this and resolution of symptoms. Pt very motivated to participate in PT for bil LE strengthening and decreased fall risk. Pt demonstrated impairments with objective measurements of TUG and 5xSTS indicative of increased fall risk and balance deficits. Pt reports she is unable to hold and carry grandchildren, to walk on uneven surfaces, needs assistance with dressing, walking, transfers sometimes and wants to be more I. Pt would benefit from additional PT to further address deficits.    OBJECTIVE IMPAIRMENTS: Abnormal gait, decreased activity tolerance, decreased coordination, decreased endurance, decreased mobility, difficulty walking, decreased strength, increased fascial restrictions, increased muscle spasms, impaired flexibility, impaired sensation, improper body mechanics, postural dysfunction, and pain.   ACTIVITY LIMITATIONS: carrying, lifting, bending, sitting, standing, squatting, stairs, transfers, bed mobility, locomotion level, and caring for others  PARTICIPATION LIMITATIONS: meal prep, cleaning, laundry, interpersonal relationship, driving, shopping, community activity, occupation, and yard work  PERSONAL FACTORS: Fitness, Past/current experiences, Time since onset of injury/illness/exacerbation, and 1  comorbidity: medical history are also affecting patient's functional outcome.   REHAB POTENTIAL: Good  CLINICAL DECISION MAKING: Evolving/moderate complexity  EVALUATION COMPLEXITY: Moderate   GOALS: Goals reviewed with patient? Yes  SHORT TERM GOALS: Target date: 02/13/24 Pt to be I with HEP for carry over and continuing recommendations for improved outcomes.   Baseline: Goal status: INITIAL  2.  Pt to demonstrate at least 3/5 Rt LE strength grossly and 4/5 Lt for improved pelvic stability and functional squats without falling.  Baseline:  Goal status: INITIAL  3.  Pt to demonstrate ability to complete static standing without AD for at least 5 mins at midline to complete gentle kitchen tasks/household tasks without fear of falling.  Baseline:  Goal status: MET  4.  Pt to demonstrate I with dynamic sitting balance to better be able to reach floor level for retrieving fallen objects and putting on socks without falling Baseline:  Goal status: IN PROGRESS 02/04/24  LONG TERM GOALS: Target date: 07/17/24  Pt to be I with advanced HEP for carry over and continuing recommendations for improved outcomes.   Baseline:  Goal status: INITIAL  2.  Pt to report improved LEFS score to at least 30/80 for improved I with functional tasks at home such as dressing, walking from room to room.  Baseline:  Goal status: INITIAL  3.  Pt to demonstrate improved dynamic standing balance with or without AD at no more than CGA to better be able to play with grandchildren without falling.  Baseline:  Goal status: INITIAL  4.  Pt to demonstrate improved gait mechanics with LRAD for 150' with minimal compensate strategies to I with pt safety with ambulating into a store.  Baseline:  Goal status: INITIAL  5.  Pt to demonstrate ability to perform x5 Sit to stand with at least 10# to more safely assist in caring for grandchildren without risk of falling.  Baseline:  Goal status:  INITIAL    PLAN:  PT FREQUENCY: 2x/week  PT DURATION: 12 weeks  PLANNED INTERVENTIONS: MMT, add ankle strengthening, gluteal strengthening, quad control bilaterally   97110-Therapeutic exercises, 97530- Therapeutic activity, [id number]- Neuromuscular re-education, 97535- Self Care, 40981- Manual therapy, [id number]- Gait training, [id number]- Canalith repositioning, [id number]-  Aquatic Therapy, [id number]- Splinting, 16109- Electrical stimulation (manual), [id number]- Vasopneumatic device, [id number]- Ultrasound, [id number]- Traction (mechanical), [id number]- Ionotophoresis 4mg /ml Dexamethasone , Patient/Family education, Balance training, Stair training, Taping, Dry Needling, Joint mobilization, Joint manipulation, Spinal manipulation, Spinal mobilization, Scar mobilization, DME instructions, Wheelchair mobility training, Cryotherapy, Moist heat, and Biofeedback  Jinx Mourning, PT 02/08/24 10:33 AM Phone: [phone] Fax: [phone]

## 2024-02-08 ENCOUNTER — Ambulatory Visit: Admitting: Physical Therapy

## 2024-02-08 ENCOUNTER — Encounter: Payer: Self-pay | Admitting: Physical Therapy

## 2024-02-08 DIAGNOSIS — M6281 Muscle weakness (generalized): Secondary | ICD-10-CM | POA: Diagnosis not present

## 2024-02-08 DIAGNOSIS — R102 Pelvic and perineal pain: Secondary | ICD-10-CM

## 2024-02-08 DIAGNOSIS — M62838 Other muscle spasm: Secondary | ICD-10-CM

## 2024-02-08 DIAGNOSIS — R293 Abnormal posture: Secondary | ICD-10-CM

## 2024-02-08 DIAGNOSIS — R279 Unspecified lack of coordination: Secondary | ICD-10-CM

## 2024-02-08 DIAGNOSIS — R269 Unspecified abnormalities of gait and mobility: Secondary | ICD-10-CM

## 2024-02-11 ENCOUNTER — Ambulatory Visit

## 2024-02-11 DIAGNOSIS — M6281 Muscle weakness (generalized): Secondary | ICD-10-CM | POA: Diagnosis not present

## 2024-02-11 DIAGNOSIS — R269 Unspecified abnormalities of gait and mobility: Secondary | ICD-10-CM

## 2024-02-11 DIAGNOSIS — R293 Abnormal posture: Secondary | ICD-10-CM

## 2024-02-11 NOTE — Therapy (Signed)
 OUTPATIENT PHYSICAL THERAPY FEMALE PELVIC TREATMENT   Patient Name: HAYLEEN CLINKSCALES MRN: [id number] DOB:[birth year], 58 y.o., female Today's Date: 02/11/2024  END OF SESSION:  PT End of Session - 02/11/24 0843     Visit Number 8    Date for PT Re-Evaluation 07/17/24    Authorization Type [employer]    PT Start Time 0758    PT Stop Time 0843    PT Time Calculation (min) 45 min    Activity Tolerance Patient tolerated treatment well    Behavior During Therapy [facility] for tasks assessed/performed                  Past Medical History:  Diagnosis Date   ADHD (attention deficit hyperactivity disorder) 2024   Dysuria    Erythematous bladder mucosa    Heart murmur    asymptomatic per pt --  no echo   Hematuria    History of cervical cancer    12/ 2014  Stage IB2--  s/p  TAH w/ BSO and pelvic lymphadectomy/  and radiation therapy   History of radiation therapy 10/20/13-11/28/13   pelvis 50.4 gray   Hypertension    Lesion of bladder    Mild intermittent asthma    Neuromuscular disorder (HCC)    Seasonal allergies    Thyroid disease    Past Surgical History:  Procedure Laterality Date   CYSTOSCOPY WITH BIOPSY N/A 04/29/2015   Procedure: CYSTOSCOPY WITH BIOPSY WITH FULGERATION;  Surgeon: Marco Severs, MD;  Location: [facility];  Service: Urology;  Laterality: N/A;  1 HR [phone] [id number]   FOOT SURGERY Left 2010   RADICAL ABDOMINAL HYSTERECTOMY  09-05-2013   w/ BILATERAL SALPINGOOPHORECTOMY AND PELVIC LYMPHADECTOMY   Patient Active Problem List   Diagnosis Date Noted   SUI (stress urinary incontinence, female) 11/01/2023   History of urinary retention 11/01/2023   Neurogenic bladder 10/18/2023   Hematuria 10/18/2023   Acute urinary retention 10/18/2023   Preventive measure 12/20/2022   Stress and adjustment reaction 01/13/2021   Inattention 10/28/2019   Hypothyroid 11/13/2016   Neuropathy involving both lower extremities 11/13/2016    Cystitis, radiation 05/22/2014   Cervical cancer (HCC) 09/01/2013   Adenocarcinoma of cervix, 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   Asthma, mild intermittent 07/07/2009   INSOMNIA 05/28/2008   Generalized anxiety disorder 05/07/2008   HYPERTENSION, BENIGN 05/31/2007    PCP: Mickel Alanis, MD  REFERRING PROVIDER: Merriam Abbey, DO   REFERRING DIAG:  [phone] (ICD-10-CM) - Bilateral leg weakness G62.89 (ICD-10-CM) - 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  I am setting realistic goals.     Eval: Right leg worse than left but both painful. Right has instances of knee just giving out, has neuropathy - like weakness, pain since radiation in 2015. The nerves have been affected and greatly limiting her activity. Does have symptoms of numbness, sensory deficits, weakness, ataxic gait, and painful but all vary based on the day.     Fluid intake: Yes: water    PAIN:  Are you having pain? Yes NPRS scale:6/10 Pain location: bil legs Right leg  Pain type: aching Pain description: constant   Aggravating factors: walking, end of day, rainy days Relieving factors: try not to walk, decreasing activities    PRECAUTIONS: Other: cervical cancer with radiation  RED FLAGS: None   WEIGHT BEARING RESTRICTIONS: No  FALLS:  Has patient fallen in last 6 months? Yes. Number of falls 1x/weekly due to weakness and decreased balance from having the radiation, trouble with low light -  reports she does need to furniture walk to help with balance  LIVING ENVIRONMENT: Lives with: lives with their family  OCCUPATION: sits with laptop on her lap for 12 hours per day; unable to sit with legs down due to them falling asleep  PLOF: Independent  PATIENT GOALS: to strengthening enough to walk across grass, wants to have better balance, stronger legs                                        PERTINENT HISTORY:  ADHD; Cervical Cancer 2014; 9 weeks of radiation; Radical hysterectomy;    OBJECTIVE:  Note: Objective measures were completed at Evaluation unless otherwise noted.  DIAGNOSTIC FINDINGS:   PATIENT SURVEYS:  Lower Extremity Functional Score: 5 / 80 = 6.3 %  COGNITION: Overall cognitive status: Within functional limits for tasks assessed     SENSATION: Poor sensation at Rt LE - light touch felt along posterior gastroc only no awareness without visualization of being touched at medial, lateral, anterior lower leg, reports numbness in bil feet Rt>Lt  EDEMA:  Rt ankle sometimes swells per pt  MUSCLE LENGTH: Bil hamstrings and adductors limited by 25%;  POSTURE: rounded shoulders and forward head  LOWER EXTREMITY ROM:  weakness and poor coordination/proprioception   LOWER EXTREMITY MMT:  02/04/24 hip flexion was R 3/5, L 4/5  MMT Right Eval */5 Left Eval */5 Right 5/16 Left 5/16  Hip flexion 2 3 4+ 5  Hip extension 2 3 4+ 4  Hip abduction 3 3+ 5- 5  Hip adduction 1+ 2+ 2+ 2+  Hip internal rotation      Hip external rotation      Knee flexion 2 3 3- 4  Knee extension 2+ 3 3- 5  Ankle dorsiflexion 0 4 0 4+ long sitting  Ankle plantarflexion 3 4    Ankle inversion 1 4 3+ tested in long sitting 4+ tested in long sitting  Ankle eversion 2 4 3+ tested in long sitting 4- tested in long sitting   (Blank rows = not tested)   FUNCTIONAL TESTS:  Five times Sit to Stand Test (FTSS) Method: Use a straight back chair with a solid seat that is 16-18" high.  Ask participant to sit on the chair with arms folded across their chest.   Instructions: "Stand up and sit down as quickly as possible 5 times, keeping your arms folded  across your chest."   Measurement: Stop timing when the participant stands the 5th time.  TIME: ______ (in seconds)  Times > 13.6 seconds is associated with increased disability and morbidity (Guralnik, 2000) Times > 15 seconds is predictive of recurrent falls in healthy individuals aged 15 and older (Buatois, et al., 2008) Normal performance values in community dwelling individuals aged 16 and older (Bohannon, 2006): 50-59 years: 8 seconds 60-69 years: 11.4 seconds 70-79 years: 12.6 seconds 80-89 years: 14.8 seconds  MCID: >= 2.3 seconds for Vestibular Disorders (Meretta, 2006)  5 times sit to stand: 22s with hands - very unstable with poor hip, posture, core control  Timed up and go (TUG): 28s with SBQC - min A  GAIT: Distance walked: 200' Assistive device utilized: Quad cane small base Level of assistance: CGA Comments: Rt LE demonstrating ataxic pattern, Lt trunk lean intermittently  to right self, poor step height bil, decreased stride length, limited glute activation                                                                                                                                 TREATMENT DATE:   02/11/2024 Nu step seat 4,UE 9, Lev 5 x 6 min, therapist assisting hip to maintain neutral Ankle strengthening: hooklying over bolster B Inv  squeezing ball between feet 5 sec hold --> active Inv/ever Bridge with blue loop around thighs x 10  Adductor squeeze in supine x10 Standing at barre: hip ADDuction with foot on slider - also did diagonals, forward and backward x5 each  Leg press: 45# bil x20 seat 4, 20# Lt and Rt x10 each with emphasis on alignment and control Heel raises 2 x 10 feet straight and toe out HS curls standing x15 Sit to stand to barre x 2x5 - blue loop around thighs to activate  glutes, pad added to seat    02/08/2024 Nu step seat 4,UE 9, Lev 5 x 6 min, therapist assisting hip to maintain neutral MMT completed Ankle strengthening: hooklying over bolster B Inv  squeezing ball between feet 5 sec hold --> active Inv/ever Long sitting ankle pumps Adductor squeeze in supine Standing hip ADDuction with foot on slider - also did diagonals, forward and backward Gastroc stretch B on incline board in fixed position on wall Attempted various soleus stretches in standing, sitting and supine - none were effective   02/04/2024 Nu step seat 4,UE 9, Lev 5 x 5 min, therapist assisting hip to maintain neutral Heel raises 2 x 10 feet straight and toe out HS curls standing x15 Sit to stand to barre x 6 - blue loop around thighs to activate glutes, Then added foam to chair and ball between thighs x 5 TC to encourage WB through right LE LAQ 4x5 B Seated unilateral clam - blue loop 2x5 B then together x 10 Seated march 2x5 B Standing march x 10 B Leg press 45# B seat 4, 35# unilateral  x 10 Worked on floor reach to [employer] with blue loop around thighs x 10     PATIENT EDUCATION:  Education details: [id number] - uses App Person educated: Patient Education method: Explanation, Demonstration, Tactile cues, Verbal cues, and Handouts Education comprehension: verbalized understanding, returned demonstration, verbal cues required, tactile cues required, and needs further education  HOME EXERCISE PROGRAM: Access Code: [email or web address] URL: [email or web address] Date: 02/04/2024 Prepared by: Concha Deed  Exercises - Supine Heel Slide with Strap  - 1 x daily - 7 x weekly - 3 sets - 10 reps - Pillow squeezes with kegel  - 1 x daily - 7 x weekly - 3 sets - 10 reps - Sit to Stand with Counter Support  - 1 x daily - 7 x weekly - 1 sets - 10 reps - Seated Long Arc Quad  - 1 x daily - 7 x weekly - 1-3 sets - 10 reps - 5 sec hold - Seated Isometric Hip Abduction with Resistance  - 1  x daily - 7 x weekly - 1-3 sets - 10 reps - Heel Raises with Counter Support  - 1 x daily - 7 x weekly - 1-3 sets - 10 reps - Standing Terminal Knee Extension at Wall with Ball  - 2 x daily - 7 x weekly - 2 sets - 10 reps - 5 sec hold - Squat with Chair Touch and Resistance Loop  - 1 x daily - 3 x weekly - 2 sets - 10 reps - Standing Knee Flexion with Counter Support  - 1 x daily - 3 x weekly - 2 sets - 10 reps - Supine Bridge with Resistance Band  - 1 x daily - 7 x weekly - 1-2 sets - 10 reps - 5 sec hold  ASSESSMENT:  CLINICAL IMPRESSION:  Pt is making steady gains and is able to do more reps of her exercises today.  She requires verbal cues for alignment and lateral glute activation.  Pt remains challenged with HEP and current clinic activity.  PT monitored throughout session for fatigue, safety and cueing for technique. She continues to demonstrate potential for improvement and would benefit from continued skilled therapy to address impairments.     Eval: Patient is a 58 y.o. female  who was seen today for physical therapy evaluation and treatment for poor gait mechanics, impaired posture, impaired balance, decreased hip and core strength, increased fall risk. Pt has complex history of  Radiation-induced lumbosacral plexopathy and Polyradiculoneuropathy status post cervical cancer. Pt has completed pelvic floor PT recently and had positive outcomes with this and resolution of symptoms. Pt very motivated to participate in PT for bil LE strengthening and decreased fall risk. Pt demonstrated impairments with objective measurements of TUG and 5xSTS indicative of increased fall risk and balance deficits. Pt reports she is unable to hold and carry grandchildren, to walk on uneven surfaces, needs assistance with dressing, walking, transfers sometimes and wants to be more I. Pt would benefit from additional PT to further address deficits.    OBJECTIVE IMPAIRMENTS: Abnormal gait, decreased activity  tolerance, decreased coordination, decreased endurance, decreased mobility, difficulty walking, decreased strength, increased fascial restrictions, increased muscle spasms, impaired flexibility, impaired sensation, improper body mechanics, postural dysfunction, and pain.   ACTIVITY LIMITATIONS: carrying, lifting, bending, sitting, standing, squatting, stairs, transfers, bed mobility, locomotion level, and caring for others  PARTICIPATION LIMITATIONS: meal prep, cleaning, laundry, interpersonal relationship, driving, shopping, community activity, occupation, and yard work  PERSONAL FACTORS: Fitness, Past/current experiences, Time since onset  of injury/illness/exacerbation, and 1 comorbidity: medical history are also affecting patient's functional outcome.   REHAB POTENTIAL: Good  CLINICAL DECISION MAKING: Evolving/moderate complexity  EVALUATION COMPLEXITY: Moderate   GOALS: Goals reviewed with patient? Yes  SHORT TERM GOALS: Target date: 02/13/24 Pt to be I with HEP for carry over and continuing recommendations for improved outcomes.   Baseline: Goal status: INITIAL  2.  Pt to demonstrate at least 3/5 Rt LE strength grossly and 4/5 Lt for improved pelvic stability and functional squats without falling.  Baseline:  Goal status: INITIAL  3.  Pt to demonstrate ability to complete static standing without AD for at least 5 mins at midline to complete gentle kitchen tasks/household tasks without fear of falling.  Baseline:  Goal status: MET  4.  Pt to demonstrate I with dynamic sitting balance to better be able to reach floor level for retrieving fallen objects and putting on socks without falling Baseline:  Goal status: IN PROGRESS 02/04/24  LONG TERM GOALS: Target date: 07/17/24  Pt to be I with advanced HEP for carry over and continuing recommendations for improved outcomes.   Baseline:  Goal status: INITIAL  2.  Pt to report improved LEFS score to at least 30/80 for improved I  with functional tasks at home such as dressing, walking from room to room.  Baseline:  Goal status: INITIAL  3.  Pt to demonstrate improved dynamic standing balance with or without AD at no more than CGA to better be able to play with grandchildren without falling.  Baseline:  Goal status: INITIAL  4.  Pt to demonstrate improved gait mechanics with LRAD for 150' with minimal compensate strategies to I with pt safety with ambulating into a store.  Baseline:  Goal status: INITIAL  5.  Pt to demonstrate ability to perform x5 Sit to stand with at least 10# to more safely assist in caring for grandchildren without risk of falling.  Baseline:  Goal status: INITIAL    PLAN:  PT FREQUENCY: 2x/week  PT DURATION: 12 weeks  PLANNED INTERVENTIONS: work on functional strength, control, lateral glute activation   97110-Therapeutic exercises, 97530- Therapeutic activity, [id number]- Neuromuscular re-education, [id number]- Self Care, [id number]- Manual therapy, [id number]- Gait training, [phone]- Canalith repositioning, [id number]- Aquatic Therapy, [phone]- Splinting, [id number]- Electrical stimulation (manual), [id number]- Vasopneumatic device, [id number]- Ultrasound, [id number]- Traction (mechanical), [id number]- Ionotophoresis 4mg /ml Dexamethasone , Patient/Family education, Balance training, Stair training, Taping, Dry Needling, Joint mobilization, Joint manipulation, Spinal manipulation, Spinal mobilization, Scar mobilization, DME instructions, Wheelchair mobility training, Cryotherapy, Moist heat, and Biofeedback  Luella Sager, PT 02/11/24 8:44 AM

## 2024-02-13 ENCOUNTER — Ambulatory Visit: Admitting: Physical Therapy

## 2024-02-18 ENCOUNTER — Encounter: Payer: Self-pay | Admitting: Family Medicine

## 2024-02-19 ENCOUNTER — Encounter: Payer: Self-pay | Admitting: Neurology

## 2024-02-20 ENCOUNTER — Encounter: Admitting: Physical Therapy

## 2024-02-20 ENCOUNTER — Encounter: Payer: Self-pay | Admitting: Physical Therapy

## 2024-02-20 ENCOUNTER — Ambulatory Visit: Admitting: Physical Therapy

## 2024-02-20 DIAGNOSIS — R293 Abnormal posture: Secondary | ICD-10-CM

## 2024-02-20 DIAGNOSIS — R279 Unspecified lack of coordination: Secondary | ICD-10-CM

## 2024-02-20 DIAGNOSIS — M6281 Muscle weakness (generalized): Secondary | ICD-10-CM | POA: Diagnosis not present

## 2024-02-20 DIAGNOSIS — R102 Pelvic and perineal pain: Secondary | ICD-10-CM

## 2024-02-20 DIAGNOSIS — M62838 Other muscle spasm: Secondary | ICD-10-CM

## 2024-02-20 DIAGNOSIS — R269 Unspecified abnormalities of gait and mobility: Secondary | ICD-10-CM

## 2024-02-20 NOTE — Therapy (Signed)
 OUTPATIENT PHYSICAL THERAPY FEMALE PELVIC TREATMENT   Patient Name: Anna Ramsey MRN: [id number] DOB:28-Sep-1965, 58 y.o., female Today's Date: 02/20/2024  END OF SESSION:  PT End of Session - 02/20/24 0940     Visit Number 9    Date for PT Re-Evaluation 07/17/24    Authorization Type [employer]    PT Start Time [phone]    PT Stop Time 0930    PT Time Calculation (min) 39 min    Activity Tolerance Patient tolerated treatment well    Behavior During Therapy [facility] for tasks assessed/performed                  Past Medical History:  Diagnosis Date   ADHD (attention deficit hyperactivity disorder) 2024   Dysuria    Erythematous bladder mucosa    Heart murmur    asymptomatic per pt --  no echo   Hematuria    History of cervical cancer    12/ 2014  Stage IB2--  s/p  TAH w/ BSO and pelvic lymphadectomy/  and radiation therapy   History of radiation therapy 10/20/13-11/28/13   pelvis 50.4 gray   Hypertension    Lesion of bladder    Mild intermittent asthma    Neuromuscular disorder (HCC)    Seasonal allergies    Thyroid disease    Past Surgical History:  Procedure Laterality Date   CYSTOSCOPY WITH BIOPSY N/A 04/29/2015   Procedure: CYSTOSCOPY WITH BIOPSY WITH FULGERATION;  Surgeon: Marco Severs, MD;  Location: [facility];  Service: Urology;  Laterality: N/A;  1 HR [phone] [id number]   FOOT SURGERY Left 2010   RADICAL ABDOMINAL HYSTERECTOMY  09-05-2013   w/ BILATERAL SALPINGOOPHORECTOMY AND PELVIC LYMPHADECTOMY   Patient Active Problem List   Diagnosis Date Noted   SUI (stress urinary incontinence, female) 11/01/2023   History of urinary retention 11/01/2023   Neurogenic bladder 10/18/2023   Hematuria 10/18/2023   Acute urinary retention 10/18/2023   Preventive measure 12/20/2022   Stress and adjustment reaction 01/13/2021   Inattention 10/28/2019   Hypothyroid 11/13/2016   Neuropathy involving both lower extremities 11/13/2016    Cystitis, radiation 05/22/2014   Cervical cancer (HCC) 09/01/2013   Adenocarcinoma of cervix, 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   Asthma, mild intermittent 07/07/2009   INSOMNIA 05/28/2008   Generalized anxiety disorder 05/07/2008   HYPERTENSION, BENIGN 05/31/2007    PCP: Mickel Alanis, MD  REFERRING PROVIDER: Merriam Abbey, DO   REFERRING DIAG:  [id number] (ICD-10-CM) - Bilateral leg weakness G62.89 (ICD-10-CM) - 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 Feeling pretty good today.    Eval: Right leg worse than left but both painful. Right has instances of knee just giving out, has neuropathy - like weakness, pain since radiation in 2015. The nerves have been affected and greatly limiting her activity. Does have symptoms of numbness, sensory deficits, weakness, ataxic gait, and painful but all vary based on the day.     Fluid intake: Yes: water    PAIN:  Are you having pain? Nothing new NPRS scale: did not rate Pain location: bil legs Right leg  Pain type: aching Pain description: constant   Aggravating factors: walking, end of day, rainy days Relieving factors: try not to walk, decreasing activities    PRECAUTIONS: Other: cervical cancer with radiation  RED FLAGS: None   WEIGHT BEARING RESTRICTIONS: No  FALLS:  Has patient fallen in last 6 months? Yes.  Number of falls 1x/weekly due to weakness and decreased balance from having the radiation, trouble with low light - reports she does need to furniture walk to help with balance  LIVING ENVIRONMENT: Lives with: lives with their family  OCCUPATION: sits with laptop on her lap for 12 hours per day; unable to sit with legs down due to them falling asleep  PLOF: Independent  PATIENT GOALS: to strengthening enough to walk across grass, wants to have better balance, stronger legs                                        PERTINENT HISTORY:  ADHD; Cervical Cancer 2014; 9 weeks of radiation; Radical hysterectomy;    OBJECTIVE:  Note: Objective measures were completed at Evaluation unless otherwise noted.  DIAGNOSTIC FINDINGS:   PATIENT SURVEYS:  Lower Extremity Functional Score: 5 / 80 = 6.3 %  COGNITION: Overall cognitive status: Within functional limits for tasks assessed     SENSATION: Poor sensation at Rt LE - light touch felt along posterior gastroc only no awareness without visualization of being touched at medial, lateral, anterior lower leg, reports numbness in bil feet Rt>Lt  EDEMA:  Rt ankle sometimes swells per pt  MUSCLE LENGTH: Bil hamstrings and adductors limited by 25%;  POSTURE: rounded shoulders and forward head  LOWER EXTREMITY ROM:  weakness and poor coordination/proprioception   LOWER EXTREMITY MMT:  02/04/24 hip flexion was R 3/5, L 4/5  MMT Right Eval */5 Left Eval */5 Right 5/16 Left 5/16  Hip flexion 2 3 4+ 5  Hip extension 2 3 4+ 4  Hip abduction 3 3+ 5- 5  Hip adduction 1+ 2+ 2+ 2+  Hip internal rotation      Hip external rotation      Knee flexion 2 3 3- 4  Knee extension 2+ 3 3- 5  Ankle dorsiflexion 0 4 0 4+ long sitting  Ankle plantarflexion 3 4    Ankle inversion 1 4 3+ tested in long sitting 4+ tested in long sitting  Ankle eversion 2 4 3+ tested in long sitting 4- tested in long sitting   (Blank rows = not tested)   FUNCTIONAL  TESTS:  Five times Sit to Stand Test (FTSS) Method: Use a straight back chair with a solid seat that is 16-18" high. Ask participant to sit on the chair with arms folded across their chest.   Instructions: "Stand up and sit down  as quickly as possible 5 times, keeping your arms folded across your chest."   Measurement: Stop timing when the participant stands the 5th time.  TIME: ______ (in seconds)  Times > 13.6 seconds is associated with increased disability and morbidity (Guralnik, 2000) Times > 15 seconds is predictive of recurrent falls in healthy individuals aged 15 and older (Buatois, et al., 2008) Normal performance values in community dwelling individuals aged 5 and older (Bohannon, 2006): 50-59 years: 8 seconds 60-69 years: 11.4 seconds 70-79 years: 12.6 seconds 80-89 years: 14.8 seconds  MCID: >= 2.3 seconds for Vestibular Disorders (Meretta, 2006)  5 times sit to stand: 22s with hands - very unstable with poor hip, posture, core control  Timed up and go (TUG): 28s with SBQC - min A  GAIT: Distance walked: 200' Assistive device utilized: Counselling psychologist Level of assistance: CGA Comments: Rt LE demonstrating ataxic pattern, Lt trunk lean intermittently  to right self, poor step height bil, decreased stride length, limited glute activation                                                                                                                                 TREATMENT DATE:   02/20/24:Pt arrives for aquatic physical therapy. Treatment took place in 3.5-5.5 feet of water . Water  temperature was. Pt entered the pool via stairs with heavy use of railings, step to step: PTA SBA. Pt requires buoyancy of water  for support and to offload joints with strengthening exercises.  Seated water  bench with 75% submersion Pt performed seated LE AROM exercises 20x in all planes, concurrent discussion of current status. 75% depth water  walking with PTA CBA at pelvis. 4 lengths in  each direction, pt requires small noodle for balance. Seated decompression with large noodle behind patient. PTA providing manual pelvic stability 2 min.  02/11/2024 Nu step seat 4,UE 9, Lev 5 x 6 min, therapist assisting hip to maintain neutral Ankle strengthening: hooklying over bolster B Inv  squeezing ball between feet 5 sec hold --> active Inv/ever Bridge with blue loop around thighs x 10  Adductor squeeze in supine x10 Standing at barre: hip ADDuction with foot on slider - also did diagonals, forward and backward x5 each  Leg press: 45# bil x20 seat 4, 20# Lt and Rt x10 each with emphasis on alignment and control Heel raises 2 x 10 feet straight and toe out HS curls standing x15 Sit to stand to barre x 2x5 - blue loop around thighs to activate glutes, pad added to seat    02/08/2024 Nu step seat 4,UE 9, Lev 5 x 6 min, therapist assisting hip to maintain neutral MMT completed Ankle strengthening: hooklying over bolster B Inv  squeezing ball between feet 5 sec hold --> active Inv/ever Long sitting ankle pumps Adductor squeeze in supine Standing hip ADDuction with foot on slider - also did diagonals, forward and backward Gastroc stretch B on incline board in fixed  position on wall Attempted various soleus stretches in standing, sitting and supine - none were effective     PATIENT EDUCATION:  Education details: [id number] - uses App Person educated: Patient Education method: Explanation, Demonstration, Tactile cues, Verbal cues, and Handouts Education comprehension: verbalized understanding, returned demonstration, verbal cues required, tactile cues required, and needs further education  HOME EXERCISE PROGRAM: Access Code: [email or web address] URL: [email or web address] Date: 02/04/2024 Prepared by: Concha Deed  Exercises - Supine Heel Slide with Strap  - 1 x daily - 7 x weekly - 3 sets - 10 reps - Pillow squeezes with kegel  - 1 x daily - 7 x weekly - 3 sets - 10 reps - Sit to  Stand with Counter Support  - 1 x daily - 7 x weekly - 1 sets - 10 reps - Seated Long Arc Quad  - 1 x daily - 7 x weekly - 1-3 sets - 10 reps - 5 sec hold - Seated Isometric Hip Abduction with Resistance  - 1 x daily - 7 x weekly - 1-3 sets - 10 reps - Heel Raises with Counter Support  - 1 x daily - 7 x weekly - 1-3 sets - 10 reps - Standing Terminal Knee Extension at Wall with Ball  - 2 x daily - 7 x weekly - 2 sets - 10 reps - 5 sec hold - Squat with Chair Touch and Resistance Loop  - 1 x daily - 3 x weekly - 2 sets - 10 reps - Standing Knee Flexion with Counter Support  - 1 x daily - 3 x weekly - 2 sets - 10 reps - Supine Bridge with Resistance Band  - 1 x daily - 7 x weekly - 1-2 sets - 10 reps - 5 sec hold  ASSESSMENT:  CLINICAL IMPRESSION: Pt arrives to aquatic PT with her usual LE symptoms. Symptoms did not increase/exacerabate during exercise. It does take pt longer to walk in the water  due to the force provided to her LE. She requires CGA today for safety and some help with balance.     Eval: Patient is a 58 y.o. female  who was seen today for physical therapy evaluation and treatment for poor gait mechanics, impaired posture, impaired balance, decreased hip and core strength, increased fall risk. Pt has complex history of  Radiation-induced lumbosacral plexopathy and Polyradiculoneuropathy status post cervical cancer. Pt has completed pelvic floor PT recently and had positive outcomes with this and resolution of symptoms. Pt very motivated to participate in PT for bil LE strengthening and decreased fall risk. Pt demonstrated impairments with objective measurements of TUG and 5xSTS indicative of increased fall risk and balance deficits. Pt reports she is unable to hold and carry grandchildren, to walk on uneven surfaces, needs assistance with dressing, walking, transfers sometimes and wants to be more I. Pt would benefit from additional PT to further address deficits.    OBJECTIVE  IMPAIRMENTS: Abnormal gait, decreased activity tolerance, decreased coordination, decreased endurance, decreased mobility, difficulty walking, decreased strength, increased fascial restrictions, increased muscle spasms, impaired flexibility, impaired sensation, improper body mechanics, postural dysfunction, and pain.   ACTIVITY LIMITATIONS: carrying, lifting, bending, sitting, standing, squatting, stairs, transfers, bed mobility, locomotion level, and caring for others  PARTICIPATION LIMITATIONS: meal prep, cleaning, laundry, interpersonal relationship, driving, shopping, community activity, occupation, and yard work  PERSONAL FACTORS: Fitness, Past/current experiences, Time since onset of injury/illness/exacerbation, and 1 comorbidity: medical history are also affecting patient's functional outcome.   REHAB POTENTIAL: Good  CLINICAL DECISION MAKING:  Evolving/moderate complexity  EVALUATION COMPLEXITY: Moderate   GOALS: Goals reviewed with patient? Yes  SHORT TERM GOALS: Target date: 02/13/24 Pt to be I with HEP for carry over and continuing recommendations for improved outcomes.   Baseline: Goal status: INITIAL  2.  Pt to demonstrate at least 3/5 Rt LE strength grossly and 4/5 Lt for improved pelvic stability and functional squats without falling.  Baseline:  Goal status: INITIAL  3.  Pt to demonstrate ability to complete static standing without AD for at least 5 mins at midline to complete gentle kitchen tasks/household tasks without fear of falling.  Baseline:  Goal status: MET  4.  Pt to demonstrate I with dynamic sitting balance to better be able to reach floor level for retrieving fallen objects and putting on socks without falling Baseline:  Goal status: IN PROGRESS 02/04/24  LONG TERM GOALS: Target date: 07/17/24  Pt to be I with advanced HEP for carry over and continuing recommendations for improved outcomes.   Baseline:  Goal status: INITIAL  2.  Pt to report improved  LEFS score to at least 30/80 for improved I with functional tasks at home such as dressing, walking from room to room.  Baseline:  Goal status: INITIAL  3.  Pt to demonstrate improved dynamic standing balance with or without AD at no more than CGA to better be able to play with grandchildren without falling.  Baseline:  Goal status: INITIAL  4.  Pt to demonstrate improved gait mechanics with LRAD for 150' with minimal compensate strategies to I with pt safety with ambulating into a store.  Baseline:  Goal status: INITIAL  5.  Pt to demonstrate ability to perform x5 Sit to stand with at least 10# to more safely assist in caring for grandchildren without risk of falling.  Baseline:  Goal status: INITIAL    PLAN:  PT FREQUENCY: 2x/week  PT DURATION: 12 weeks  PLANNED INTERVENTIONS: See how pt did post aquatics.   [id number]-Therapeutic exercises, 97530- Therapeutic activity, [id number]- Neuromuscular re-education, [phone]- Self Care, [id number]- Manual therapy, [phone]- Gait training, [phone]- Canalith repositioning, [phone]- Aquatic Therapy, [phone]- Splinting, [id number]- Electrical stimulation (manual), [id number]- Vasopneumatic device, [id number]- Ultrasound, [id number]- Traction (mechanical), [id number]- Ionotophoresis 4mg /ml Dexamethasone , Patient/Family education, Balance training, Stair training, Taping, Dry Needling, Joint mobilization, Joint manipulation, Spinal manipulation, Spinal mobilization, Scar mobilization, DME instructions, Wheelchair mobility training, Cryotherapy, Moist heat, and Biofeedback  Bethanne Brooks, PTA 02/20/24 9:41 AM

## 2024-02-22 ENCOUNTER — Ambulatory Visit: Admitting: Physical Therapy

## 2024-02-22 DIAGNOSIS — M6281 Muscle weakness (generalized): Secondary | ICD-10-CM | POA: Diagnosis not present

## 2024-02-22 DIAGNOSIS — R269 Unspecified abnormalities of gait and mobility: Secondary | ICD-10-CM

## 2024-02-22 NOTE — Therapy (Signed)
 OUTPATIENT PHYSICAL THERAPY FEMALE PELVIC TREATMENT   Patient Name: Anna Ramsey MRN: [id number] DOB:04-Aug-1966, 57 y.o., female Today's Date: 02/22/2024  END OF SESSION:  PT End of Session - 02/22/24 0756     Visit Number 10    Date for PT Re-Evaluation 07/17/24    Authorization Type [employer]    PT Start Time 0757    PT Stop Time 0835    PT Time Calculation (min) 38 min    Activity Tolerance Patient tolerated treatment well                  Past Medical History:  Diagnosis Date   ADHD (attention deficit hyperactivity disorder) 2024   Dysuria    Erythematous bladder mucosa    Heart murmur    asymptomatic per pt --  no echo   Hematuria    History of cervical cancer    12/ 2014  Stage IB2--  s/p  TAH w/ BSO and pelvic lymphadectomy/  and radiation therapy   History of radiation therapy 10/20/13-11/28/13   pelvis 50.4 gray   Hypertension    Lesion of bladder    Mild intermittent asthma    Neuromuscular disorder (HCC)    Seasonal allergies    Thyroid disease    Past Surgical History:  Procedure Laterality Date   CYSTOSCOPY WITH BIOPSY N/A 04/29/2015   Procedure: CYSTOSCOPY WITH BIOPSY WITH FULGERATION;  Surgeon: Marco Severs, MD;  Location: [facility];  Service: Urology;  Laterality: N/A;  1 HR [phone] [id number]   FOOT SURGERY Left 2010   RADICAL ABDOMINAL HYSTERECTOMY  09-05-2013   w/ BILATERAL SALPINGOOPHORECTOMY AND PELVIC LYMPHADECTOMY   Patient Active Problem List   Diagnosis Date Noted   SUI (stress urinary incontinence, female) 11/01/2023   History of urinary retention 11/01/2023   Neurogenic bladder 10/18/2023   Hematuria 10/18/2023   Acute urinary retention 10/18/2023   Preventive measure 12/20/2022   Stress and adjustment reaction 01/13/2021   Inattention 10/28/2019   Hypothyroid 11/13/2016   Neuropathy involving both lower extremities 11/13/2016   Cystitis, radiation 05/22/2014   Cervical cancer (HCC)  09/01/2013   Adenocarcinoma of cervix, 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   Asthma, mild intermittent 07/07/2009   INSOMNIA 05/28/2008   Generalized anxiety disorder 05/07/2008   HYPERTENSION, BENIGN 05/31/2007    PCP: Mickel Alanis, MD  REFERRING PROVIDER: Merriam Abbey, DO   REFERRING DIAG:  [phone] (ICD-10-CM) - Bilateral leg weakness G62.89 (ICD-10-CM) - 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  Was shocked she had some muscle soreness and calf tightness and I felt like I really did something.  I liked that it worked on my balance.  I think I'm 50% better with it than the last time I tried it. Another shocking revelation, this morning I was able to roll over in bed without pulling on the bed, it was a great moment!    Eval: Right leg worse than left but both painful. Right has instances of knee just giving out, has neuropathy - like weakness, pain since radiation in 2015. The nerves have been affected and greatly limiting her activity. Does have symptoms of numbness, sensory deficits, weakness, ataxic gait, and painful but all vary based on the day.     Fluid intake: Yes: water    PAIN:  Are you having pain? No NPRS scale:0 Pain location: bil legs Right leg  Pain type: aching Pain description: constant   Aggravating factors: walking, end of day, rainy days Relieving factors: try not to walk, decreasing activities    PRECAUTIONS: Other: cervical cancer with radiation  RED  FLAGS: None   WEIGHT BEARING RESTRICTIONS: No  FALLS:  Has patient fallen in last 6 months? Yes. Number of falls 1x/weekly due to weakness and decreased balance from having the radiation, trouble with low light - reports she does need to furniture walk to help with balance  LIVING ENVIRONMENT: Lives with: lives with their family  OCCUPATION: sits with laptop on her lap for 12 hours per day; unable to sit with legs down due to them falling asleep  PLOF: Independent  PATIENT GOALS: to strengthening enough to walk across grass, wants to have better balance, stronger legs                                        PERTINENT HISTORY:  ADHD; Cervical Cancer 2014; 9 weeks of radiation; Radical hysterectomy;    OBJECTIVE:  Note: Objective measures were completed at Evaluation unless otherwise noted.  DIAGNOSTIC FINDINGS:   PATIENT SURVEYS:  Lower Extremity Functional Score: 5 / 80 = 6.3 %  COGNITION: Overall cognitive status: Within functional limits for tasks assessed     SENSATION: Poor sensation at Rt LE - light touch felt along posterior gastroc only no awareness without visualization of being touched at medial, lateral, anterior lower leg, reports numbness in bil feet Rt>Lt  EDEMA:  Rt ankle sometimes swells per pt  MUSCLE LENGTH: Bil hamstrings and adductors limited by 25%;  POSTURE: rounded shoulders and forward head  LOWER EXTREMITY ROM:  weakness and poor coordination/proprioception   LOWER EXTREMITY MMT:  02/04/24 hip flexion was R 3/5, L 4/5  MMT Right Eval */5 Left Eval */5 Right 5/16 Left 5/16  Hip flexion 2 3 4+ 5  Hip extension 2 3 4+ 4  Hip abduction 3 3+ 5- 5  Hip adduction 1+ 2+ 2+ 2+  Hip internal rotation      Hip external rotation      Knee flexion 2 3 3- 4  Knee extension 2+ 3 3- 5  Ankle dorsiflexion 0 4 0 4+ long sitting  Ankle plantarflexion 3 4    Ankle inversion 1 4 3+ tested in long sitting 4+ tested in long sitting  Ankle eversion 2 4  3+ tested in long sitting 4- tested in long sitting   (Blank rows = not  tested)   FUNCTIONAL TESTS:  Five times Sit to Stand Test (FTSS) Method: Use a straight back chair with a solid seat that is 16-18" high. Ask participant to sit on the chair with arms folded across their chest.   Instructions: "Stand up and sit down as quickly as possible 5 times, keeping your arms folded across your chest."   Measurement: Stop timing when the participant stands the 5th time.  TIME: ______ (in seconds)  Times > 13.6 seconds is associated with increased disability and morbidity (Guralnik, 2000) Times > 15 seconds is predictive of recurrent falls in healthy individuals aged 19 and older (Buatois, et al., 2008) Normal performance values in community dwelling individuals aged 63 and older (Bohannon, 2006): 50-59 years: 8 seconds 60-69 years: 11.4 seconds 70-79 years: 12.6 seconds 80-89 years: 14.8 seconds  MCID: >= 2.3 seconds for Vestibular Disorders (Meretta, 2006)  5 times sit to stand: 22s with hands - very unstable with poor hip, posture, core control  Timed up and go (TUG): 28s with SBQC - min A  GAIT: Distance walked: 200' Assistive device utilized: Quad cane small base Level of assistance: CGA Comments: Rt LE demonstrating ataxic pattern, Lt trunk lean intermittently  to right self, poor step height bil, decreased stride length, limited glute activation                                                                                                                                 TREATMENT DATE:  02/22/2024 Nu step seat 4,UE 9, Lev 4 x 6 min,initially had difficulty keeping hip in neutral but improved with time; discussed response to treatment/status Ankle strengthening: hooklying over bolster B Inv  squeezing ball between feet 5 sec hold -10x Bridge with blue loop around thighs and ball between knees to help with alignment x 10  Adductor ball squeeze in supine x10 hooklying blue loop  above knees: single side clam keeping other side still 10x  Sidelying ball push down with palm with clam with blue band around thighs 10x on each side Seated pink power cord pull down and hold isometrically while marching 10x Leg press: 45# bil x20 seat 4, 20# Lt and Rt x10 each with emphasis on alignment and control (cues to avoid knee hyperextension on right) Leg press: 20# toggling from one foot to the other by raising heels 20x  02/20/24:Pt arrives for aquatic physical therapy. Treatment took place in 3.5-5.5 feet of water . Water  temperature was. Pt entered the pool via stairs with heavy use of railings, step to step: PTA SBA. Pt requires buoyancy of water  for support and to offload joints with strengthening exercises.  Seated water  bench with 75% submersion Pt performed seated LE AROM exercises 20x in all planes, concurrent discussion of current status. 75% depth water  walking with PTA CBA at pelvis. 4 lengths in each direction, pt requires small noodle for balance. Seated decompression with large noodle behind patient. PTA providing manual  pelvic stability 2 min.  02/11/2024 Nu step seat 4,UE 9, Lev 5 x 6 min, therapist assisting hip to maintain neutral Ankle strengthening: hooklying over bolster B Inv  squeezing ball between feet 5 sec hold --> active Inv/ever Bridge with blue loop around thighs x 10  Adductor squeeze in supine x10 Standing at barre: hip ADDuction with foot on slider - also did diagonals, forward and backward x5 each  Leg press: 45# bil x20 seat 4, 20# Lt and Rt x10 each with emphasis on alignment and control Heel raises 2 x 10 feet straight and toe out HS curls standing x15 Sit to stand to barre x 2x5 - blue loop around thighs to activate glutes, pad added to seat    02/08/2024 Nu step seat 4,UE 9, Lev 5 x 6 min, therapist assisting hip to maintain neutral MMT completed Ankle strengthening: hooklying over bolster B Inv  squeezing ball between feet 5 sec hold --> active  Inv/ever Long sitting ankle pumps Adductor squeeze in supine Standing hip ADDuction with foot on slider - also did diagonals, forward and backward Gastroc stretch B on incline board in fixed position on wall Attempted various soleus stretches in standing, sitting and supine - none were effective     PATIENT EDUCATION:  Education details: [id number] - uses App Person educated: Patient Education method: Explanation, Demonstration, Tactile cues, Verbal cues, and Handouts Education comprehension: verbalized understanding, returned demonstration, verbal cues required, tactile cues required, and needs further education  HOME EXERCISE PROGRAM: Access Code: [email or web address] URL: [email or web address] Date: 02/04/2024 Prepared by: Concha Deed  Exercises - Supine Heel Slide with Strap  - 1 x daily - 7 x weekly - 3 sets - 10 reps - Pillow squeezes with kegel  - 1 x daily - 7 x weekly - 3 sets - 10 reps - Sit to Stand with Counter Support  - 1 x daily - 7 x weekly - 1 sets - 10 reps - Seated Long Arc Quad  - 1 x daily - 7 x weekly - 1-3 sets - 10 reps - 5 sec hold - Seated Isometric Hip Abduction with Resistance  - 1 x daily - 7 x weekly - 1-3 sets - 10 reps - Heel Raises with Counter Support  - 1 x daily - 7 x weekly - 1-3 sets - 10 reps - Standing Terminal Knee Extension at Wall with Ball  - 2 x daily - 7 x weekly - 2 sets - 10 reps - 5 sec hold - Squat with Chair Touch and Resistance Loop  - 1 x daily - 3 x weekly - 2 sets - 10 reps - Standing Knee Flexion with Counter Support  - 1 x daily - 3 x weekly - 2 sets - 10 reps - Supine Bridge with Resistance Band  - 1 x daily - 7 x weekly - 1-2 sets - 10 reps - 5 sec hold  ASSESSMENT:  CLINICAL IMPRESSION: Treatment focus on lumbo/pelvic/hip stabilization.  Verbal cues to avoid compensatory hyperextension and proprioceptive feedback using the blue band and ball for alignment.  She denies pain on arrival, during or following the treatment session.  Patient reports functional improvements with bed mobility and transfers (decreased reliance on UE use to move Les).    Eval: Patient is a 58 y.o. female  who was seen today for physical therapy evaluation and treatment for poor gait mechanics, impaired posture, impaired balance, decreased hip and core strength, increased fall risk. Pt has complex history of  Radiation-induced  lumbosacral plexopathy and Polyradiculoneuropathy status post cervical cancer. Pt has completed pelvic floor PT recently and had positive outcomes with this and resolution of symptoms. Pt very motivated to participate in PT for bil LE strengthening and decreased fall risk. Pt demonstrated impairments with objective measurements of TUG and 5xSTS indicative of increased fall risk and balance deficits. Pt reports she is unable to hold and carry grandchildren, to walk on uneven surfaces, needs assistance with dressing, walking, transfers sometimes and wants to be more I. Pt would benefit from additional PT to further address deficits.    OBJECTIVE IMPAIRMENTS: Abnormal gait, decreased activity tolerance, decreased coordination, decreased endurance, decreased mobility, difficulty walking, decreased strength, increased fascial restrictions, increased muscle spasms, impaired flexibility, impaired sensation, improper body mechanics, postural dysfunction, and pain.   ACTIVITY LIMITATIONS: carrying, lifting, bending, sitting, standing, squatting, stairs, transfers, bed mobility, locomotion level, and caring for others  PARTICIPATION LIMITATIONS: meal prep, cleaning, laundry, interpersonal relationship, driving, shopping, community activity, occupation, and yard work  PERSONAL FACTORS: Fitness, Past/current experiences, Time since onset of injury/illness/exacerbation, and 1 comorbidity: medical history are also affecting patient's functional outcome.   REHAB POTENTIAL: Good  CLINICAL DECISION MAKING: Evolving/moderate  complexity  EVALUATION COMPLEXITY: Moderate   GOALS: Goals reviewed with patient? Yes  SHORT TERM GOALS: Target date: 02/13/24 Pt to be I with HEP for carry over and continuing recommendations for improved outcomes.   Baseline: Goal status: INITIAL  2.  Pt to demonstrate at least 3/5 Rt LE strength grossly and 4/5 Lt for improved pelvic stability and functional squats without falling.  Baseline:  Goal status: INITIAL  3.  Pt to demonstrate ability to complete static standing without AD for at least 5 mins at midline to complete gentle kitchen tasks/household tasks without fear of falling.  Baseline:  Goal status: MET  4.  Pt to demonstrate I with dynamic sitting balance to better be able to reach floor level for retrieving fallen objects and putting on socks without falling Baseline:  Goal status: IN PROGRESS 02/04/24  LONG TERM GOALS: Target date: 07/17/24  Pt to be I with advanced HEP for carry over and continuing recommendations for improved outcomes.   Baseline:  Goal status: INITIAL  2.  Pt to report improved LEFS score to at least 30/80 for improved I with functional tasks at home such as dressing, walking from room to room.  Baseline:  Goal status: INITIAL  3.  Pt to demonstrate improved dynamic standing balance with or without AD at no more than CGA to better be able to play with grandchildren without falling.  Baseline:  Goal status: INITIAL  4.  Pt to demonstrate improved gait mechanics with LRAD for 150' with minimal compensate strategies to I with pt safety with ambulating into a store.  Baseline:  Goal status: INITIAL  5.  Pt to demonstrate ability to perform x5 Sit to stand with at least 10# to more safely assist in caring for grandchildren without risk of falling.  Baseline:  Goal status: INITIAL    PLAN:  PT FREQUENCY: 2x/week  PT DURATION: 12 weeks  PLANNED INTERVENTIONS:    97110-Therapeutic exercises, 97530- Therapeutic activity, [id number]-  Neuromuscular re-education, 97535- Self Care, 65784- Manual therapy, [phone]- Gait training, [id number]- Canalith repositioning, [id number]- Aquatic Therapy, [phone]- Splinting, [id number]- Electrical stimulation (manual), [id number]- Vasopneumatic device, [id number]- Ultrasound, [id number]- Traction (mechanical), [id number]- Ionotophoresis 4mg /ml Dexamethasone , Patient/Family education, Balance training, Stair training, Taping, Dry Needling, Joint mobilization, Joint manipulation, Spinal manipulation, Spinal mobilization, Scar mobilization, DME instructions, Wheelchair mobility  training, Cryotherapy, Moist heat, and Biofeedback  NEXT VISIT: aquatic PT, core and LE strengthening and neuromuscular re-ed  Darien Eden, PT 02/22/24 8:39 AM Phone: [phone] Fax: [phone]

## 2024-02-24 NOTE — Therapy (Signed)
 OUTPATIENT PHYSICAL THERAPY FEMALE PELVIC TREATMENT   Patient Name: Anna Ramsey MRN: [id number] DOB:1966-03-16, 58 y.o., female Today's Date: 02/25/2024  END OF SESSION:  PT End of Session - 02/25/24 1535     Visit Number 11    Date for PT Re-Evaluation 07/17/24    Authorization Type [employer]    PT Start Time 1532    PT Stop Time 1615    PT Time Calculation (min) 43 min    Activity Tolerance Patient tolerated treatment well    Behavior During Therapy [facility] for tasks assessed/performed                   Past Medical History:  Diagnosis Date   ADHD (attention deficit hyperactivity disorder) 2024   Dysuria    Erythematous bladder mucosa    Heart murmur    asymptomatic per pt --  no echo   Hematuria    History of cervical cancer    12/ 2014  Stage IB2--  s/p  TAH w/ BSO and pelvic lymphadectomy/  and radiation therapy   History of radiation therapy 10/20/13-11/28/13   pelvis 50.4 gray   Hypertension    Lesion of bladder    Mild intermittent asthma    Neuromuscular disorder (HCC)    Seasonal allergies    Thyroid disease    Past Surgical History:  Procedure Laterality Date   CYSTOSCOPY WITH BIOPSY N/A 04/29/2015   Procedure: CYSTOSCOPY WITH BIOPSY WITH FULGERATION;  Surgeon: Marco Severs, MD;  Location: [facility];  Service: Urology;  Laterality: N/A;  1 HR [phone] [id number]   FOOT SURGERY Left 2010   RADICAL ABDOMINAL HYSTERECTOMY  09-05-2013   w/ BILATERAL SALPINGOOPHORECTOMY AND PELVIC LYMPHADECTOMY   Patient Active Problem List   Diagnosis Date Noted   SUI (stress urinary incontinence, female) 11/01/2023   History of urinary retention 11/01/2023   Neurogenic bladder 10/18/2023   Hematuria 10/18/2023   Acute urinary retention 10/18/2023   Preventive measure 12/20/2022   Stress and adjustment reaction 01/13/2021   Inattention 10/28/2019   Hypothyroid 11/13/2016   Neuropathy involving both lower extremities 11/13/2016    Cystitis, radiation 05/22/2014   Cervical cancer (HCC) 09/01/2013   Adenocarcinoma of cervix, 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   Asthma, mild intermittent 07/07/2009   INSOMNIA 05/28/2008   Generalized anxiety disorder 05/07/2008   HYPERTENSION, BENIGN 05/31/2007    PCP: Mickel Alanis, MD  REFERRING PROVIDER: Merriam Abbey, DO   REFERRING DIAG:  [phone] (ICD-10-CM) - Bilateral leg weakness G62.89 (ICD-10-CM) - Other polyneuropathy 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    Eval: Right leg worse than left but both painful. Right has instances of knee just giving out, has neuropathy - like weakness, pain since radiation in 2015. The nerves have been affected and greatly limiting her activity. Does have symptoms of numbness, sensory deficits, weakness, ataxic gait, and painful but all vary based on the day.     Fluid intake: Yes: water    PAIN:  Are you having pain? No NPRS scale:0 Pain location: bil legs Right leg  Pain type: aching Pain description: constant   Aggravating factors: walking, end of day, rainy days Relieving factors: try not to walk, decreasing activities    PRECAUTIONS: Other: cervical cancer with radiation  RED FLAGS: None   WEIGHT BEARING RESTRICTIONS: No  FALLS:  Has patient fallen in last 6 months? Yes. Number of falls 1x/weekly due to weakness and decreased balance  from having the radiation, trouble with low light - reports she does need to furniture walk to help with balance  LIVING ENVIRONMENT: Lives with: lives with their family  OCCUPATION: sits with laptop on her lap for 12 hours per day; unable to sit with legs down due to them falling asleep  PLOF: Independent  PATIENT GOALS: to strengthening enough to walk across grass, wants to have better balance, stronger legs                                        PERTINENT HISTORY:  ADHD; Cervical Cancer 2014; 9 weeks of radiation; Radical hysterectomy;    OBJECTIVE:  Note: Objective measures were completed at Evaluation unless otherwise noted.  DIAGNOSTIC FINDINGS:   PATIENT SURVEYS:  Lower Extremity Functional Score: 5 / 80 = 6.3 %  COGNITION: Overall cognitive status: Within functional limits for tasks assessed     SENSATION: Poor sensation at Rt LE - light touch felt along posterior gastroc only no awareness without visualization of being touched at medial, lateral, anterior lower leg, reports numbness in bil feet Rt>Lt  EDEMA:  Rt ankle sometimes swells per pt  MUSCLE LENGTH: Bil hamstrings and adductors limited by 25%;  POSTURE: rounded shoulders and forward head  LOWER EXTREMITY ROM:  weakness and poor coordination/proprioception   LOWER EXTREMITY MMT:  02/04/24 hip flexion was R 3/5, L 4/5  MMT Right Eval */5 Left Eval */5 Right 5/16 Left 5/16  Hip flexion 2 3 4+ 5  Hip extension 2 3 4+ 4  Hip abduction 3 3+ 5- 5  Hip adduction 1+ 2+ 2+ 2+  Hip internal rotation      Hip external rotation      Knee flexion 2 3 3- 4  Knee extension 2+ 3 3- 5  Ankle dorsiflexion 0 4 0 4+ long sitting  Ankle plantarflexion 3 4    Ankle inversion 1 4 3+ tested in long sitting 4+ tested in long sitting  Ankle eversion 2 4 3+ tested in long sitting 4- tested in long sitting   (Blank rows = not tested)   FUNCTIONAL TESTS:  Five times Sit to Stand Test (FTSS) Method: Use a straight  back chair with a solid seat that is 16-18" high. Ask participant to sit on the chair with arms folded across their chest.   Instructions: "Stand up and sit down as quickly as possible 5 times, keeping your arms folded across  your chest."   Measurement: Stop timing when the participant stands the 5th time.  TIME: ______ (in seconds)  Times > 13.6 seconds is associated with increased disability and morbidity (Guralnik, 2000) Times > 15 seconds is predictive of recurrent falls in healthy individuals aged 39 and older (Buatois, et al., 2008) Normal performance values in community dwelling individuals aged 43 and older (Bohannon, 2006): 50-59 years: 8 seconds 60-69 years: 11.4 seconds 70-79 years: 12.6 seconds 80-89 years: 14.8 seconds  MCID: >= 2.3 seconds for Vestibular Disorders (Meretta, 2006)  5 times sit to stand: 22s with hands - very unstable with poor hip, posture, core control  Timed up and go (TUG): 28s with SBQC - min A  GAIT: Distance walked: 200' Assistive device utilized: Quad cane small base Level of assistance: CGA Comments: Rt LE demonstrating ataxic pattern, Lt trunk lean intermittently  to right self, poor step height bil, decreased stride length, limited glute activation                                                                                                                                 TREATMENT DATE:  02/25/2024 Nu step seat 5,UE 9, Lev 5 x 6 min,initially had difficulty keeping hip in neutral but improved with time; discussed response to treatment/status Ankle strengthening: hooklying over bolster B Inv  squeezing ball between feet 5 sec hold -10x Bridge with blue loop around thighs and ball between knees to help with alignment (also L foot on 2 inch step)  x 5, then 1x2 before fatigued Adductor ball squeeze in supine x10 hooklying blue loop fig 8 above knees: single side clam keeping other side still 10x  Sidelying ball push down with palm with clam  with blue band fig 8 around thighs 10x on each side Seated pink power cord pull down and hold isometrically while marching 10x (band on thighs to help with alignment) Standing at barre: hip ADDuction with foot on slider - also did diagonals, forward and backward x5 each  Standing at barre: foot on slider - 1/2 circles (arc) x 5 ea each direction   02/22/2024 Nu step seat 4,UE 9, Lev 4 x 6 min,initially had difficulty keeping hip in neutral but improved with time; discussed response to treatment/status Ankle strengthening: hooklying over bolster B Inv  squeezing ball between feet 5 sec hold -10x Bridge with blue loop around thighs and ball between knees to help with alignment x 10  Adductor ball squeeze in supine x10 hooklying blue loop above knees: single side clam keeping other side still 10x  Sidelying ball push down with palm with clam with blue band around thighs 10x on each side Seated pink power cord pull down and hold isometrically while marching 10x  Leg press: 45# bil x20 seat 4, 20# Lt and Rt x10 each with emphasis on alignment and control (cues to avoid knee hyperextension on right) Leg press: 20# toggling from one foot  to the other by raising heels 20x  02/20/24:Pt arrives for aquatic physical therapy. Treatment took place in 3.5-5.5 feet of water . Water  temperature was. Pt entered the pool via stairs with heavy use of railings, step to step: PTA SBA. Pt requires buoyancy of water  for support and to offload joints with strengthening exercises.  Seated water  bench with 75% submersion Pt performed seated LE AROM exercises 20x in all planes, concurrent discussion of current status. 75% depth water  walking with PTA CBA at pelvis. 4 lengths in each direction, pt requires small noodle for balance. Seated decompression with large noodle behind patient. PTA providing manual pelvic stability 2 min.  02/11/2024 Nu step seat 4,UE 9, Lev 5 x 6 min, therapist assisting hip to maintain  neutral Ankle strengthening: hooklying over bolster B Inv  squeezing ball between feet 5 sec hold --> active Inv/ever Bridge with blue loop around thighs x 10  Adductor squeeze in supine x10 Standing at barre: hip ADDuction with foot on slider - also did diagonals, forward and backward x5 each  Leg press: 45# bil x20 seat 4, 20# Lt and Rt x10 each with emphasis on alignment and control Heel raises 2 x 10 feet straight and toe out HS curls standing x15 Sit to stand to barre x 2x5 - blue loop around thighs to activate glutes, pad added to seat    02/08/2024 Nu step seat 4,UE 9, Lev 5 x 6 min, therapist assisting hip to maintain neutral MMT completed Ankle strengthening: hooklying over bolster B Inv  squeezing ball between feet 5 sec hold --> active Inv/ever Long sitting ankle pumps Adductor squeeze in supine Standing hip ADDuction with foot on slider - also did diagonals, forward and backward Gastroc stretch B on incline board in fixed position on wall Attempted various soleus stretches in standing, sitting and supine - none were effective     PATIENT EDUCATION:  Education details: [id number] - uses App Person educated: Patient Education method: Explanation, Demonstration, Tactile cues, Verbal cues, and Handouts Education comprehension: verbalized understanding, returned demonstration, verbal cues required, tactile cues required, and needs further education  HOME EXERCISE PROGRAM: Access Code: [email or web address] URL: [email or web address] Date: 02/04/2024 Prepared by: Concha Deed  Exercises - Supine Heel Slide with Strap  - 1 x daily - 7 x weekly - 3 sets - 10 reps - Pillow squeezes with kegel  - 1 x daily - 7 x weekly - 3 sets - 10 reps - Sit to Stand with Counter Support  - 1 x daily - 7 x weekly - 1 sets - 10 reps - Seated Long Arc Quad  - 1 x daily - 7 x weekly - 1-3 sets - 10 reps - 5 sec hold - Seated Isometric Hip Abduction with Resistance  - 1 x daily - 7 x weekly - 1-3  sets - 10 reps - Heel Raises with Counter Support  - 1 x daily - 7 x weekly - 1-3 sets - 10 reps - Standing Terminal Knee Extension at Wall with Ball  - 2 x daily - 7 x weekly - 2 sets - 10 reps - 5 sec hold - Squat with Chair Touch and Resistance Loop  - 1 x daily - 3 x weekly - 2 sets - 10 reps - Standing Knee Flexion with Counter Support  - 1 x daily - 3 x weekly - 2 sets - 10 reps - Supine Bridge with Resistance Band  - 1 x daily - 7 x weekly - 1-2 sets -  10 reps - 5 sec hold  ASSESSMENT:  CLINICAL IMPRESSION: Patient demonstrates improved LE alignment on Nustep, but still demonstrates some hip ADDuction intermittently. Able to progress with some resistance in hooklying TE today. Modifications made to bridging activity to bias R LE. Overall patient is making good progress with strengthening. She fatigues fairly quickly if we stay with one muscle group too long.  Patient was able to ambulate around the clinic today without AD both at beginning and end of treatment session.   Eval: Patient is a 58 y.o. female  who was seen today for physical therapy evaluation and treatment for poor gait mechanics, impaired posture, impaired balance, decreased hip and core strength, increased fall risk. Pt has complex history of  Radiation-induced lumbosacral plexopathy and Polyradiculoneuropathy status post cervical cancer. Pt has completed pelvic floor PT recently and had positive outcomes with this and resolution of symptoms. Pt very motivated to participate in PT for bil LE strengthening and decreased fall risk. Pt demonstrated impairments with objective measurements of TUG and 5xSTS indicative of increased fall risk and balance deficits. Pt reports she is unable to hold and carry grandchildren, to walk on uneven surfaces, needs assistance with dressing, walking, transfers sometimes and wants to be more I. Pt would benefit from additional PT to further address deficits.    OBJECTIVE IMPAIRMENTS: Abnormal gait,  decreased activity tolerance, decreased coordination, decreased endurance, decreased mobility, difficulty walking, decreased strength, increased fascial restrictions, increased muscle spasms, impaired flexibility, impaired sensation, improper body mechanics, postural dysfunction, and pain.   ACTIVITY LIMITATIONS: carrying, lifting, bending, sitting, standing, squatting, stairs, transfers, bed mobility, locomotion level, and caring for others  PARTICIPATION LIMITATIONS: meal prep, cleaning, laundry, interpersonal relationship, driving, shopping, community activity, occupation, and yard work  PERSONAL FACTORS: Fitness, Past/current experiences, Time since onset of injury/illness/exacerbation, and 1 comorbidity: medical history are also affecting patient's functional outcome.   REHAB POTENTIAL: Good  CLINICAL DECISION MAKING: Evolving/moderate complexity  EVALUATION COMPLEXITY: Moderate   GOALS: Goals reviewed with patient? Yes  SHORT TERM GOALS: Target date: 02/13/24 Pt to be I with HEP for carry over and continuing recommendations for improved outcomes.   Baseline: Goal status: INITIAL  2.  Pt to demonstrate at least 3/5 Rt LE strength grossly and 4/5 Lt for improved pelvic stability and functional squats without falling.  Baseline:  Goal status: IN PROGRESS  3.  Pt to demonstrate ability to complete static standing without AD for at least 5 mins at midline to complete gentle kitchen tasks/household tasks without fear of falling.  Baseline:  Goal status: MET  4.  Pt to demonstrate I with dynamic sitting balance to better be able to reach floor level for retrieving fallen objects and putting on socks without falling Baseline:  Goal status: IN PROGRESS 02/04/24  LONG TERM GOALS: Target date: 07/17/24  Pt to be I with advanced HEP for carry over and continuing recommendations for improved outcomes.   Baseline:  Goal status: INITIAL  2.  Pt to report improved LEFS score to at least  30/80 for improved I with functional tasks at home such as dressing, walking from room to room.  Baseline:  Goal status: INITIAL  3.  Pt to demonstrate improved dynamic standing balance with or without AD at no more than CGA to better be able to play with grandchildren without falling.  Baseline:  Goal status: INITIAL  4.  Pt to demonstrate improved gait mechanics with LRAD for 150' with minimal compensate strategies to I with pt safety  with ambulating into a store.  Baseline:  Goal status: INITIAL  5.  Pt to demonstrate ability to perform x5 Sit to stand with at least 10# to more safely assist in caring for grandchildren without risk of falling.  Baseline:  Goal status: INITIAL    PLAN:  PT FREQUENCY: 2x/week  PT DURATION: 12 weeks  PLANNED INTERVENTIONS:    97110-Therapeutic exercises, 97530- Therapeutic activity, [id number]- Neuromuscular re-education, 97535- Self Care, 13086- Manual therapy, [phone]- Gait training, [phone]- Canalith repositioning, [id number]- Aquatic Therapy, [phone]- Splinting, [id number]- Electrical stimulation (manual), [id number]- Vasopneumatic device, [id number]- Ultrasound, [id number]- Traction (mechanical), [id number]- Ionotophoresis 4mg /ml Dexamethasone , Patient/Family education, Balance training, Stair training, Taping, Dry Needling, Joint mobilization, Joint manipulation, Spinal manipulation, Spinal mobilization, Scar mobilization, DME instructions, Wheelchair mobility training, Cryotherapy, Moist heat, and Biofeedback  NEXT VISIT: aquatic PT, core and LE strengthening and neuromuscular re-ed  Jinx Mourning, PT 02/25/24 5:31 PM Phone: [phone] Fax: [phone]

## 2024-02-25 ENCOUNTER — Encounter: Payer: Self-pay | Admitting: Physical Therapy

## 2024-02-25 ENCOUNTER — Ambulatory Visit: Attending: Neurology | Admitting: Physical Therapy

## 2024-02-25 DIAGNOSIS — M62838 Other muscle spasm: Secondary | ICD-10-CM | POA: Insufficient documentation

## 2024-02-25 DIAGNOSIS — R269 Unspecified abnormalities of gait and mobility: Secondary | ICD-10-CM | POA: Insufficient documentation

## 2024-02-25 DIAGNOSIS — R102 Pelvic and perineal pain: Secondary | ICD-10-CM | POA: Diagnosis present

## 2024-02-25 DIAGNOSIS — R279 Unspecified lack of coordination: Secondary | ICD-10-CM | POA: Diagnosis present

## 2024-02-25 DIAGNOSIS — R293 Abnormal posture: Secondary | ICD-10-CM | POA: Insufficient documentation

## 2024-02-25 DIAGNOSIS — M6281 Muscle weakness (generalized): Secondary | ICD-10-CM | POA: Diagnosis present

## 2024-02-26 ENCOUNTER — Other Ambulatory Visit: Payer: Self-pay | Admitting: *Deleted

## 2024-02-26 DIAGNOSIS — G5793 Unspecified mononeuropathy of bilateral lower limbs: Secondary | ICD-10-CM

## 2024-02-27 ENCOUNTER — Encounter: Payer: Self-pay | Admitting: Physical Therapy

## 2024-02-27 ENCOUNTER — Ambulatory Visit: Admitting: Physical Therapy

## 2024-02-27 ENCOUNTER — Encounter: Admitting: Physical Therapy

## 2024-02-27 DIAGNOSIS — R102 Pelvic and perineal pain: Secondary | ICD-10-CM

## 2024-02-27 DIAGNOSIS — M6281 Muscle weakness (generalized): Secondary | ICD-10-CM | POA: Diagnosis not present

## 2024-02-27 DIAGNOSIS — R269 Unspecified abnormalities of gait and mobility: Secondary | ICD-10-CM

## 2024-02-27 DIAGNOSIS — R293 Abnormal posture: Secondary | ICD-10-CM

## 2024-02-27 DIAGNOSIS — R279 Unspecified lack of coordination: Secondary | ICD-10-CM

## 2024-02-27 DIAGNOSIS — M62838 Other muscle spasm: Secondary | ICD-10-CM

## 2024-02-27 NOTE — Therapy (Signed)
 OUTPATIENT PHYSICAL THERAPY FEMALE PELVIC TREATMENT   Patient Name: Anna Ramsey MRN: [id number] DOB:Jan 29, 1966, 58 y.o., female Today's Date: 02/27/2024  END OF SESSION:  PT End of Session - 02/27/24 0844     Visit Number 12    Date for PT Re-Evaluation 07/17/24    Authorization Type [employer]    PT Start Time 0845    PT Stop Time 0934    PT Time Calculation (min) 49 min    Activity Tolerance Patient tolerated treatment well    Behavior During Therapy [facility] for tasks assessed/performed                    Past Medical History:  Diagnosis Date   ADHD (attention deficit hyperactivity disorder) 2024   Dysuria    Erythematous bladder mucosa    Heart murmur    asymptomatic per pt --  no echo   Hematuria    History of cervical cancer    12/ 2014  Stage IB2--  s/p  TAH w/ BSO and pelvic lymphadectomy/  and radiation therapy   History of radiation therapy 10/20/13-11/28/13   pelvis 50.4 gray   Hypertension    Lesion of bladder    Mild intermittent asthma    Neuromuscular disorder (HCC)    Seasonal allergies    Thyroid disease    Past Surgical History:  Procedure Laterality Date   CYSTOSCOPY WITH BIOPSY N/A 04/29/2015   Procedure: CYSTOSCOPY WITH BIOPSY WITH FULGERATION;  Surgeon: Marco Severs, MD;  Location: [facility];  Service: Urology;  Laterality: N/A;  1 HR [phone] [id number]   FOOT SURGERY Left 2010   RADICAL ABDOMINAL HYSTERECTOMY  09-05-2013   w/ BILATERAL SALPINGOOPHORECTOMY AND PELVIC LYMPHADECTOMY   Patient Active Problem List   Diagnosis Date Noted   SUI (stress urinary incontinence, female) 11/01/2023   History of urinary retention 11/01/2023   Neurogenic bladder 10/18/2023   Hematuria 10/18/2023   Acute urinary retention 10/18/2023   Preventive measure 12/20/2022   Stress and adjustment reaction 01/13/2021   Inattention 10/28/2019   Hypothyroid 11/13/2016   Neuropathy involving both lower extremities 11/13/2016    Cystitis, radiation 05/22/2014   Cervical cancer (HCC) 09/01/2013   Adenocarcinoma of cervix, 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   Asthma, mild intermittent 07/07/2009   INSOMNIA 05/28/2008   Generalized anxiety disorder 05/07/2008   HYPERTENSION, BENIGN 05/31/2007    PCP: Mickel Alanis, MD  REFERRING PROVIDER: Merriam Abbey, DO   REFERRING DIAG:  [id number] (ICD-10-CM) - Bilateral leg weakness G62.89 (ICD-10-CM) - Other polyneuropathy 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 I loved exercising in the pool last week.     Eval: Right leg worse than left but both painful. Right has instances of knee just giving out, has neuropathy - like weakness, pain since radiation in 2015. The nerves have been affected and greatly limiting her activity. Does have symptoms of numbness, sensory deficits, weakness, ataxic gait, and painful but all vary based on the day.     Fluid intake: Yes: water    PAIN:  Are you having pain? A lot of RTLE numbness, hips feel 'off." NPRS scale:0 Pain location: bil legs Right leg  Pain type: aching Pain description: constant   Aggravating factors: walking, end of day, rainy days Relieving factors: try not to walk, decreasing activities    PRECAUTIONS: Other: cervical cancer with radiation  RED FLAGS: None   WEIGHT BEARING RESTRICTIONS: No  FALLS:  Has  patient fallen in last 6 months? Yes. Number of falls 1x/weekly due to weakness and decreased balance from having the radiation, trouble with low light - reports she does need to furniture walk to help with balance  LIVING ENVIRONMENT: Lives with: lives with their family  OCCUPATION: sits with laptop on her lap for 12 hours per day; unable to sit with legs down due to them falling asleep  PLOF: Independent  PATIENT GOALS: to strengthening enough to walk across grass, wants to have better balance, stronger legs                                        PERTINENT HISTORY:  ADHD; Cervical Cancer 2014; 9 weeks of radiation; Radical hysterectomy;    OBJECTIVE:  Note: Objective measures were completed at Evaluation unless otherwise noted.  DIAGNOSTIC FINDINGS:   PATIENT SURVEYS:  Lower Extremity Functional Score: 5 / 80 = 6.3 %  COGNITION: Overall cognitive status: Within functional limits for tasks assessed     SENSATION: Poor sensation at Rt LE - light touch felt along posterior gastroc only no awareness without visualization of being touched at medial, lateral, anterior lower leg, reports numbness in bil feet Rt>Lt  EDEMA:  Rt ankle sometimes swells per pt  MUSCLE LENGTH: Bil hamstrings and adductors limited by 25%;  POSTURE: rounded shoulders and forward head  LOWER EXTREMITY ROM:  weakness and poor coordination/proprioception   LOWER EXTREMITY MMT:  02/04/24 hip flexion was R 3/5, L 4/5  MMT Right Eval */5 Left Eval */5 Right 5/16 Left 5/16  Hip flexion 2 3 4+ 5  Hip extension 2 3 4+ 4  Hip abduction 3 3+ 5- 5  Hip adduction 1+ 2+ 2+ 2+  Hip internal rotation      Hip external rotation      Knee flexion 2 3 3- 4  Knee extension 2+ 3 3- 5  Ankle dorsiflexion 0 4 0 4+ long sitting  Ankle plantarflexion 3 4    Ankle inversion 1 4 3+ tested in long sitting 4+ tested in long sitting  Ankle eversion 2 4 3+ tested in long sitting 4- tested in long sitting   (Blank  rows = not tested)   FUNCTIONAL TESTS:  Five times Sit to Stand Test (FTSS) Method: Use a straight back chair with a solid seat that is 16-18" high. Ask participant to sit on the chair with arms folded across their chest.   Instructions: "  Stand up and sit down as quickly as possible 5 times, keeping your arms folded across your chest."   Measurement: Stop timing when the participant stands the 5th time.  TIME: ______ (in seconds)  Times > 13.6 seconds is associated with increased disability and morbidity (Guralnik, 2000) Times > 15 seconds is predictive of recurrent falls in healthy individuals aged 24 and older (Buatois, et al., 2008) Normal performance values in community dwelling individuals aged 90 and older (Bohannon, 2006): 50-59 years: 8 seconds 60-69 years: 11.4 seconds 70-79 years: 12.6 seconds 80-89 years: 14.8 seconds  MCID: >= 2.3 seconds for Vestibular Disorders (Meretta, 2006)  5 times sit to stand: 22s with hands - very unstable with poor hip, posture, core control  Timed up and go (TUG): 28s with SBQC - min A  GAIT: Distance walked: 200' Assistive device utilized: Counselling psychologist Level of assistance: CGA Comments: Rt LE demonstrating ataxic pattern, Lt trunk lean intermittently  to right self, poor step height bil, decreased stride length, limited glute activation                                                                                                                                 TREATMENT DATE:   02/27/24:Pt arrives for aquatic physical therapy. Treatment took place in 3.5-5.5 feet of water . Water  temperature was. Pt entered the pool via stairs with heavy use of railings, step to step: PTA SBA. Pt requires buoyancy of water  for support and to offload joints with strengthening exercises.  Seated water  bench with 75% submersion Pt performed seated LE AROM exercises 20x in all planes, concurrent discussion of current status. 75% depth water  walking  with PTA CGA at pelvis. 6 lengths in each direction, pt requires small noodle for balance. Seated decompression with large noodle behind patient. PTA providing manual pelvic stability 2 min 2x bouts.  02/25/2024 Nu step seat 5,UE 9, Lev 5 x 6 min,initially had difficulty keeping hip in neutral but improved with time; discussed response to treatment/status Ankle strengthening: hooklying over bolster B Inv  squeezing ball between feet 5 sec hold -10x Bridge with blue loop around thighs and ball between knees to help with alignment (also L foot on 2 inch step)  x 5, then 1x2 before fatigued Adductor ball squeeze in supine x10 hooklying blue loop fig 8 above knees: single side clam keeping other side still 10x  Sidelying ball push down with palm with clam with blue band fig 8 around thighs 10x on each side Seated pink power cord pull down and hold isometrically while marching 10x (band on thighs to help with alignment) Standing at barre: hip ADDuction with foot on slider - also did diagonals, forward and backward x5 each  Standing at barre: foot on slider - 1/2 circles (arc) x 5 ea each direction   02/22/2024 Nu step seat 4,UE 9, Lev 4 x 6 min,initially had difficulty keeping hip in neutral but  improved with time; discussed response to treatment/status Ankle strengthening: hooklying over bolster B Inv  squeezing ball between feet 5 sec hold -10x Bridge with blue loop around thighs and ball between knees to help with alignment x 10  Adductor ball squeeze in supine x10 hooklying blue loop above knees: single side clam keeping other side still 10x  Sidelying ball push down with palm with clam with blue band around thighs 10x on each side Seated pink power cord pull down and hold isometrically while marching 10x  Leg press: 45# bil x20 seat 4, 20# Lt and Rt x10 each with emphasis on alignment and control (cues to avoid knee hyperextension on right) Leg press: 20# toggling from one foot to the other by  raising heels 20x    PATIENT EDUCATION:  Education details: [email or web address] - uses App Person educated: Patient Education method: Explanation, Demonstration, Tactile cues, Verbal cues, and Handouts Education comprehension: verbalized understanding, returned demonstration, verbal cues required, tactile cues required, and needs further education  HOME EXERCISE PROGRAM: Access Code: [email or web address] URL: [email or web address] Date: 02/04/2024 Prepared by: Concha Deed  Exercises - Supine Heel Slide with Strap  - 1 x daily - 7 x weekly - 3 sets - 10 reps - Pillow squeezes with kegel  - 1 x daily - 7 x weekly - 3 sets - 10 reps - Sit to Stand with Counter Support  - 1 x daily - 7 x weekly - 1 sets - 10 reps - Seated Long Arc Quad  - 1 x daily - 7 x weekly - 1-3 sets - 10 reps - 5 sec hold - Seated Isometric Hip Abduction with Resistance  - 1 x daily - 7 x weekly - 1-3 sets - 10 reps - Heel Raises with Counter Support  - 1 x daily - 7 x weekly - 1-3 sets - 10 reps - Standing Terminal Knee Extension at Wall with Ball  - 2 x daily - 7 x weekly - 2 sets - 10 reps - 5 sec hold - Squat with Chair Touch and Resistance Loop  - 1 x daily - 3 x weekly - 2 sets - 10 reps - Standing Knee Flexion with Counter Support  - 1 x daily - 3 x weekly - 2 sets - 10 reps - Supine Bridge with Resistance Band  - 1 x daily - 7 x weekly - 1-2 sets - 10 reps - 5 sec hold  ASSESSMENT:  CLINICAL IMPRESSION: Significant improvement in the quality of water  walking today: improved knee extension, less high hip flexion, better ankle DF/PF and less lateral instability. Of course she continues to use large floatation to assist in her balance.Pt will fatigue and require short rest breaks.   Eval: Patient is a 58 y.o. female  who was seen today for physical therapy evaluation and treatment for poor gait mechanics, impaired posture, impaired balance, decreased hip and core strength, increased fall risk. Pt has complex history of   Radiation-induced lumbosacral plexopathy and Polyradiculoneuropathy status post cervical cancer. Pt has completed pelvic floor PT recently and had positive outcomes with this and resolution of symptoms. Pt very motivated to participate in PT for bil LE strengthening and decreased fall risk. Pt demonstrated impairments with objective measurements of TUG and 5xSTS indicative of increased fall risk and balance deficits. Pt reports she is unable to hold and carry grandchildren, to walk on uneven surfaces, needs assistance with dressing, walking, transfers sometimes and wants to be more I. Pt would benefit from additional PT  to further address deficits.    OBJECTIVE IMPAIRMENTS: Abnormal gait, decreased activity tolerance, decreased coordination, decreased endurance, decreased mobility, difficulty walking, decreased strength, increased fascial restrictions, increased muscle spasms, impaired flexibility, impaired sensation, improper body mechanics, postural dysfunction, and pain.   ACTIVITY LIMITATIONS: carrying, lifting, bending, sitting, standing, squatting, stairs, transfers, bed mobility, locomotion level, and caring for others  PARTICIPATION LIMITATIONS: meal prep, cleaning, laundry, interpersonal relationship, driving, shopping, community activity, occupation, and yard work  PERSONAL FACTORS: Fitness, Past/current experiences, Time since onset of injury/illness/exacerbation, and 1 comorbidity: medical history are also affecting patient's functional outcome.   REHAB POTENTIAL: Good  CLINICAL DECISION MAKING: Evolving/moderate complexity  EVALUATION COMPLEXITY: Moderate   GOALS: Goals reviewed with patient? Yes  SHORT TERM GOALS: Target date: 02/13/24 Pt to be I with HEP for carry over and continuing recommendations for improved outcomes.   Baseline: Goal status: INITIAL  2.  Pt to demonstrate at least 3/5 Rt LE strength grossly and 4/5 Lt for improved pelvic stability and functional squats  without falling.  Baseline:  Goal status: IN PROGRESS  3.  Pt to demonstrate ability to complete static standing without AD for at least 5 mins at midline to complete gentle kitchen tasks/household tasks without fear of falling.  Baseline:  Goal status: MET  4.  Pt to demonstrate I with dynamic sitting balance to better be able to reach floor level for retrieving fallen objects and putting on socks without falling Baseline:  Goal status: IN PROGRESS 02/04/24  LONG TERM GOALS: Target date: 07/17/24  Pt to be I with advanced HEP for carry over and continuing recommendations for improved outcomes.   Baseline:  Goal status: INITIAL  2.  Pt to report improved LEFS score to at least 30/80 for improved I with functional tasks at home such as dressing, walking from room to room.  Baseline:  Goal status: INITIAL  3.  Pt to demonstrate improved dynamic standing balance with or without AD at no more than CGA to better be able to play with grandchildren without falling.  Baseline:  Goal status: INITIAL  4.  Pt to demonstrate improved gait mechanics with LRAD for 150' with minimal compensate strategies to I with pt safety with ambulating into a store.  Baseline:  Goal status: INITIAL  5.  Pt to demonstrate ability to perform x5 Sit to stand with at least 10# to more safely assist in caring for grandchildren without risk of falling.  Baseline:  Goal status: INITIAL    PLAN:  PT FREQUENCY: 2x/week  PT DURATION: 12 weeks  PLANNED INTERVENTIONS:    97110-Therapeutic exercises, 97530- Therapeutic activity, [id number]- Neuromuscular re-education, 97535- Self Care, [id number]- Manual therapy, [id number]- Gait training, [phone]- Canalith repositioning, [id number]- Aquatic Therapy, [phone]- Splinting, [id number]- Electrical stimulation (manual), [id number]- Vasopneumatic device, [id number]- Ultrasound, [id number]- Traction (mechanical), [id number]- Ionotophoresis 4mg /ml Dexamethasone , Patient/Family education, Balance training, Stair training,  Taping, Dry Needling, Joint mobilization, Joint manipulation, Spinal manipulation, Spinal mobilization, Scar mobilization, DME instructions, Wheelchair mobility training, Cryotherapy, Moist heat, and Biofeedback  NEXT VISIT: aquatic PT, core and LE strengthening and neuromuscular re-ed  Bethanne Brooks, PTA 02/27/24 9:34 AM

## 2024-03-02 NOTE — Therapy (Signed)
 OUTPATIENT PHYSICAL THERAPY FEMALE PELVIC AND ORTHO TREATMENT   Patient Name: Anna Ramsey MRN: [id number] DOB:Jan 18, 1966, 58 y.o., female Today's Date: 03/03/2024  END OF SESSION:  PT End of Session - 03/03/24 0758     Visit Number 13    Date for PT Re-Evaluation 07/17/24    Authorization Type [employer]    PT Start Time 0758    PT Stop Time 0843    PT Time Calculation (min) 45 min    Activity Tolerance Patient tolerated treatment well    Behavior During Therapy [facility] for tasks assessed/performed                     Past Medical History:  Diagnosis Date   ADHD (attention deficit hyperactivity disorder) 2024   Dysuria    Erythematous bladder mucosa    Heart murmur    asymptomatic per pt --  no echo   Hematuria    History of cervical cancer    12/ 2014  Stage IB2--  s/p  TAH w/ BSO and pelvic lymphadectomy/  and radiation therapy   History of radiation therapy 10/20/13-11/28/13   pelvis 50.4 gray   Hypertension    Lesion of bladder    Mild intermittent asthma    Neuromuscular disorder (HCC)    Seasonal allergies    Thyroid disease    Past Surgical History:  Procedure Laterality Date   CYSTOSCOPY WITH BIOPSY N/A 04/29/2015   Procedure: CYSTOSCOPY WITH BIOPSY WITH FULGERATION;  Surgeon: Marco Severs, MD;  Location: [facility];  Service: Urology;  Laterality: N/A;  1 HR [phone] [id number]   FOOT SURGERY Left 2010   RADICAL ABDOMINAL HYSTERECTOMY  09-05-2013   w/ BILATERAL SALPINGOOPHORECTOMY AND PELVIC LYMPHADECTOMY   Patient Active Problem List   Diagnosis Date Noted   SUI (stress urinary incontinence, female) 11/01/2023   History of urinary retention 11/01/2023   Neurogenic bladder 10/18/2023   Hematuria 10/18/2023   Acute urinary retention 10/18/2023   Preventive measure 12/20/2022   Stress and adjustment reaction 01/13/2021   Inattention 10/28/2019   Hypothyroid 11/13/2016   Neuropathy involving both lower  extremities 11/13/2016   Cystitis, radiation 05/22/2014   Cervical cancer (HCC) 09/01/2013   Adenocarcinoma of cervix, 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   Asthma, mild intermittent 07/07/2009   INSOMNIA 05/28/2008   Generalized anxiety disorder 05/07/2008   HYPERTENSION, BENIGN 05/31/2007    PCP: Mickel Alanis, MD  REFERRING PROVIDER: Merriam Abbey, DO   REFERRING DIAG:  [phone] (ICD-10-CM) - Bilateral leg weakness G62.89 (ICD-10-CM) - 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 I haven't been able to do that in 7 years.     Eval: Right leg worse than left but both painful. Right has instances of knee just giving out, has neuropathy - like weakness, pain since radiation in 2015. The nerves have been affected and greatly limiting her activity. Does have symptoms of numbness, sensory deficits, weakness, ataxic gait, and painful but all vary based on the day.     Fluid intake: Yes: water    PAIN:  Are you having pain? A lot of RTLE numbness, hips feel 'off." NPRS scale:0 Pain location: bil legs Right leg  Pain type: aching Pain description: constant   Aggravating factors: walking, end of day, rainy days Relieving factors: try not to walk, decreasing activities    PRECAUTIONS: Other: cervical cancer with radiation  RED FLAGS: None   WEIGHT BEARING RESTRICTIONS: No  FALLS:  Has  patient fallen in last 6 months? Yes. Number of falls 1x/weekly due to weakness and decreased balance from having the radiation, trouble with low light - reports she does need to furniture walk to help with balance  LIVING ENVIRONMENT: Lives with: lives with their family  OCCUPATION: sits with laptop on her lap for 12 hours per day; unable to sit with legs down due to them falling asleep  PLOF: Independent  PATIENT GOALS: to strengthening enough to walk across grass, wants to have better balance, stronger legs                                        PERTINENT HISTORY:  ADHD; Cervical Cancer 2014; 9 weeks of radiation; Radical hysterectomy;    OBJECTIVE:  Note: Objective measures were completed at Evaluation unless otherwise noted.  DIAGNOSTIC FINDINGS:   PATIENT SURVEYS:  Lower Extremity Functional Score: 5 / 80 = 6.3 %  COGNITION: Overall cognitive status: Within functional limits for tasks assessed     SENSATION: Poor sensation at Rt LE - light touch felt along posterior gastroc only no awareness without visualization of being touched at medial, lateral, anterior lower leg, reports numbness in bil feet Rt>Lt  EDEMA:  Rt ankle sometimes swells per pt  MUSCLE LENGTH: Bil hamstrings and adductors limited by 25%;  POSTURE: rounded shoulders and forward head  LOWER EXTREMITY ROM:  weakness and poor coordination/proprioception   LOWER EXTREMITY MMT:  02/04/24 hip flexion was R 3/5, L 4/5  MMT Right Eval */5 Left Eval */5 Right 5/16 Left 5/16 RIGHT 6/6 LEFT  6/6  Hip flexion 2 3 4+ 5 3   Hip extension 2 3 4+ 4 4+ 4  Hip abduction 3 3+ 5- 5 5   Hip adduction 1+ 2+ 2+ 2+ 2+ 2+  Hip internal rotation        Hip external rotation        Knee flexion 2 3 3- 4 3+ 5  Knee extension 2+ 3 3- 5 3+   Ankle dorsiflexion 0 4 0 4+ long sitting 2 4  4+ long sitting 2    Ankle plantarflexion 3 4      Ankle inversion 1 4 3+ tested in long sitting 4+ tested in long sitting  3+ in long sitting 5 in long sitting  Ankle eversion 2 4 3+ tested in long sitting 4-  tested in long sitting 2+ 4- tested in long sitting   (Blank rows = not tested)   FUNCTIONAL TESTS:  Five times Sit to Stand Test (FTSS) Method: Use a straight back chair with a solid seat that is 16-18" high. Ask participant to sit on the chair with arms folded across their chest.   Instructions: "Stand up and sit down as quickly as possible 5 times, keeping your arms folded across your chest."   Measurement: Stop timing when the participant stands the 5th time.  TIME: ______ (in seconds)  Times > 13.6 seconds is associated with increased disability and morbidity (Guralnik, 2000) Times > 15 seconds is predictive of recurrent falls in healthy individuals aged 71 and older (Buatois, et al., 2008) Normal performance values in community dwelling individuals aged 1 and older (Bohannon, 2006): 50-59 years: 8 seconds 60-69 years: 11.4 seconds 70-79 years: 12.6 seconds 80-89 years: 14.8 seconds  MCID: >= 2.3 seconds for Vestibular Disorders (Meretta, 2006)  5 times sit to stand: 22s with hands - very unstable with poor hip, posture, core control  Timed up and go (TUG): 28s with SBQC - min A  GAIT: Distance walked: 200' Assistive device utilized: Quad cane small base Level of assistance: CGA Comments: Rt LE demonstrating ataxic pattern, Lt trunk lean intermittently  to right self, poor step height bil, decreased stride length, limited glute activation                                                                                                                                 TREATMENT DATE:   03/03/2024 Nu step seat 5,UE 10, Lev 5 x 7 min STG and MMT assessed Ankle strengthening: hooklying over bolster B Inv  squeezing ball between feet 5 sec hold -10x Bridge with blue loop around thighs and ball between knees to help with alignment - (5-10 sec hold) x 10 Adductor ball squeeze with beach ball  in supine x10 hooklying blue loop fig 8 above knees: single side clam keeping other side still 10x  Seated pink power cord pull down and hold isometrically while marching to fatigue (band on thighs to help with alignment)    02/27/24:Pt arrives for aquatic physical therapy. Treatment took place in 3.5-5.5 feet of water . Water  temperature was. Pt entered the pool via stairs with heavy use of railings, step to step: PTA SBA. Pt requires buoyancy of water  for support and to offload joints with strengthening exercises.  Seated water  bench with 75% submersion Pt performed seated LE AROM exercises 20x in all planes, concurrent discussion of current status. 75% depth water  walking with PTA CGA at pelvis. 6 lengths in each direction, pt requires small noodle for balance. Seated decompression with large noodle behind patient. PTA providing manual pelvic stability 2 min 2x bouts.  02/25/2024 Nu step seat 5,UE 9, Lev 5 x 6 min,initially had difficulty keeping hip in neutral but improved with time; discussed response to treatment/status  Ankle strengthening: hooklying over bolster B Inv  squeezing ball between feet 5 sec hold -10x Bridge with blue loop around thighs and ball between knees to help with alignment (also L foot on 2 inch step)  x 5, then 1x2 before fatigued Adductor ball squeeze in supine x10 hooklying blue loop fig 8 above knees: single side clam keeping other side still 10x  Sidelying ball push down with palm with clam with blue band fig 8 around thighs 10x on each side Seated pink power cord pull down and hold isometrically while marching 10x (band on thighs to help with alignment) Standing at barre: hip ADDuction with foot on slider - also did diagonals, forward and backward x5 each  Standing at barre: foot on slider - 1/2 circles (arc) x 5 ea each direction   02/22/2024 Nu step seat 4,UE 9, Lev 4 x 6 min,initially had difficulty keeping hip in neutral but improved with time; discussed response  to treatment/status Ankle strengthening: hooklying over bolster B Inv  squeezing ball between feet 5 sec hold -10x Bridge with blue loop around thighs and ball between knees to help with alignment x 10  Adductor ball squeeze in supine x10 hooklying blue loop above knees: single side clam keeping other side still 10x  Sidelying ball push down with palm with clam with blue band around thighs 10x on each side Seated pink power cord pull down and hold isometrically while marching 10x  Leg press: 45# bil x20 seat 4, 20# Lt and Rt x10 each with emphasis on alignment and control (cues to avoid knee hyperextension on right) Leg press: 20# toggling from one foot to the other by raising heels 20x    PATIENT EDUCATION:  Education details: [id number] - uses App Person educated: Patient Education method: Explanation, Demonstration, Tactile cues, Verbal cues, and Handouts Education comprehension: verbalized understanding, returned demonstration, verbal cues required, tactile cues required, and needs further education  HOME EXERCISE PROGRAM: Access Code: [email or web address] URL: [email or web address] Date: 02/04/2024 Prepared by: Concha Deed  Exercises - Supine Heel Slide with Strap  - 1 x daily - 7 x weekly - 3 sets - 10 reps - Pillow squeezes with kegel  - 1 x daily - 7 x weekly - 3 sets - 10 reps - Sit to Stand with Counter Support  - 1 x daily - 7 x weekly - 1 sets - 10 reps - Seated Long Arc Quad  - 1 x daily - 7 x weekly - 1-3 sets - 10 reps - 5 sec hold - Seated Isometric Hip Abduction with Resistance  - 1 x daily - 7 x weekly - 1-3 sets - 10 reps - Heel Raises with Counter Support  - 1 x daily - 7 x weekly - 1-3 sets - 10 reps - Standing Terminal Knee Extension at Wall with Ball  - 2 x daily - 7 x weekly - 2 sets - 10 reps - 5 sec hold - Squat with Chair Touch and Resistance Loop  - 1 x daily - 3 x weekly - 2 sets - 10 reps - Standing Knee Flexion with Counter Support  - 1 x daily - 3 x weekly -  2 sets - 10 reps - Supine Bridge with Resistance Band  - 1 x daily - 7 x weekly - 1-2 sets - 10 reps - 5 sec hold  ASSESSMENT:  CLINICAL IMPRESSION: Patient continues to progress with core and LE strengthening both with MMT and functionally. She is able to squat to  pick items up off the floor using correct body mechanics resulting in no back pain. She is still very limited in hip ADDuction strength and DF. She continues to demonstrate potential for improvement and would benefit from continued skilled therapy to address impairments.     Eval: Patient is a 58 y.o. female  who was seen today for physical therapy evaluation and treatment for poor gait mechanics, impaired posture, impaired balance, decreased hip and core strength, increased fall risk. Pt has complex history of  Radiation-induced lumbosacral plexopathy and Polyradiculoneuropathy status post cervical cancer. Pt has completed pelvic floor PT recently and had positive outcomes with this and resolution of symptoms. Pt very motivated to participate in PT for bil LE strengthening and decreased fall risk. Pt demonstrated impairments with objective measurements of TUG and 5xSTS indicative of increased fall risk and balance deficits. Pt reports she is unable to hold and carry grandchildren, to walk on uneven surfaces, needs assistance with dressing, walking, transfers sometimes and wants to be more I. Pt would benefit from additional PT to further address deficits.    OBJECTIVE IMPAIRMENTS: Abnormal gait, decreased activity tolerance, decreased coordination, decreased endurance, decreased mobility, difficulty walking, decreased strength, increased fascial restrictions, increased muscle spasms, impaired flexibility, impaired sensation, improper body mechanics, postural dysfunction, and pain.   ACTIVITY LIMITATIONS: carrying, lifting, bending, sitting, standing, squatting, stairs, transfers, bed mobility, locomotion level, and caring for  others  PARTICIPATION LIMITATIONS: meal prep, cleaning, laundry, interpersonal relationship, driving, shopping, community activity, occupation, and yard work  PERSONAL FACTORS: Fitness, Past/current experiences, Time since onset of injury/illness/exacerbation, and 1 comorbidity: medical history are also affecting patient's functional outcome.   REHAB POTENTIAL: Good  CLINICAL DECISION MAKING: Evolving/moderate complexity  EVALUATION COMPLEXITY: Moderate   GOALS: Goals reviewed with patient? Yes  SHORT TERM GOALS: Target date: 02/13/24 Pt to be I with HEP for carry over and continuing recommendations for improved outcomes.   Baseline: Goal status: MET  2.  Pt to demonstrate at least 3/5 Rt LE strength grossly and 4/5 Lt for improved pelvic stability and functional squats without falling.  Baseline:  Goal status: IPARTIALLY MET 03/03/24  3.  Pt to demonstrate ability to complete static standing without AD for at least 5 mins at midline to complete gentle kitchen tasks/household tasks without fear of falling.  Baseline:  Goal status: MET  4.  Pt to demonstrate I with dynamic sitting balance to better be able to reach floor level for retrieving fallen objects and putting on socks without falling Baseline:  Goal status: MET 03/03/24  LONG TERM GOALS: Target date: 07/17/24  Pt to be I with advanced HEP for carry over and continuing recommendations for improved outcomes.   Baseline:  Goal status: INITIAL  2.  Pt to report improved LEFS score to at least 30/80 for improved I with functional tasks at home such as dressing, walking from room to room.  Baseline:  Goal status: INITIAL  3.  Pt to demonstrate improved dynamic standing balance with or without AD at no more than CGA to better be able to play with grandchildren without falling.  Baseline:  Goal status: INITIAL  4.  Pt to demonstrate improved gait mechanics with LRAD for 150' with minimal compensate strategies to I with pt  safety with ambulating into a store.  Baseline:  Goal status: INITIAL  5.  Pt to demonstrate ability to perform x5 Sit to stand with at least 10# to more safely assist in caring for grandchildren without risk of falling.  Baseline:  Goal status: INITIAL    PLAN:  PT FREQUENCY: 2x/week  PT DURATION: 12 weeks  PLANNED INTERVENTIONS:    97110-Therapeutic exercises, 97530- Therapeutic activity, [id number]- Neuromuscular re-education, 97535- Self Care, [id number]- Manual therapy, [id number]- Gait training, [phone]- Canalith repositioning, [id number]- Aquatic Therapy, [phone]- Splinting, [id number]- Electrical stimulation (manual), [id number]- Vasopneumatic device, [id number]- Ultrasound, [id number]- Traction (mechanical), [id number]- Ionotophoresis 4mg /ml Dexamethasone , Patient/Family education, Balance training, Stair training, Taping, Dry Needling, Joint mobilization, Joint manipulation, Spinal manipulation, Spinal mobilization, Scar mobilization, DME instructions, Wheelchair mobility training, Cryotherapy, Moist heat, and Biofeedback  NEXT VISIT: aquatic PT, core and LE strengthening and neuromuscular re-ed  Jinx Mourning, PT 03/03/24 1:41 PM

## 2024-03-03 ENCOUNTER — Encounter: Payer: Self-pay | Admitting: Physical Therapy

## 2024-03-03 ENCOUNTER — Ambulatory Visit: Admitting: Physical Therapy

## 2024-03-03 DIAGNOSIS — R269 Unspecified abnormalities of gait and mobility: Secondary | ICD-10-CM

## 2024-03-03 DIAGNOSIS — M6281 Muscle weakness (generalized): Secondary | ICD-10-CM

## 2024-03-03 DIAGNOSIS — R293 Abnormal posture: Secondary | ICD-10-CM

## 2024-03-05 ENCOUNTER — Encounter: Admitting: Physical Therapy

## 2024-03-05 ENCOUNTER — Ambulatory Visit: Admitting: Physical Therapy

## 2024-03-05 ENCOUNTER — Encounter: Payer: Self-pay | Admitting: Physical Therapy

## 2024-03-05 DIAGNOSIS — R293 Abnormal posture: Secondary | ICD-10-CM

## 2024-03-05 DIAGNOSIS — M62838 Other muscle spasm: Secondary | ICD-10-CM

## 2024-03-05 DIAGNOSIS — M6281 Muscle weakness (generalized): Secondary | ICD-10-CM

## 2024-03-05 DIAGNOSIS — R269 Unspecified abnormalities of gait and mobility: Secondary | ICD-10-CM

## 2024-03-05 DIAGNOSIS — R279 Unspecified lack of coordination: Secondary | ICD-10-CM

## 2024-03-05 DIAGNOSIS — R102 Pelvic and perineal pain: Secondary | ICD-10-CM

## 2024-03-05 NOTE — Therapy (Addendum)
 OUTPATIENT PHYSICAL THERAPY FEMALE PELVIC AND ORTHO TREATMENT   Patient Name: Anna Ramsey MRN: [id number] DOB:17-May-1966, 58 y.o., female Today's Date: 03/05/2024  END OF SESSION:  PT End of Session - 03/05/24 0836     Visit Number 14    Date for PT Re-Evaluation 07/17/24    Authorization Type [employer]    PT Start Time [phone]    PT Stop Time 0930    PT Time Calculation (min) 53 min    Activity Tolerance Patient tolerated treatment well    Behavior During Therapy [facility] for tasks assessed/performed                      Past Medical History:  Diagnosis Date   ADHD (attention deficit hyperactivity disorder) 2024   Dysuria    Erythematous bladder mucosa    Heart murmur    asymptomatic per pt --  no echo   Hematuria    History of cervical cancer    12/ 2014  Stage IB2--  s/p  TAH w/ BSO and pelvic lymphadectomy/  and radiation therapy   History of radiation therapy 10/20/13-11/28/13   pelvis 50.4 gray   Hypertension    Lesion of bladder    Mild intermittent asthma    Neuromuscular disorder (HCC)    Seasonal allergies    Thyroid disease    Past Surgical History:  Procedure Laterality Date   CYSTOSCOPY WITH BIOPSY N/A 04/29/2015   Procedure: CYSTOSCOPY WITH BIOPSY WITH FULGERATION;  Surgeon: Marco Severs, MD;  Location: [facility];  Service: Urology;  Laterality: N/A;  1 HR [phone] [id number]   FOOT SURGERY Left 2010   RADICAL ABDOMINAL HYSTERECTOMY  09-05-2013   w/ BILATERAL SALPINGOOPHORECTOMY AND PELVIC LYMPHADECTOMY   Patient Active Problem List   Diagnosis Date Noted   SUI (stress urinary incontinence, female) 11/01/2023   History of urinary retention 11/01/2023   Neurogenic bladder 10/18/2023   Hematuria 10/18/2023   Acute urinary retention 10/18/2023   Preventive measure 12/20/2022   Stress and adjustment reaction 01/13/2021   Inattention 10/28/2019   Hypothyroid 11/13/2016   Neuropathy involving both lower  extremities 11/13/2016   Cystitis, radiation 05/22/2014   Cervical cancer (HCC) 09/01/2013   Adenocarcinoma of cervix, 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   Asthma, mild intermittent 07/07/2009   INSOMNIA 05/28/2008   Generalized anxiety disorder 05/07/2008   HYPERTENSION, BENIGN 05/31/2007    PCP: Mickel Alanis, MD  REFERRING PROVIDER: Merriam Abbey, DO   REFERRING DIAG:  [phone] (ICD-10-CM) - Bilateral leg weakness G62.89 (ICD-10-CM) - 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 I am very happy with how things are going.    Eval: Right leg worse than left but both painful. Right has instances of knee just giving out, has neuropathy - like weakness, pain since radiation in 2015. The nerves have been affected and greatly limiting her activity. Does have symptoms of numbness, sensory deficits, weakness, ataxic gait, and painful but all vary based on the day.     Fluid intake: Yes: water    PAIN:  Are you having pain? A lot of RTLE numbness, hips feel 'off. NPRS scale:0 Pain location: bil legs Right leg  Pain type: aching Pain description: constant   Aggravating factors: walking, end of day, rainy days Relieving factors: try not to walk, decreasing activities    PRECAUTIONS: Other: cervical cancer with radiation  RED FLAGS: None   WEIGHT BEARING RESTRICTIONS:  No  FALLS:  Has patient fallen in last 6 months? Yes. Number of falls 1x/weekly due to weakness and decreased balance from having the radiation, trouble with low light - reports she does need to furniture walk to help with balance  LIVING ENVIRONMENT: Lives with: lives with their family  OCCUPATION: sits with laptop on her lap for 12 hours per day; unable to sit with legs down due to them falling asleep  PLOF: Independent  PATIENT GOALS: to strengthening enough to walk across grass, wants to have better balance, stronger legs                                        PERTINENT HISTORY:  ADHD; Cervical Cancer 2014; 9 weeks of radiation; Radical hysterectomy;    OBJECTIVE:  Note: Objective measures were completed at Evaluation unless otherwise noted.  DIAGNOSTIC FINDINGS:   PATIENT SURVEYS:  Lower Extremity Functional Score: 5 / 80 = 6.3 %  COGNITION: Overall cognitive status: Within functional limits for tasks assessed     SENSATION: Poor sensation at Rt LE - light touch felt along posterior gastroc only no awareness without visualization of being touched at medial, lateral, anterior lower leg, reports numbness in bil feet Rt>Lt  EDEMA:  Rt ankle sometimes swells per pt  MUSCLE LENGTH: Bil hamstrings and adductors limited by 25%;  POSTURE: rounded shoulders and forward head  LOWER EXTREMITY ROM:  weakness and poor coordination/proprioception   LOWER EXTREMITY MMT:  02/04/24 hip flexion was R 3/5, L 4/5  MMT Right Eval */5 Left Eval */5 Right 5/16 Left 5/16 RIGHT 6/6 LEFT  6/6  Hip flexion 2 3 4+ 5 3   Hip extension 2 3 4+ 4 4+ 4  Hip abduction 3 3+ 5- 5 5   Hip adduction 1+ 2+ 2+ 2+ 2+ 2+  Hip internal rotation        Hip external rotation        Knee flexion 2 3 3- 4 3+ 5  Knee extension 2+ 3 3- 5 3+   Ankle dorsiflexion 0 4 0 4+ long sitting 2 4  4+ long sitting 2    Ankle plantarflexion 3 4      Ankle inversion 1 4 3+ tested in long sitting 4+ tested  in long sitting 3+ in long sitting 5 in long sitting  Ankle eversion 2 4 3+ tested in long sitting 4- tested in long sitting 2+ 4- tested in long sitting   (  Blank rows = not tested)   FUNCTIONAL TESTS:  Five times Sit to Stand Test (FTSS) Method: Use a straight back chair with a solid seat that is 16-18" high. Ask participant to sit on the chair with arms folded across their chest.   Instructions: "Stand up and sit down as quickly as possible 5 times, keeping your arms folded across your chest."   Measurement: Stop timing when the participant stands the 5th time.  TIME: ______ (in seconds)  Times > 13.6 seconds is associated with increased disability and morbidity (Guralnik, 2000) Times > 15 seconds is predictive of recurrent falls in healthy individuals aged 74 and older (Buatois, et al., 2008) Normal performance values in community dwelling individuals aged 65 and older (Bohannon, 2006): 50-59 years: 8 seconds 60-69 years: 11.4 seconds 70-79 years: 12.6 seconds 80-89 years: 14.8 seconds  MCID: >= 2.3 seconds for Vestibular Disorders (Meretta, 2006)  5 times sit to stand: 22s with hands - very unstable with poor hip, posture, core control  Timed up and go (TUG): 28s with SBQC - min A  GAIT: Distance walked: 200' Assistive device utilized: Counselling psychologist Level of assistance: CGA Comments: Rt LE demonstrating ataxic pattern, Lt trunk lean intermittently  to right self, poor step height bil, decreased stride length, limited glute activation                                                                                                                                 TREATMENT DATE:   03/05/24:Pt arrives for aquatic physical therapy. Treatment took place in 3.5-5.5 feet of water . Water  temperature was. Pt entered the pool via stairs with heavy use of railings, step to step: PTA SBA. Pt requires buoyancy of water  for support and to offload joints with strengthening exercises.   Seated water  bench with 75% submersion Pt performed seated LE AROM exercises 20x in all planes, concurrent discussion of current status. 75% depth water  walking with PTA CGA at pelvis. 8 lengths in each direction, pt requires small noodle for balance. Standing slow marching 10x Bil and hip abduction 10x holding onto wall. Standing single leg stance 3x 10 sec holding large noodle Bil.  Seated decompression with large noodle behind patient to rest low back 2 min 2x bouts. Prone float with large noodle for bil quad stretch, VC to flex knees. Supine float holding large noodle across chest then hip add/abd 10x, VC to use her abdominals more for control.   03/03/2024 Nu step seat 5,UE 10, Lev 5 x 7 min STG and MMT assessed Ankle strengthening: hooklying over bolster B Inv  squeezing ball between feet 5 sec hold -10x Bridge with blue loop around thighs and ball between knees to help with alignment - (5-10 sec hold) x 10 Adductor ball squeeze with beach ball in supine x10 hooklying blue loop fig 8 above knees: single side clam keeping other side still 10x  Seated pink power cord  pull down and hold isometrically while marching to fatigue (band on thighs to help with alignment)    02/27/24:Pt arrives for aquatic physical therapy. Treatment took place in 3.5-5.5 feet of water . Water  temperature was. Pt entered the pool via stairs with heavy use of railings, step to step: PTA SBA. Pt requires buoyancy of water  for support and to offload joints with strengthening exercises.  Seated water  bench with 75% submersion Pt performed seated LE AROM exercises 20x in all planes, concurrent discussion of current status. 75% depth water  walking with PTA CGA at pelvis. 6 lengths in each direction, pt requires small noodle for balance. Seated decompression with large noodle behind patient. PTA providing manual pelvic stability 2 min 2x bouts.  PATIENT EDUCATION:  Education details: [id number] - uses App Person educated:  Patient Education method: Explanation, Demonstration, Tactile cues, Verbal cues, and Handouts Education comprehension: verbalized understanding, returned demonstration, verbal cues required, tactile cues required, and needs further education  HOME EXERCISE PROGRAM: Access Code: [email or web address] URL: [email or web address] Date: 02/04/2024 Prepared by: Concha Deed  Exercises - Supine Heel Slide with Strap  - 1 x daily - 7 x weekly - 3 sets - 10 reps - Pillow squeezes with kegel  - 1 x daily - 7 x weekly - 3 sets - 10 reps - Sit to Stand with Counter Support  - 1 x daily - 7 x weekly - 1 sets - 10 reps - Seated Long Arc Quad  - 1 x daily - 7 x weekly - 1-3 sets - 10 reps - 5 sec hold - Seated Isometric Hip Abduction with Resistance  - 1 x daily - 7 x weekly - 1-3 sets - 10 reps - Heel Raises with Counter Support  - 1 x daily - 7 x weekly - 1-3 sets - 10 reps - Standing Terminal Knee Extension at Wall with Ball  - 2 x daily - 7 x weekly - 2 sets - 10 reps - 5 sec hold - Squat with Chair Touch and Resistance Loop  - 1 x daily - 3 x weekly - 2 sets - 10 reps - Standing Knee Flexion with Counter Support  - 1 x daily - 3 x weekly - 2 sets - 10 reps - Supine Bridge with Resistance Band  - 1 x daily - 7 x weekly - 1-2 sets - 10 reps - 5 sec hold  ASSESSMENT:  CLINICAL IMPRESSION: Very difficult to heel strike on the right foot. Pt working hard to get some consistent heel strikes when walking in the pool today.Best was around 3 steps with consistent heel strike. Mindful on overdoing in the pool as pt gets pretty sore the next day after PT. Added some standing exercises today to challenge her LE stability/balance. Requires holding something for safe balance.  Eval: Patient is a 58 y.o. female  who was seen today for physical therapy evaluation and treatment for poor gait mechanics, impaired posture, impaired balance, decreased hip and core strength, increased fall risk. Pt has complex history of   Radiation-induced lumbosacral plexopathy and Polyradiculoneuropathy status post cervical cancer. Pt has completed pelvic floor PT recently and had positive outcomes with this and resolution of symptoms. Pt very motivated to participate in PT for bil LE strengthening and decreased fall risk. Pt demonstrated impairments with objective measurements of TUG and 5xSTS indicative of increased fall risk and balance deficits. Pt reports she is unable to hold and carry grandchildren, to walk on uneven surfaces, needs assistance with dressing, walking, transfers sometimes  and wants to be more I. Pt would benefit from additional PT to further address deficits.    OBJECTIVE IMPAIRMENTS: Abnormal gait, decreased activity tolerance, decreased coordination, decreased endurance, decreased mobility, difficulty walking, decreased strength, increased fascial restrictions, increased muscle spasms, impaired flexibility, impaired sensation, improper body mechanics, postural dysfunction, and pain.   ACTIVITY LIMITATIONS: carrying, lifting, bending, sitting, standing, squatting, stairs, transfers, bed mobility, locomotion level, and caring for others  PARTICIPATION LIMITATIONS: meal prep, cleaning, laundry, interpersonal relationship, driving, shopping, community activity, occupation, and yard work  PERSONAL FACTORS: Fitness, Past/current experiences, Time since onset of injury/illness/exacerbation, and 1 comorbidity: medical history are also affecting patient's functional outcome.   REHAB POTENTIAL: Good  CLINICAL DECISION MAKING: Evolving/moderate complexity  EVALUATION COMPLEXITY: Moderate   GOALS: Goals reviewed with patient? Yes  SHORT TERM GOALS: Target date: 02/13/24 Pt to be I with HEP for carry over and continuing recommendations for improved outcomes.   Baseline: Goal status: MET  2.  Pt to demonstrate at least 3/5 Rt LE strength grossly and 4/5 Lt for improved pelvic stability and functional squats without  falling.  Baseline:  Goal status: IPARTIALLY MET 03/03/24  3.  Pt to demonstrate ability to complete static standing without AD for at least 5 mins at midline to complete gentle kitchen tasks/household tasks without fear of falling.  Baseline:  Goal status: MET  4.  Pt to demonstrate I with dynamic sitting balance to better be able to reach floor level for retrieving fallen objects and putting on socks without falling Baseline:  Goal status: MET 03/03/24  LONG TERM GOALS: Target date: 07/17/24  Pt to be I with advanced HEP for carry over and continuing recommendations for improved outcomes.   Baseline:  Goal status: INITIAL  2.  Pt to report improved LEFS score to at least 30/80 for improved I with functional tasks at home such as dressing, walking from room to room.  Baseline:  Goal status: INITIAL  3.  Pt to demonstrate improved dynamic standing balance with or without AD at no more than CGA to better be able to play with grandchildren without falling.  Baseline:  Goal status: INITIAL  4.  Pt to demonstrate improved gait mechanics with LRAD for 150' with minimal compensate strategies to I with pt safety with ambulating into a store.  Baseline:  Goal status: INITIAL  5.  Pt to demonstrate ability to perform x5 Sit to stand with at least 10# to more safely assist in caring for grandchildren without risk of falling.  Baseline:  Goal status: INITIAL    PLAN:  PT FREQUENCY: 2x/week  PT DURATION: 12 weeks  PLANNED INTERVENTIONS:    97110-Therapeutic exercises, 97530- Therapeutic activity, [id number]- Neuromuscular re-education, 97535- Self Care, [id number]- Manual therapy, [phone]- Gait training, [phone]- Canalith repositioning, [id number]- Aquatic Therapy, [phone]- Splinting, [id number]- Electrical stimulation (manual), [id number]- Vasopneumatic device, [id number]- Ultrasound, [id number]- Traction (mechanical), [id number]- Ionotophoresis 4mg /ml Dexamethasone , Patient/Family education, Balance training, Stair training,  Taping, Dry Needling, Joint mobilization, Joint manipulation, Spinal manipulation, Spinal mobilization, Scar mobilization, DME instructions, Wheelchair mobility training, Cryotherapy, Moist heat, and Biofeedback  NEXT VISIT: aquatic PT, core and LE strengthening and neuromuscular re-ed  Bethanne Brooks, PTA 03/05/24 9:36 AM

## 2024-03-09 NOTE — Therapy (Incomplete)
 OUTPATIENT PHYSICAL THERAPY FEMALE PELVIC AND ORTHO TREATMENT   Patient Name: Anna Ramsey MRN: [id number] DOB:Jul 10, 1966, 58 y.o., female Today's Date: 03/09/2024  END OF SESSION:             Past Medical History:  Diagnosis Date   ADHD (attention deficit hyperactivity disorder) 2024   Dysuria    Erythematous bladder mucosa    Heart murmur    asymptomatic per pt --  no echo   Hematuria    History of cervical cancer    12/ 2014  Stage IB2--  s/p  TAH w/ BSO and pelvic lymphadectomy/  and radiation therapy   History of radiation therapy 10/20/13-11/28/13   pelvis 50.4 gray   Hypertension    Lesion of bladder    Mild intermittent asthma    Neuromuscular disorder (HCC)    Seasonal allergies    Thyroid disease    Past Surgical History:  Procedure Laterality Date   CYSTOSCOPY WITH BIOPSY N/A 04/29/2015   Procedure: CYSTOSCOPY WITH BIOPSY WITH FULGERATION;  Surgeon: Marco Severs, MD;  Location: [facility];  Service: Urology;  Laterality: N/A;  1 HR [phone] [id number]   FOOT SURGERY Left 2010   RADICAL ABDOMINAL HYSTERECTOMY  09-05-2013   w/ BILATERAL SALPINGOOPHORECTOMY AND PELVIC LYMPHADECTOMY   Patient Active Problem List   Diagnosis Date Noted   SUI (stress urinary incontinence, female) 11/01/2023   History of urinary retention 11/01/2023   Neurogenic bladder 10/18/2023   Hematuria 10/18/2023   Acute urinary retention 10/18/2023   Preventive measure 12/20/2022   Stress and adjustment reaction 01/13/2021   Inattention 10/28/2019   Hypothyroid 11/13/2016   Neuropathy involving both lower extremities 11/13/2016   Cystitis, radiation 05/22/2014   Cervical cancer (HCC) 09/01/2013   Adenocarcinoma of cervix, 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   Asthma, mild intermittent 07/07/2009   INSOMNIA 05/28/2008   Generalized anxiety disorder 05/07/2008   HYPERTENSION, BENIGN 05/31/2007    PCP: Mickel Alanis, MD  REFERRING  PROVIDER: Merriam Abbey, DO   REFERRING DIAG:  [phone] (ICD-10-CM) - Bilateral leg weakness G62.89 (ICD-10-CM) - 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  Rationale for Evaluation and Treatment: Rehabilitation  ONSET DATE: 2015  SUBJECTIVE:                                                                                                                                                                                           SUBJECTIVE STATEMENT: ***    Eval: Right leg worse than left but both painful. Right has instances of  knee just giving out, has neuropathy - like weakness, pain since radiation in 2015. The nerves have been affected and greatly limiting her activity. Does have symptoms of numbness, sensory deficits, weakness, ataxic gait, and painful but all vary based on the day.     Fluid intake: Yes: water    PAIN:  Are you having pain? A lot of RTLE numbness, hips feel 'off. NPRS scale:0 Pain location: bil legs Right leg  Pain type: aching Pain description: constant   Aggravating factors: walking, end of day, rainy days Relieving factors: try not to walk, decreasing activities    PRECAUTIONS: Other: cervical cancer with radiation  RED FLAGS: None   WEIGHT BEARING RESTRICTIONS: No  FALLS:  Has patient fallen in last 6 months? Yes. Number of falls 1x/weekly due to weakness and decreased balance from having the radiation, trouble with low light - reports she does need to furniture walk to help with balance  LIVING ENVIRONMENT: Lives with: lives with their family  OCCUPATION: sits with laptop on her lap for 12 hours per day; unable to sit with legs down due to them falling asleep  PLOF: Independent  PATIENT GOALS: to strengthening enough to walk across grass, wants to have better balance, stronger legs                                         PERTINENT HISTORY:  ADHD; Cervical Cancer 2014; 9 weeks of radiation; Radical hysterectomy;    OBJECTIVE:  Note: Objective measures were completed at Evaluation unless otherwise noted.  DIAGNOSTIC FINDINGS:   PATIENT SURVEYS:  Lower Extremity Functional Score: 5 / 80 = 6.3 %  COGNITION: Overall cognitive status: Within functional limits for tasks assessed     SENSATION: Poor sensation at Rt LE - light touch felt along posterior gastroc only no awareness without visualization of being touched at medial, lateral, anterior lower leg, reports numbness in bil feet Rt>Lt  EDEMA:  Rt ankle sometimes swells per pt  MUSCLE LENGTH: Bil hamstrings and adductors limited by 25%;  POSTURE: rounded shoulders and forward head  LOWER EXTREMITY ROM:  weakness and poor coordination/proprioception   LOWER EXTREMITY MMT:  02/04/24 hip flexion was R 3/5, L 4/5  MMT Right Eval */5 Left Eval */5 Right 5/16 Left 5/16 RIGHT 6/6 LEFT  6/6  Hip flexion 2 3 4+ 5 3   Hip extension 2 3 4+ 4 4+ 4  Hip abduction 3 3+ 5- 5 5   Hip adduction 1+ 2+ 2+ 2+ 2+ 2+  Hip internal rotation        Hip external rotation        Knee flexion 2 3 3- 4 3+ 5  Knee extension 2+ 3 3- 5 3+   Ankle dorsiflexion 0 4 0 4+ long sitting 2 4  4+ long sitting 2    Ankle plantarflexion 3 4      Ankle inversion 1 4 3+ tested in long sitting 4+ tested in long sitting 3+ in long sitting 5 in long sitting  Ankle eversion 2 4 3+ tested in long sitting 4- tested in long sitting 2+ 4- tested in long sitting   (Blank rows = not tested)   FUNCTIONAL TESTS:  Five times Sit to Stand Test (FTSS) Method: Use a straight back chair with a solid seat that is 16-18" high. Ask participant to sit on the chair with arms  folded across their chest.   Instructions: "Stand up and sit down as quickly as possible 5 times, keeping your arms folded across your chest."   Measurement: Stop timing when the participant stands the 5th  time.  TIME: ______ (in seconds)  Times > 13.6 seconds is associated with increased disability and morbidity (Guralnik, 2000) Times > 15 seconds is predictive of recurrent falls in healthy individuals aged 8 and older (Buatois, et al., 2008) Normal performance values in community dwelling individuals aged 31 and older (Bohannon, 2006): 50-59 years: 8 seconds 60-69 years: 11.4 seconds 70-79 years: 12.6 seconds 80-89 years: 14.8 seconds  MCID: >= 2.3 seconds for Vestibular Disorders (Meretta, 2006)  5 times sit to stand: 22s with hands - very unstable with poor hip, posture, core control  Timed up and go (TUG): 28s with SBQC - min A  GAIT: Distance walked: 200' Assistive device utilized: Quad cane small base Level of assistance: CGA Comments: Rt LE demonstrating ataxic pattern, Lt trunk lean intermittently  to right self, poor step height bil, decreased stride length, limited glute activation                                                                                                                                 TREATMENT DATE:  03/10/2024 Nu step seat 5,UE 10, Lev 5 x 7 min Ankle strengthening: hooklying over bolster B Inv  squeezing ball between feet 5 sec hold -10x Bridge with blue loop around thighs and ball between knees to help with alignment - (5-10 sec hold) x 10 Adductor ball squeeze with beach ball in supine x10 hooklying blue loop fig 8 above knees: single side clam keeping other side still 10x  Seated pink power cord pull down and hold isometrically while marching to fatigue (band on thighs to help with alignment) Standing at barre: hip ADDuction with foot on slider - also did diagonals, forward and backward x5 each  Standing at barre: foot on slider - 1/2 circles (arc) x 5 ea each direction  03/05/24:Pt arrives for aquatic physical therapy. Treatment took place in 3.5-5.5 feet of water . Water  temperature was. Pt entered the pool via stairs with heavy use of  railings, step to step: PTA SBA. Pt requires buoyancy of water  for support and to offload joints with strengthening exercises.  Seated water  bench with 75% submersion Pt performed seated LE AROM exercises 20x in all planes, concurrent discussion of current status. 75% depth water  walking with PTA CGA at pelvis. 8 lengths in each direction, pt requires small noodle for balance. Standing slow marching 10x Bil and hip abduction 10x holding onto wall. Standing single leg stance 3x 10 sec holding large noodle Bil.  Seated decompression with large noodle behind patient to rest low back 2 min 2x bouts. Prone float with large noodle for bil quad stretch, VC to flex knees. Supine float holding large noodle across chest then hip add/abd 10x, VC to use  her abdominals more for control.   03/03/2024 Nu step seat 5,UE 10, Lev 5 x 7 min STG and MMT assessed Ankle strengthening: hooklying over bolster B Inv  squeezing ball between feet 5 sec hold -10x Bridge with blue loop around thighs and ball between knees to help with alignment - (5-10 sec hold) x 10 Adductor ball squeeze with beach ball in supine x10 hooklying blue loop fig 8 above knees: single side clam keeping other side still 10x  Seated pink power cord pull down and hold isometrically while marching to fatigue (band on thighs to help with alignment)    02/27/24:Pt arrives for aquatic physical therapy. Treatment took place in 3.5-5.5 feet of water . Water  temperature was. Pt entered the pool via stairs with heavy use of railings, step to step: PTA SBA. Pt requires buoyancy of water  for support and to offload joints with strengthening exercises.  Seated water  bench with 75% submersion Pt performed seated LE AROM exercises 20x in all planes, concurrent discussion of current status. 75% depth water  walking with PTA CGA at pelvis. 6 lengths in each direction, pt requires small noodle for balance. Seated decompression with large noodle behind patient. PTA providing  manual pelvic stability 2 min 2x bouts.  PATIENT EDUCATION:  Education details: [id number] - uses App Person educated: Patient Education method: Explanation, Demonstration, Tactile cues, Verbal cues, and Handouts Education comprehension: verbalized understanding, returned demonstration, verbal cues required, tactile cues required, and needs further education  HOME EXERCISE PROGRAM: Access Code: [email or web address] URL: [email or web address] Date: 02/04/2024 Prepared by: Concha Deed  Exercises - Supine Heel Slide with Strap  - 1 x daily - 7 x weekly - 3 sets - 10 reps - Pillow squeezes with kegel  - 1 x daily - 7 x weekly - 3 sets - 10 reps - Sit to Stand with Counter Support  - 1 x daily - 7 x weekly - 1 sets - 10 reps - Seated Long Arc Quad  - 1 x daily - 7 x weekly - 1-3 sets - 10 reps - 5 sec hold - Seated Isometric Hip Abduction with Resistance  - 1 x daily - 7 x weekly - 1-3 sets - 10 reps - Heel Raises with Counter Support  - 1 x daily - 7 x weekly - 1-3 sets - 10 reps - Standing Terminal Knee Extension at Wall with Ball  - 2 x daily - 7 x weekly - 2 sets - 10 reps - 5 sec hold - Squat with Chair Touch and Resistance Loop  - 1 x daily - 3 x weekly - 2 sets - 10 reps - Standing Knee Flexion with Counter Support  - 1 x daily - 3 x weekly - 2 sets - 10 reps - Supine Bridge with Resistance Band  - 1 x daily - 7 x weekly - 1-2 sets - 10 reps - 5 sec hold  ASSESSMENT:  CLINICAL IMPRESSION: ***  Eval: Patient is a 58 y.o. female  who was seen today for physical therapy evaluation and treatment for poor gait mechanics, impaired posture, impaired balance, decreased hip and core strength, increased fall risk. Pt has complex history of  Radiation-induced lumbosacral plexopathy and Polyradiculoneuropathy status post cervical cancer. Pt has completed pelvic floor PT recently and had positive outcomes with this and resolution of symptoms. Pt very motivated to participate in PT for bil LE  strengthening and decreased fall risk. Pt demonstrated impairments with objective measurements of TUG and 5xSTS indicative of increased fall risk and  balance deficits. Pt reports she is unable to hold and carry grandchildren, to walk on uneven surfaces, needs assistance with dressing, walking, transfers sometimes and wants to be more I. Pt would benefit from additional PT to further address deficits.    OBJECTIVE IMPAIRMENTS: Abnormal gait, decreased activity tolerance, decreased coordination, decreased endurance, decreased mobility, difficulty walking, decreased strength, increased fascial restrictions, increased muscle spasms, impaired flexibility, impaired sensation, improper body mechanics, postural dysfunction, and pain.   ACTIVITY LIMITATIONS: carrying, lifting, bending, sitting, standing, squatting, stairs, transfers, bed mobility, locomotion level, and caring for others  PARTICIPATION LIMITATIONS: meal prep, cleaning, laundry, interpersonal relationship, driving, shopping, community activity, occupation, and yard work  PERSONAL FACTORS: Fitness, Past/current experiences, Time since onset of injury/illness/exacerbation, and 1 comorbidity: medical history are also affecting patient's functional outcome.   REHAB POTENTIAL: Good  CLINICAL DECISION MAKING: Evolving/moderate complexity  EVALUATION COMPLEXITY: Moderate   GOALS: Goals reviewed with patient? Yes  SHORT TERM GOALS: Target date: 02/13/24 Pt to be I with HEP for carry over and continuing recommendations for improved outcomes.   Baseline: Goal status: MET  2.  Pt to demonstrate at least 3/5 Rt LE strength grossly and 4/5 Lt for improved pelvic stability and functional squats without falling.  Baseline:  Goal status: IPARTIALLY MET 03/03/24  3.  Pt to demonstrate ability to complete static standing without AD for at least 5 mins at midline to complete gentle kitchen tasks/household tasks without fear of falling.  Baseline:   Goal status: MET  4.  Pt to demonstrate I with dynamic sitting balance to better be able to reach floor level for retrieving fallen objects and putting on socks without falling Baseline:  Goal status: MET 03/03/24  LONG TERM GOALS: Target date: 07/17/24  Pt to be I with advanced HEP for carry over and continuing recommendations for improved outcomes.   Baseline:  Goal status: INITIAL  2.  Pt to report improved LEFS score to at least 30/80 for improved I with functional tasks at home such as dressing, walking from room to room.  Baseline:  Goal status: INITIAL  3.  Pt to demonstrate improved dynamic standing balance with or without AD at no more than CGA to better be able to play with grandchildren without falling.  Baseline:  Goal status: INITIAL  4.  Pt to demonstrate improved gait mechanics with LRAD for 150' with minimal compensate strategies to I with pt safety with ambulating into a store.  Baseline:  Goal status: INITIAL  5.  Pt to demonstrate ability to perform x5 Sit to stand with at least 10# to more safely assist in caring for grandchildren without risk of falling.  Baseline:  Goal status: INITIAL    PLAN:  PT FREQUENCY: 2x/week  PT DURATION: 12 weeks  PLANNED INTERVENTIONS:    97110-Therapeutic exercises, 97530- Therapeutic activity, [id number]- Neuromuscular re-education, 97535- Self Care, [id number]- Manual therapy, [phone]- Gait training, [phone]- Canalith repositioning, [id number]- Aquatic Therapy, [phone]- Splinting, [id number]- Electrical stimulation (manual), [id number]- Vasopneumatic device, [id number]- Ultrasound, [id number]- Traction (mechanical), [id number]- Ionotophoresis 4mg /ml Dexamethasone , Patient/Family education, Balance training, Stair training, Taping, Dry Needling, Joint mobilization, Joint manipulation, Spinal manipulation, Spinal mobilization, Scar mobilization, DME instructions, Wheelchair mobility training, Cryotherapy, Moist heat, and Biofeedback  NEXT VISIT: aquatic PT, core and LE  strengthening and neuromuscular re-ed  Jinx Mourning, PT  03/09/24 9:47 PM

## 2024-03-10 ENCOUNTER — Ambulatory Visit: Admitting: Physical Therapy

## 2024-03-10 ENCOUNTER — Telehealth: Payer: Self-pay | Admitting: Physical Therapy

## 2024-03-10 NOTE — Telephone Encounter (Signed)
 Phoned patient about missed appt and reminding her of next appointment 6/23.

## 2024-03-12 ENCOUNTER — Ambulatory Visit: Admitting: Physical Therapy

## 2024-03-14 ENCOUNTER — Other Ambulatory Visit: Payer: Self-pay | Admitting: Obstetrics

## 2024-03-14 ENCOUNTER — Encounter: Admitting: Physical Therapy

## 2024-03-14 DIAGNOSIS — N319 Neuromuscular dysfunction of bladder, unspecified: Secondary | ICD-10-CM

## 2024-03-14 DIAGNOSIS — Z87898 Personal history of other specified conditions: Secondary | ICD-10-CM

## 2024-03-14 NOTE — Telephone Encounter (Signed)
**Note De-identified  [place] Obfuscation** Please advise 

## 2024-03-14 NOTE — Telephone Encounter (Signed)
 Attempted to contact. [facility].

## 2024-03-16 NOTE — Therapy (Unsigned)
 OUTPATIENT PHYSICAL THERAPY FEMALE PELVIC AND ORTHO TREATMENT   Patient Name: Anna Ramsey MRN: [id number] DOB:1966-06-01, 58 y.o., female Today's Date: 03/17/2024  END OF SESSION:  PT End of Session - 03/17/24 0806     Visit Number 15    Date for PT Re-Evaluation 07/17/24    Authorization Type [employer]    PT Start Time 0804    PT Stop Time 0845    PT Time Calculation (min) 41 min    Activity Tolerance Patient tolerated treatment well    Behavior During Therapy [facility] for tasks assessed/performed                    Past Medical History:  Diagnosis Date   ADHD (attention deficit hyperactivity disorder) 2024   Dysuria    Erythematous bladder mucosa    Heart murmur    asymptomatic per pt --  no echo   Hematuria    History of cervical cancer    12/ 2014  Stage IB2--  s/p  TAH w/ BSO and pelvic lymphadectomy/  and radiation therapy   History of radiation therapy 10/20/13-11/28/13   pelvis 50.4 gray   Hypertension    Lesion of bladder    Mild intermittent asthma    Neuromuscular disorder (HCC)    Seasonal allergies    Thyroid disease    Past Surgical History:  Procedure Laterality Date   CYSTOSCOPY WITH BIOPSY N/A 04/29/2015   Procedure: CYSTOSCOPY WITH BIOPSY WITH FULGERATION;  Surgeon: Belvie LITTIE Clara, MD;  Location: [facility];  Service: Urology;  Laterality: N/A;  1 HR [id number]   FOOT SURGERY Left 2010   RADICAL ABDOMINAL HYSTERECTOMY  09-05-2013   w/ BILATERAL SALPINGOOPHORECTOMY AND PELVIC LYMPHADECTOMY   Patient Active Problem List   Diagnosis Date Noted   SUI (stress urinary incontinence, female) 11/01/2023   History of urinary retention 11/01/2023   Neurogenic bladder 10/18/2023   Hematuria 10/18/2023   Acute urinary retention 10/18/2023   Preventive measure 12/20/2022   Stress and adjustment reaction 01/13/2021   Inattention 10/28/2019   Hypothyroid 11/13/2016   Neuropathy involving both lower  extremities 11/13/2016   Cystitis, radiation 05/22/2014   Cervical cancer (HCC) 09/01/2013   Adenocarcinoma of cervix, 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   Asthma, mild intermittent 07/07/2009   INSOMNIA 05/28/2008   Generalized anxiety disorder 05/07/2008   HYPERTENSION, BENIGN 05/31/2007    PCP: Geraline Dorothyann JONETTA, MD  REFERRING PROVIDER: Skeet Juliene SAUNDERS, DO   REFERRING DIAG:  [phone] (ICD-10-CM) - Bilateral leg weakness G62.89 (ICD-10-CM) - 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 Went to the pool with grandkids yesterday.     Eval: Right leg worse than left but both painful. Right has instances of knee just giving out, has neuropathy - like weakness, pain since radiation in 2015. The nerves have been affected and greatly limiting her activity. Does have symptoms of numbness, sensory deficits, weakness, ataxic gait, and painful but all vary based on the day.     Fluid intake: Yes: water    PAIN:  Are you having pain? A lot of RTLE numbness, hips feel 'off. NPRS scale:0 Pain location: bil legs Right leg  Pain type: aching Pain description: constant   Aggravating factors: walking, end of day, rainy days Relieving factors: try not to walk, decreasing activities    PRECAUTIONS: Other: cervical cancer with radiation  RED FLAGS: None   WEIGHT BEARING RESTRICTIONS: No  FALLS:  Has patient fallen in last 6  months? Yes. Number of falls 1x/weekly due to weakness and decreased balance from having the radiation, trouble with low light - reports she does need to furniture walk to help with balance  LIVING ENVIRONMENT: Lives with: lives with their family  OCCUPATION: sits with laptop on her lap for 12 hours per day; unable to sit with legs down due to them falling asleep  PLOF: Independent  PATIENT GOALS: to strengthening enough to walk across grass, wants to have better balance, stronger legs                                        PERTINENT HISTORY:  ADHD; Cervical Cancer 2014; 9 weeks of radiation; Radical hysterectomy;    OBJECTIVE:  Note: Objective measures were completed at Evaluation unless otherwise noted.  DIAGNOSTIC FINDINGS:   PATIENT SURVEYS:  Lower Extremity Functional Score: 5 / 80 = 6.3 %  COGNITION: Overall cognitive status: Within functional limits for tasks assessed     SENSATION: Poor sensation at Rt LE - light touch felt along posterior gastroc only no awareness without visualization of being touched at medial, lateral, anterior lower leg, reports numbness in bil feet Rt>Lt  EDEMA:  Rt ankle sometimes swells per pt  MUSCLE LENGTH: Bil hamstrings and adductors limited by 25%;  POSTURE: rounded shoulders and forward head  LOWER EXTREMITY ROM:  weakness and poor coordination/proprioception   LOWER EXTREMITY MMT:  02/04/24 hip flexion was R 3/5, L 4/5  MMT Right Eval */5 Left Eval */5 Right 5/16 Left 5/16 RIGHT 6/6 LEFT  6/6  Hip flexion 2 3 4+ 5 3   Hip extension 2 3 4+ 4 4+ 4  Hip abduction 3 3+ 5- 5 5   Hip adduction 1+ 2+ 2+ 2+ 2+ 2+  Hip internal rotation        Hip external rotation        Knee flexion 2 3 3- 4 3+ 5  Knee extension 2+ 3 3- 5 3+   Ankle dorsiflexion 0 4 0 4+ long sitting 2 4  4+ long sitting 2    Ankle plantarflexion 3 4      Ankle inversion 1 4 3+ tested in long sitting 4+ tested in long sitting 3+ in long sitting 5 in long  sitting  Ankle eversion 2 4 3+ tested in long sitting 4- tested in long sitting 2+ 4-  tested in long sitting   (Blank rows = not tested)   FUNCTIONAL TESTS:  Five times Sit to Stand Test (FTSS) Method: Use a straight back chair with a solid seat that is 16-18" high. Ask participant to sit on the chair with arms folded across their chest.   Instructions: "Stand up and sit down as quickly as possible 5 times, keeping your arms folded across your chest."   Measurement: Stop timing when the participant stands the 5th time.  TIME: ______ (in seconds)  Times > 13.6 seconds is associated with increased disability and morbidity (Guralnik, 2000) Times > 15 seconds is predictive of recurrent falls in healthy individuals aged 26 and older (Buatois, et al., 2008) Normal performance values in community dwelling individuals aged 2 and older (Bohannon, 2006): 50-59 years: 8 seconds 60-69 years: 11.4 seconds 70-79 years: 12.6 seconds 80-89 years: 14.8 seconds  MCID: >= 2.3 seconds for Vestibular Disorders (Meretta, 2006)  5 times sit to stand: 22s with hands - very unstable with poor hip, posture, core control  Timed up and go (TUG): 28s with SBQC - min A  GAIT: Distance walked: 200' Assistive device utilized: Quad cane small base Level of assistance: CGA Comments: Rt LE demonstrating ataxic pattern, Lt trunk lean intermittently  to right self, poor step height bil, decreased stride length, limited glute activation                                                                                                                                 TREATMENT DATE:  03/17/2024 Nu step seat 4,UE 9, Lev 5 x 6 min Standing at barre: R hip ADDuction with foot on slider - also did diagonals, forward and backward x5 each  Resisted walking 5# x 5 bwd and sideways  Cat/cow and happy baby  Clam with blue loop hooklying x 10 hooklying blue loop fig 8 above knees: single side clam keeping other side still  10x   03/05/24:Pt arrives for aquatic physical therapy. Treatment took place in 3.5-5.5 feet of water . Water  temperature was. Pt entered the pool via stairs with heavy use of railings, step to step: PTA SBA. Pt requires buoyancy of water  for support and to offload joints with strengthening exercises.  Seated water  bench with 75% submersion Pt performed seated LE AROM exercises 20x in all planes, concurrent discussion of current status. 75% depth water  walking with PTA CGA at pelvis. 8 lengths in each direction, pt requires small noodle for balance. Standing slow marching 10x Bil and hip abduction 10x holding onto wall. Standing single leg stance 3x 10 sec holding large noodle Bil.  Seated decompression with large noodle behind patient to rest low back 2 min 2x bouts. Prone float with large noodle for bil quad stretch, VC to flex knees. Supine float holding large noodle across chest then hip add/abd 10x, VC to use her abdominals more for control.   03/03/2024 Nu step seat 5,UE 10,  Lev 5 x 7 min STG and MMT assessed Ankle strengthening: hooklying over bolster B Inv  squeezing ball between feet 5 sec hold -10x Bridge with blue loop around thighs and ball between knees to help with alignment - (5-10 sec hold) x 10 Adductor ball squeeze with beach ball in supine x10 hooklying blue loop fig 8 above knees: single side clam keeping other side still 10x  Seated pink power cord pull down and hold isometrically while marching to fatigue (band on thighs to help with alignment)    02/27/24:Pt arrives for aquatic physical therapy. Treatment took place in 3.5-5.5 feet of water . Water  temperature was. Pt entered the pool via stairs with heavy use of railings, step to step: PTA SBA. Pt requires buoyancy of water  for support and to offload joints with strengthening exercises.  Seated water  bench with 75% submersion Pt performed seated LE AROM exercises 20x in all planes, concurrent discussion of current status. 75%  depth water  walking with PTA CGA at pelvis. 6 lengths in each direction, pt requires small noodle for balance. Seated decompression with large noodle behind patient. PTA providing manual pelvic stability 2 min 2x bouts.  PATIENT EDUCATION:  Education details: [email or web address] - uses App Person educated: Patient Education method: Explanation, Demonstration, Tactile cues, Verbal cues, and Handouts Education comprehension: verbalized understanding, returned demonstration, verbal cues required, tactile cues required, and needs further education  HOME EXERCISE PROGRAM: Access Code: [email or web address] URL: [email or web address] Date: 02/04/2024 Prepared by: Mliss  Exercises - Supine Heel Slide with Strap  - 1 x daily - 7 x weekly - 3 sets - 10 reps - Pillow squeezes with kegel  - 1 x daily - 7 x weekly - 3 sets - 10 reps - Sit to Stand with Counter Support  - 1 x daily - 7 x weekly - 1 sets - 10 reps - Seated Long Arc Quad  - 1 x daily - 7 x weekly - 1-3 sets - 10 reps - 5 sec hold - Seated Isometric Hip Abduction with Resistance  - 1 x daily - 7 x weekly - 1-3 sets - 10 reps - Heel Raises with Counter Support  - 1 x daily - 7 x weekly - 1-3 sets - 10 reps - Standing Terminal Knee Extension at Wall with Ball  - 2 x daily - 7 x weekly - 2 sets - 10 reps - 5 sec hold - Squat with Chair Touch and Resistance Loop  - 1 x daily - 3 x weekly - 2 sets - 10 reps - Standing Knee Flexion with Counter Support  - 1 x daily - 3 x weekly - 2 sets - 10 reps - Supine Bridge with Resistance Band  - 1 x daily - 7 x weekly - 1-2 sets - 10 reps - 5 sec hold  ASSESSMENT:  CLINICAL IMPRESSION: Patent continues to experience LOB if she is walking independently and someone touches her or her grandchild tries to grasp her hand. She was very challenged with resisted walking today and will benefit from more glute med strengthening and single leg dynamic activities. She had some increased tightness in her LB after resisted  walking,but was able to work it out with cat/cow and happy baby. She continues to demonstrate potential for improvement and would benefit from continued skilled therapy to address impairments.    Eval: Patient is a 58 y.o. female  who was seen today for physical therapy evaluation and treatment for poor gait mechanics, impaired posture, impaired balance, decreased  hip and core strength, increased fall risk. Pt has complex history of  Radiation-induced lumbosacral plexopathy and Polyradiculoneuropathy status post cervical cancer. Pt has completed pelvic floor PT recently and had positive outcomes with this and resolution of symptoms. Pt very motivated to participate in PT for bil LE strengthening and decreased fall risk. Pt demonstrated impairments with objective measurements of TUG and 5xSTS indicative of increased fall risk and balance deficits. Pt reports she is unable to hold and carry grandchildren, to walk on uneven surfaces, needs assistance with dressing, walking, transfers sometimes and wants to be more I. Pt would benefit from additional PT to further address deficits.    OBJECTIVE IMPAIRMENTS: Abnormal gait, decreased activity tolerance, decreased coordination, decreased endurance, decreased mobility, difficulty walking, decreased strength, increased fascial restrictions, increased muscle spasms, impaired flexibility, impaired sensation, improper body mechanics, postural dysfunction, and pain.   ACTIVITY LIMITATIONS: carrying, lifting, bending, sitting, standing, squatting, stairs, transfers, bed mobility, locomotion level, and caring for others  PARTICIPATION LIMITATIONS: meal prep, cleaning, laundry, interpersonal relationship, driving, shopping, community activity, occupation, and yard work  PERSONAL FACTORS: Fitness, Past/current experiences, Time since onset of injury/illness/exacerbation, and 1 comorbidity: medical history are also affecting patient's functional outcome.   REHAB  POTENTIAL: Good  CLINICAL DECISION MAKING: Evolving/moderate complexity  EVALUATION COMPLEXITY: Moderate   GOALS: Goals reviewed with patient? Yes  SHORT TERM GOALS: Target date: 02/13/24 Pt to be I with HEP for carry over and continuing recommendations for improved outcomes.   Baseline: Goal status: MET  2.  Pt to demonstrate at least 3/5 Rt LE strength grossly and 4/5 Lt for improved pelvic stability and functional squats without falling.  Baseline:  Goal status: IPARTIALLY MET 03/03/24  3.  Pt to demonstrate ability to complete static standing without AD for at least 5 mins at midline to complete gentle kitchen tasks/household tasks without fear of falling.  Baseline:  Goal status: MET  4.  Pt to demonstrate I with dynamic sitting balance to better be able to reach floor level for retrieving fallen objects and putting on socks without falling Baseline:  Goal status: MET 03/03/24  LONG TERM GOALS: Target date: 07/17/24  Pt to be I with advanced HEP for carry over and continuing recommendations for improved outcomes.   Baseline:  Goal status: INITIAL  2.  Pt to report improved LEFS score to at least 30/80 for improved I with functional tasks at home such as dressing, walking from room to room.  Baseline:  Goal status: INITIAL  3.  Pt to demonstrate improved dynamic standing balance with or without AD at no more than CGA to better be able to play with grandchildren without falling.  Baseline:  Goal status: INITIAL  4.  Pt to demonstrate improved gait mechanics with LRAD for 150' with minimal compensate strategies to I with pt safety with ambulating into a store.  Baseline:  Goal status: INITIAL  5.  Pt to demonstrate ability to perform x5 Sit to stand with at least 10# to more safely assist in caring for grandchildren without risk of falling.  Baseline:  Goal status: INITIAL    PLAN:  PT FREQUENCY: 2x/week  PT DURATION: 12 weeks  PLANNED INTERVENTIONS:     97110-Therapeutic exercises, 97530- Therapeutic activity, [id number]- Neuromuscular re-education, 97535- Self Care, [id number]- Manual therapy, [id number]- Gait training, [phone]- Canalith repositioning, [id number]- Aquatic Therapy, [id number]- Splinting, [id number]- Electrical stimulation (manual), [id number]- Vasopneumatic device, [id number]- Ultrasound, [id number]- Traction (mechanical), [id number]- Ionotophoresis 4mg /ml Dexamethasone , Patient/Family education, Balance training, Stair training, Taping, Dry  Needling, Joint mobilization, Joint manipulation, Spinal manipulation, Spinal mobilization, Scar mobilization, DME instructions, Wheelchair mobility training, Cryotherapy, Moist heat, and Biofeedback  NEXT VISIT: aquatic PT, core and LE strengthening and neuromuscular re-ed  LAND: continue with resisted walking, try glut med ball press, single leg step outs  Mliss Cummins, PT  03/17/24 8:48 AM

## 2024-03-17 ENCOUNTER — Other Ambulatory Visit: Payer: Self-pay

## 2024-03-17 ENCOUNTER — Encounter: Payer: Self-pay | Admitting: Physical Therapy

## 2024-03-17 ENCOUNTER — Ambulatory Visit: Admitting: Physical Therapy

## 2024-03-17 DIAGNOSIS — N3289 Other specified disorders of bladder: Secondary | ICD-10-CM

## 2024-03-17 DIAGNOSIS — R293 Abnormal posture: Secondary | ICD-10-CM

## 2024-03-17 DIAGNOSIS — N3281 Overactive bladder: Secondary | ICD-10-CM

## 2024-03-17 DIAGNOSIS — R269 Unspecified abnormalities of gait and mobility: Secondary | ICD-10-CM

## 2024-03-17 DIAGNOSIS — M6281 Muscle weakness (generalized): Secondary | ICD-10-CM

## 2024-03-17 DIAGNOSIS — R350 Frequency of micturition: Secondary | ICD-10-CM

## 2024-03-17 MED ORDER — GEMTESA 75 MG PO TABS: 1.0000 | ORAL_TABLET | Freq: Every day | ORAL | 3 refills | Status: DC

## 2024-03-19 ENCOUNTER — Encounter: Admitting: Physical Therapy

## 2024-03-19 ENCOUNTER — Ambulatory Visit: Admitting: Physical Therapy

## 2024-03-19 ENCOUNTER — Encounter: Payer: Self-pay | Admitting: Physical Therapy

## 2024-03-19 DIAGNOSIS — R269 Unspecified abnormalities of gait and mobility: Secondary | ICD-10-CM

## 2024-03-19 DIAGNOSIS — R293 Abnormal posture: Secondary | ICD-10-CM

## 2024-03-19 DIAGNOSIS — M6281 Muscle weakness (generalized): Secondary | ICD-10-CM | POA: Diagnosis not present

## 2024-03-19 DIAGNOSIS — R279 Unspecified lack of coordination: Secondary | ICD-10-CM

## 2024-03-19 DIAGNOSIS — M62838 Other muscle spasm: Secondary | ICD-10-CM

## 2024-03-19 DIAGNOSIS — R102 Pelvic and perineal pain: Secondary | ICD-10-CM

## 2024-03-19 NOTE — Therapy (Signed)
 OUTPATIENT PHYSICAL THERAPY FEMALE PELVIC AND ORTHO TREATMENT   Patient Name: Anna Ramsey MRN: [id number] DOB:05-19-1966, 58 y.o., female Today's Date: 03/19/2024  END OF SESSION:  PT End of Session - 03/19/24 0907     Visit Number 16    Date for PT Re-Evaluation 07/17/24    Authorization Type [employer]    PT Start Time 0845    PT Stop Time 0930    PT Time Calculation (min) 45 min    Activity Tolerance Patient tolerated treatment well    Behavior During Therapy [facility] for tasks assessed/performed                    Past Medical History:  Diagnosis Date   ADHD (attention deficit hyperactivity disorder) 2024   Dysuria    Erythematous bladder mucosa    Heart murmur    asymptomatic per pt --  no echo   Hematuria    History of cervical cancer    12/ 2014  Stage IB2--  s/p  TAH w/ BSO and pelvic lymphadectomy/  and radiation therapy   History of radiation therapy 10/20/13-11/28/13   pelvis 50.4 gray   Hypertension    Lesion of bladder    Mild intermittent asthma    Neuromuscular disorder (HCC)    Seasonal allergies    Thyroid disease    Past Surgical History:  Procedure Laterality Date   CYSTOSCOPY WITH BIOPSY N/A 04/29/2015   Procedure: CYSTOSCOPY WITH BIOPSY WITH FULGERATION;  Surgeon: Belvie LITTIE Clara, MD;  Location: [facility];  Service: Urology;  Laterality: N/A;  1 HR [phone] [id number]   FOOT SURGERY Left 2010   RADICAL ABDOMINAL HYSTERECTOMY  09-05-2013   w/ BILATERAL SALPINGOOPHORECTOMY AND PELVIC LYMPHADECTOMY   Patient Active Problem List   Diagnosis Date Noted   SUI (stress urinary incontinence, female) 11/01/2023   History of urinary retention 11/01/2023   Neurogenic bladder 10/18/2023   Hematuria 10/18/2023   Acute urinary retention 10/18/2023   Preventive measure 12/20/2022   Stress and adjustment reaction 01/13/2021   Inattention 10/28/2019   Hypothyroid 11/13/2016   Neuropathy involving both lower  extremities 11/13/2016   Cystitis, radiation 05/22/2014   Cervical cancer (HCC) 09/01/2013   Adenocarcinoma of cervix, 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   Asthma, mild intermittent 07/07/2009   INSOMNIA 05/28/2008   Generalized anxiety disorder 05/07/2008   HYPERTENSION, BENIGN 05/31/2007    PCP: Geraline Dorothyann JONETTA, MD  REFERRING PROVIDER: Skeet Juliene SAUNDERS, DO   REFERRING DIAG:  [phone] (ICD-10-CM) - Bilateral leg weakness G62.89 (ICD-10-CM) - 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 Went to the pool with grandkids yesterday.     Eval: Right leg worse than left but both painful. Right has instances of knee just giving out, has neuropathy - like weakness, pain since radiation in 2015. The nerves have been affected and greatly limiting her activity. Does have symptoms of numbness, sensory deficits, weakness, ataxic gait, and painful but all vary based on the day.     Fluid intake: Yes: water    PAIN:  Are you having pain? A lot of RTLE numbness, hips feel 'off. NPRS scale:0 Pain location: bil legs Right leg  Pain type: aching Pain description: constant   Aggravating factors: walking, end of day, rainy days Relieving factors: try not to walk, decreasing activities    PRECAUTIONS: Other: cervical cancer with radiation  RED  FLAGS: None   WEIGHT BEARING RESTRICTIONS: No  FALLS:  Has patient fallen in last 6 months? Yes. Number of falls 1x/weekly due to weakness and decreased balance from having the radiation, trouble with low light - reports she does need to furniture walk to help with balance  LIVING ENVIRONMENT: Lives with: lives with their family  OCCUPATION: sits with laptop on her lap for 12 hours per day; unable to sit with legs down due to them falling asleep  PLOF: Independent  PATIENT GOALS: to strengthening enough to walk across grass, wants to have better balance, stronger legs                                        PERTINENT HISTORY:  ADHD; Cervical Cancer 2014; 9 weeks of radiation; Radical hysterectomy;    OBJECTIVE:  Note: Objective measures were completed at Evaluation unless otherwise noted.  DIAGNOSTIC FINDINGS:   PATIENT SURVEYS:  Lower Extremity Functional Score: 5 / 80 = 6.3 %  COGNITION: Overall cognitive status: Within functional limits for tasks assessed     SENSATION: Poor sensation at Rt LE - light touch felt along posterior gastroc only no awareness without visualization of being touched at medial, lateral, anterior lower leg, reports numbness in bil feet Rt>Lt  EDEMA:  Rt ankle sometimes swells per pt  MUSCLE LENGTH: Bil hamstrings and adductors limited by 25%;  POSTURE: rounded shoulders and forward head  LOWER EXTREMITY ROM:  weakness and poor coordination/proprioception   LOWER EXTREMITY MMT:  02/04/24 hip flexion was R 3/5, L 4/5  MMT Right Eval */5 Left Eval */5 Right 5/16 Left 5/16 RIGHT 6/6 LEFT  6/6  Hip flexion 2 3 4+ 5 3   Hip extension 2 3 4+ 4 4+ 4  Hip abduction 3 3+ 5- 5 5   Hip adduction 1+ 2+ 2+ 2+ 2+ 2+  Hip internal rotation        Hip external rotation        Knee flexion 2 3 3- 4 3+ 5  Knee extension 2+ 3 3- 5 3+   Ankle dorsiflexion 0 4 0 4+ long sitting 2 4  4+ long sitting 2    Ankle plantarflexion 3 4      Ankle  inversion 1 4 3+ tested in long sitting 4+ tested in long sitting 3+ in long sitting 5 in long sitting  Ankle eversion 2 4 3+ tested in long sitting 4- tested in long sitting 2+ 4-  tested in long sitting   (Blank rows = not tested)   FUNCTIONAL TESTS:  Five times Sit to Stand Test (FTSS) Method: Use a straight back chair with a solid seat that is 16-18" high. Ask participant to sit on the chair with arms folded across their chest.   Instructions: "Stand up and sit down as quickly as possible 5 times, keeping your arms folded across your chest."   Measurement: Stop timing when the participant stands the 5th time.  TIME: ______ (in seconds)  Times > 13.6 seconds is associated with increased disability and morbidity (Guralnik, 2000) Times > 15 seconds is predictive of recurrent falls in healthy individuals aged 36 and older (Buatois, et al., 2008) Normal performance values in community dwelling individuals aged 83 and older (Bohannon, 2006): 50-59 years: 8 seconds 60-69 years: 11.4 seconds 70-79 years: 12.6 seconds 80-89 years: 14.8 seconds  MCID: >= 2.3 seconds for Vestibular Disorders (Meretta, 2006)  5 times sit to stand: 22s with hands - very unstable with poor hip, posture, core control  Timed up and go (TUG): 28s with SBQC - min A  GAIT: Distance walked: 200' Assistive device utilized: Counselling psychologist Level of assistance: CGA Comments: Rt LE demonstrating ataxic pattern, Lt trunk lean intermittently  to right self, poor step height bil, decreased stride length, limited glute activation                                                                                                                                 TREATMENT DATE:   03/19/24:Pt arrives for aquatic physical therapy. Treatment took place in 3.5-5.5 feet of water . Water  temperature was. Pt entered the pool via stairs with heavy use of railings, step to step: PTA SBA. Pt requires buoyancy of water  for support  and to offload joints with strengthening exercises.  Seated water  bench with 75% submersion Pt performed seated LE AROM exercises 20x in all planes, concurrent discussion of current status. 75% depth water  walking with PTA CGA at pelvis. 8 lengths in each direction, pt requires small noodle for balance. Standing slow marching 10x Bil, ham curls Bil 10x and hip abduction 10x holding onto wall. Standing single leg stance 3x 10 sec holding large noodle Bil.  Seated decompression with large noodle behind patient to rest low back 2 min 2x bouts. Prone float with large noodle for bil quad stretch, VC to flex knees. Supine float holding large noodle across chest then hip add/abd 10x, VC to use her abdominals more for control.   03/17/2024 Nu step seat 4,UE 9, Lev 5 x 6 min Standing at barre: R hip ADDuction with foot on slider - also did diagonals, forward and backward x5 each  Resisted walking 5# x 5 bwd and sideways  Cat/cow and happy baby  Clam with blue loop hooklying x 10 hooklying blue loop fig 8 above knees: single side clam keeping other side still 10x   03/05/24:Pt  arrives for aquatic physical therapy. Treatment took place in 3.5-5.5 feet of water . Water  temperature was. Pt entered the pool via stairs with heavy use of railings, step to step: PTA SBA. Pt requires buoyancy of water  for support and to offload joints with strengthening exercises.  Seated water  bench with 75% submersion Pt performed seated LE AROM exercises 20x in all planes, concurrent discussion of current status. 75% depth water  walking with PTA CGA at pelvis. 8 lengths in each direction, pt requires small noodle for balance. Standing slow marching 10x Bil and hip abduction 10x holding onto wall. Standing single leg stance 3x 10 sec holding large noodle Bil.  Seated decompression with large noodle behind patient to rest low back 2 min 2x bouts. Prone float with large noodle for bil quad stretch, VC to flex knees. Supine float holding  large noodle across chest then hip add/abd 10x, VC to use her abdominals more for control.   PATIENT EDUCATION:  Education details: [email or web address] - uses App Person educated: Patient Education method: Explanation, Demonstration, Tactile cues, Verbal cues, and Handouts Education comprehension: verbalized understanding, returned demonstration, verbal cues required, tactile cues required, and needs further education  HOME EXERCISE PROGRAM: Access Code: [email or web address] URL: [email or web address] Date: 02/04/2024 Prepared by: Mliss  Exercises - Supine Heel Slide with Strap  - 1 x daily - 7 x weekly - 3 sets - 10 reps - Pillow squeezes with kegel  - 1 x daily - 7 x weekly - 3 sets - 10 reps - Sit to Stand with Counter Support  - 1 x daily - 7 x weekly - 1 sets - 10 reps - Seated Long Arc Quad  - 1 x daily - 7 x weekly - 1-3 sets - 10 reps - 5 sec hold - Seated Isometric Hip Abduction with Resistance  - 1 x daily - 7 x weekly - 1-3 sets - 10 reps - Heel Raises with Counter Support  - 1 x daily - 7 x weekly - 1-3 sets - 10 reps - Standing Terminal Knee Extension at Wall with Ball  - 2 x daily - 7 x weekly - 2 sets - 10 reps - 5 sec hold - Squat with Chair Touch and Resistance Loop  - 1 x daily - 3 x weekly - 2 sets - 10 reps - Standing Knee Flexion with Counter Support  - 1 x daily - 3 x weekly - 2 sets - 10 reps - Supine Bridge with Resistance Band  - 1 x daily - 7 x weekly - 1-2 sets - 10 reps - 5 sec hold  ASSESSMENT:  CLINICAL IMPRESSION: Working hard on her balance and  overall walking stability in the water . Still requires use of large noodle. Shows improvement in her LE control during standing exercises today.  Eval: Patient is a 58 y.o. female  who was seen today for physical therapy evaluation and treatment for poor gait mechanics, impaired posture, impaired balance, decreased hip and core strength, increased fall risk. Pt has complex history of  Radiation-induced lumbosacral  plexopathy and Polyradiculoneuropathy status post cervical cancer. Pt has completed pelvic floor PT recently and had positive outcomes with this and resolution of symptoms. Pt very motivated to participate in PT for bil LE strengthening and decreased fall risk. Pt demonstrated impairments with objective measurements of TUG and 5xSTS indicative of increased fall risk and balance deficits. Pt reports she is unable to hold and carry grandchildren, to walk on uneven surfaces, needs assistance with dressing, walking, transfers  sometimes and wants to be more I. Pt would benefit from additional PT to further address deficits.    OBJECTIVE IMPAIRMENTS: Abnormal gait, decreased activity tolerance, decreased coordination, decreased endurance, decreased mobility, difficulty walking, decreased strength, increased fascial restrictions, increased muscle spasms, impaired flexibility, impaired sensation, improper body mechanics, postural dysfunction, and pain.   ACTIVITY LIMITATIONS: carrying, lifting, bending, sitting, standing, squatting, stairs, transfers, bed mobility, locomotion level, and caring for others  PARTICIPATION LIMITATIONS: meal prep, cleaning, laundry, interpersonal relationship, driving, shopping, community activity, occupation, and yard work  PERSONAL FACTORS: Fitness, Past/current experiences, Time since onset of injury/illness/exacerbation, and 1 comorbidity: medical history are also affecting patient's functional outcome.   REHAB POTENTIAL: Good  CLINICAL DECISION MAKING: Evolving/moderate complexity  EVALUATION COMPLEXITY: Moderate   GOALS: Goals reviewed with patient? Yes  SHORT TERM GOALS: Target date: 02/13/24 Pt to be I with HEP for carry over and continuing recommendations for improved outcomes.   Baseline: Goal status: MET  2.  Pt to demonstrate at least 3/5 Rt LE strength grossly and 4/5 Lt for improved pelvic stability and functional squats without falling.  Baseline:  Goal  status: IPARTIALLY MET 03/03/24  3.  Pt to demonstrate ability to complete static standing without AD for at least 5 mins at midline to complete gentle kitchen tasks/household tasks without fear of falling.  Baseline:  Goal status: MET  4.  Pt to demonstrate I with dynamic sitting balance to better be able to reach floor level for retrieving fallen objects and putting on socks without falling Baseline:  Goal status: MET 03/03/24  LONG TERM GOALS: Target date: 07/17/24  Pt to be I with advanced HEP for carry over and continuing recommendations for improved outcomes.   Baseline:  Goal status: INITIAL  2.  Pt to report improved LEFS score to at least 30/80 for improved I with functional tasks at home such as dressing, walking from room to room.  Baseline:  Goal status: INITIAL  3.  Pt to demonstrate improved dynamic standing balance with or without AD at no more than CGA to better be able to play with grandchildren without falling.  Baseline:  Goal status: INITIAL  4.  Pt to demonstrate improved gait mechanics with LRAD for 150' with minimal compensate strategies to I with pt safety with ambulating into a store.  Baseline:  Goal status: INITIAL  5.  Pt to demonstrate ability to perform x5 Sit to stand with at least 10# to more safely assist in caring for grandchildren without risk of falling.  Baseline:  Goal status: INITIAL    PLAN:  PT FREQUENCY: 2x/week  PT DURATION: 12 weeks  PLANNED INTERVENTIONS:    97110-Therapeutic exercises, 97530- Therapeutic activity, [id number]- Neuromuscular re-education, 97535- Self Care, [id number]- Manual therapy, [phone]- Gait training, [phone]- Canalith repositioning, [id number]- Aquatic Therapy, [phone]- Splinting, [id number]- Electrical stimulation (manual), [id number]- Vasopneumatic device, [id number]- Ultrasound, [id number]- Traction (mechanical), [id number]- Ionotophoresis 4mg /ml Dexamethasone , Patient/Family education, Balance training, Stair training, Taping, Dry Needling, Joint  mobilization, Joint manipulation, Spinal manipulation, Spinal mobilization, Scar mobilization, DME instructions, Wheelchair mobility training, Cryotherapy, Moist heat, and Biofeedback  NEXT VISIT: aquatic PT, core and LE strengthening and neuromuscular re-ed  LAND: continue with resisted walking, try glut med ball press, single leg step outs  Delon Darner, PTA 03/19/24 12:15 PM   03/19/24 12:15 PM

## 2024-03-23 NOTE — Therapy (Signed)
 OUTPATIENT PHYSICAL THERAPY FEMALE PELVIC AND ORTHO TREATMENT   Patient Name: Anna Ramsey MRN: [id number] DOB:1965-10-17, 58 y.o., female Today's Date: 03/24/2024  END OF SESSION:  PT End of Session - 03/24/24 0801     Visit Number 17    Date for PT Re-Evaluation 07/17/24    Authorization Type [employer]    PT Start Time 0758    PT Stop Time 0843    PT Time Calculation (min) 45 min    Activity Tolerance Patient tolerated treatment well                     Past Medical History:  Diagnosis Date   ADHD (attention deficit hyperactivity disorder) 2024   Dysuria    Erythematous bladder mucosa    Heart murmur    asymptomatic per pt --  no echo   Hematuria    History of cervical cancer    12/ 2014  Stage IB2--  s/p  TAH w/ BSO and pelvic lymphadectomy/  and radiation therapy   History of radiation therapy 10/20/13-11/28/13   pelvis 50.4 gray   Hypertension    Lesion of bladder    Mild intermittent asthma    Neuromuscular disorder (HCC)    Seasonal allergies    Thyroid disease    Past Surgical History:  Procedure Laterality Date   CYSTOSCOPY WITH BIOPSY N/A 04/29/2015   Procedure: CYSTOSCOPY WITH BIOPSY WITH FULGERATION;  Surgeon: Belvie LITTIE Clara, MD;  Location: [facility];  Service: Urology;  Laterality: N/A;  1 HR [phone] [id number]   FOOT SURGERY Left 2010   RADICAL ABDOMINAL HYSTERECTOMY  09-05-2013   w/ BILATERAL SALPINGOOPHORECTOMY AND PELVIC LYMPHADECTOMY   Patient Active Problem List   Diagnosis Date Noted   SUI (stress urinary incontinence, female) 11/01/2023   History of urinary retention 11/01/2023   Neurogenic bladder 10/18/2023   Hematuria 10/18/2023   Acute urinary retention 10/18/2023   Preventive measure 12/20/2022   Stress and adjustment reaction 01/13/2021   Inattention 10/28/2019   Hypothyroid 11/13/2016   Neuropathy involving both lower extremities 11/13/2016   Cystitis, radiation 05/22/2014   Cervical  cancer (HCC) 09/01/2013   Adenocarcinoma of cervix, 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   Asthma, mild intermittent 07/07/2009   INSOMNIA 05/28/2008   Generalized anxiety disorder 05/07/2008   HYPERTENSION, BENIGN 05/31/2007    PCP: Geraline Dorothyann JONETTA, MD  REFERRING PROVIDER: Skeet Juliene SAUNDERS, DO   REFERRING DIAG:  [phone] (ICD-10-CM) - Bilateral leg weakness G62.89 (ICD-10-CM) - 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     Eval: Right leg worse than left but both painful. Right has instances of knee just giving out, has neuropathy - like weakness, pain since radiation in 2015. The nerves have been affected and greatly limiting her activity. Does have symptoms of numbness, sensory deficits, weakness, ataxic gait, and painful but all vary based on the day.     Fluid intake: Yes: water    PAIN:  Are you having pain? A lot of RTLE numbness, hips feel 'off. NPRS scale:0 Pain location: bil legs Right leg  Pain type: aching Pain description: constant   Aggravating factors: walking, end of day, rainy days Relieving factors: try not to walk, decreasing activities    PRECAUTIONS: Other: cervical cancer with radiation  RED FLAGS: None   WEIGHT BEARING RESTRICTIONS: No  FALLS:  Has patient fallen in last 6 months? Yes. Number of falls 1x/weekly due to weakness and  decreased balance from having the radiation, trouble with low light - reports she does need to furniture walk to help with balance  LIVING ENVIRONMENT: Lives with: lives with their family  OCCUPATION: sits with laptop on her lap for 12 hours per day; unable to sit with legs down due to them falling asleep  PLOF: Independent  PATIENT GOALS: to strengthening enough to walk across grass, wants to have better balance, stronger legs                                        PERTINENT HISTORY:  ADHD; Cervical Cancer 2014; 9 weeks of radiation; Radical hysterectomy;    OBJECTIVE:  Note: Objective measures were completed at Evaluation unless otherwise noted.  DIAGNOSTIC FINDINGS:   PATIENT SURVEYS:  Lower Extremity Functional Score: 5 / 80 = 6.3 %  COGNITION: Overall cognitive status: Within functional limits for tasks assessed     SENSATION: Poor sensation at Rt LE - light touch felt along posterior gastroc only no awareness without visualization of being touched at medial, lateral, anterior lower leg, reports numbness in bil feet Rt>Lt  EDEMA:  Rt ankle sometimes swells per pt  MUSCLE LENGTH: Bil hamstrings and adductors limited by 25%;  POSTURE: rounded shoulders and forward head  LOWER EXTREMITY ROM:  weakness and poor coordination/proprioception   LOWER EXTREMITY MMT:  02/04/24 hip flexion was R 3/5, L 4/5  MMT Right Eval */5 Left Eval */5 Right 5/16 Left 5/16 RIGHT 6/6 LEFT  6/6  Hip flexion 2 3 4+ 5 3   Hip extension 2 3 4+ 4 4+ 4  Hip abduction 3 3+ 5- 5 5   Hip adduction 1+ 2+ 2+ 2+ 2+ 2+  Hip internal rotation        Hip external rotation        Knee flexion 2 3 3- 4 3+ 5  Knee extension 2+ 3 3- 5 3+   Ankle dorsiflexion 0 4 0 4+ long sitting 2 4  4+ long sitting 2    Ankle plantarflexion 3 4      Ankle inversion 1 4 3+ tested in long sitting 4+ tested in long sitting 3+ in long sitting 5 in long sitting  Ankle eversion 2 4 3+ tested in long sitting 4-  tested in long sitting 2+ 4- tested in long sitting   (Blank rows = not tested)  FUNCTIONAL TESTS:  Five times Sit to Stand Test (FTSS) Method: Use a straight back chair with a solid seat that is 16-18" high. Ask participant to sit on the chair with arms folded across their chest.   Instructions: "Stand up and sit down as quickly as possible 5 times, keeping your arms folded across your chest."   Measurement: Stop timing when the participant stands the 5th time.  TIME: ______ (in seconds)  Times > 13.6 seconds is associated with increased disability and morbidity (Guralnik, 2000) Times > 15 seconds is predictive of recurrent falls in healthy individuals aged 4 and older (Buatois, et al., 2008) Normal performance values in community dwelling individuals aged 108 and older (Bohannon, 2006): 50-59 years: 8 seconds 60-69 years: 11.4 seconds 70-79 years: 12.6 seconds 80-89 years: 14.8 seconds  MCID: >= 2.3 seconds for Vestibular Disorders (Meretta, 2006)  5 times sit to stand: 22s with hands - very unstable with poor hip, posture, core control  Timed up and go (TUG): 28s with SBQC - min A  GAIT: Distance walked: 200' Assistive device utilized: Quad cane small base Level of assistance: CGA Comments: Rt LE demonstrating ataxic pattern, Lt trunk lean intermittently  to right self, poor step height bil, decreased stride length, limited glute activation                                                                                                                                 TREATMENT DATE:   03/24/2024 Nu step seat 4,UE 9, Lev 5 x 6 min Glute med ball press with small beach ball and inside foot on 4 inch step Single leg taps 6 inch challenging so went to 4 inch taps B fwd and lateral focusing on glute med engagement and core stab to avoid lumbar extension Gait: adjusted cane to L UE and correct height used mirror for VC Discussed Foot-up brace to assist with DF  03/19/24:Pt  arrives for aquatic physical therapy. Treatment took place in 3.5-5.5 feet of water . Water  temperature was. Pt entered the pool via stairs with heavy use of railings, step to step: PTA SBA. Pt requires buoyancy of water  for support and to offload joints with strengthening exercises.  Seated water  bench with 75% submersion Pt performed seated LE AROM exercises 20x in all planes, concurrent discussion of current status. 75% depth water  walking with PTA CGA at pelvis. 8 lengths in each direction, pt requires small noodle for balance. Standing slow marching 10x Bil, ham curls Bil 10x and hip abduction 10x holding onto wall. Standing single leg stance 3x 10 sec holding large noodle Bil.  Seated decompression with large noodle behind patient to rest low back 2 min 2x bouts. Prone float with large noodle for bil quad stretch, VC to flex knees. Supine float holding large noodle across chest then hip add/abd 10x, VC to use her abdominals more for control.   03/17/2024 Nu step seat 4,UE 9, Lev 5  x 6 min Standing at barre: R hip ADDuction with foot on slider - also did diagonals, forward and backward x5 each  Resisted walking 5# x 5 bwd and sideways  Cat/cow and happy baby  Clam with blue loop hooklying x 10 hooklying blue loop fig 8 above knees: single side clam keeping other side still 10x   03/05/24:Pt arrives for aquatic physical therapy. Treatment took place in 3.5-5.5 feet of water . Water  temperature was. Pt entered the pool via stairs with heavy use of railings, step to step: PTA SBA. Pt requires buoyancy of water  for support and to offload joints with strengthening exercises.  Seated water  bench with 75% submersion Pt performed seated LE AROM exercises 20x in all planes, concurrent discussion of current status. 75% depth water  walking with PTA CGA at pelvis. 8 lengths in each direction, pt requires small noodle for balance. Standing slow marching 10x Bil and hip abduction 10x holding onto wall. Standing  single leg stance 3x 10 sec holding large noodle Bil.  Seated decompression with large noodle behind patient to rest low back 2 min 2x bouts. Prone float with large noodle for bil quad stretch, VC to flex knees. Supine float holding large noodle across chest then hip add/abd 10x, VC to use her abdominals more for control.   PATIENT EDUCATION:  Education details: [email or web address] - uses App Person educated: Patient Education method: Explanation, Demonstration, Tactile cues, Verbal cues, and Handouts Education comprehension: verbalized understanding, returned demonstration, verbal cues required, tactile cues required, and needs further education  HOME EXERCISE PROGRAM: Access Code: [email or web address] URL: [email or web address] Date: 02/04/2024 Prepared by: Mliss  Exercises - Supine Heel Slide with Strap  - 1 x daily - 7 x weekly - 3 sets - 10 reps - Pillow squeezes with kegel  - 1 x daily - 7 x weekly - 3 sets - 10 reps - Sit to Stand with Counter Support  - 1 x daily - 7 x weekly - 1 sets - 10 reps - Seated Long Arc Quad  - 1 x daily - 7 x weekly - 1-3 sets - 10 reps - 5 sec hold - Seated Isometric Hip Abduction with Resistance  - 1 x daily - 7 x weekly - 1-3 sets - 10 reps - Heel Raises with Counter Support  - 1 x daily - 7 x weekly - 1-3 sets - 10 reps - Standing Terminal Knee Extension at Wall with Ball  - 2 x daily - 7 x weekly - 2 sets - 10 reps - 5 sec hold - Squat with Chair Touch and Resistance Loop  - 1 x daily - 3 x weekly - 2 sets - 10 reps - Standing Knee Flexion with Counter Support  - 1 x daily - 3 x weekly - 2 sets - 10 reps - Supine Bridge with Resistance Band  - 1 x daily - 7 x weekly - 1-2 sets - 10 reps - 5 sec hold  ASSESSMENT:  CLINICAL IMPRESSION: Focused on gait today and gluteus med strengthening in single leg stance position. Improved glute med activation with verbal, tactile and visual cueing. Educated pt on Foot Up brace as she has difficulty clearing curbs/steps at  times due to ant tib weakness.   Eval: Patient is a 58 y.o. female  who was seen today for physical therapy evaluation and treatment for poor gait mechanics, impaired posture, impaired balance, decreased hip and core strength, increased fall risk. Pt has complex history of  Radiation-induced lumbosacral plexopathy and  Polyradiculoneuropathy status post cervical cancer. Pt has completed pelvic floor PT recently and had positive outcomes with this and resolution of symptoms. Pt very motivated to participate in PT for bil LE strengthening and decreased fall risk. Pt demonstrated impairments with objective measurements of TUG and 5xSTS indicative of increased fall risk and balance deficits. Pt reports she is unable to hold and carry grandchildren, to walk on uneven surfaces, needs assistance with dressing, walking, transfers sometimes and wants to be more I. Pt would benefit from additional PT to further address deficits.    OBJECTIVE IMPAIRMENTS: Abnormal gait, decreased activity tolerance, decreased coordination, decreased endurance, decreased mobility, difficulty walking, decreased strength, increased fascial restrictions, increased muscle spasms, impaired flexibility, impaired sensation, improper body mechanics, postural dysfunction, and pain.   ACTIVITY LIMITATIONS: carrying, lifting, bending, sitting, standing, squatting, stairs, transfers, bed mobility, locomotion level, and caring for others  PARTICIPATION LIMITATIONS: meal prep, cleaning, laundry, interpersonal relationship, driving, shopping, community activity, occupation, and yard work  PERSONAL FACTORS: Fitness, Past/current experiences, Time since onset of injury/illness/exacerbation, and 1 comorbidity: medical history are also affecting patient's functional outcome.   REHAB POTENTIAL: Good  CLINICAL DECISION MAKING: Evolving/moderate complexity  EVALUATION COMPLEXITY: Moderate   GOALS: Goals reviewed with patient? Yes  SHORT TERM  GOALS: Target date: 02/13/24 Pt to be I with HEP for carry over and continuing recommendations for improved outcomes.   Baseline: Goal status: MET  2.  Pt to demonstrate at least 3/5 Rt LE strength grossly and 4/5 Lt for improved pelvic stability and functional squats without falling.  Baseline:  Goal status: IPARTIALLY MET 03/03/24  3.  Pt to demonstrate ability to complete static standing without AD for at least 5 mins at midline to complete gentle kitchen tasks/household tasks without fear of falling.  Baseline:  Goal status: MET  4.  Pt to demonstrate I with dynamic sitting balance to better be able to reach floor level for retrieving fallen objects and putting on socks without falling Baseline:  Goal status: MET 03/03/24  LONG TERM GOALS: Target date: 07/17/24  Pt to be I with advanced HEP for carry over and continuing recommendations for improved outcomes.   Baseline:  Goal status: INITIAL  2.  Pt to report improved LEFS score to at least 30/80 for improved I with functional tasks at home such as dressing, walking from room to room.  Baseline:  Goal status: INITIAL  3.  Pt to demonstrate improved dynamic standing balance with or without AD at no more than CGA to better be able to play with grandchildren without falling.  Baseline:  Goal status: INITIAL  4.  Pt to demonstrate improved gait mechanics with LRAD for 150' with minimal compensate strategies to I with pt safety with ambulating into a store.  Baseline:  Goal status: INITIAL  5.  Pt to demonstrate ability to perform x5 Sit to stand with at least 10# to more safely assist in caring for grandchildren without risk of falling.  Baseline:  Goal status: INITIAL    PLAN:  PT FREQUENCY: 2x/week  PT DURATION: 12 weeks  PLANNED INTERVENTIONS:    97110-Therapeutic exercises, 97530- Therapeutic activity, [id number]- Neuromuscular re-education, 97535- Self Care, [id number]- Manual therapy, [phone]- Gait training, [phone]- Canalith  repositioning, [id number]- Aquatic Therapy, [phone]- Splinting, [id number]- Electrical stimulation (manual), [id number]- Vasopneumatic device, [id number]- Ultrasound, [id number]- Traction (mechanical), [id number]- Ionotophoresis 4mg /ml Dexamethasone , Patient/Family education, Balance training, Stair training, Taping, Dry Needling, Joint mobilization, Joint manipulation, Spinal manipulation, Spinal mobilization, Scar mobilization, DME instructions, Wheelchair mobility training, Cryotherapy,  Moist heat, and Biofeedback  NEXT VISIT: aquatic PT, core and LE strengthening and neuromuscular re-ed  LAND: continue with resisted walking, focused standing glute med activites  Mliss Cummins, PT 03/24/24 5:36 PM

## 2024-03-24 ENCOUNTER — Encounter: Payer: Self-pay | Admitting: Physical Therapy

## 2024-03-24 ENCOUNTER — Ambulatory Visit: Admitting: Physical Therapy

## 2024-03-24 ENCOUNTER — Ambulatory Visit (INDEPENDENT_AMBULATORY_CARE_PROVIDER_SITE_OTHER): Payer: 59 | Admitting: Neurology

## 2024-03-24 ENCOUNTER — Encounter: Payer: Self-pay | Admitting: Neurology

## 2024-03-24 VITALS — BP 148/75 | HR 74 | Ht 61.0 in | Wt 112.4 lb

## 2024-03-24 DIAGNOSIS — M62838 Other muscle spasm: Secondary | ICD-10-CM

## 2024-03-24 DIAGNOSIS — R102 Pelvic and perineal pain: Secondary | ICD-10-CM

## 2024-03-24 DIAGNOSIS — M6281 Muscle weakness (generalized): Secondary | ICD-10-CM

## 2024-03-24 DIAGNOSIS — R279 Unspecified lack of coordination: Secondary | ICD-10-CM

## 2024-03-24 DIAGNOSIS — R293 Abnormal posture: Secondary | ICD-10-CM

## 2024-03-24 DIAGNOSIS — G541 Lumbosacral plexus disorders: Secondary | ICD-10-CM

## 2024-03-24 DIAGNOSIS — R269 Unspecified abnormalities of gait and mobility: Secondary | ICD-10-CM

## 2024-03-24 NOTE — Progress Notes (Signed)
 NEUROLOGY FOLLOW UP OFFICE NOTE  Anna Ramsey [id number]  Assessment/Plan:   Radiation-induced lumbosacral plexopathy Urinary retention - possibly has some degree of neurogenic bladder but mostly these events were due to episodes of severe pelvic pain    Baclofen  as needed Physical therapy and home exercises Follow up one year or sooner if needed  Total time spent in chart and face to face with patient:  20 minutes    Subjective:  Anna Ramsey. Anna Ramsey is a 58 year old female with HTN, thyroid disease, and history of cervical cancer who who follows up for progressive polyradiculoneuropathy.  She is accompanied by her husband who supplements history.   UPDATE: Doing well with physical therapy.  She is having less falls.      She most recently had another episode of severe pelvic pain a couple of days ago.  She is wearing a catheter for the next week.  She followed up with her urogynecologist.  Other than these episodes of severe pain, she denies bowel or bladder dysfunction.  She walks in the house holding onto the walls or furniture.  Outside of the house she uses a cane for short distances.  If she has to be out for an extended period, such as shopping, she will use her motorized wheelchair.     HISTORY: Patient has history of cervical cancer status post hysterectomy and pelvic radiation in 2015.  Around 2018, she sprained her right ankle.  She began noticing tingling and numbness on the bottom of her right foot that spread up her leg and then involved her left leg.  The numbness typically radiated up to her knees but sometimes it extends to the waist.  Her legs feel like cement and struggles to lift her legs.  Sometimes her right leg may give out.  History states chronic low back pain with radiculopathy but she denies back pain or radicular pain in the legs.  She has undergone epidural injections in the low back which has not helped the numbness.  She denies neuropathic  pain such as burning, stabbing or shocks .  She also had some cognitive changes, finding it more difficulty to multitask.  She was found to have hypothyroidism which has since been treated.  Other labs checked included B12 540, folate 6.2, B1 8, ferritin 128, Mg 2.1 and mildly elevated B6 of 29.2.  MRI of lumbar spine on 08/01/2017 showed levoscoliosis without any significant disc herniation, spinal or neuroforaminal stenosis.  She had another MRI of lumbar spine on 01/05/2020 which was also unremarkable and negative for any explanation.  MRI of cervical and thoracic spine in August 2021 showed right sided foraminal stenosis at C6-7 but otherwise unremarkable.  She started experiencing numbness and tingling in the mouth  MRI of brain without contrast on 01/05/2020 was normal.  She has been evaluated by neurology in [facility].  She underwent a NCV-EMG that reportedly showed mild to moderate peripheral neuropathy.  I saw patient in 2022.  Repeat NCV-EMG of lower extremities showed no evidence of polyneuropathy but did demonstrate findings suggestive of right worse than left L3-4 radiculopathy.  As imaging of the lumbar spine did not reveal any structural cause, she underwent LP for CSF analysis of an inflammatory etiology such as CIDP, but protein was normal.  CSF cell count 1, protein 31, glucose 52, gram stain with rare WBC but no organisms, cytology with no malignant cells, Lyme IgG/IgM negative, ACE 2, 3 oligoclonal bands (also detected in serum), IgG  index 0.55.  She had a repeat NCV-EMG on 02/09/2023 which showed interval worsening of sensory responses and significant progression of neurogenic changes in the lower extremities when compared to prior study from 12/29/2020, consistent with an active on chronic polyradiculoneuropathy.  She had a repeat LP on 6/3 which again was unremarkable with normal protein (cell count 2, protein 36, glucose 58, negative cytology, negative gram stain and culture, negative VDRL, ACE  8, negative oligoclonal bands, negative Lyme PCR.  Serum sensory (+/- motor) neuropathy panel from [facility] was negative (including anti-GD1a, anti-GD1b, anti-GM1, anti-MAG).  By August 2024, she was experiencing urinary retention.  Seen in the ED.  CT A/P with contrast revealed mild to moderate volume ascites and mild diffuse urinary bladder wall thickening as well as colonic diverticulosis.  Prescribed Cipro  for presumed cystitis.  She was seen in the [facility] in September.  Questioned 2 separate processes:  Carpal tunnel syndrome in the upper the extremities and a radiation induced lumbosacral plexopathy.  Underwent further testing.  Labs included Hgb A1c 5.5, B12 318, MMA 0.18, B6 31.1, copper 101, elevated vit E 26.8, SPEP/IFE negative paraprotein/monoclonal component, kappa/gamma light chains normal, total protein 6.7, ANA negative, borderline elevated RF 14, ESR 1, CRP 0.11 and CCP <0.5.  NCV-EMG on 06/11/2023 confirmed bilateral lumbosacral plexopathies (right worse than left) with myokymia supporting diagnosis of radiation-induced lumbosacral plexopathy.  Abnormalities in the right FDI and APB thought to be a chronic C8-T1 radiculopathy on the right without active denervation.  No definitive electrodiagnostic evidence for a large fiber polyneuropathy or multilevel bilateral lumbosacral radiculopathies.  In August 2024, she had an episode of severe pelvic pain that caused her to not be able to urinate.  She had an MRI of the C-spine without contrast on 07/28/2023 which showed right central C5-C6 disc protrusion indenting the ventral spinal cord but no compression or definite neuroforaminal stenosis.  MRI of pelvis with and without contrast showed enlargement and increased signal within the lumbosacral nerve roots with scattered muscular denervation changes, right worse than left, correlating with lumbar plexopathy.     No family history of progressive neuropathy.  PAST  MEDICAL HISTORY: Past Medical History:  Diagnosis Date   ADHD (attention deficit hyperactivity disorder) 2024   Dysuria    Erythematous bladder mucosa    Heart murmur    asymptomatic per pt --  no echo   Hematuria    History of cervical cancer    12/ 2014  Stage IB2--  s/p  TAH w/ BSO and pelvic lymphadectomy/  and radiation therapy   History of radiation therapy 10/20/13-11/28/13   pelvis 50.4 gray   Hypertension    Lesion of bladder    Mild intermittent asthma    Neuromuscular disorder (HCC)    Seasonal allergies    Thyroid disease     MEDICATIONS: Current Outpatient Medications on File Prior to Visit  Medication Sig Dispense Refill   albuterol  (VENTOLIN  HFA) 108 (90 Base) MCG/ACT inhaler Inhale 1-2 puffs into the lungs every 6 (six) hours as needed for wheezing or shortness of breath. 36 g 2   AMBULATORY NON FORMULARY MEDICATION Medication Name: R custom ankle foot orthoses. DX: M62.81,M21.371 [facility] fax: [phone] 1 each 0   amLODipine  (NORVASC ) 5 MG tablet Take 1 tablet (5 mg total) by mouth daily. 90 tablet 3   baclofen  (LIORESAL ) 10 MG tablet Take 1 tablet (10 mg total) by mouth 3 (three) times daily as needed for muscle spasms. 30 each  0   buPROPion  (WELLBUTRIN  XL) 150 MG 24 hr tablet Take 1 tablet (150 mg total) by mouth every morning. Needs appointment. 90 tablet 3   cetirizine (ZYRTEC) 10 MG tablet Take 10 mg by mouth at bedtime. (Patient taking differently: Take 10 mg by mouth as needed.)     estradiol  (ESTRACE ) 0.1 MG/GM vaginal cream Place 0.5 g vaginally 2 (two) times a week. Place 0.5g nightly for two weeks then twice a week after 42.5 g 11   fluticasone  (FLONASE ) 50 MCG/ACT nasal spray Place 2 sprays into both nostrils daily as needed.      GUANFACINE HCL ER PO Take 1 mg by mouth daily.     levothyroxine  (SYNTHROID ) 88 MCG tablet Take 1 tablet by mouth once daily 90 tablet 0   methylphenidate 27 MG PO CR tablet Take 27 mg by mouth daily.     metoprolol  tartrate  (LOPRESSOR ) 100 MG tablet Take 1 tablet by mouth twice daily 180 tablet 0   PARoxetine  (PAXIL ) 30 MG tablet Take 1 tablet by mouth once daily 90 tablet 0   pregabalin  (LYRICA ) 100 MG capsule Take 1 capsule (100 mg total) by mouth 2 (two) times daily. 180 capsule 1   tamsulosin  (FLOMAX ) 0.4 MG CAPS capsule Take 1 capsule by mouth once daily 30 capsule 0   traZODone  (DESYREL ) 50 MG tablet TAKE 1 TABLET BY MOUTH EVERY DAY AT BEDTIME 90 tablet 0   Vibegron  (GEMTESA ) 75 MG TABS Take 1 tablet (75 mg total) by mouth daily. 30 tablet 3   cyanocobalamin (VITAMIN B12) 1000 MCG tablet Take 1,000 mcg by mouth daily.     Current Facility-Administered Medications on File Prior to Visit  Medication Dose Route Frequency Provider Last Rate Last Admin   lidocaine  (XYLOCAINE ) 2 % jelly 1 Application  1 Application Urethral Once         ALLERGIES: Allergies  Allergen Reactions   Sulfa Antibiotics Other (See Comments)    Numbness in lower extremities from knees down   Cefdinir    Penicillins Other (See Comments)    Unknown childhood reaction    FAMILY HISTORY: Family History  Problem Relation Age of Onset   Heart attack Father 72   Diabetes Father    Hypertension Maternal Grandmother    Stroke Maternal Grandmother    Cancer Mother        breast/thyroid      Objective:  Blood pressure (!) 148/75, pulse 74, height 5' 1 (1.549 m), weight 112 lb 6.4 oz (51 kg), last menstrual period 08/19/2013, SpO2 96%. General: No acute distress.  Patient appears well-groomed.   Head:  Normocephalic/atraumatic Eyes:  Fundi examined but not visualized Neck: supple, no paraspinal tenderness, full range of motion Heart:  Regular rate and rhyth Neurological Exam: alert and oriented.  Speech fluent and not dysarthric, language intact.  CN II-XII intact. Bulk and tone normal, muscle strength 4-/5 bilateral hip flexion, 4+/5 right knee extension, bilateral ankle dorsiflexion and bilateral EHLs, 5-/5 left knee extension,  otherwise, 5/5  Sensation to pinprick reduced in feet up to above right knee and just below left knee, vibratory sensation reduced in bilateral lower extremities, more severe on the right.  Deep tendon reflexes 2+ upper extremities, absent lower extremities, toes downgoing.  Finger to nose testing intact.  Able to ambulate without cane, steppage gait.  Romberg with sway.   Juliene Dunnings, DO  CC: Dorothyann Byars, MD

## 2024-03-26 ENCOUNTER — Ambulatory Visit: Attending: Neurology | Admitting: Physical Therapy

## 2024-03-26 ENCOUNTER — Encounter: Admitting: Physical Therapy

## 2024-03-26 ENCOUNTER — Encounter: Payer: Self-pay | Admitting: Physical Therapy

## 2024-03-26 DIAGNOSIS — R269 Unspecified abnormalities of gait and mobility: Secondary | ICD-10-CM | POA: Insufficient documentation

## 2024-03-26 DIAGNOSIS — M62838 Other muscle spasm: Secondary | ICD-10-CM | POA: Diagnosis present

## 2024-03-26 DIAGNOSIS — M6281 Muscle weakness (generalized): Secondary | ICD-10-CM | POA: Insufficient documentation

## 2024-03-26 DIAGNOSIS — R279 Unspecified lack of coordination: Secondary | ICD-10-CM | POA: Insufficient documentation

## 2024-03-26 DIAGNOSIS — R102 Pelvic and perineal pain: Secondary | ICD-10-CM | POA: Diagnosis present

## 2024-03-26 DIAGNOSIS — R293 Abnormal posture: Secondary | ICD-10-CM | POA: Diagnosis present

## 2024-03-26 NOTE — Therapy (Signed)
 OUTPATIENT PHYSICAL THERAPY FEMALE PELVIC AND ORTHO TREATMENT   Patient Name: Anna Ramsey MRN: [id number] DOB:01-22-1966, 58 y.o., female Today's Date: 03/26/2024  END OF SESSION:  PT End of Session - 03/26/24 0845     Visit Number 18    Date for PT Re-Evaluation 07/17/24    Authorization Type [employer]    PT Start Time 0845    PT Stop Time 0930    PT Time Calculation (min) 45 min    Activity Tolerance Patient tolerated treatment well    Behavior During Therapy [facility] for tasks assessed/performed                      Past Medical History:  Diagnosis Date   ADHD (attention deficit hyperactivity disorder) 2024   Dysuria    Erythematous bladder mucosa    Heart murmur    asymptomatic per pt --  no echo   Hematuria    History of cervical cancer    12/ 2014  Stage IB2--  s/p  TAH w/ BSO and pelvic lymphadectomy/  and radiation therapy   History of radiation therapy 10/20/13-11/28/13   pelvis 50.4 gray   Hypertension    Lesion of bladder    Mild intermittent asthma    Neuromuscular disorder (HCC)    Seasonal allergies    Thyroid disease    Past Surgical History:  Procedure Laterality Date   CYSTOSCOPY WITH BIOPSY N/A 04/29/2015   Procedure: CYSTOSCOPY WITH BIOPSY WITH FULGERATION;  Surgeon: Belvie LITTIE Clara, MD;  Location: [facility];  Service: Urology;  Laterality: N/A;  1 HR [phone] [id number]   FOOT SURGERY Left 2010   RADICAL ABDOMINAL HYSTERECTOMY  09-05-2013   w/ BILATERAL SALPINGOOPHORECTOMY AND PELVIC LYMPHADECTOMY   Patient Active Problem List   Diagnosis Date Noted   SUI (stress urinary incontinence, female) 11/01/2023   History of urinary retention 11/01/2023   Neurogenic bladder 10/18/2023   Hematuria 10/18/2023   Acute urinary retention 10/18/2023   Preventive measure 12/20/2022   Stress and adjustment reaction 01/13/2021   Inattention 10/28/2019   Hypothyroid 11/13/2016   Neuropathy involving both lower  extremities 11/13/2016   Cystitis, radiation 05/22/2014   Cervical cancer (HCC) 09/01/2013   Adenocarcinoma of cervix, 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   Asthma, mild intermittent 07/07/2009   INSOMNIA 05/28/2008   Generalized anxiety disorder 05/07/2008   HYPERTENSION, BENIGN 05/31/2007    PCP: Geraline Dorothyann JONETTA, MD  REFERRING PROVIDER: Skeet Juliene SAUNDERS, DO   REFERRING DIAG:  [phone] (ICD-10-CM) - Bilateral leg weakness G62.89 (ICD-10-CM) - 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    Eval: Right leg worse than left but both painful. Right has instances of knee just giving out, has neuropathy - like weakness, pain since radiation in 2015. The nerves have been affected and greatly limiting her activity. Does have symptoms of numbness, sensory deficits, weakness, ataxic gait, and painful but all vary based on the day.     Fluid intake: Yes: water    PAIN:  Are you having pain? A lot of RTLE numbness, hips feel 'off. NPRS scale:0 Pain location: bil legs Right leg  Pain type: aching Pain description: constant   Aggravating factors: walking, end of day, rainy days Relieving factors: try not to walk, decreasing activities    PRECAUTIONS: Other: cervical cancer with radiation  RED FLAGS: None   WEIGHT BEARING RESTRICTIONS: No  FALLS:   Has patient fallen in last 6 months? Yes. Number of falls 1x/weekly due to weakness and decreased balance from having the radiation, trouble with low light - reports she does need to furniture walk to help with balance  LIVING ENVIRONMENT: Lives with: lives with their family  OCCUPATION: sits with laptop on her lap for 12 hours per day; unable to sit with legs down due to them falling asleep  PLOF: Independent  PATIENT GOALS: to strengthening enough to walk across grass, wants to have better balance, stronger legs                                        PERTINENT HISTORY:  ADHD; Cervical Cancer 2014; 9 weeks of radiation; Radical hysterectomy;    OBJECTIVE:  Note: Objective measures were completed at Evaluation unless otherwise noted.  DIAGNOSTIC FINDINGS:   PATIENT SURVEYS:  Lower Extremity Functional Score: 5 / 80 = 6.3 %  COGNITION: Overall cognitive status: Within functional limits for tasks assessed     SENSATION: Poor sensation at Rt LE - light touch felt along posterior gastroc only no awareness without visualization of being touched at medial, lateral, anterior lower leg, reports numbness in bil feet Rt>Lt  EDEMA:  Rt ankle sometimes swells per pt  MUSCLE LENGTH: Bil hamstrings and adductors limited by 25%;  POSTURE: rounded shoulders and forward head  LOWER EXTREMITY ROM:  weakness and poor coordination/proprioception   LOWER EXTREMITY MMT:  02/04/24 hip flexion was R 3/5, L 4/5  MMT Right Eval */5 Left Eval */5 Right 5/16 Left 5/16 RIGHT 6/6 LEFT  6/6  Hip flexion 2 3 4+ 5 3   Hip extension 2 3 4+ 4 4+ 4  Hip abduction 3 3+ 5- 5 5   Hip adduction 1+ 2+ 2+ 2+ 2+ 2+  Hip internal rotation        Hip external rotation        Knee flexion 2 3 3- 4 3+ 5  Knee extension 2+ 3 3- 5 3+   Ankle dorsiflexion 0 4 0 4+ long sitting 2 4  4+ long sitting 2    Ankle plantarflexion 3 4      Ankle inversion 1 4 3+ tested in long sitting 4+ tested in long  sitting 3+ in long sitting 5 in long sitting  Ankle eversion 2 4 3+ tested in long sitting 4- tested in long sitting 2+ 4- tested in long sitting   (Blank rows =  not tested)   FUNCTIONAL TESTS:  Five times Sit to Stand Test (FTSS) Method: Use a straight back chair with a solid seat that is 16-18" high. Ask participant to sit on the chair with arms folded across their chest.   Instructions: "Stand up and sit down as quickly as possible 5 times, keeping your arms folded across your chest."   Measurement: Stop timing when the participant stands the 5th time.  TIME: ______ (in seconds)  Times > 13.6 seconds is associated with increased disability and morbidity (Guralnik, 2000) Times > 15 seconds is predictive of recurrent falls in healthy individuals aged 85 and older (Buatois, et al., 2008) Normal performance values in community dwelling individuals aged 56 and older (Bohannon, 2006): 50-59 years: 8 seconds 60-69 years: 11.4 seconds 70-79 years: 12.6 seconds 80-89 years: 14.8 seconds  MCID: >= 2.3 seconds for Vestibular Disorders (Meretta, 2006)  5 times sit to stand: 22s with hands - very unstable with poor hip, posture, core control  Timed up and go (TUG): 28s with SBQC - min A  GAIT: Distance walked: 200' Assistive device utilized: Quad cane small base Level of assistance: CGA Comments: Rt LE demonstrating ataxic pattern, Lt trunk lean intermittently  to right self, poor step height bil, decreased stride length, limited glute activation                                                                                                                                 TREATMENT DATE:   03/26/24; Pt arrives for aquatic physical therapy. Treatment took place in 3.5-5.5 feet of water . Water  temperature was. Pt entered the pool via stairs with heavy use of railings, step to step: PTA SBA. Pt requires buoyancy of water  for support and to offload joints with strengthening exercises.  Seated  water  bench with 75% submersion Pt performed seated LE AROM exercises 20x in all planes, concurrent discussion of current status. 75% depth water  walking with PTA CGA at pelvis. 8 lengths in each direction, pt requires small noodle for balance. Standing slow marching 10x Bil, ham curls Bil 10x and hip abduction 10x holding onto wall. Standing single leg stance 3x 10 sec holding large noodle Bil.  Seated decompression with large noodle behind patient to rest low back 2 min 2x bouts. Prone float with large noodle for bil quad stretch, VC to flex knees. Supine float holding large noodle across chest then hip add/abd 10x, VC to use her abdominals more for control.  03/24/2024 Nu step seat 4,UE 9, Lev 5 x 6 min Glute med ball press with small beach ball and inside foot on 4 inch step Single leg taps 6 inch challenging so went to 4 inch taps B fwd and lateral focusing on glute med engagement and core stab to avoid lumbar extension Gait: adjusted cane to L UE and correct height used mirror for VC Discussed Foot-up brace to assist with DF  03/19/24:Pt arrives for aquatic  physical therapy. Treatment took place in 3.5-5.5 feet of water . Water  temperature was. Pt entered the pool via stairs with heavy use of railings, step to step: PTA SBA. Pt requires buoyancy of water  for support and to offload joints with strengthening exercises.  Seated water  bench with 75% submersion Pt performed seated LE AROM exercises 20x in all planes, concurrent discussion of current status. 75% depth water  walking with PTA CGA at pelvis. 8 lengths in each direction, pt requires small noodle for balance. Standing slow marching 10x Bil, ham curls Bil 10x and hip abduction 10x holding onto wall. Standing single leg stance 3x 10 sec holding large noodle Bil.  Seated decompression with large noodle behind patient to rest low back 2 min 2x bouts. Prone float with large noodle for bil quad stretch, VC to flex knees. Supine float holding large  noodle across chest then hip add/abd 10x, VC to use her abdominals more for control.    PATIENT EDUCATION:  Education details: [email or web address] - uses App Person educated: Patient Education method: Explanation, Demonstration, Tactile cues, Verbal cues, and Handouts Education comprehension: verbalized understanding, returned demonstration, verbal cues required, tactile cues required, and needs further education  HOME EXERCISE PROGRAM: Access Code: [email or web address] URL: [email or web address] Date: 02/04/2024 Prepared by: Mliss  Exercises - Supine Heel Slide with Strap  - 1 x daily - 7 x weekly - 3 sets - 10 reps - Pillow squeezes with kegel  - 1 x daily - 7 x weekly - 3 sets - 10 reps - Sit to Stand with Counter Support  - 1 x daily - 7 x weekly - 1 sets - 10 reps - Seated Long Arc Quad  - 1 x daily - 7 x weekly - 1-3 sets - 10 reps - 5 sec hold - Seated Isometric Hip Abduction with Resistance  - 1 x daily - 7 x weekly - 1-3 sets - 10 reps - Heel Raises with Counter Support  - 1 x daily - 7 x weekly - 1-3 sets - 10 reps - Standing Terminal Knee Extension at Wall with Ball  - 2 x daily - 7 x weekly - 2 sets - 10 reps - 5 sec hold - Squat with Chair Touch and Resistance Loop  - 1 x daily - 3 x weekly - 2 sets - 10 reps - Standing Knee Flexion with Counter Support  - 1 x daily - 3 x weekly - 2 sets - 10 reps - Supine Bridge with Resistance Band  - 1 x daily - 7 x weekly - 1-2 sets - 10 reps - 5 sec hold  ASSESSMENT:  CLINICAL IMPRESSION: Pt saw neurologist this week. He is encouraged and pleased with her progress over the last year. She is trying to ambulate with cane in left hand more consistently. Continues to excel in the water : no pain ever ane the water  serves as a an excellent feedback for her muscles. The waters buoyancy also helps steady her more than on land so she can focus more on her actual movement and contraction of a particular muscle.  Eval: Patient is a 58 y.o. female  who  was seen today for physical therapy evaluation and treatment for poor gait mechanics, impaired posture, impaired balance, decreased hip and core strength, increased fall risk. Pt has complex history of  Radiation-induced lumbosacral plexopathy and Polyradiculoneuropathy status post cervical cancer. Pt has completed pelvic floor PT recently and had positive outcomes with this and resolution of symptoms. Pt very motivated to participate  in PT for bil LE strengthening and decreased fall risk. Pt demonstrated impairments with objective measurements of TUG and 5xSTS indicative of increased fall risk and balance deficits. Pt reports she is unable to hold and carry grandchildren, to walk on uneven surfaces, needs assistance with dressing, walking, transfers sometimes and wants to be more I. Pt would benefit from additional PT to further address deficits.    OBJECTIVE IMPAIRMENTS: Abnormal gait, decreased activity tolerance, decreased coordination, decreased endurance, decreased mobility, difficulty walking, decreased strength, increased fascial restrictions, increased muscle spasms, impaired flexibility, impaired sensation, improper body mechanics, postural dysfunction, and pain.   ACTIVITY LIMITATIONS: carrying, lifting, bending, sitting, standing, squatting, stairs, transfers, bed mobility, locomotion level, and caring for others  PARTICIPATION LIMITATIONS: meal prep, cleaning, laundry, interpersonal relationship, driving, shopping, community activity, occupation, and yard work  PERSONAL FACTORS: Fitness, Past/current experiences, Time since onset of injury/illness/exacerbation, and 1 comorbidity: medical history are also affecting patient's functional outcome.   REHAB POTENTIAL: Good  CLINICAL DECISION MAKING: Evolving/moderate complexity  EVALUATION COMPLEXITY: Moderate   GOALS: Goals reviewed with patient? Yes  SHORT TERM GOALS: Target date: 02/13/24 Pt to be I with HEP for carry over and  continuing recommendations for improved outcomes.   Baseline: Goal status: MET  2.  Pt to demonstrate at least 3/5 Rt LE strength grossly and 4/5 Lt for improved pelvic stability and functional squats without falling.  Baseline:  Goal status: IPARTIALLY MET 03/03/24  3.  Pt to demonstrate ability to complete static standing without AD for at least 5 mins at midline to complete gentle kitchen tasks/household tasks without fear of falling.  Baseline:  Goal status: MET  4.  Pt to demonstrate I with dynamic sitting balance to better be able to reach floor level for retrieving fallen objects and putting on socks without falling Baseline:  Goal status: MET 03/03/24  LONG TERM GOALS: Target date: 07/17/24  Pt to be I with advanced HEP for carry over and continuing recommendations for improved outcomes.   Baseline:  Goal status: INITIAL  2.  Pt to report improved LEFS score to at least 30/80 for improved I with functional tasks at home such as dressing, walking from room to room.  Baseline:  Goal status: INITIAL  3.  Pt to demonstrate improved dynamic standing balance with or without AD at no more than CGA to better be able to play with grandchildren without falling.  Baseline:  Goal status: INITIAL  4.  Pt to demonstrate improved gait mechanics with LRAD for 150' with minimal compensate strategies to I with pt safety with ambulating into a store.  Baseline:  Goal status: INITIAL  5.  Pt to demonstrate ability to perform x5 Sit to stand with at least 10# to more safely assist in caring for grandchildren without risk of falling.  Baseline:  Goal status: INITIAL    PLAN:  PT FREQUENCY: 2x/week  PT DURATION: 12 weeks  PLANNED INTERVENTIONS:    97110-Therapeutic exercises, 97530- Therapeutic activity, [id number]- Neuromuscular re-education, 97535- Self Care, [id number]- Manual therapy, [id number]- Gait training, [phone]- Canalith repositioning, [id number]- Aquatic Therapy, [phone]- Splinting, [id number]-  Electrical stimulation (manual), [id number]- Vasopneumatic device, [id number]- Ultrasound, [id number]- Traction (mechanical), [id number]- Ionotophoresis 4mg /ml Dexamethasone , Patient/Family education, Balance training, Stair training, Taping, Dry Needling, Joint mobilization, Joint manipulation, Spinal manipulation, Spinal mobilization, Scar mobilization, DME instructions, Wheelchair mobility training, Cryotherapy, Moist heat, and Biofeedback  NEXT VISIT: aquatic PT, core and LE strengthening and neuromuscular re-ed  LAND: continue with resisted walking, focused standing glute med activities.  Pt out of town next week.  Delon Darner, PTA 03/26/24 10:40 AM

## 2024-04-04 NOTE — Therapy (Signed)
 OUTPATIENT PHYSICAL THERAPY FEMALE PELVIC AND ORTHO TREATMENT   Patient Name: Anna Ramsey MRN: [id number] DOB:07-Dec-1965, 58 y.o., female Today's Date: 04/07/2024  END OF SESSION:  PT End of Session - 04/07/24 0800     Visit Number 19    Date for PT Re-Evaluation 07/17/24    Authorization Type [employer]    PT Start Time 0801    PT Stop Time 0846    PT Time Calculation (min) 45 min    Activity Tolerance Patient tolerated treatment well    Behavior During Therapy [facility] for tasks assessed/performed                       Past Medical History:  Diagnosis Date   ADHD (attention deficit hyperactivity disorder) 2024   Dysuria    Erythematous bladder mucosa    Heart murmur    asymptomatic per pt --  no echo   Hematuria    History of cervical cancer    12/ 2014  Stage IB2--  s/p  TAH w/ BSO and pelvic lymphadectomy/  and radiation therapy   History of radiation therapy 10/20/13-11/28/13   pelvis 50.4 gray   Hypertension    Lesion of bladder    Mild intermittent asthma    Neuromuscular disorder (HCC)    Seasonal allergies    Thyroid disease    Past Surgical History:  Procedure Laterality Date   CYSTOSCOPY WITH BIOPSY N/A 04/29/2015   Procedure: CYSTOSCOPY WITH BIOPSY WITH FULGERATION;  Surgeon: Belvie LITTIE Clara, MD;  Location: [facility];  Service: Urology;  Laterality: N/A;  1 HR [id number]   FOOT SURGERY Left 2010   RADICAL ABDOMINAL HYSTERECTOMY  09-05-2013   w/ BILATERAL SALPINGOOPHORECTOMY AND PELVIC LYMPHADECTOMY   Patient Active Problem List   Diagnosis Date Noted   SUI (stress urinary incontinence, female) 11/01/2023   History of urinary retention 11/01/2023   Neurogenic bladder 10/18/2023   Hematuria 10/18/2023   Acute urinary retention 10/18/2023   Preventive measure 12/20/2022   Stress and adjustment reaction 01/13/2021   Inattention 10/28/2019   Hypothyroid 11/13/2016   Neuropathy involving both lower  extremities 11/13/2016   Cystitis, radiation 05/22/2014   Cervical cancer (HCC) 09/01/2013   Adenocarcinoma of cervix, 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   Asthma, mild intermittent 07/07/2009   INSOMNIA 05/28/2008   Generalized anxiety disorder 05/07/2008   HYPERTENSION, BENIGN 05/31/2007    PCP: Geraline Dorothyann JONETTA, MD  REFERRING PROVIDER: Skeet Juliene SAUNDERS, DO   REFERRING DIAG:  [phone] (ICD-10-CM) - Bilateral leg weakness G62.89 (ICD-10-CM) - 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 She was able to walk on the beach and only lost her balance once. She also had to climb stairs daily.     Eval: Right leg worse than left but both painful. Right has instances of knee just giving out, has neuropathy - like weakness, pain since radiation in 2015. The nerves have been affected and greatly limiting her activity. Does have symptoms of numbness, sensory deficits, weakness, ataxic gait, and painful but all vary based on the day.     Fluid intake: Yes: water    PAIN:  Are you having pain? A lot of RTLE numbness, hips feel 'off. NPRS scale:0 Pain location: bil legs Right leg  Pain type: aching Pain description: constant   Aggravating factors: walking, end of day, rainy days Relieving factors: try not to walk, decreasing activities    PRECAUTIONS: Other: cervical cancer with radiation  RED FLAGS: None   WEIGHT BEARING RESTRICTIONS: No  FALLS:  Has patient fallen in last  6 months? Yes. Number of falls 1x/weekly due to weakness and decreased balance from having the radiation, trouble with low light - reports she does need to furniture walk to help with balance  LIVING ENVIRONMENT: Lives with: lives with their family  OCCUPATION: sits with laptop on her lap for 12 hours per day; unable to sit with legs down due to them falling asleep  PLOF: Independent  PATIENT GOALS: to strengthening enough to walk across grass, wants to have better balance, stronger legs                                        PERTINENT HISTORY:  ADHD; Cervical Cancer 2014; 9 weeks of radiation; Radical hysterectomy;    OBJECTIVE:  Note: Objective measures were completed at Evaluation unless otherwise noted.  DIAGNOSTIC FINDINGS:   PATIENT SURVEYS:  Lower Extremity Functional Score: 5 / 80 = 6.3 %  COGNITION: Overall cognitive status: Within functional limits for tasks assessed     SENSATION: Poor sensation at Rt LE - light touch felt along posterior gastroc only no awareness without visualization of being touched at medial, lateral, anterior lower leg, reports numbness in bil feet Rt>Lt  EDEMA:  Rt ankle sometimes swells per pt  MUSCLE LENGTH: Bil hamstrings and adductors limited by 25%;  POSTURE: rounded shoulders and forward head  LOWER EXTREMITY ROM:  weakness and poor coordination/proprioception   LOWER EXTREMITY MMT:  02/04/24 hip flexion was R 3/5, L 4/5  MMT Right Eval */5 Left Eval */5 Right 5/16 Left 5/16 RIGHT 6/6 LEFT  6/6  Hip flexion 2 3 4+ 5 3   Hip extension 2 3 4+ 4 4+ 4  Hip abduction 3 3+ 5- 5 5   Hip adduction 1+ 2+ 2+ 2+ 2+ 2+  Hip internal rotation        Hip external rotation        Knee flexion 2 3 3- 4 3+ 5  Knee extension 2+ 3 3- 5 3+   Ankle dorsiflexion 0 4 0 4+ long sitting 2 4  4+ long sitting 2    Ankle plantarflexion 3 4      Ankle inversion 1 4 3+ tested in long sitting 4+ tested in long sitting 3+ in long sitting 5 in  long sitting  Ankle eversion 2 4 3+ tested in long sitting 4- tested in long sitting 2+ 4-  tested in long sitting   (Blank rows = not tested)   FUNCTIONAL TESTS:  Five times Sit to Stand Test (FTSS) Method: Use a straight back chair with a solid seat that is 16-18" high. Ask participant to sit on the chair with arms folded across their chest.   Instructions: "Stand up and sit down as quickly as possible 5 times, keeping your arms folded across your chest."   Measurement: Stop timing when the participant stands the 5th time.  TIME: ______ (in seconds)  Times > 13.6 seconds is associated with increased disability and morbidity (Guralnik, 2000) Times > 15 seconds is predictive of recurrent falls in healthy individuals aged 16 and older (Buatois, et al., 2008) Normal performance values in community dwelling individuals aged 75 and older (Bohannon, 2006): 50-59 years: 8 seconds 60-69 years: 11.4 seconds 70-79 years: 12.6 seconds 80-89 years: 14.8 seconds  MCID: >= 2.3 seconds for Vestibular Disorders (Meretta, 2006)  5 times sit to stand: 22s with hands - very unstable with poor hip, posture, core control  Timed up and go (TUG): 28s with SBQC - min A  GAIT: Distance walked: 200' Assistive device utilized: Quad cane small base Level of assistance: CGA Comments: Rt LE demonstrating ataxic pattern, Lt trunk lean intermittently  to right self, poor step height bil, decreased stride length, limited glute activation                                                                                                                                 TREATMENT DATE:  04/07/24 Nu step seat 4,UE 9, Lev 5 x 6 min Glute med ball press with large beach ball at barre and inside foot on 4 inch step Single leg taps 6 inch challenging so went to 4 inch taps B fwd and lateral focusing on glute med engagement and core stab to avoid lumbar extension Gait: adjusted cane to L UE and correct height used mirror  for VC Hip hike off 2 inch step - challenging to raise hip so did supine for form then re-tried Pallof press yellow at barre and then rotations with straight arms   03/26/24; Pt arrives for aquatic physical therapy. Treatment took place in 3.5-5.5 feet of water . Water  temperature was. Pt entered the pool via stairs with heavy use of railings, step to step: PTA SBA. Pt requires buoyancy of water  for support and to offload joints with strengthening exercises.  Seated water  bench with 75% submersion Pt performed seated LE AROM exercises 20x in all planes, concurrent discussion of current status. 75% depth water  walking with PTA CGA at pelvis. 8 lengths in each direction, pt requires small noodle for balance. Standing slow marching 10x Bil, ham curls Bil 10x and hip abduction 10x holding onto wall. Standing single leg stance 3x 10 sec holding large noodle Bil.  Seated decompression with large noodle behind patient to rest low back 2 min 2x bouts. Prone float with large  noodle for bil quad stretch, VC to flex knees. Supine float holding large noodle across chest then hip add/abd 10x, VC to use her abdominals more for control.  03/24/2024 Nu step seat 4,UE 9, Lev 5 x 6 min Glute med ball press with small beach ball and inside foot on 4 inch step Single leg taps 6 inch challenging so went to 4 inch taps B fwd and lateral focusing on glute med engagement and core stab to avoid lumbar extension Gait: adjusted cane to L UE and correct height used mirror for VC Discussed Foot-up brace to assist with DF  03/19/24:Pt arrives for aquatic physical therapy. Treatment took place in 3.5-5.5 feet of water . Water  temperature was. Pt entered the pool via stairs with heavy use of railings, step to step: PTA SBA. Pt requires buoyancy of water  for support and to offload joints with strengthening exercises.  Seated water  bench with 75% submersion Pt performed seated LE AROM exercises 20x in all planes, concurrent discussion of  current status. 75% depth water  walking with PTA CGA at pelvis. 8 lengths in each direction, pt requires small noodle for balance. Standing slow marching 10x Bil, ham curls Bil 10x and hip abduction 10x holding onto wall. Standing single leg stance 3x 10 sec holding large noodle Bil.  Seated decompression with large noodle behind patient to rest low back 2 min 2x bouts. Prone float with large noodle for bil quad stretch, VC to flex knees. Supine float holding large noodle across chest then hip add/abd 10x, VC to use her abdominals more for control.    PATIENT EDUCATION:  Education details: [email or web address] - uses App Person educated: Patient Education method: Explanation, Demonstration, Tactile cues, Verbal cues, and Handouts Education comprehension: verbalized understanding, returned demonstration, verbal cues required, tactile cues required, and needs further education  HOME EXERCISE PROGRAM: Access Code: [email or web address] URL: [email or web address] Date: 04/07/2024 Prepared by: Mliss  Exercises - Supine Heel Slide with Strap  - 1 x daily - 7 x weekly - 3 sets - 10 reps - Pillow squeezes with kegel  - 1 x daily - 7 x weekly - 3 sets - 10 reps - Sit to Stand with Counter Support  - 1 x daily - 7 x weekly - 1 sets - 10 reps - Seated Long Arc Quad  - 1 x daily - 7 x weekly - 1-3 sets - 10 reps - 5 sec hold - Seated Isometric Hip Abduction with Resistance  - 1 x daily - 7 x weekly - 1-3 sets - 10 reps - Heel Raises with Counter Support  - 1 x daily - 7 x weekly - 1-3 sets - 10 reps - Standing Terminal Knee Extension at Wall with Ball  - 2 x daily - 7 x weekly - 2 sets - 10 reps - 5 sec hold - Squat with Chair Touch and Resistance Loop  - 1 x daily - 3 x weekly - 2 sets - 10 reps - Standing Knee Flexion with Counter Support  - 1 x daily - 3 x weekly - 2 sets - 10 reps - Supine Bridge with Resistance Band  - 1 x daily - 7 x weekly - 1-2 sets - 10 reps - 5 sec hold - Standing Hip Abduction on  Slider  - 1 x daily - 3 x weekly - 2 sets - 10 reps - Calf stretch on rocker board  - 1 x daily - 7 x weekly - 1 sets - 3 reps - 30 sec  hold - Standing Anti-Rotation Press with Anchored Resistance  - 1 x daily - 3 x weekly - 2 sets - 10 reps - Standing Trunk Rotation with Resistance  - 1 x daily - 3 x weekly - 2 sets - 10 reps  ASSESSMENT:  CLINICAL IMPRESSION: Focused on more glute med strength today and also added Pallof press and rotations as patient demonstrated lumbar weakness with hip hikes. May benefit from trying hip hikes in the pool for glute med strength.     Eval: Patient is a 58 y.o. female  who was seen today for physical therapy evaluation and treatment for poor gait mechanics, impaired posture, impaired balance, decreased hip and core strength, increased fall risk. Pt has complex history of  Radiation-induced lumbosacral plexopathy and Polyradiculoneuropathy status post cervical cancer. Pt has completed pelvic floor PT recently and had positive outcomes with this and resolution of symptoms. Pt very motivated to participate in PT for bil LE strengthening and decreased fall risk. Pt demonstrated impairments with objective measurements of TUG and 5xSTS indicative of increased fall risk and balance deficits. Pt reports she is unable to hold and carry grandchildren, to walk on uneven surfaces, needs assistance with dressing, walking, transfers sometimes and wants to be more I. Pt would benefit from additional PT to further address deficits.    OBJECTIVE IMPAIRMENTS: Abnormal gait, decreased activity tolerance, decreased coordination, decreased endurance, decreased mobility, difficulty walking, decreased strength, increased fascial restrictions, increased muscle spasms, impaired flexibility, impaired sensation, improper body mechanics, postural dysfunction, and pain.   ACTIVITY LIMITATIONS: carrying, lifting, bending, sitting, standing, squatting, stairs, transfers, bed mobility, locomotion  level, and caring for others  PARTICIPATION LIMITATIONS: meal prep, cleaning, laundry, interpersonal relationship, driving, shopping, community activity, occupation, and yard work  PERSONAL FACTORS: Fitness, Past/current experiences, Time since onset of injury/illness/exacerbation, and 1 comorbidity: medical history are also affecting patient's functional outcome.   REHAB POTENTIAL: Good  CLINICAL DECISION MAKING: Evolving/moderate complexity  EVALUATION COMPLEXITY: Moderate   GOALS: Goals reviewed with patient? Yes  SHORT TERM GOALS: Target date: 02/13/24 Pt to be I with HEP for carry over and continuing recommendations for improved outcomes.   Baseline: Goal status: MET  2.  Pt to demonstrate at least 3/5 Rt LE strength grossly and 4/5 Lt for improved pelvic stability and functional squats without falling.  Baseline:  Goal status: IPARTIALLY MET 03/03/24  3.  Pt to demonstrate ability to complete static standing without AD for at least 5 mins at midline to complete gentle kitchen tasks/household tasks without fear of falling.  Baseline:  Goal status: MET  4.  Pt to demonstrate I with dynamic sitting balance to better be able to reach floor level for retrieving fallen objects and putting on socks without falling Baseline:  Goal status: MET 03/03/24  LONG TERM GOALS: Target date: 07/17/24  Pt to be I with advanced HEP for carry over and continuing recommendations for improved outcomes.   Baseline:  Goal status: INITIAL  2.  Pt to report improved LEFS score to at least 30/80 for improved I with functional tasks at home such as dressing, walking from room to room.  Baseline:  Goal status: INITIAL  3.  Pt to demonstrate improved dynamic standing balance with or without AD at no more than CGA to better be able to play with grandchildren without falling.  Baseline:  Goal status: INITIAL  4.  Pt to demonstrate improved gait mechanics with LRAD for 150' with minimal compensate  strategies to I with pt  safety with ambulating into a store.  Baseline:  Goal status: INITIAL  5.  Pt to demonstrate ability to perform x5 Sit to stand with at least 10# to more safely assist in caring for grandchildren without risk of falling.  Baseline:  Goal status: INITIAL    PLAN:  PT FREQUENCY: 2x/week  PT DURATION: 12 weeks  PLANNED INTERVENTIONS:    97110-Therapeutic exercises, 97530- Therapeutic activity, [id number]- Neuromuscular re-education, 97535- Self Care, [id number]- Manual therapy, [id number]- Gait training, [id number]- Canalith repositioning, [id number]- Aquatic Therapy, [phone]- Splinting, [id number]- Electrical stimulation (manual), [id number]- Vasopneumatic device, [id number]- Ultrasound, [id number]- Traction (mechanical), [id number]- Ionotophoresis 4mg /ml Dexamethasone , Patient/Family education, Balance training, Stair training, Taping, Dry Needling, Joint mobilization, Joint manipulation, Spinal manipulation, Spinal mobilization, Scar mobilization, DME instructions, Wheelchair mobility training, Cryotherapy, Moist heat, and Biofeedback  NEXT VISIT: aquatic PT, core and LE strengthening and neuromuscular re-ed  LAND: continue with resisted walking, focused standing glute med activities.   Mliss Cummins, PT  04/07/24 8:49 AM

## 2024-04-07 ENCOUNTER — Ambulatory Visit: Admitting: Physical Therapy

## 2024-04-07 ENCOUNTER — Other Ambulatory Visit: Payer: Self-pay | Admitting: Family Medicine

## 2024-04-07 ENCOUNTER — Encounter: Payer: Self-pay | Admitting: Physical Therapy

## 2024-04-07 DIAGNOSIS — M6281 Muscle weakness (generalized): Secondary | ICD-10-CM

## 2024-04-07 DIAGNOSIS — R293 Abnormal posture: Secondary | ICD-10-CM

## 2024-04-07 DIAGNOSIS — R269 Unspecified abnormalities of gait and mobility: Secondary | ICD-10-CM

## 2024-04-07 DIAGNOSIS — F5101 Primary insomnia: Secondary | ICD-10-CM

## 2024-04-08 ENCOUNTER — Other Ambulatory Visit: Payer: Self-pay | Admitting: Family Medicine

## 2024-04-08 DIAGNOSIS — F411 Generalized anxiety disorder: Secondary | ICD-10-CM

## 2024-04-08 NOTE — Telephone Encounter (Signed)
 Patient scheduled on 05/20/24, thanks.

## 2024-04-08 NOTE — Telephone Encounter (Signed)
 Pls contact pt to schedule HTN and mood appt with Dr. Alvan. Past due since April. Sending 30 day med refill. Thx.

## 2024-04-09 ENCOUNTER — Encounter: Admitting: Physical Therapy

## 2024-04-09 ENCOUNTER — Ambulatory Visit: Admitting: Physical Therapy

## 2024-04-14 ENCOUNTER — Other Ambulatory Visit: Payer: Self-pay | Admitting: Family Medicine

## 2024-04-14 ENCOUNTER — Other Ambulatory Visit: Payer: Self-pay

## 2024-04-14 DIAGNOSIS — F5101 Primary insomnia: Secondary | ICD-10-CM

## 2024-04-14 MED ORDER — LEVOTHYROXINE SODIUM 88 MCG PO TABS: 88.0000 ug | ORAL_TABLET | Freq: Every day | ORAL | 0 refills | Status: DC

## 2024-04-14 MED ORDER — TRAZODONE HCL 50 MG PO TABS: 50.0000 mg | ORAL_TABLET | Freq: Every day | ORAL | 0 refills | Status: DC

## 2024-04-15 ENCOUNTER — Ambulatory Visit: Admitting: Sports Medicine

## 2024-04-16 ENCOUNTER — Encounter: Payer: Self-pay | Admitting: Physical Therapy

## 2024-04-16 ENCOUNTER — Ambulatory Visit: Admitting: Physical Therapy

## 2024-04-16 ENCOUNTER — Other Ambulatory Visit: Payer: Self-pay | Admitting: *Deleted

## 2024-04-16 DIAGNOSIS — R102 Pelvic and perineal pain: Secondary | ICD-10-CM

## 2024-04-16 DIAGNOSIS — M6281 Muscle weakness (generalized): Secondary | ICD-10-CM | POA: Diagnosis not present

## 2024-04-16 DIAGNOSIS — R279 Unspecified lack of coordination: Secondary | ICD-10-CM

## 2024-04-16 DIAGNOSIS — M62838 Other muscle spasm: Secondary | ICD-10-CM

## 2024-04-16 DIAGNOSIS — R293 Abnormal posture: Secondary | ICD-10-CM

## 2024-04-16 DIAGNOSIS — R269 Unspecified abnormalities of gait and mobility: Secondary | ICD-10-CM

## 2024-04-16 NOTE — Therapy (Signed)
 OUTPATIENT PHYSICAL THERAPY FEMALE PELVIC AND ORTHO TREATMENT   Patient Name: Anna Ramsey MRN: [id number] DOB:[birth year], 58 y.o., female Today's Date: 04/16/2024  END OF SESSION:  PT End of Session - 04/16/24 1252     Visit Number 20    Date for PT Re-Evaluation 07/17/24    Authorization Type [employer]    PT Start Time 1100    PT Stop Time 1145    PT Time Calculation (min) 45 min    Activity Tolerance Patient tolerated treatment well    Behavior During Therapy [facility] for tasks assessed/performed                        Past Medical History:  Diagnosis Date   ADHD (attention deficit hyperactivity disorder) 2024   Dysuria    Erythematous bladder mucosa    Heart murmur    asymptomatic per pt --  no echo   Hematuria    History of cervical cancer    12/ 2014  Stage IB2--  s/p  TAH w/ BSO and pelvic lymphadectomy/  and radiation therapy   History of radiation therapy 10/20/13-11/28/13   pelvis 50.4 gray   Hypertension    Lesion of bladder    Mild intermittent asthma    Neuromuscular disorder (HCC)    Seasonal allergies    Thyroid disease    Past Surgical History:  Procedure Laterality Date   CYSTOSCOPY WITH BIOPSY N/A 04/29/2015   Procedure: CYSTOSCOPY WITH BIOPSY WITH FULGERATION;  Surgeon: Belvie LITTIE Clara, MD;  Location: [facility];  Service: Urology;  Laterality: N/A;  1 HR [phone] [id number]   FOOT SURGERY Left 2010   RADICAL ABDOMINAL HYSTERECTOMY  09-05-2013   w/ BILATERAL SALPINGOOPHORECTOMY AND PELVIC LYMPHADECTOMY   Patient Active Problem List   Diagnosis Date Noted   SUI (stress urinary incontinence, female) 11/01/2023   History of urinary retention 11/01/2023   Neurogenic bladder 10/18/2023   Hematuria 10/18/2023   Acute urinary retention 10/18/2023   Preventive measure 12/20/2022   Stress and adjustment reaction 01/13/2021   Inattention 10/28/2019   Hypothyroid 11/13/2016   Neuropathy involving both lower  extremities 11/13/2016   Cystitis, radiation 05/22/2014   Cervical cancer (HCC) 09/01/2013   Adenocarcinoma of cervix, 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   Asthma, mild intermittent 07/07/2009   INSOMNIA 05/28/2008   Generalized anxiety disorder 05/07/2008   HYPERTENSION, BENIGN 05/31/2007    PCP: Geraline Dorothyann JONETTA, MD  REFERRING PROVIDER: Skeet Juliene SAUNDERS, DO   REFERRING DIAG:  [phone] (ICD-10-CM) - Bilateral leg weakness G62.89 (ICD-10-CM) - 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 I bought an AFO off [employer] and it is a Secretary/administrator. My walking is better.     Eval: Right leg worse than left but both painful. Right has instances of knee just giving out, has neuropathy - like weakness, pain since radiation in 2015. The nerves have been affected and greatly limiting her activity. Does have symptoms of numbness, sensory deficits, weakness, ataxic gait, and painful but all vary based on the day.     Fluid intake: Yes: water    PAIN:  Are you having pain? A lot of RTLE numbness, hips feel 'off. NPRS scale:0 Pain location: bil legs Right leg  Pain type: aching Pain description: constant   Aggravating factors: walking, end of day, rainy days Relieving factors: try not to walk, decreasing activities    PRECAUTIONS: Other: cervical cancer with radiation  RED  FLAGS: None   WEIGHT BEARING RESTRICTIONS: No  FALLS:  Has patient fallen in last 6 months? Yes. Number of falls 1x/weekly due to weakness and decreased balance from having the radiation, trouble with low light - reports she does need to furniture walk to help with balance  LIVING ENVIRONMENT: Lives with: lives with their family  OCCUPATION: sits with laptop on her lap for 12 hours per day; unable to sit with legs down due to them falling asleep  PLOF: Independent  PATIENT GOALS: to strengthening enough to walk across grass, wants to have better balance, stronger legs                                        PERTINENT HISTORY:  ADHD; Cervical Cancer 2014; 9 weeks of radiation; Radical hysterectomy;    OBJECTIVE:  Note: Objective measures were completed at Evaluation unless otherwise noted.  DIAGNOSTIC FINDINGS:   PATIENT SURVEYS:  Lower Extremity Functional Score: 5 / 80 = 6.3 %  COGNITION: Overall cognitive status: Within functional limits for tasks assessed     SENSATION: Poor sensation at Rt LE - light touch felt along posterior gastroc only no awareness without visualization of being touched at medial, lateral, anterior lower leg, reports numbness in bil feet Rt>Lt  EDEMA:  Rt ankle sometimes swells per pt  MUSCLE LENGTH: Bil hamstrings and adductors limited by 25%;  POSTURE: rounded shoulders and forward head  LOWER EXTREMITY ROM:  weakness and poor coordination/proprioception   LOWER EXTREMITY MMT:  02/04/24 hip flexion was R 3/5, L 4/5  MMT Right Eval */5 Left Eval */5 Right 5/16 Left 5/16 RIGHT 6/6 LEFT  6/6  Hip flexion 2 3 4+ 5 3   Hip extension 2 3 4+ 4 4+ 4  Hip abduction 3 3+ 5- 5 5   Hip adduction 1+ 2+ 2+ 2+ 2+ 2+  Hip internal rotation        Hip external rotation        Knee flexion 2 3 3- 4 3+ 5  Knee extension 2+ 3 3- 5 3+   Ankle dorsiflexion 0 4 0 4+ long sitting 2 4  4+ long sitting 2    Ankle plantarflexion 3 4      Ankle  inversion 1 4 3+ tested in long sitting 4+ tested in long sitting 3+ in long sitting 5 in long sitting  Ankle eversion 2 4 3+ tested in long sitting 4- tested in  long sitting 2+ 4- tested in long sitting   (Blank rows = not tested)   FUNCTIONAL TESTS:  Five times Sit to Stand Test (FTSS) Method: Use a straight back chair with a solid seat that is 16-18" high. Ask participant to sit on the chair with arms folded across their chest.   Instructions: "Stand up and sit down as quickly as possible 5 times, keeping your arms folded across your chest."   Measurement: Stop timing when the participant stands the 5th time.  TIME: ______ (in seconds)  Times > 13.6 seconds is associated with increased disability and morbidity (Guralnik, 2000) Times > 15 seconds is predictive of recurrent falls in healthy individuals aged 55 and older (Buatois, et al., 2008) Normal performance values in community dwelling individuals aged 52 and older (Bohannon, 2006): 50-59 years: 8 seconds 60-69 years: 11.4 seconds 70-79 years: 12.6 seconds 80-89 years: 14.8 seconds  MCID: >= 2.3 seconds for Vestibular Disorders (Meretta, 2006)  5 times sit to stand: 22s with hands - very unstable with poor hip, posture, core control  Timed up and go (TUG): 28s with SBQC - min A  GAIT: Distance walked: 200' Assistive device utilized: Quad cane small base Level of assistance: CGA Comments: Rt LE demonstrating ataxic pattern, Lt trunk lean intermittently  to right self, poor step height bil, decreased stride length, limited glute activation                                                                                                                                 TREATMENT DATE:  04/16/24: Pt arrives for aquatic physical therapy. Treatment took place in 3.5-5.5 feet of water . Water  temperature was 91 degrees F. Pt entered the pool via stairs with heavy use of railings, step to step: PTA SBA. Pt requires buoyancy of water   for support and to offload joints with strengthening exercises.  Seated water  bench with 75% submersion Pt performed seated LE AROM exercises 20x in all planes, concurrent discussion of current status. 75% depth water  walking with PTA CGA at pelvis. 4 lengths in each direction, pt requires large noodle for balance and weight added to RT ankle to increase proprioception. Pt kept ankle weight on for remainder of session. Standing slow marching 10x Bil, ham curls Bil 10x2 and hip abduction 10x2 holding onto wall. Standing single leg stance 3x 10 sec holding large noodle Bil.  Seated decompression with large noodle behind patient to rest low back 2 min 2x bouts. Prone float with large noodle for bil quad stretch, VC to flex knees.RT hip hile 2x10 with TC from PTA off first step. Supine float holding large noodle across chest then hip add/abd 10x, VC to use her abdominals more for cont  04/07/24 Nu step seat 4,UE 9, Lev 5 x 6 min Glute med ball press with large beach ball at barre and inside foot on 4 inch step Single leg taps 6 inch challenging so went  to 4 inch taps B fwd and lateral focusing on glute med engagement and core stab to avoid lumbar extension Gait: adjusted cane to L UE and correct height used mirror for VC Hip hike off 2 inch step - challenging to raise hip so did supine for form then re-tried Pallof press yellow at barre and then rotations with straight arms   03/26/24; Pt arrives for aquatic physical therapy. Treatment took place in 3.5-5.5 feet of water . Water  temperature was. Pt entered the pool via stairs with heavy use of railings, step to step: PTA SBA. Pt requires buoyancy of water  for support and to offload joints with strengthening exercises.  Seated water  bench with 75% submersion Pt performed seated LE AROM exercises 20x in all planes, concurrent discussion of current status. 75% depth water  walking with PTA CGA at pelvis. 8 lengths in each direction, pt requires small noodle for  balance. Standing slow marching 10x Bil, ham curls Bil 10x and hip abduction 10x holding onto wall. Standing single leg stance 3x 10 sec holding large noodle Bil.  Seated decompression with large noodle behind patient to rest low back 2 min 2x bouts. Prone float with large noodle for bil quad stretch, VC to flex knees. Supine float holding large noodle across chest then hip add/abd 10x, VC to use her abdominals more for control.  03/24/2024 Nu step seat 4,UE 9, Lev 5 x 6 min Glute med ball press with small beach ball and inside foot on 4 inch step Single leg taps 6 inch challenging so went to 4 inch taps B fwd and lateral focusing on glute med engagement and core stab to avoid lumbar extension Gait: adjusted cane to L UE and correct height used mirror for VC Discussed Foot-up brace to assist with DF  PATIENT EDUCATION:  Education details: [email or web address] - uses App Person educated: Patient Education method: Explanation, Demonstration, Tactile cues, Verbal cues, and Handouts Education comprehension: verbalized understanding, returned demonstration, verbal cues required, tactile cues required, and needs further education  HOME EXERCISE PROGRAM: Access Code: [email or web address] URL: [email or web address] Date: 04/07/2024 Prepared by: Mliss  Exercises - Supine Heel Slide with Strap  - 1 x daily - 7 x weekly - 3 sets - 10 reps - Pillow squeezes with kegel  - 1 x daily - 7 x weekly - 3 sets - 10 reps - Sit to Stand with Counter Support  - 1 x daily - 7 x weekly - 1 sets - 10 reps - Seated Long Arc Quad  - 1 x daily - 7 x weekly - 1-3 sets - 10 reps - 5 sec hold - Seated Isometric Hip Abduction with Resistance  - 1 x daily - 7 x weekly - 1-3 sets - 10 reps - Heel Raises with Counter Support  - 1 x daily - 7 x weekly - 1-3 sets - 10 reps - Standing Terminal Knee Extension at Wall with Ball  - 2 x daily - 7 x weekly - 2 sets - 10 reps - 5 sec hold - Squat with Chair Touch and Resistance Loop  - 1 x  daily - 3 x weekly - 2 sets - 10 reps - Standing Knee Flexion with Counter Support  - 1 x daily - 3 x weekly - 2 sets - 10 reps - Supine Bridge with Resistance Band  - 1 x daily - 7 x weekly - 1-2 sets - 10 reps - 5 sec hold - Standing Hip Abduction on Slider  - 1 x daily -  3 x weekly - 2 sets - 10 reps - Calf stretch on rocker board  - 1 x daily - 7 x weekly - 1 sets - 3 reps - 30 sec  hold - Standing Anti-Rotation Press with Anchored Resistance  - 1 x daily - 3 x weekly - 2 sets - 10 reps - Standing Trunk Rotation with Resistance  - 1 x daily - 3 x weekly - 2 sets - 10 reps  ASSESSMENT:  CLINICAL IMPRESSION: Pt purchased an OTC AFO that has improved her gait significantly. Single leg stance in water  significantly steadier today. Pt required TC for RT hip hike. No pain with exercises. Putting weight on pt's ankle helped increase proprioception to RT ankle.      Eval: Patient is a 58 y.o. female  who was seen today for physical therapy evaluation and treatment for poor gait mechanics, impaired posture, impaired balance, decreased hip and core strength, increased fall risk. Pt has complex history of  Radiation-induced lumbosacral plexopathy and Polyradiculoneuropathy status post cervical cancer. Pt has completed pelvic floor PT recently and had positive outcomes with this and resolution of symptoms. Pt very motivated to participate in PT for bil LE strengthening and decreased fall risk. Pt demonstrated impairments with objective measurements of TUG and 5xSTS indicative of increased fall risk and balance deficits. Pt reports she is unable to hold and carry grandchildren, to walk on uneven surfaces, needs assistance with dressing, walking, transfers sometimes and wants to be more I. Pt would benefit from additional PT to further address deficits.    OBJECTIVE IMPAIRMENTS: Abnormal gait, decreased activity tolerance, decreased coordination, decreased endurance, decreased mobility, difficulty walking,  decreased strength, increased fascial restrictions, increased muscle spasms, impaired flexibility, impaired sensation, improper body mechanics, postural dysfunction, and pain.   ACTIVITY LIMITATIONS: carrying, lifting, bending, sitting, standing, squatting, stairs, transfers, bed mobility, locomotion level, and caring for others  PARTICIPATION LIMITATIONS: meal prep, cleaning, laundry, interpersonal relationship, driving, shopping, community activity, occupation, and yard work  PERSONAL FACTORS: Fitness, Past/current experiences, Time since onset of injury/illness/exacerbation, and 1 comorbidity: medical history are also affecting patient's functional outcome.   REHAB POTENTIAL: Good  CLINICAL DECISION MAKING: Evolving/moderate complexity  EVALUATION COMPLEXITY: Moderate   GOALS: Goals reviewed with patient? Yes  SHORT TERM GOALS: Target date: 02/13/24 Pt to be I with HEP for carry over and continuing recommendations for improved outcomes.   Baseline: Goal status: MET  2.  Pt to demonstrate at least 3/5 Rt LE strength grossly and 4/5 Lt for improved pelvic stability and functional squats without falling.  Baseline:  Goal status: IPARTIALLY MET 03/03/24  3.  Pt to demonstrate ability to complete static standing without AD for at least 5 mins at midline to complete gentle kitchen tasks/household tasks without fear of falling.  Baseline:  Goal status: MET  4.  Pt to demonstrate I with dynamic sitting balance to better be able to reach floor level for retrieving fallen objects and putting on socks without falling Baseline:  Goal status: MET 03/03/24  LONG TERM GOALS: Target date: 07/17/24  Pt to be I with advanced HEP for carry over and continuing recommendations for improved outcomes.   Baseline:  Goal status: INITIAL  2.  Pt to report improved LEFS score to at least 30/80 for improved I with functional tasks at home such as dressing, walking from room to room.  Baseline:  Goal  status: INITIAL  3.  Pt to demonstrate improved dynamic standing balance with or without AD at  no more than CGA to better be able to play with grandchildren without falling.  Baseline:  Goal status: INITIAL  4.  Pt to demonstrate improved gait mechanics with LRAD for 150' with minimal compensate strategies to I with pt safety with ambulating into a store.  Baseline:  Goal status: INITIAL  5.  Pt to demonstrate ability to perform x5 Sit to stand with at least 10# to more safely assist in caring for grandchildren without risk of falling.  Baseline:  Goal status: INITIAL    PLAN:  PT FREQUENCY: 2x/week  PT DURATION: 12 weeks  PLANNED INTERVENTIONS:    97110-Therapeutic exercises, 97530- Therapeutic activity, [id number]- Neuromuscular re-education, 97535- Self Care, [id number]- Manual therapy, [id number]- Gait training, [phone]- Canalith repositioning, [id number]- Aquatic Therapy, [phone]- Splinting, [id number]- Electrical stimulation (manual), [id number]- Vasopneumatic device, [id number]- Ultrasound, [id number]- Traction (mechanical), [id number]- Ionotophoresis 4mg /ml Dexamethasone , Patient/Family education, Balance training, Stair training, Taping, Dry Needling, Joint mobilization, Joint manipulation, Spinal manipulation, Spinal mobilization, Scar mobilization, DME instructions, Wheelchair mobility training, Cryotherapy, Moist heat, and Biofeedback  NEXT VISIT: aquatic PT, core and LE strengthening and neuromuscular re-ed  LAND: continue with resisted walking, focused standing glute med activities.   Delon Darner, PTA 04/16/24 8:42 PM

## 2024-04-18 ENCOUNTER — Other Ambulatory Visit: Payer: Self-pay | Admitting: Obstetrics

## 2024-04-18 DIAGNOSIS — Z87898 Personal history of other specified conditions: Secondary | ICD-10-CM

## 2024-04-18 DIAGNOSIS — N319 Neuromuscular dysfunction of bladder, unspecified: Secondary | ICD-10-CM

## 2024-04-20 ENCOUNTER — Encounter: Payer: Self-pay | Admitting: Family Medicine

## 2024-04-21 ENCOUNTER — Ambulatory Visit (INDEPENDENT_AMBULATORY_CARE_PROVIDER_SITE_OTHER): Admitting: Family Medicine

## 2024-04-21 VITALS — BP 135/70 | HR 63 | Ht 61.0 in | Wt 116.2 lb

## 2024-04-21 DIAGNOSIS — F411 Generalized anxiety disorder: Secondary | ICD-10-CM | POA: Diagnosis not present

## 2024-04-21 DIAGNOSIS — F5101 Primary insomnia: Secondary | ICD-10-CM

## 2024-04-21 DIAGNOSIS — F988 Other specified behavioral and emotional disorders with onset usually occurring in childhood and adolescence: Secondary | ICD-10-CM | POA: Insufficient documentation

## 2024-04-21 DIAGNOSIS — F900 Attention-deficit hyperactivity disorder, predominantly inattentive type: Secondary | ICD-10-CM

## 2024-04-21 DIAGNOSIS — E038 Other specified hypothyroidism: Secondary | ICD-10-CM

## 2024-04-21 DIAGNOSIS — I10 Essential (primary) hypertension: Secondary | ICD-10-CM | POA: Diagnosis not present

## 2024-04-21 MED ORDER — LEVOTHYROXINE SODIUM 88 MCG PO TABS: 88.0000 ug | ORAL_TABLET | Freq: Every day | ORAL | 0 refills | Status: DC

## 2024-04-21 MED ORDER — TRAZODONE HCL 50 MG PO TABS: 50.0000 mg | ORAL_TABLET | Freq: Every day | ORAL | 1 refills | Status: AC

## 2024-04-21 MED ORDER — AMLODIPINE BESYLATE 5 MG PO TABS: 5.0000 mg | ORAL_TABLET | Freq: Every day | ORAL | 1 refills | Status: DC

## 2024-04-21 NOTE — Assessment & Plan Note (Signed)
 Now on  Vyvanse, doing well with it so far. ON intuniv as well.

## 2024-04-21 NOTE — Progress Notes (Signed)
 Established Patient Office Visit  Subjective  Patient ID: Anna Ramsey, female    DOB: [birth year]  Age: 58 y.o. MRN: [id number]  Chief Complaint  Patient presents with   medication  check    Patient wanting to go over log sent to provider previously.  Patient stated Concerta was stopped and she was started on Vyvanse at starter dose.     HPI  Hypertension- Pt denies chest pain, SOB, dizziness, or heart palpitations.  Taking meds as directed w/o problems.  Denies medication side effects.  Patient reports home blood pressures are well-controlled.  She did bring in a pain log today. ADD meds were recently changed she switched now from Concerta to Vyvanse.  She thinks is a 20 mg dose and says that so far she feels like she is doing well with that they also added wife and seen 1 mg which she has been taking in the mornings.  She would really like to look at whether or not she might be to come off of the Wellbutrin  and if needed adjust the Paxil .  She is here with her husband today.     ROS    Objective:     BP 135/70   Pulse 63   Ht 5' 1 (1.549 m)   Wt 116 lb 4 oz (52.7 kg)   LMP 08/19/2013   SpO2 96%   BMI 21.97 kg/m    Physical Exam Vitals and nursing note reviewed.  Constitutional:      Appearance: Normal appearance.  HENT:     Head: Normocephalic and atraumatic.  Eyes:     Conjunctiva/sclera: Conjunctivae normal.  Cardiovascular:     Rate and Rhythm: Normal rate and regular rhythm.  Pulmonary:     Effort: Pulmonary effort is normal.     Breath sounds: Normal breath sounds.  Skin:    General: Skin is warm and dry.  Neurological:     Mental Status: She is alert.  Psychiatric:        Mood and Affect: Mood normal.      No results found for any visits on 04/21/24.    The 10-year ASCVD risk score (Arnett DK, et al., 2019) is: 2.9%    Assessment & Plan:   Problem List Items Addressed This Visit       Cardiovascular and Mediastinum    HYPERTENSION, BENIGN - Primary   Well controlled. Continue current regimen. Follow up in  6 months.       Relevant Medications   amLODipine  (NORVASC ) 5 MG tablet     Endocrine   Hypothyroid   Due to recheck TSH.  Will go ahead and refill levothyroxine  as well.      Relevant Medications   levothyroxine  (SYNTHROID ) 88 MCG tablet   Other Relevant Orders   TSH     Other   INSOMNIA   Trazodone  for sleep.  She still struggles with sleep but is doing okay.      Relevant Medications   traZODone  (DESYREL ) 50 MG tablet   Generalized anxiety disorder   In regards to her anxiety were going to taper off of the Wellbutrin  and give her a few weeks without it to see if we can get rid of it basically.  I just want a make sure that she is still doing well and does not want to add it back we do have some room to increase the paroxetine  if needed.  Does tend to have some component of seasonal affective disorder  so it may be that we just might need to make a temporary adjustment of the wintertime and then be able to come back down periodically      Relevant Medications   traZODone  (DESYREL ) 50 MG tablet   ADD (attention deficit disorder)   Now on  Vyvanse, doing well with it so far. ON intuniv as well.        Return in about 4 months (around 08/22/2024) for Hypertension and Mood .    Dorothyann Byars, MD

## 2024-04-21 NOTE — Assessment & Plan Note (Signed)
 In regards to her anxiety were going to taper off of the Wellbutrin  and give her a few weeks without it to see if we can get rid of it basically.  I just want a make sure that she is still doing well and does not want to add it back we do have some room to increase the paroxetine  if needed.  Does tend to have some component of seasonal affective disorder so it may be that we just might need to make a temporary adjustment of the wintertime and then be able to come back down periodically

## 2024-04-21 NOTE — Assessment & Plan Note (Signed)
 Due to recheck TSH.  Will go ahead and refill levothyroxine  as well.

## 2024-04-21 NOTE — Assessment & Plan Note (Signed)
 Well controlled. Continue current regimen. Follow up in  6 months.

## 2024-04-21 NOTE — Assessment & Plan Note (Signed)
 Trazodone  for sleep.  She still struggles with sleep but is doing okay.

## 2024-04-21 NOTE — Patient Instructions (Signed)
 Ok to decrease  bupropion  to one tab every other day for 8 days and then stop.  Let me know via MyChart in a few weeks. if we need to bump your paroxetine .

## 2024-04-21 NOTE — Telephone Encounter (Signed)
 Please see mychart message sent by pt and advise.

## 2024-04-23 ENCOUNTER — Encounter: Payer: Self-pay | Admitting: Physical Therapy

## 2024-04-23 ENCOUNTER — Ambulatory Visit: Admitting: Physical Therapy

## 2024-04-23 DIAGNOSIS — R293 Abnormal posture: Secondary | ICD-10-CM

## 2024-04-23 DIAGNOSIS — M6281 Muscle weakness (generalized): Secondary | ICD-10-CM | POA: Diagnosis not present

## 2024-04-23 DIAGNOSIS — R269 Unspecified abnormalities of gait and mobility: Secondary | ICD-10-CM

## 2024-04-23 NOTE — Therapy (Signed)
 OUTPATIENT PHYSICAL THERAPY FEMALE PELVIC AND ORTHO TREATMENT   Patient Name: Anna Ramsey MRN: [id number] DOB:[birth year], 58 y.o., female Today's Date: 04/23/2024  END OF SESSION:  PT End of Session - 04/23/24 0800     Visit Number 21    Date for PT Re-Evaluation 07/17/24    Authorization Type [employer]    PT Start Time 0800    PT Stop Time 0845    PT Time Calculation (min) 45 min    Activity Tolerance Patient tolerated treatment well    Behavior During Therapy [facility] for tasks assessed/performed                         Past Medical History:  Diagnosis Date   ADHD (attention deficit hyperactivity disorder) 2024   Dysuria    Erythematous bladder mucosa    Heart murmur    asymptomatic per pt --  no echo   Hematuria    History of cervical cancer    12/ 2014  Stage IB2--  s/p  TAH w/ BSO and pelvic lymphadectomy/  and radiation therapy   History of radiation therapy 10/20/13-11/28/13   pelvis 50.4 gray   Hypertension    Lesion of bladder    Mild intermittent asthma    Neuromuscular disorder (HCC)    Seasonal allergies    Thyroid disease    Past Surgical History:  Procedure Laterality Date   CYSTOSCOPY WITH BIOPSY N/A 04/29/2015   Procedure: CYSTOSCOPY WITH BIOPSY WITH FULGERATION;  Surgeon: Belvie LITTIE Clara, MD;  Location: [facility];  Service: Urology;  Laterality: N/A;  1 HR [phone] [id number]   FOOT SURGERY Left 2010   RADICAL ABDOMINAL HYSTERECTOMY  09-05-2013   w/ BILATERAL SALPINGOOPHORECTOMY AND PELVIC LYMPHADECTOMY   Patient Active Problem List   Diagnosis Date Noted   ADD (attention deficit disorder) 04/21/2024   SUI (stress urinary incontinence, female) 11/01/2023   History of urinary retention 11/01/2023   Neurogenic bladder 10/18/2023   Hematuria 10/18/2023   Acute urinary retention 10/18/2023   Stress and adjustment reaction 01/13/2021   Inattention 10/28/2019   Hypothyroid 11/13/2016   Neuropathy  involving both lower extremities 11/13/2016   Cystitis, radiation 05/22/2014   Cervical cancer (HCC) 09/01/2013   Adenocarcinoma of cervix, 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   Asthma, mild intermittent 07/07/2009   INSOMNIA 05/28/2008   Generalized anxiety disorder 05/07/2008   HYPERTENSION, BENIGN 05/31/2007    PCP: Geraline Dorothyann JONETTA, MD  REFERRING PROVIDER: Skeet Juliene SAUNDERS, DO   REFERRING DIAG:  [phone] (ICD-10-CM) - Bilateral leg weakness G62.89 (ICD-10-CM) - 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  I am getting nerves and muscles to fire more than in the past.     Eval: Right leg worse than left but both painful. Right has instances of knee just giving out, has neuropathy - like weakness, pain since radiation in 2015. The nerves have been affected and greatly limiting her activity. Does have symptoms of numbness, sensory deficits, weakness, ataxic gait, and painful but all vary based on the day.     Fluid intake: Yes: water    PAIN:  Are you having pain? A lot of RTLE numbness, hips feel 'off. NPRS scale:0 Pain location: bil legs Right leg  Pain type: aching Pain description: constant   Aggravating factors: walking, end of day, rainy days Relieving factors: try not to walk, decreasing activities    PRECAUTIONS: Other: cervical cancer with radiation  RED FLAGS: None   WEIGHT BEARING  RESTRICTIONS: No  FALLS:  Has patient fallen in last 6 months? Yes. Number of falls 1x/weekly due to weakness and decreased balance from having the radiation, trouble with low light - reports she does need to furniture walk to help with balance  LIVING ENVIRONMENT: Lives with: lives with their family  OCCUPATION: sits with laptop on her lap for 12 hours per day; unable to sit with legs down due to them falling asleep  PLOF: Independent  PATIENT GOALS: to strengthening enough to walk across grass, wants to have better balance, stronger legs                                        PERTINENT HISTORY:  ADHD; Cervical Cancer 2014; 9 weeks of radiation; Radical hysterectomy;    OBJECTIVE:  Note: Objective measures were completed at Evaluation unless otherwise noted.  DIAGNOSTIC FINDINGS:   PATIENT SURVEYS:  Lower Extremity Functional Score: 5 / 80 = 6.3 %  COGNITION: Overall cognitive status: Within functional limits for tasks assessed     SENSATION: Poor sensation at Rt LE - light touch felt along posterior gastroc only no awareness without visualization of being touched at medial, lateral, anterior lower leg, reports numbness in bil feet Rt>Lt  EDEMA:  Rt ankle sometimes swells per pt  MUSCLE LENGTH: Bil hamstrings and adductors limited by 25%;  POSTURE: rounded shoulders and forward head  LOWER EXTREMITY ROM:  weakness and poor coordination/proprioception   LOWER EXTREMITY MMT:  02/04/24 hip flexion was R 3/5, L 4/5  MMT Right Eval */5 Left Eval */5 Right 5/16 Left 5/16 RIGHT 6/6 LEFT  6/6  Hip flexion 2 3 4+ 5 3   Hip extension 2 3 4+ 4 4+ 4  Hip abduction 3 3+ 5- 5 5   Hip adduction 1+ 2+ 2+ 2+ 2+ 2+  Hip internal rotation        Hip external rotation        Knee flexion 2 3 3- 4 3+ 5  Knee extension 2+ 3 3- 5 3+   Ankle dorsiflexion 0 4 0 4+ long sitting 2 4  4+ long sitting 2    Ankle plantarflexion 3 4      Ankle inversion 1 4 3+ tested in long  sitting 4+ tested in long sitting 3+ in long sitting 5 in long sitting  Ankle eversion 2 4 3+ tested in long  sitting 4- tested in long sitting 2+ 4- tested in long sitting   (Blank rows = not tested)   FUNCTIONAL TESTS:  Five times Sit to Stand Test (FTSS) Method: Use a straight back chair with a solid seat that is 16-18" high. Ask participant to sit on the chair with arms folded across their chest.   Instructions: "Stand up and sit down as quickly as possible 5 times, keeping your arms folded across your chest."   Measurement: Stop timing when the participant stands the 5th time.  TIME: ______ (in seconds)  Times > 13.6 seconds is associated with increased disability and morbidity (Guralnik, 2000) Times > 15 seconds is predictive of recurrent falls in healthy individuals aged 50 and older (Buatois, et al., 2008) Normal performance values in community dwelling individuals aged 74 and older (Bohannon, 2006): 50-59 years: 8 seconds 60-69 years: 11.4 seconds 70-79 years: 12.6 seconds 80-89 years: 14.8 seconds  MCID: >= 2.3 seconds for Vestibular Disorders (Meretta, 2006)  5 times sit to stand: 22s with hands - very unstable with poor hip, posture, core control  Timed up and go (TUG): 28s with SBQC - min A  GAIT: Distance walked: 200' Assistive device utilized: Quad cane small base Level of assistance: CGA Comments: Rt LE demonstrating ataxic pattern, Lt trunk lean intermittently  to right self, poor step height bil, decreased stride length, limited glute activation                                                                                                                                 TREATMENT DATE:  04/23/24: Nu step seat 4,UE 9, Lev 5 x 6 min Hip hiking bil x 10 on 2 step, hip hiking bil x10 on 4 step with rail - Pt lacks eccentric control on Rt LE and hip and knee snap back into flexion after step up March taps to 4 step to fatigue on each LE 2 rounds each (8-10  reps) - PT cued TA indraw to avoid lumbar sway back to keep hip in neutral position on stance leg TKE yellow band Rt LE slow-motion x 8 reps Discussion of standing PF and TA without change in posture - alignment cues of sternum over pubic bone Resisted backward walking holding UE cable pulleys 5lb 3 rounds with close supervision/min A for LOB, focus on not leaning backwards and using core SL adduction press into small ball x10 each LE  04/16/24: Pt arrives for aquatic physical therapy. Treatment took place in 3.5-5.5 feet of water . Water  temperature was 91 degrees F. Pt entered the pool via stairs with heavy use of railings, step to step: PTA SBA. Pt requires buoyancy of water  for support and to offload joints with strengthening exercises.  Seated water  bench with 75% submersion Pt performed seated LE AROM exercises 20x in all planes, concurrent discussion of current status. 75% depth water  walking with PTA CGA at pelvis. 4 lengths in each direction,  pt requires large noodle for balance and weight added to RT ankle to increase proprioception. Pt kept ankle weight on for remainder of session. Standing slow marching 10x Bil, ham curls Bil 10x2 and hip abduction 10x2 holding onto wall. Standing single leg stance 3x 10 sec holding large noodle Bil.  Seated decompression with large noodle behind patient to rest low back 2 min 2x bouts. Prone float with large noodle for bil quad stretch, VC to flex knees.RT hip hile 2x10 with TC from PTA off first step. Supine float holding large noodle across chest then hip add/abd 10x, VC to use her abdominals more for cont  04/07/24 Nu step seat 4,UE 9, Lev 5 x 6 min Glute med ball press with large beach ball at barre and inside foot on 4 inch step Single leg taps 6 inch challenging so went to 4 inch taps B fwd and lateral focusing on glute med engagement and core stab to avoid lumbar extension Gait: adjusted cane to L UE and correct height used mirror for VC Hip hike off  2 inch step - challenging to raise hip so did supine for form then re-tried Pallof press yellow at barre and then rotations with straight arms   03/26/24; Pt arrives for aquatic physical therapy. Treatment took place in 3.5-5.5 feet of water . Water  temperature was. Pt entered the pool via stairs with heavy use of railings, step to step: PTA SBA. Pt requires buoyancy of water  for support and to offload joints with strengthening exercises.  Seated water  bench with 75% submersion Pt performed seated LE AROM exercises 20x in all planes, concurrent discussion of current status. 75% depth water  walking with PTA CGA at pelvis. 8 lengths in each direction, pt requires small noodle for balance. Standing slow marching 10x Bil, ham curls Bil 10x and hip abduction 10x holding onto wall. Standing single leg stance 3x 10 sec holding large noodle Bil.  Seated decompression with large noodle behind patient to rest low back 2 min 2x bouts. Prone float with large noodle for bil quad stretch, VC to flex knees. Supine float holding large noodle across chest then hip add/abd 10x, VC to use her abdominals more for control.   PATIENT EDUCATION:  Education details: [email or web address] - uses App Person educated: Patient Education method: Explanation, Demonstration, Tactile cues, Verbal cues, and Handouts Education comprehension: verbalized understanding, returned demonstration, verbal cues required, tactile cues required, and needs further education  HOME EXERCISE PROGRAM: Access Code: [email or web address] URL: [email or web address] Date: 04/07/2024 Prepared by: Mliss  Exercises - Supine Heel Slide with Strap  - 1 x daily - 7 x weekly - 3 sets - 10 reps - Pillow squeezes with kegel  - 1 x daily - 7 x weekly - 3 sets - 10 reps - Sit to Stand with Counter Support  - 1 x daily - 7 x weekly - 1 sets - 10 reps - Seated Long Arc Quad  - 1 x daily - 7 x weekly - 1-3 sets - 10 reps - 5 sec hold - Seated Isometric Hip Abduction with  Resistance  - 1 x daily - 7 x weekly - 1-3 sets - 10 reps - Heel Raises with Counter Support  - 1 x daily - 7 x weekly - 1-3 sets - 10 reps - Standing Terminal Knee Extension at Wall with Ball  - 2 x daily - 7 x weekly - 2 sets - 10 reps - 5 sec hold - Squat with Chair Touch and Resistance Loop  -  1 x daily - 3 x weekly - 2 sets - 10 reps - Standing Knee Flexion with Counter Support  - 1 x daily - 3 x weekly - 2 sets - 10 reps - Supine Bridge with Resistance Band  - 1 x daily - 7 x weekly - 1-2 sets - 10 reps - 5 sec hold - Standing Hip Abduction on Slider  - 1 x daily - 3 x weekly - 2 sets - 10 reps - Calf stretch on rocker board  - 1 x daily - 7 x weekly - 1 sets - 3 reps - 30 sec  hold - Standing Anti-Rotation Press with Anchored Resistance  - 1 x daily - 3 x weekly - 2 sets - 10 reps - Standing Trunk Rotation with Resistance  - 1 x daily - 3 x weekly - 2 sets - 10 reps  ASSESSMENT:  CLINICAL IMPRESSION:  Pt feels like this round of PT has given her the most progress she has ever made in a bout of PT.  She feels like her muscles and nerves are communicating better and she feels more connected and alive and awake in her legs with each session.  Today we focused on glut med and quad control of Rt hip and knee in closed chain and a lot of cues were used to keep hip, pelvis and lumbar spine in neutral alignment.  She felt using the UE cables with backwards walking challenged her control focus vs strength focus when using the belt and that both are good for her.     Eval: Patient is a 58 y.o. female  who was seen today for physical therapy evaluation and treatment for poor gait mechanics, impaired posture, impaired balance, decreased hip and core strength, increased fall risk. Pt has complex history of  Radiation-induced lumbosacral plexopathy and Polyradiculoneuropathy status post cervical cancer. Pt has completed pelvic floor PT recently and had positive outcomes with this and resolution of  symptoms. Pt very motivated to participate in PT for bil LE strengthening and decreased fall risk. Pt demonstrated impairments with objective measurements of TUG and 5xSTS indicative of increased fall risk and balance deficits. Pt reports she is unable to hold and carry grandchildren, to walk on uneven surfaces, needs assistance with dressing, walking, transfers sometimes and wants to be more I. Pt would benefit from additional PT to further address deficits.    OBJECTIVE IMPAIRMENTS: Abnormal gait, decreased activity tolerance, decreased coordination, decreased endurance, decreased mobility, difficulty walking, decreased strength, increased fascial restrictions, increased muscle spasms, impaired flexibility, impaired sensation, improper body mechanics, postural dysfunction, and pain.   ACTIVITY LIMITATIONS: carrying, lifting, bending, sitting, standing, squatting, stairs, transfers, bed mobility, locomotion level, and caring for others  PARTICIPATION LIMITATIONS: meal prep, cleaning, laundry, interpersonal relationship, driving, shopping, community activity, occupation, and yard work  PERSONAL FACTORS: Fitness, Past/current experiences, Time since onset of injury/illness/exacerbation, and 1 comorbidity: medical history are also affecting patient's functional outcome.   REHAB POTENTIAL: Good  CLINICAL DECISION MAKING: Evolving/moderate complexity  EVALUATION COMPLEXITY: Moderate   GOALS: Goals reviewed with patient? Yes  SHORT TERM GOALS: Target date: 02/13/24 Pt to be I with HEP for carry over and continuing recommendations for improved outcomes.   Baseline: Goal status: MET  2.  Pt to demonstrate at least 3/5 Rt LE strength grossly and 4/5 Lt for improved pelvic stability and functional squats without falling.  Baseline:  Goal status: IPARTIALLY MET 03/03/24  3.  Pt to demonstrate ability to complete static standing  without AD for at least 5 mins at midline to complete gentle kitchen  tasks/household tasks without fear of falling.  Baseline:  Goal status: MET  4.  Pt to demonstrate I with dynamic sitting balance to better be able to reach floor level for retrieving fallen objects and putting on socks without falling Baseline:  Goal status: MET 03/03/24  LONG TERM GOALS: Target date: 07/17/24  Pt to be I with advanced HEP for carry over and continuing recommendations for improved outcomes.   Baseline:  Goal status: INITIAL  2.  Pt to report improved LEFS score to at least 30/80 for improved I with functional tasks at home such as dressing, walking from room to room.  Baseline:  Goal status: INITIAL  3.  Pt to demonstrate improved dynamic standing balance with or without AD at no more than CGA to better be able to play with grandchildren without falling.  Baseline:  Goal status: INITIAL  4.  Pt to demonstrate improved gait mechanics with LRAD for 150' with minimal compensate strategies to I with pt safety with ambulating into a store.  Baseline:  Goal status: INITIAL  5.  Pt to demonstrate ability to perform x5 Sit to stand with at least 10# to more safely assist in caring for grandchildren without risk of falling.  Baseline:  Goal status: INITIAL    PLAN:  PT FREQUENCY: 2x/week  PT DURATION: 12 weeks  PLANNED INTERVENTIONS:    97110-Therapeutic exercises, 97530- Therapeutic activity, [id number]- Neuromuscular re-education, 97535- Self Care, [id number]- Manual therapy, [id number]- Gait training, [phone]- Canalith repositioning, [id number]- Aquatic Therapy, [phone]- Splinting, [id number]- Electrical stimulation (manual), [id number]- Vasopneumatic device, [id number]- Ultrasound, [id number]- Traction (mechanical), [id number]- Ionotophoresis 4mg /ml Dexamethasone , Patient/Family education, Balance training, Stair training, Taping, Dry Needling, Joint mobilization, Joint manipulation, Spinal manipulation, Spinal mobilization, Scar mobilization, DME instructions, Wheelchair mobility training, Cryotherapy, Moist  heat, and Biofeedback  NEXT VISIT: aquatic PT, core and LE strengthening and neuromuscular re-ed  LAND: continue with resisted walking, focused standing glute med activities.   Milania Haubner, PT 04/23/24 10:15 AM

## 2024-04-27 ENCOUNTER — Other Ambulatory Visit: Payer: Self-pay | Admitting: Family Medicine

## 2024-04-27 DIAGNOSIS — G5793 Unspecified mononeuropathy of bilateral lower limbs: Secondary | ICD-10-CM

## 2024-04-29 ENCOUNTER — Ambulatory Visit: Attending: Neurology | Admitting: Physical Therapy

## 2024-04-29 ENCOUNTER — Encounter: Payer: Self-pay | Admitting: Physical Therapy

## 2024-04-29 DIAGNOSIS — M62838 Other muscle spasm: Secondary | ICD-10-CM | POA: Insufficient documentation

## 2024-04-29 DIAGNOSIS — R102 Pelvic and perineal pain: Secondary | ICD-10-CM | POA: Insufficient documentation

## 2024-04-29 DIAGNOSIS — R269 Unspecified abnormalities of gait and mobility: Secondary | ICD-10-CM | POA: Insufficient documentation

## 2024-04-29 DIAGNOSIS — R279 Unspecified lack of coordination: Secondary | ICD-10-CM | POA: Insufficient documentation

## 2024-04-29 DIAGNOSIS — R293 Abnormal posture: Secondary | ICD-10-CM | POA: Insufficient documentation

## 2024-04-29 DIAGNOSIS — M6281 Muscle weakness (generalized): Secondary | ICD-10-CM | POA: Insufficient documentation

## 2024-04-29 NOTE — Therapy (Signed)
 OUTPATIENT PHYSICAL THERAPY FEMALE PELVIC AND ORTHO TREATMENT   Patient Name: Anna Ramsey MRN: [id number] DOB:1965/10/27, 58 y.o., female Today's Date: 04/29/2024  END OF SESSION:  PT End of Session - 04/29/24 0758     Visit Number 22    Date for PT Re-Evaluation 07/17/24    Authorization Type [employer]    PT Start Time 0759    PT Stop Time 0845    PT Time Calculation (min) 46 min    Activity Tolerance Patient tolerated treatment well    Behavior During Therapy [facility] for tasks assessed/performed                          Past Medical History:  Diagnosis Date   ADHD (attention deficit hyperactivity disorder) 2024   Dysuria    Erythematous bladder mucosa    Heart murmur    asymptomatic per pt --  no echo   Hematuria    History of cervical cancer    12/ 2014  Stage IB2--  s/p  TAH w/ BSO and pelvic lymphadectomy/  and radiation therapy   History of radiation therapy 10/20/13-11/28/13   pelvis 50.4 gray   Hypertension    Lesion of bladder    Mild intermittent asthma    Neuromuscular disorder (HCC)    Seasonal allergies    Thyroid disease    Past Surgical History:  Procedure Laterality Date   CYSTOSCOPY WITH BIOPSY N/A 04/29/2015   Procedure: CYSTOSCOPY WITH BIOPSY WITH FULGERATION;  Surgeon: Belvie LITTIE Clara, MD;  Location: [facility];  Service: Urology;  Laterality: N/A;  1 HR [phone] [id number]   FOOT SURGERY Left 2010   RADICAL ABDOMINAL HYSTERECTOMY  09-05-2013   w/ BILATERAL SALPINGOOPHORECTOMY AND PELVIC LYMPHADECTOMY   Patient Active Problem List   Diagnosis Date Noted   ADD (attention deficit disorder) 04/21/2024   SUI (stress urinary incontinence, female) 11/01/2023   History of urinary retention 11/01/2023   Neurogenic bladder 10/18/2023   Hematuria 10/18/2023   Acute urinary retention 10/18/2023   Stress and adjustment reaction 01/13/2021   Inattention 10/28/2019   Hypothyroid 11/13/2016   Neuropathy  involving both lower extremities 11/13/2016   Cystitis, radiation 05/22/2014   Cervical cancer (HCC) 09/01/2013   Adenocarcinoma of cervix, 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   Asthma, mild intermittent 07/07/2009   INSOMNIA 05/28/2008   Generalized anxiety disorder 05/07/2008   HYPERTENSION, BENIGN 05/31/2007    PCP: Geraline Dorothyann JONETTA, MD  REFERRING PROVIDER: Skeet Juliene SAUNDERS, DO   REFERRING DIAG:  [phone] (ICD-10-CM) - Bilateral leg weakness G62.89 (ICD-10-CM) - 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  I was sore for a day or two after the last visit in a good way - I could feel that I had worked.    Eval: Right leg worse than left but both painful. Right has instances of knee just giving out, has neuropathy - like weakness, pain since radiation in 2015. The nerves have been affected and greatly limiting her activity. Does have symptoms of numbness, sensory deficits, weakness, ataxic gait, and painful but all vary based on the day.     Fluid intake: Yes: water    PAIN:  Are you having pain? A lot of RTLE numbness, hips feel 'off. NPRS scale:0 Pain location: bil legs Right leg  Pain type: aching Pain description: constant   Aggravating factors: walking, end of day, rainy days Relieving factors: try not to walk, decreasing activities    PRECAUTIONS: Other: cervical cancer with  radiation  RED FLAGS: None   WEIGHT BEARING RESTRICTIONS: No  FALLS:  Has patient fallen in last 6 months? Yes. Number of falls 1x/weekly due to weakness and decreased balance from having the radiation, trouble with low light - reports she does need to furniture walk to help with balance  LIVING ENVIRONMENT: Lives with: lives with their family  OCCUPATION: sits with laptop on her lap for 12 hours per day; unable to sit with legs down due to them falling asleep  PLOF: Independent  PATIENT GOALS: to strengthening enough to walk across grass, wants to have better balance, stronger legs                                        PERTINENT HISTORY:  ADHD; Cervical Cancer 2014; 9 weeks of radiation; Radical hysterectomy;    OBJECTIVE:  Note: Objective measures were completed at Evaluation unless otherwise noted.  DIAGNOSTIC FINDINGS:   PATIENT SURVEYS:  Lower Extremity Functional Score: 5 / 80 = 6.3 %  COGNITION: Overall cognitive status: Within functional limits for tasks assessed     SENSATION: Poor sensation at Rt LE - light touch felt along posterior gastroc only no awareness without visualization of being touched at medial, lateral, anterior lower leg, reports numbness in bil feet Rt>Lt  EDEMA:  Rt ankle sometimes swells per pt  MUSCLE LENGTH: Bil hamstrings and adductors limited by 25%;  POSTURE: rounded shoulders and forward head  LOWER EXTREMITY ROM:  weakness and poor coordination/proprioception   LOWER EXTREMITY MMT:  02/04/24 hip flexion was R 3/5, L 4/5  MMT Right Eval */5 Left Eval */5 Right 5/16 Left 5/16 RIGHT 6/6 LEFT  6/6  Hip flexion 2 3 4+ 5 3   Hip extension 2 3 4+ 4 4+ 4  Hip abduction 3 3+ 5- 5 5   Hip adduction 1+ 2+ 2+ 2+ 2+ 2+  Hip internal rotation        Hip external rotation        Knee flexion 2 3 3- 4 3+ 5  Knee extension 2+ 3 3- 5 3+   Ankle dorsiflexion 0 4 0 4+ long sitting 2 4  4+ long sitting 2    Ankle plantarflexion 3 4       Ankle inversion 1 4 3+ tested in long sitting 4+ tested in long sitting 3+ in long sitting 5 in long sitting  Ankle eversion 2 4 3+ tested in long sitting  4- tested in long sitting 2+ 4- tested in long sitting   (Blank rows = not tested)   FUNCTIONAL TESTS:  Five times Sit to Stand Test (FTSS) Method: Use a straight back chair with a solid seat that is 16-18" high. Ask participant to sit on the chair with arms folded across their chest.   Instructions: "Stand up and sit down as quickly as possible 5 times, keeping your arms folded across your chest."   Measurement: Stop timing when the participant stands the 5th time.  TIME: ______ (in seconds)  Times > 13.6 seconds is associated with increased disability and morbidity (Guralnik, 2000) Times > 15 seconds is predictive of recurrent falls in healthy individuals aged 89 and older (Buatois, et al., 2008) Normal performance values in community dwelling individuals aged 20 and older (Bohannon, 2006): 50-59 years: 8 seconds 60-69 years: 11.4 seconds 70-79 years: 12.6 seconds 80-89 years: 14.8 seconds  MCID: >= 2.3 seconds for Vestibular Disorders (Meretta, 2006)  5 times sit to stand: 22s with hands - very unstable with poor hip, posture, core control  Timed up and go (TUG): 28s with SBQC - min A  GAIT: Distance walked: 200' Assistive device utilized: Quad cane small base Level of assistance: CGA Comments: Rt LE demonstrating ataxic pattern, Lt trunk lean intermittently  to right self, poor step height bil, decreased stride length, limited glute activation                                                                                                                                 TREATMENT DATE:  04/29/24: Nu step seat 4,UE 9, Lev 5 x 7 min Seated yellow tied loop at knees arms locked with PT's - sit to stand x7 reps focusing on Rt knee control and equal WB bil LE Supine yellow tied loop at knees, stabilize Rt hooklying leg in  neutral and hip abd on Lt x 12 Quadruped hip ext toe slides alt LE x6 total Side plank on elbow and knee, top leg straight and anchored on table 5x5 Open book with top knee drawn froward with small ball adductor press into ball x10 each side SL top knee drawn forward on small ball, press into ball and lift lower leg to neutral alignment with knee (targeting anti-gravity isometric hold of hip IR and adductors) Happy baby, seated straddle forward and side stretches x30 each Resisted bwd walking 5lb x4 rounds using UE pulleys Lateral step ups Rt 10x5 - PT TC for proper trunk alignment to avoid locking out hip and hanging on lumbar spine  04/23/24: Nu step seat 4,UE 9, Lev 5 x 6 min Hip hiking bil x 10 on 2 step, hip hiking bil x10 on 4 step with rail - Pt lacks eccentric control on Rt LE and hip and knee snap back into flexion after step up March taps to 4 step to fatigue on each LE 2 rounds each (8-10 reps) -  PT cued TA indraw to avoid lumbar sway back to keep hip in neutral position on stance leg TKE yellow band Rt LE slow-motion x 8 reps Discussion of standing PF and TA without change in posture - alignment cues of sternum over pubic bone Resisted backward walking holding UE cable pulleys 5lb 3 rounds with close supervision/min A for LOB, focus on not leaning backwards and using core SL adduction press into small ball x10 each LE  04/16/24: Pt arrives for aquatic physical therapy. Treatment took place in 3.5-5.5 feet of water . Water  temperature was 91 degrees F. Pt entered the pool via stairs with heavy use of railings, step to step: PTA SBA. Pt requires buoyancy of water  for support and to offload joints with strengthening exercises.  Seated water  bench with 75% submersion Pt performed seated LE AROM exercises 20x in all planes, concurrent discussion of current status. 75% depth water  walking with PTA CGA at pelvis. 4 lengths in each direction, pt requires large noodle for balance and weight  added to RT ankle to increase proprioception. Pt kept ankle weight on for remainder of session. Standing slow marching 10x Bil, ham curls Bil 10x2 and hip abduction 10x2 holding onto wall. Standing single leg stance 3x 10 sec holding large noodle Bil.  Seated decompression with large noodle behind patient to rest low back 2 min 2x bouts. Prone float with large noodle for bil quad stretch, VC to flex knees.RT hip hile 2x10 with TC from PTA off first step. Supine float holding large noodle across chest then hip add/abd 10x, VC to use her abdominals more for cont  04/07/24 Nu step seat 4,UE 9, Lev 5 x 6 min Glute med ball press with large beach ball at barre and inside foot on 4 inch step Single leg taps 6 inch challenging so went to 4 inch taps B fwd and lateral focusing on glute med engagement and core stab to avoid lumbar extension Gait: adjusted cane to L UE and correct height used mirror for VC Hip hike off 2 inch step - challenging to raise hip so did supine for form then re-tried Pallof press yellow at barre and then rotations with straight arms   03/26/24; Pt arrives for aquatic physical therapy. Treatment took place in 3.5-5.5 feet of water . Water  temperature was. Pt entered the pool via stairs with heavy use of railings, step to step: PTA SBA. Pt requires buoyancy of water  for support and to offload joints with strengthening exercises.  Seated water  bench with 75% submersion Pt performed seated LE AROM exercises 20x in all planes, concurrent discussion of current status. 75% depth water  walking with PTA CGA at pelvis. 8 lengths in each direction, pt requires small noodle for balance. Standing slow marching 10x Bil, ham curls Bil 10x and hip abduction 10x holding onto wall. Standing single leg stance 3x 10 sec holding large noodle Bil.  Seated decompression with large noodle behind patient to rest low back 2 min 2x bouts. Prone float with large noodle for bil quad stretch, VC to flex knees. Supine  float holding large noodle across chest then hip add/abd 10x, VC to use her abdominals more for control.   PATIENT EDUCATION:  Education details: [email or web address] - uses App Person educated: Patient Education method: Explanation, Demonstration, Tactile cues, Verbal cues, and Handouts Education comprehension: verbalized understanding, returned demonstration, verbal cues required, tactile cues required, and needs further education  HOME EXERCISE PROGRAM: Access Code: [email or web address] URL: [email or web address] Date: 04/07/2024 Prepared by: Mliss  Exercises -  Supine Heel Slide with Strap  - 1 x daily - 7 x weekly - 3 sets - 10 reps - Pillow squeezes with kegel  - 1 x daily - 7 x weekly - 3 sets - 10 reps - Sit to Stand with Counter Support  - 1 x daily - 7 x weekly - 1 sets - 10 reps - Seated Long Arc Quad  - 1 x daily - 7 x weekly - 1-3 sets - 10 reps - 5 sec hold - Seated Isometric Hip Abduction with Resistance  - 1 x daily - 7 x weekly - 1-3 sets - 10 reps - Heel Raises with Counter Support  - 1 x daily - 7 x weekly - 1-3 sets - 10 reps - Standing Terminal Knee Extension at Wall with Ball  - 2 x daily - 7 x weekly - 2 sets - 10 reps - 5 sec hold - Squat with Chair Touch and Resistance Loop  - 1 x daily - 3 x weekly - 2 sets - 10 reps - Standing Knee Flexion with Counter Support  - 1 x daily - 3 x weekly - 2 sets - 10 reps - Supine Bridge with Resistance Band  - 1 x daily - 7 x weekly - 1-2 sets - 10 reps - 5 sec hold - Standing Hip Abduction on Slider  - 1 x daily - 3 x weekly - 2 sets - 10 reps - Calf stretch on rocker board  - 1 x daily - 7 x weekly - 1 sets - 3 reps - 30 sec  hold - Standing Anti-Rotation Press with Anchored Resistance  - 1 x daily - 3 x weekly - 2 sets - 10 reps - Standing Trunk Rotation with Resistance  - 1 x daily - 3 x weekly - 2 sets - 10 reps  ASSESSMENT:  CLINICAL IMPRESSION:  Pt reported 1-2 days of soreness in a good way after last session.  She could tell  she worked her weaker muscles and felt more awakened in them.  Today she was feeling the effects of the weather front coming in and requested starting on the mat table after warm up.  We focused on a lot of proximal hip isometric activations and afterwards we both noticed she had a much more stable gait with hip control on Rt in stance phase.  She continues to be very motivated and responds well to TC and VC within the session.     Eval: Patient is a 58 y.o. female  who was seen today for physical therapy evaluation and treatment for poor gait mechanics, impaired posture, impaired balance, decreased hip and core strength, increased fall risk. Pt has complex history of  Radiation-induced lumbosacral plexopathy and Polyradiculoneuropathy status post cervical cancer. Pt has completed pelvic floor PT recently and had positive outcomes with this and resolution of symptoms. Pt very motivated to participate in PT for bil LE strengthening and decreased fall risk. Pt demonstrated impairments with objective measurements of TUG and 5xSTS indicative of increased fall risk and balance deficits. Pt reports she is unable to hold and carry grandchildren, to walk on uneven surfaces, needs assistance with dressing, walking, transfers sometimes and wants to be more I. Pt would benefit from additional PT to further address deficits.    OBJECTIVE IMPAIRMENTS: Abnormal gait, decreased activity tolerance, decreased coordination, decreased endurance, decreased mobility, difficulty walking, decreased strength, increased fascial restrictions, increased muscle spasms, impaired flexibility, impaired sensation, improper body mechanics, postural dysfunction, and  pain.   ACTIVITY LIMITATIONS: carrying, lifting, bending, sitting, standing, squatting, stairs, transfers, bed mobility, locomotion level, and caring for others  PARTICIPATION LIMITATIONS: meal prep, cleaning, laundry, interpersonal relationship, driving, shopping, community  activity, occupation, and yard work  PERSONAL FACTORS: Fitness, Past/current experiences, Time since onset of injury/illness/exacerbation, and 1 comorbidity: medical history are also affecting patient's functional outcome.   REHAB POTENTIAL: Good  CLINICAL DECISION MAKING: Evolving/moderate complexity  EVALUATION COMPLEXITY: Moderate   GOALS: Goals reviewed with patient? Yes  SHORT TERM GOALS: Target date: 02/13/24 Pt to be I with HEP for carry over and continuing recommendations for improved outcomes.   Baseline: Goal status: MET  2.  Pt to demonstrate at least 3/5 Rt LE strength grossly and 4/5 Lt for improved pelvic stability and functional squats without falling.  Baseline:  Goal status: IPARTIALLY MET 03/03/24  3.  Pt to demonstrate ability to complete static standing without AD for at least 5 mins at midline to complete gentle kitchen tasks/household tasks without fear of falling.  Baseline:  Goal status: MET  4.  Pt to demonstrate I with dynamic sitting balance to better be able to reach floor level for retrieving fallen objects and putting on socks without falling Baseline:  Goal status: MET 03/03/24  LONG TERM GOALS: Target date: 07/17/24  Pt to be I with advanced HEP for carry over and continuing recommendations for improved outcomes.   Baseline:  Goal status: INITIAL  2.  Pt to report improved LEFS score to at least 30/80 for improved I with functional tasks at home such as dressing, walking from room to room.  Baseline:  Goal status: INITIAL  3.  Pt to demonstrate improved dynamic standing balance with or without AD at no more than CGA to better be able to play with grandchildren without falling.  Baseline:  Goal status: INITIAL  4.  Pt to demonstrate improved gait mechanics with LRAD for 150' with minimal compensate strategies to I with pt safety with ambulating into a store.  Baseline:  Goal status: INITIAL  5.  Pt to demonstrate ability to perform x5 Sit to  stand with at least 10# to more safely assist in caring for grandchildren without risk of falling.  Baseline:  Goal status: INITIAL    PLAN:  PT FREQUENCY: 2x/week  PT DURATION: 12 weeks  PLANNED INTERVENTIONS:    97110-Therapeutic exercises, 97530- Therapeutic activity, [id number]- Neuromuscular re-education, 97535- Self Care, [id number]- Manual therapy, [id number]- Gait training, [phone]- Canalith repositioning, [id number]- Aquatic Therapy, [phone]- Splinting, [id number]- Electrical stimulation (manual), [id number]- Vasopneumatic device, [id number]- Ultrasound, [id number]- Traction (mechanical), [id number]- Ionotophoresis 4mg /ml Dexamethasone , Patient/Family education, Balance training, Stair training, Taping, Dry Needling, Joint mobilization, Joint manipulation, Spinal manipulation, Spinal mobilization, Scar mobilization, DME instructions, Wheelchair mobility training, Cryotherapy, Moist heat, and Biofeedback  NEXT VISIT: for aquatic PT, core and LE strengthening and neuromuscular re-ed  for LAND: continue with resisted walking, focused standing glute med activities.   Treyvone Chelf, PT 04/29/24 8:47 AM

## 2024-04-30 ENCOUNTER — Ambulatory Visit: Admitting: Physical Therapy

## 2024-05-01 ENCOUNTER — Encounter: Payer: Self-pay | Admitting: Obstetrics and Gynecology

## 2024-05-02 ENCOUNTER — Encounter (HOSPITAL_BASED_OUTPATIENT_CLINIC_OR_DEPARTMENT_OTHER): Payer: Self-pay | Admitting: Physical Therapy

## 2024-05-02 ENCOUNTER — Other Ambulatory Visit: Payer: Self-pay | Admitting: Obstetrics and Gynecology

## 2024-05-02 ENCOUNTER — Encounter (HOSPITAL_BASED_OUTPATIENT_CLINIC_OR_DEPARTMENT_OTHER): Attending: Neurology | Admitting: Physical Therapy

## 2024-05-02 DIAGNOSIS — N3289 Other specified disorders of bladder: Secondary | ICD-10-CM

## 2024-05-02 DIAGNOSIS — M6281 Muscle weakness (generalized): Secondary | ICD-10-CM | POA: Diagnosis present

## 2024-05-02 DIAGNOSIS — N3281 Overactive bladder: Secondary | ICD-10-CM

## 2024-05-02 DIAGNOSIS — R269 Unspecified abnormalities of gait and mobility: Secondary | ICD-10-CM | POA: Diagnosis present

## 2024-05-02 DIAGNOSIS — R350 Frequency of micturition: Secondary | ICD-10-CM

## 2024-05-02 DIAGNOSIS — R293 Abnormal posture: Secondary | ICD-10-CM | POA: Insufficient documentation

## 2024-05-02 DIAGNOSIS — Z87898 Personal history of other specified conditions: Secondary | ICD-10-CM

## 2024-05-02 DIAGNOSIS — N319 Neuromuscular dysfunction of bladder, unspecified: Secondary | ICD-10-CM

## 2024-05-02 LAB — EHLERS-DANLOS SYNDROME PANEL

## 2024-05-02 MED ORDER — GEMTESA 75 MG PO TABS: 1.0000 | ORAL_TABLET | Freq: Every day | ORAL | 3 refills | Status: DC

## 2024-05-02 MED ORDER — TAMSULOSIN HCL 0.4 MG PO CAPS: 0.4000 mg | ORAL_CAPSULE | Freq: Every day | ORAL | 0 refills | Status: DC

## 2024-05-02 NOTE — Therapy (Signed)
 OUTPATIENT PHYSICAL THERAPY FEMALE PELVIC AND ORTHO TREATMENT   Patient Name: Anna Ramsey MRN: [id number] DOB:05/14/1966, 58 y.o., female Today's Date: 05/02/2024  END OF SESSION:  PT End of Session - 05/02/24 1142     Visit Number 23    Date for PT Re-Evaluation 07/17/24    Authorization Type [employer]    PT Start Time 1102    PT Stop Time 1143    PT Time Calculation (min) 41 min    Activity Tolerance Patient tolerated treatment well    Behavior During Therapy [facility] for tasks assessed/performed            Past Medical History:  Diagnosis Date   ADHD (attention deficit hyperactivity disorder) 2024   Dysuria    Erythematous bladder mucosa    Heart murmur    asymptomatic per pt --  no echo   Hematuria    History of cervical cancer    12/ 2014  Stage IB2--  s/p  TAH w/ BSO and pelvic lymphadectomy/  and radiation therapy   History of radiation therapy 10/20/13-11/28/13   pelvis 50.4 gray   Hypertension    Lesion of bladder    Mild intermittent asthma    Neuromuscular disorder (HCC)    Seasonal allergies    Thyroid disease    Past Surgical History:  Procedure Laterality Date   CYSTOSCOPY WITH BIOPSY N/A 04/29/2015   Procedure: CYSTOSCOPY WITH BIOPSY WITH FULGERATION;  Surgeon: Belvie LITTIE Clara, MD;  Location: [facility];  Service: Urology;  Laterality: N/A;  1 HR [id number]   FOOT SURGERY Left 2010   RADICAL ABDOMINAL HYSTERECTOMY  09-05-2013   w/ BILATERAL SALPINGOOPHORECTOMY AND PELVIC LYMPHADECTOMY   Patient Active Problem List   Diagnosis Date Noted   ADD (attention deficit disorder) 04/21/2024   SUI (stress urinary incontinence, female) 11/01/2023   History of urinary retention 11/01/2023   Neurogenic bladder 10/18/2023   Hematuria 10/18/2023   Acute urinary retention 10/18/2023   Stress and adjustment reaction 01/13/2021   Inattention 10/28/2019   Hypothyroid 11/13/2016   Neuropathy involving both lower extremities  11/13/2016   Cystitis, radiation 05/22/2014   Cervical cancer (HCC) 09/01/2013   Adenocarcinoma of cervix, 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   Asthma, mild intermittent 07/07/2009   INSOMNIA 05/28/2008   Generalized anxiety disorder 05/07/2008   HYPERTENSION, BENIGN 05/31/2007    PCP: Geraline Dorothyann JONETTA, MD  REFERRING PROVIDER: Skeet Juliene SAUNDERS, DO   REFERRING DIAG:  [phone] (ICD-10-CM) - Bilateral leg weakness G62.89 (ICD-10-CM) - 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 Right has instances of knee just giving out, has neuropathy - like weakness, pain since radiation in 2015. The nerves have been affected and greatly limiting her activity. Does have symptoms of numbness, sensory deficits, weakness, ataxic gait, and painful but all vary based on the day.     Fluid intake: Yes: water    PAIN:  Are you having pain? A lot of RTLE numbness, hips feel 'off. NPRS scale:0 Pain location: bil legs Right leg  Pain type: aching Pain description: constant   Aggravating factors: walking, end of day, rainy days Relieving factors: try not to walk, decreasing activities    PRECAUTIONS: Other: cervical cancer with radiation  RED FLAGS: None   WEIGHT BEARING RESTRICTIONS: No  FALLS:  Has patient fallen in last 6 months? Yes. Number of falls 1x/weekly due to weakness and decreased balance from having the  radiation, trouble with low light - reports she does need to furniture walk to help with balance  LIVING ENVIRONMENT: Lives with: lives with their family  OCCUPATION: sits with laptop on her lap for 12 hours per day; unable to sit with legs down due to them falling asleep  PLOF: Independent  PATIENT GOALS: to strengthening enough to walk across grass, wants to have better balance, stronger legs                                        PERTINENT HISTORY:  ADHD; Cervical Cancer 2014; 9 weeks of radiation; Radical hysterectomy;    OBJECTIVE:  Note: Objective measures were completed at Evaluation unless otherwise noted.  DIAGNOSTIC FINDINGS:   PATIENT SURVEYS:  Lower Extremity Functional Score: 5 / 80 = 6.3 %  COGNITION: Overall cognitive status: Within functional limits for tasks assessed     SENSATION: Poor sensation at Rt LE - light touch felt along posterior gastroc only no awareness without visualization of being touched at medial, lateral, anterior lower leg, reports numbness in bil feet Rt>Lt  EDEMA:  Rt ankle sometimes swells per pt  MUSCLE LENGTH: Bil hamstrings and adductors limited by 25%;  POSTURE: rounded shoulders and forward head  LOWER EXTREMITY ROM:  weakness and poor coordination/proprioception   LOWER EXTREMITY MMT:  02/04/24 hip flexion was R 3/5, L 4/5  MMT Right Eval */5 Left Eval */5 Right 5/16 Left 5/16 RIGHT 6/6 LEFT  6/6  Hip flexion 2 3 4+ 5 3   Hip extension 2 3 4+ 4 4+ 4  Hip abduction 3 3+ 5- 5 5   Hip adduction 1+ 2+ 2+ 2+ 2+ 2+  Hip internal rotation        Hip external rotation        Knee flexion 2 3 3- 4 3+ 5  Knee extension 2+ 3 3- 5 3+   Ankle dorsiflexion 0 4 0 4+ long sitting 2 4  4+ long sitting 2    Ankle plantarflexion 3 4      Ankle inversion 1 4 3+ tested in long sitting 4+ tested in long sitting 3+ in long sitting 5 in long sitting  Ankle eversion 2 4 3+ tested in long sitting 4- tested in long sitting 2+ 4-  tested in long sitting   (Blank rows = not tested)   FUNCTIONAL TESTS:  Five times Sit  to Stand Test (FTSS) Method: Use a straight back chair with a solid seat that is 16-18" high. Ask participant to sit on the chair with arms folded across their chest.   Instructions: "Stand up and sit down as quickly as possible 5 times, keeping your arms folded across your chest."   Measurement: Stop timing when the participant stands the 5th time.  TIME: ______ (in seconds)  Times > 13.6 seconds is associated with increased disability and morbidity (Guralnik, 2000) Times > 15 seconds is predictive of recurrent falls in healthy individuals aged 87 and older (Buatois, et al., 2008) Normal performance values in community dwelling individuals aged 28 and older (Bohannon, 2006): 50-59 years: 8 seconds 60-69 years: 11.4 seconds 70-79 years: 12.6 seconds 80-89 years: 14.8 seconds  MCID: >= 2.3 seconds for Vestibular Disorders (Meretta, 2006)  5 times sit to stand: 22s with hands - very unstable with poor hip, posture, core control  Timed up and go (TUG): 28s with SBQC - min A  GAIT: Distance walked: 200' Assistive device utilized: Quad cane small base Level of assistance: CGA Comments: Rt LE demonstrating ataxic pattern, Lt trunk lean intermittently  to right self, poor step height bil, decreased stride length, limited glute activation                                                                                                                                 TREATMENT DATE:  [facility] Adult PT Treatment:                                                DATE: 05/02/24 Pt seen for aquatic therapy today.  Treatment took place in water  3.5-4.75 ft in depth at the [facility] pool. Temp of water  was 91.  Pt entered/exited the pool via stairs using step to pattern with hand rail.  - walking forward and back ue support barbell 3.6 ft -seated on lift with 2.5 lb blue ankle weight donned rle: LAQ;  hip add/abd.  Worn for remainder of session -return to walking with 2.5 lb forward back and side stepping  -seated : cycling quick fatigue -Tra engagement/balance retraining: 1/2 noodle pull down wide stance then staggered x 5 reps ea.  Vc and demonstration for execution. Requires multiple trials to create motor plan.  Good execution and toleration -Side stepping 2 widths ue support barbell   Pt requires the buoyancy and hydrostatic pressure of water  for support, and to offload joints by unweighting joint load by at least 50 % in navel deep water  and by at least 75-80% in chest to neck deep water .  Viscosity of the water  is needed for resistance of strengthening. Water  current perturbations provides challenge to standing balance requiring increased core activation.     04/29/24: Nu step seat 4,UE 9, [place]  5 x 7 min Seated yellow tied loop at knees arms locked with PT's - sit to stand x7 reps focusing on Rt knee control and equal WB bil LE Supine yellow tied loop at knees, stabilize Rt hooklying leg in neutral and hip abd on Lt x 12 Quadruped hip ext toe slides alt LE x6 total Side plank on elbow and knee, top leg straight and anchored on table 5x5 Open book with top knee drawn froward with small ball adductor press into ball x10 each side SL top knee drawn forward on small ball, press into ball and lift lower leg to neutral alignment with knee (targeting anti-gravity isometric hold of hip IR and adductors) Happy baby, seated straddle forward and side stretches x30 each Resisted bwd walking 5lb x4 rounds using UE pulleys Lateral step ups Rt 10x5 - PT TC for proper trunk alignment to avoid locking out hip and hanging on lumbar spine  04/23/24: Nu step seat 4,UE 9, Lev 5 x 6 min Hip hiking bil x 10 on 2 step, hip hiking bil x10 on 4 step with rail - Pt lacks eccentric control on Rt LE and hip and knee snap back into flexion after step up March taps to 4 step to fatigue on each LE 2 rounds  each (8-10 reps) - PT cued TA indraw to avoid lumbar sway back to keep hip in neutral position on stance leg TKE yellow band Rt LE slow-motion x 8 reps Discussion of standing PF and TA without change in posture - alignment cues of sternum over pubic bone Resisted backward walking holding UE cable pulleys 5lb 3 rounds with close supervision/min A for LOB, focus on not leaning backwards and using core SL adduction press into small ball x10 each LE  04/16/24: Pt arrives for aquatic physical therapy. Treatment took place in 3.5-5.5 feet of water . Water  temperature was 91 degrees F. Pt entered the pool via stairs with heavy use of railings, step to step: PTA SBA. Pt requires buoyancy of water  for support and to offload joints with strengthening exercises.  Seated water  bench with 75% submersion Pt performed seated LE AROM exercises 20x in all planes, concurrent discussion of current status. 75% depth water  walking with PTA CGA at pelvis. 4 lengths in each direction, pt requires large noodle for balance and weight added to RT ankle to increase proprioception. Pt kept ankle weight on for remainder of session. Standing slow marching 10x Bil, ham curls Bil 10x2 and hip abduction 10x2 holding onto wall. Standing single leg stance 3x 10 sec holding large noodle Bil.  Seated decompression with large noodle behind patient to rest low back 2 min 2x bouts. Prone float with large noodle for bil quad stretch, VC to flex knees.RT hip hile 2x10 with TC from PTA off first step. Supine float holding large noodle across chest then hip add/abd 10x, VC to use her abdominals more for cont  04/07/24 Nu step seat 4,UE 9, Lev 5 x 6 min Glute med ball press with large beach ball at barre and inside foot on 4 inch step Single leg taps 6 inch challenging so went to 4 inch taps B fwd and lateral focusing on glute med engagement and core stab to avoid lumbar extension Gait: adjusted cane to L UE and correct height used mirror for  VC Hip hike off 2 inch step - challenging to raise hip so did supine for form then re-tried Pallof press yellow at barre and then rotations with straight arms  03/26/24; Pt arrives for aquatic physical therapy. Treatment took place in 3.5-5.5 feet of water . Water  temperature was. Pt entered the pool via stairs with heavy use of railings, step to step: PTA SBA. Pt requires buoyancy of water  for support and to offload joints with strengthening exercises.  Seated water  bench with 75% submersion Pt performed seated LE AROM exercises 20x in all planes, concurrent discussion of current status. 75% depth water  walking with PTA CGA at pelvis. 8 lengths in each direction, pt requires small noodle for balance. Standing slow marching 10x Bil, ham curls Bil 10x and hip abduction 10x holding onto wall. Standing single leg stance 3x 10 sec holding large noodle Bil.  Seated decompression with large noodle behind patient to rest low back 2 min 2x bouts. Prone float with large noodle for bil quad stretch, VC to flex knees. Supine float holding large noodle across chest then hip add/abd 10x, VC to use her abdominals more for control.   PATIENT EDUCATION:  Education details: [email or web address] - uses App Person educated: Patient Education method: Explanation, Demonstration, Tactile cues, Verbal cues, and Handouts Education comprehension: verbalized understanding, returned demonstration, verbal cues required, tactile cues required, and needs further education  HOME EXERCISE PROGRAM: Access Code: [email or web address] URL: [email or web address] Date: 04/07/2024 Prepared by: Mliss  Exercises - Supine Heel Slide with Strap  - 1 x daily - 7 x weekly - 3 sets - 10 reps - Pillow squeezes with kegel  - 1 x daily - 7 x weekly - 3 sets - 10 reps - Sit to Stand with Counter Support  - 1 x daily - 7 x weekly - 1 sets - 10 reps - Seated Long Arc Quad  - 1 x daily - 7 x weekly - 1-3 sets - 10 reps - 5 sec hold - Seated Isometric  Hip Abduction with Resistance  - 1 x daily - 7 x weekly - 1-3 sets - 10 reps - Heel Raises with Counter Support  - 1 x daily - 7 x weekly - 1-3 sets - 10 reps - Standing Terminal Knee Extension at Wall with Ball  - 2 x daily - 7 x weekly - 2 sets - 10 reps - 5 sec hold - Squat with Chair Touch and Resistance Loop  - 1 x daily - 3 x weekly - 2 sets - 10 reps - Standing Knee Flexion with Counter Support  - 1 x daily - 3 x weekly - 2 sets - 10 reps - Supine Bridge with Resistance Band  - 1 x daily - 7 x weekly - 1-2 sets - 10 reps - 5 sec hold - Standing Hip Abduction on Slider  - 1 x daily - 3 x weekly - 2 sets - 10 reps - Calf stretch on rocker board  - 1 x daily - 7 x weekly - 1 sets - 3 reps - 30 sec  hold - Standing Anti-Rotation Press with Anchored Resistance  - 1 x daily - 3 x weekly - 2 sets - 10 reps - Standing Trunk Rotation with Resistance  - 1 x daily - 3 x weekly - 2 sets - 10 reps  ASSESSMENT:  CLINICAL IMPRESSION:  Progressed LE and core strengthening.  Worked balance with good toleration.  Use of 2.5 lb ankle weight used for approximation improves balance significantly. She has good toleration of session. May have increased intensity of exercise slightly she is encouraged to rest post session as needed.  She VU. Goals ongoing  Eval: Patient is a 58 y.o. female  who was seen today for physical therapy evaluation and treatment for poor gait mechanics, impaired posture, impaired balance, decreased hip and core strength, increased fall risk. Pt has complex history of  Radiation-induced lumbosacral plexopathy and Polyradiculoneuropathy status post cervical cancer. Pt has completed pelvic floor PT recently and had positive outcomes with this and resolution of symptoms. Pt very motivated to participate in PT for bil LE strengthening and decreased fall risk. Pt demonstrated impairments with objective measurements of TUG and 5xSTS indicative of increased fall risk and balance deficits. Pt  reports she is unable to hold and carry grandchildren, to walk on uneven surfaces, needs assistance with dressing, walking, transfers sometimes and wants to be more I. Pt would benefit from additional PT to further address deficits.    OBJECTIVE IMPAIRMENTS: Abnormal gait, decreased activity tolerance, decreased coordination, decreased endurance, decreased mobility, difficulty walking, decreased strength, increased fascial restrictions, increased muscle spasms, impaired flexibility, impaired sensation, improper body mechanics, postural dysfunction, and pain.   ACTIVITY LIMITATIONS: carrying, lifting, bending, sitting, standing, squatting, stairs, transfers, bed mobility, locomotion level, and caring for others  PARTICIPATION LIMITATIONS: meal prep, cleaning, laundry, interpersonal relationship, driving, shopping, community activity, occupation, and yard work  PERSONAL FACTORS: Fitness, Past/current experiences, Time since onset of injury/illness/exacerbation, and 1 comorbidity: medical history are also affecting patient's functional outcome.   REHAB POTENTIAL: Good  CLINICAL DECISION MAKING: Evolving/moderate complexity  EVALUATION COMPLEXITY: Moderate   GOALS: Goals reviewed with patient? Yes  SHORT TERM GOALS: Target date: 02/13/24 Pt to be I with HEP for carry over and continuing recommendations for improved outcomes.   Baseline: Goal status: MET  2.  Pt to demonstrate at least 3/5 Rt LE strength grossly and 4/5 Lt for improved pelvic stability and functional squats without falling.  Baseline:  Goal status: IPARTIALLY MET 03/03/24  3.  Pt to demonstrate ability to complete static standing without AD for at least 5 mins at midline to complete gentle kitchen tasks/household tasks without fear of falling.  Baseline:  Goal status: MET  4.  Pt to demonstrate I with dynamic sitting balance to better be able to reach floor level for retrieving fallen objects and putting on socks without  falling Baseline:  Goal status: MET 03/03/24  LONG TERM GOALS: Target date: 07/17/24  Pt to be I with advanced HEP for carry over and continuing recommendations for improved outcomes.   Baseline:  Goal status: INITIAL  2.  Pt to report improved LEFS score to at least 30/80 for improved I with functional tasks at home such as dressing, walking from room to room.  Baseline:  Goal status: INITIAL  3.  Pt to demonstrate improved dynamic standing balance with or without AD at no more than CGA to better be able to play with grandchildren without falling.  Baseline:  Goal status: INITIAL  4.  Pt to demonstrate improved gait mechanics with LRAD for 150' with minimal compensate strategies to I with pt safety with ambulating into a store.  Baseline:  Goal status: INITIAL  5.  Pt to demonstrate ability to perform x5 Sit to stand with at least 10# to more safely assist in caring for grandchildren without risk of falling.  Baseline:  Goal status: INITIAL    PLAN:  PT FREQUENCY: 2x/week  PT DURATION: 12 weeks  PLANNED INTERVENTIONS:    97110-Therapeutic exercises, 97530- Therapeutic activity, [id number]- Neuromuscular re-education, 97535- Self Care, [id number]- Manual therapy, [id number]- Gait training, [phone]- Canalith repositioning, [id number]- Aquatic  Therapy, 97760- Splinting, 02967- Electrical stimulation (manual), [id number]- Vasopneumatic device, [id number]- Ultrasound, [id number]- Traction (mechanical), [id number]- Ionotophoresis 4mg /ml Dexamethasone , Patient/Family education, Balance training, Stair training, Taping, Dry Needling, Joint mobilization, Joint manipulation, Spinal manipulation, Spinal mobilization, Scar mobilization, DME instructions, Wheelchair mobility training, Cryotherapy, Moist heat, and Biofeedback  NEXT VISIT: for aquatic PT, core and LE strengthening and neuromuscular re-ed  for LAND: continue with resisted walking, focused standing glute med activities.   Ronal Levan) Shenea Giacobbe MPT 05/02/24 11:43  AM [facility] [address] [place], [place], [address] Phone: [phone]   Fax:  [phone]

## 2024-05-04 NOTE — Therapy (Signed)
 OUTPATIENT PHYSICAL THERAPY FEMALE PELVIC AND ORTHO TREATMENT   Patient Name: Anna Ramsey MRN: [id number] DOB:1966-09-23, 58 y.o., female Today's Date: 05/05/2024  END OF SESSION:  PT End of Session - 05/05/24 0845     Visit Number 24    Date for PT Re-Evaluation 07/17/24    Authorization Type [employer]    PT Start Time 0800    PT Stop Time 0845    PT Time Calculation (min) 45 min    Activity Tolerance Patient tolerated treatment well    Behavior During Therapy [facility] for tasks assessed/performed             Past Medical History:  Diagnosis Date   ADHD (attention deficit hyperactivity disorder) 2024   Dysuria    Erythematous bladder mucosa    Heart murmur    asymptomatic per pt --  no echo   Hematuria    History of cervical cancer    12/ 2014  Stage IB2--  s/p  TAH w/ BSO and pelvic lymphadectomy/  and radiation therapy   History of radiation therapy 10/20/13-11/28/13   pelvis 50.4 gray   Hypertension    Lesion of bladder    Mild intermittent asthma    Neuromuscular disorder (HCC)    Seasonal allergies    Thyroid disease    Past Surgical History:  Procedure Laterality Date   CYSTOSCOPY WITH BIOPSY N/A 04/29/2015   Procedure: CYSTOSCOPY WITH BIOPSY WITH FULGERATION;  Surgeon: Belvie LITTIE Clara, MD;  Location: [facility];  Service: Urology;  Laterality: N/A;  1 HR [phone] [id number]   FOOT SURGERY Left 2010   RADICAL ABDOMINAL HYSTERECTOMY  09-05-2013   w/ BILATERAL SALPINGOOPHORECTOMY AND PELVIC LYMPHADECTOMY   Patient Active Problem List   Diagnosis Date Noted   ADD (attention deficit disorder) 04/21/2024   SUI (stress urinary incontinence, female) 11/01/2023   History of urinary retention 11/01/2023   Neurogenic bladder 10/18/2023   Hematuria 10/18/2023   Acute urinary retention 10/18/2023   Stress and adjustment reaction 01/13/2021   Inattention 10/28/2019   Hypothyroid 11/13/2016   Neuropathy involving both lower  extremities 11/13/2016   Cystitis, radiation 05/22/2014   Cervical cancer (HCC) 09/01/2013   Adenocarcinoma of cervix, 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   Asthma, mild intermittent 07/07/2009   INSOMNIA 05/28/2008   Generalized anxiety disorder 05/07/2008   HYPERTENSION, BENIGN 05/31/2007    PCP: Geraline Dorothyann JONETTA, MD  REFERRING PROVIDER: Skeet Juliene SAUNDERS, DO   REFERRING DIAG:  [phone] (ICD-10-CM) - Bilateral leg weakness G62.89 (ICD-10-CM) - 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 I feel like I'm walking out of my shoe.    Eval: Right leg worse than left but both painful. Right has instances of knee just giving out, has neuropathy - like weakness, pain since radiation in 2015. The nerves have been affected and greatly limiting her activity. Does have symptoms of numbness, sensory deficits, weakness, ataxic gait, and painful but all vary based on the day.     Fluid intake: Yes: water    PAIN:  Are you having pain? A lot of RTLE numbness, hips feel 'off. NPRS scale:0 Pain location: bil legs Right leg  Pain type: aching Pain description: constant   Aggravating factors: walking, end of day, rainy days Relieving factors: try not to walk, decreasing activities    PRECAUTIONS: Other: cervical cancer with radiation  RED FLAGS: None   WEIGHT BEARING RESTRICTIONS: No  FALLS:  Has patient fallen in last 6 months? Yes. Number of falls 1x/weekly due to weakness and  decreased balance from having the radiation, trouble with low light - reports she does need to furniture walk to help with balance  LIVING ENVIRONMENT: Lives with: lives with their family  OCCUPATION: sits with laptop on her lap for 12 hours per day; unable to sit with legs down due to them falling asleep  PLOF: Independent  PATIENT GOALS: to strengthening enough to walk across grass, wants to have better balance, stronger legs                                        PERTINENT HISTORY:  ADHD; Cervical Cancer 2014; 9 weeks of radiation; Radical hysterectomy;    OBJECTIVE:  Note: Objective measures were completed at Evaluation unless otherwise noted.  DIAGNOSTIC FINDINGS:   PATIENT SURVEYS:  Lower Extremity Functional Score: 5 / 80 = 6.3 %  COGNITION: Overall cognitive status: Within functional limits for tasks assessed     SENSATION: Poor sensation at Rt LE - light touch felt along posterior gastroc only no awareness without visualization of being touched at medial, lateral, anterior lower leg, reports numbness in bil feet Rt>Lt  EDEMA:  Rt ankle sometimes swells per pt  MUSCLE LENGTH: Bil hamstrings and adductors limited by 25%;  POSTURE: rounded shoulders and forward head  LOWER EXTREMITY ROM:  weakness and poor coordination/proprioception   LOWER EXTREMITY MMT:  02/04/24 hip flexion was R 3/5, L 4/5  MMT Right Eval */5 Left Eval */5 Right 5/16 Left 5/16 RIGHT 6/6 LEFT  6/6  Hip flexion 2 3 4+ 5 3   Hip extension 2 3 4+ 4 4+ 4  Hip abduction 3 3+ 5- 5 5   Hip adduction 1+ 2+ 2+ 2+ 2+ 2+  Hip internal rotation        Hip external rotation        Knee flexion 2 3 3- 4 3+ 5  Knee extension 2+ 3 3- 5 3+   Ankle dorsiflexion 0 4 0 4+ long sitting 2 4  4+ long sitting 2    Ankle plantarflexion 3 4      Ankle inversion 1 4 3+ tested in long sitting 4+ tested in long sitting 3+ in long sitting 5 in long sitting  Ankle eversion 2 4 3+ tested in long sitting 4-  tested in long sitting 2+ 4- tested in long sitting   (Blank rows = not  tested)   FUNCTIONAL TESTS:  Five times Sit to Stand Test (FTSS) Method: Use a straight back chair with a solid seat that is 16-18" high. Ask participant to sit on the chair with arms folded across their chest.   Instructions: "Stand up and sit down as quickly as possible 5 times, keeping your arms folded across your chest."   Measurement: Stop timing when the participant stands the 5th time.  TIME: ______ (in seconds)  Times > 13.6 seconds is associated with increased disability and morbidity (Guralnik, 2000) Times > 15 seconds is predictive of recurrent falls in healthy individuals aged 34 and older (Buatois, et al., 2008) Normal performance values in community dwelling individuals aged 1 and older (Bohannon, 2006): 50-59 years: 8 seconds 60-69 years: 11.4 seconds 70-79 years: 12.6 seconds 80-89 years: 14.8 seconds  MCID: >= 2.3 seconds for Vestibular Disorders (Meretta, 2006)  5 times sit to stand: 22s with hands - very unstable with poor hip, posture, core control  Timed up and go (TUG): 28s with SBQC - min A  GAIT: Distance walked: 200' Assistive device utilized: Quad cane small base Level of assistance: CGA Comments: Rt LE demonstrating ataxic pattern, Lt trunk lean intermittently  to right self, poor step height bil, decreased stride length, limited glute activation                                                                                                                                 TREATMENT DATE:  05/05/24: Nu step seat 4,UE 9, Lev 5 x 6 min - used yellow Tband to aid in correct alignment Seated clam with yellow band x 10 , then unilateral L only (isometric hold R) x  Seated yellow tied loop at knees arms locked with PT's - sit to stand x5 reps focusing on Rt knee control and equal WB bil LE Supine yellow tied loop at knees - pt experienced vertigo (Epley maneuver  administered) Quadruped hip ext toe slides alt LE x6 total Modified plank knees and elbows max hold - multiple reps S/L on elbow with small ball adductor press into ball x10 R SL knee extension with adductor press into ball under knee R S/L hips at 45 deg: hip ABD, hip flex to ext in ABD, toe and heel taps front and back Happy baby Discussion of her current status today and the difficulties when she has bad days       [facility] Adult PT Treatment:                                                DATE: 05/02/24 Pt seen for aquatic therapy today.  Treatment took place in water  3.5-4.75 ft in depth at the [facility] pool. Temp of water  was 91.  Pt entered/exited the pool via stairs using  step to pattern with hand rail.  - walking forward and back ue support barbell 3.6 ft -seated on lift with 2.5 lb blue ankle weight donned rle: LAQ; hip add/abd.  Worn for remainder of session -return to walking with 2.5 lb forward back and side stepping  -seated : cycling quick fatigue -Tra engagement/balance retraining: 1/2 noodle pull down wide stance then staggered x 5 reps ea.  Vc and demonstration for execution. Requires multiple trials to create motor plan.  Good execution and toleration -Side stepping 2 widths ue support barbell   Pt requires the buoyancy and hydrostatic pressure of water  for support, and to offload joints by unweighting joint load by at least 50 % in navel deep water  and by at least 75-80% in chest to neck deep water .  Viscosity of the water  is needed for resistance of strengthening. Water  current perturbations provides challenge to standing balance requiring increased core activation.     04/29/24: Nu step seat 4,UE 9, Lev 5 x 7 min Seated yellow tied loop at knees arms locked with PT's - sit to stand x7 reps focusing on Rt knee control and equal WB bil LE Supine yellow tied loop at knees, stabilize Rt hooklying leg in neutral and hip abd on Lt x 12 Quadruped hip ext toe  slides alt LE x6 total Side plank on elbow and knee, top leg straight and anchored on table 5x5 Open book with top knee drawn froward with small ball adductor press into ball x10 each side SL top knee drawn forward on small ball, press into ball and lift lower leg to neutral alignment with knee (targeting anti-gravity isometric hold of hip IR and adductors) Happy baby, seated straddle forward and side stretches x30 each Resisted bwd walking 5lb x4 rounds using UE pulleys Lateral step ups Rt 10x5 - PT TC for proper trunk alignment to avoid locking out hip and hanging on lumbar spine  04/23/24: Nu step seat 4,UE 9, Lev 5 x 6 min Hip hiking bil x 10 on 2 step, hip hiking bil x10 on 4 step with rail - Pt lacks eccentric control on Rt LE and hip and knee snap back into flexion after step up March taps to 4 step to fatigue on each LE 2 rounds each (8-10 reps) - PT cued TA indraw to avoid lumbar sway back to keep hip in neutral position on stance leg TKE yellow band Rt LE slow-motion x 8 reps Discussion of standing PF and TA without change in posture - alignment cues of sternum over pubic bone Resisted backward walking holding UE cable pulleys 5lb 3 rounds with close supervision/min A for LOB, focus on not leaning backwards and using core SL adduction press into small ball x10 each LE  04/16/24: Pt arrives for aquatic physical therapy. Treatment took place in 3.5-5.5 feet of water . Water  temperature was 91 degrees F. Pt entered the pool via stairs with heavy use of railings, step to step: PTA SBA. Pt requires buoyancy of water  for support and to offload joints with strengthening exercises.  Seated water  bench with 75% submersion Pt performed seated LE AROM exercises 20x in all planes, concurrent discussion of current status. 75% depth water  walking with PTA CGA at pelvis. 4 lengths in each direction, pt requires large noodle for balance and weight added to RT ankle to increase proprioception. Pt kept  ankle weight on for remainder of session. Standing slow marching 10x Bil, ham curls Bil 10x2 and hip abduction 10x2 holding onto wall.  Standing single leg stance 3x 10 sec holding large noodle Bil.  Seated decompression with large noodle behind patient to rest low back 2 min 2x bouts. Prone float with large noodle for bil quad stretch, VC to flex knees.RT hip hile 2x10 with TC from PTA off first step. Supine float holding large noodle across chest then hip add/abd 10x, VC to use her abdominals more for cont  04/07/24 Nu step seat 4,UE 9, Lev 5 x 6 min Glute med ball press with large beach ball at barre and inside foot on 4 inch step Single leg taps 6 inch challenging so went to 4 inch taps B fwd and lateral focusing on glute med engagement and core stab to avoid lumbar extension Gait: adjusted cane to L UE and correct height used mirror for VC Hip hike off 2 inch step - challenging to raise hip so did supine for form then re-tried Pallof press yellow at barre and then rotations with straight arms   03/26/24; Pt arrives for aquatic physical therapy. Treatment took place in 3.5-5.5 feet of water . Water  temperature was. Pt entered the pool via stairs with heavy use of railings, step to step: PTA SBA. Pt requires buoyancy of water  for support and to offload joints with strengthening exercises.  Seated water  bench with 75% submersion Pt performed seated LE AROM exercises 20x in all planes, concurrent discussion of current status. 75% depth water  walking with PTA CGA at pelvis. 8 lengths in each direction, pt requires small noodle for balance. Standing slow marching 10x Bil, ham curls Bil 10x and hip abduction 10x holding onto wall. Standing single leg stance 3x 10 sec holding large noodle Bil.  Seated decompression with large noodle behind patient to rest low back 2 min 2x bouts. Prone float with large noodle for bil quad stretch, VC to flex knees. Supine float holding large noodle across chest then hip add/abd  10x, VC to use her abdominals more for control.   PATIENT EDUCATION:  Education details: [email or web address] - uses App Person educated: Patient Education method: Explanation, Demonstration, Tactile cues, Verbal cues, and Handouts Education comprehension: verbalized understanding, returned demonstration, verbal cues required, tactile cues required, and needs further education  HOME EXERCISE PROGRAM: Access Code: [email or web address] URL: [email or web address] Date: 04/07/2024 Prepared by: Mliss  Exercises - Supine Heel Slide with Strap  - 1 x daily - 7 x weekly - 3 sets - 10 reps - Pillow squeezes with kegel  - 1 x daily - 7 x weekly - 3 sets - 10 reps - Sit to Stand with Counter Support  - 1 x daily - 7 x weekly - 1 sets - 10 reps - Seated Long Arc Quad  - 1 x daily - 7 x weekly - 1-3 sets - 10 reps - 5 sec hold - Seated Isometric Hip Abduction with Resistance  - 1 x daily - 7 x weekly - 1-3 sets - 10 reps - Heel Raises with Counter Support  - 1 x daily - 7 x weekly - 1-3 sets - 10 reps - Standing Terminal Knee Extension at Wall with Ball  - 2 x daily - 7 x weekly - 2 sets - 10 reps - 5 sec hold - Squat with Chair Touch and Resistance Loop  - 1 x daily - 3 x weekly - 2 sets - 10 reps - Standing Knee Flexion with Counter Support  - 1 x daily - 3 x weekly - 2 sets - 10 reps - Supine Bridge with Resistance  Band  - 1 x daily - 7 x weekly - 1-2 sets - 10 reps - 5 sec hold - Standing Hip Abduction on Slider  - 1 x daily - 3 x weekly - 2 sets - 10 reps - Calf stretch on rocker board  - 1 x daily - 7 x weekly - 1 sets - 3 reps - 30 sec  hold - Standing Anti-Rotation Press with Anchored Resistance  - 1 x daily - 3 x weekly - 2 sets - 10 reps - Standing Trunk Rotation with Resistance  - 1 x daily - 3 x weekly - 2 sets - 10 reps  ASSESSMENT:  CLINICAL IMPRESSION:  Patient very limited with exercise today which she attributes to the weather. She had poor control of her R LE with gait and with exercise  today. She did not tolerate standing TE today. Additionally, she experienced vertigo when she moved to supine which was corrected with the Epley maneuver.  Patient with difficulty maintaining hip ABD  (neutral) on Nustep in her R hip, but did well with seated isometrics with resistance. We focused on what she could tolerate today. Patient states that days like this are why she gets down sometimes, but acknowledged that she doesn't have to start all over again when she feels better. Plan to return to previous exercises at next land visit focusing on hip ADDuctors and flexors. She continues to demonstrate potential for improvement and would benefit from continued skilled therapy to address impairments.       Eval: Patient is a 58 y.o. female  who was seen today for physical therapy evaluation and treatment for poor gait mechanics, impaired posture, impaired balance, decreased hip and core strength, increased fall risk. Pt has complex history of  Radiation-induced lumbosacral plexopathy and Polyradiculoneuropathy status post cervical cancer. Pt has completed pelvic floor PT recently and had positive outcomes with this and resolution of symptoms. Pt very motivated to participate in PT for bil LE strengthening and decreased fall risk. Pt demonstrated impairments with objective measurements of TUG and 5xSTS indicative of increased fall risk and balance deficits. Pt reports she is unable to hold and carry grandchildren, to walk on uneven surfaces, needs assistance with dressing, walking, transfers sometimes and wants to be more I. Pt would benefit from additional PT to further address deficits.    OBJECTIVE IMPAIRMENTS: Abnormal gait, decreased activity tolerance, decreased coordination, decreased endurance, decreased mobility, difficulty walking, decreased strength, increased fascial restrictions, increased muscle spasms, impaired flexibility, impaired sensation, improper body mechanics, postural dysfunction, and  pain.   ACTIVITY LIMITATIONS: carrying, lifting, bending, sitting, standing, squatting, stairs, transfers, bed mobility, locomotion level, and caring for others  PARTICIPATION LIMITATIONS: meal prep, cleaning, laundry, interpersonal relationship, driving, shopping, community activity, occupation, and yard work  PERSONAL FACTORS: Fitness, Past/current experiences, Time since onset of injury/illness/exacerbation, and 1 comorbidity: medical history are also affecting patient's functional outcome.   REHAB POTENTIAL: Good  CLINICAL DECISION MAKING: Evolving/moderate complexity  EVALUATION COMPLEXITY: Moderate   GOALS: Goals reviewed with patient? Yes  SHORT TERM GOALS: Target date: 02/13/24 Pt to be I with HEP for carry over and continuing recommendations for improved outcomes.   Baseline: Goal status: MET  2.  Pt to demonstrate at least 3/5 Rt LE strength grossly and 4/5 Lt for improved pelvic stability and functional squats without falling.  Baseline:  Goal status: IPARTIALLY MET 03/03/24  3.  Pt to demonstrate ability to complete static standing without AD for at least 5 mins at  midline to complete gentle kitchen tasks/household tasks without fear of falling.  Baseline:  Goal status: MET  4.  Pt to demonstrate I with dynamic sitting balance to better be able to reach floor level for retrieving fallen objects and putting on socks without falling Baseline:  Goal status: MET 03/03/24  LONG TERM GOALS: Target date: 07/17/24  Pt to be I with advanced HEP for carry over and continuing recommendations for improved outcomes.   Baseline:  Goal status: INITIAL  2.  Pt to report improved LEFS score to at least 30/80 for improved I with functional tasks at home such as dressing, walking from room to room.  Baseline:  Goal status: INITIAL  3.  Pt to demonstrate improved dynamic standing balance with or without AD at no more than CGA to better be able to play with grandchildren without  falling.  Baseline:  Goal status: INITIAL  4.  Pt to demonstrate improved gait mechanics with LRAD for 150' with minimal compensate strategies to I with pt safety with ambulating into a store.  Baseline:  Goal status: INITIAL  5.  Pt to demonstrate ability to perform x5 Sit to stand with at least 10# to more safely assist in caring for grandchildren without risk of falling.  Baseline:  Goal status: INITIAL    PLAN:  PT FREQUENCY: 2x/week  PT DURATION: 12 weeks  PLANNED INTERVENTIONS:    97110-Therapeutic exercises, 97530- Therapeutic activity, [id number]- Neuromuscular re-education, 97535- Self Care, [id number]- Manual therapy, [id number]- Gait training, [phone]- Canalith repositioning, [id number]- Aquatic Therapy, [phone]- Splinting, [id number]- Electrical stimulation (manual), [id number]- Vasopneumatic device, [id number]- Ultrasound, [id number]- Traction (mechanical), [id number]- Ionotophoresis 4mg /ml Dexamethasone , Patient/Family education, Balance training, Stair training, Taping, Dry Needling, Joint mobilization, Joint manipulation, Spinal manipulation, Spinal mobilization, Scar mobilization, DME instructions, Wheelchair mobility training, Cryotherapy, Moist heat, and Biofeedback  NEXT VISIT: for aquatic PT, core and LE strengthening and neuromuscular re-ed  for LAND: continue with resisted walking, focused standing glute med activities, hip ADDuctors and flexors   Mliss Cummins, PT 05/05/24 10:47 AM

## 2024-05-05 ENCOUNTER — Ambulatory Visit: Admitting: Physical Therapy

## 2024-05-05 ENCOUNTER — Encounter: Payer: Self-pay | Admitting: Physical Therapy

## 2024-05-05 ENCOUNTER — Other Ambulatory Visit: Payer: Self-pay | Admitting: Family Medicine

## 2024-05-05 DIAGNOSIS — M6281 Muscle weakness (generalized): Secondary | ICD-10-CM

## 2024-05-05 DIAGNOSIS — F411 Generalized anxiety disorder: Secondary | ICD-10-CM

## 2024-05-05 DIAGNOSIS — R293 Abnormal posture: Secondary | ICD-10-CM

## 2024-05-05 DIAGNOSIS — R269 Unspecified abnormalities of gait and mobility: Secondary | ICD-10-CM

## 2024-05-06 ENCOUNTER — Encounter (INDEPENDENT_AMBULATORY_CARE_PROVIDER_SITE_OTHER): Payer: Self-pay

## 2024-05-07 ENCOUNTER — Encounter: Payer: Self-pay | Admitting: Physical Therapy

## 2024-05-07 ENCOUNTER — Ambulatory Visit: Admitting: Physical Therapy

## 2024-05-07 DIAGNOSIS — M6281 Muscle weakness (generalized): Secondary | ICD-10-CM

## 2024-05-07 DIAGNOSIS — R269 Unspecified abnormalities of gait and mobility: Secondary | ICD-10-CM

## 2024-05-07 DIAGNOSIS — R293 Abnormal posture: Secondary | ICD-10-CM

## 2024-05-07 DIAGNOSIS — R279 Unspecified lack of coordination: Secondary | ICD-10-CM

## 2024-05-07 DIAGNOSIS — M62838 Other muscle spasm: Secondary | ICD-10-CM

## 2024-05-07 NOTE — Telephone Encounter (Signed)
 Think she is talking about the Ehlers-Danlos panel.  It says that they are supposed to send us  a PDF thus with the result note says.  I have not seen anything come across separately for this.  So I would say lets give them a call

## 2024-05-07 NOTE — Telephone Encounter (Signed)
 Called [facility]- spoke with Ezzie-  Will fax this PDF to attn Luke- states will be 4 pages once received ( one is a cover letter)

## 2024-05-07 NOTE — Therapy (Signed)
 OUTPATIENT PHYSICAL THERAPY FEMALE PELVIC AND ORTHO TREATMENT   Patient Name: Anna Ramsey MRN: [id number] DOB:[birth year], 58 y.o., female Today's Date: 05/07/2024  END OF SESSION:  PT End of Session - 05/07/24 0841     Visit Number 25    Date for PT Re-Evaluation 07/17/24    Authorization Type [employer]    PT Start Time 0840    PT Stop Time 0934    PT Time Calculation (min) 54 min    Activity Tolerance Patient tolerated treatment well    Behavior During Therapy [facility] for tasks assessed/performed              Past Medical History:  Diagnosis Date   ADHD (attention deficit hyperactivity disorder) 2024   Dysuria    Erythematous bladder mucosa    Heart murmur    asymptomatic per pt --  no echo   Hematuria    History of cervical cancer    12/ 2014  Stage IB2--  s/p  TAH w/ BSO and pelvic lymphadectomy/  and radiation therapy   History of radiation therapy 10/20/13-11/28/13   pelvis 50.4 gray   Hypertension    Lesion of bladder    Mild intermittent asthma    Neuromuscular disorder (HCC)    Seasonal allergies    Thyroid disease    Past Surgical History:  Procedure Laterality Date   CYSTOSCOPY WITH BIOPSY N/A 04/29/2015   Procedure: CYSTOSCOPY WITH BIOPSY WITH FULGERATION;  Surgeon: Belvie LITTIE Clara, MD;  Location: [facility];  Service: Urology;  Laterality: N/A;  1 HR [phone] [id number]   FOOT SURGERY Left 2010   RADICAL ABDOMINAL HYSTERECTOMY  09-05-2013   w/ BILATERAL SALPINGOOPHORECTOMY AND PELVIC LYMPHADECTOMY   Patient Active Problem List   Diagnosis Date Noted   ADD (attention deficit disorder) 04/21/2024   SUI (stress urinary incontinence, female) 11/01/2023   History of urinary retention 11/01/2023   Neurogenic bladder 10/18/2023   Hematuria 10/18/2023   Acute urinary retention 10/18/2023   Stress and adjustment reaction 01/13/2021   Inattention 10/28/2019   Hypothyroid 11/13/2016   Neuropathy involving both lower  extremities 11/13/2016   Cystitis, radiation 05/22/2014   Cervical cancer (HCC) 09/01/2013   Adenocarcinoma of cervix, 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   Asthma, mild intermittent 07/07/2009   INSOMNIA 05/28/2008   Generalized anxiety disorder 05/07/2008   HYPERTENSION, BENIGN 05/31/2007    PCP: Geraline Dorothyann JONETTA, MD  REFERRING PROVIDER: Skeet Juliene SAUNDERS, DO   REFERRING DIAG:  [phone] (ICD-10-CM) - Bilateral leg weakness G62.89 (ICD-10-CM) - 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 I am so thankful she could fix the vertigo!      Eval: Right leg worse than left but both painful. Right has instances of knee just giving out, has neuropathy - like weakness, pain since radiation in 2015. The nerves have been affected and greatly limiting her activity. Does have symptoms of numbness, sensory deficits, weakness, ataxic gait, and painful but all vary based on the day.     Fluid intake: Yes: water    PAIN:  Are you having pain? No NPRS scale:0 Pain location: bil legs Right leg  Pain type: aching Pain description: constant   Aggravating factors: walking, end of day, rainy days Relieving factors: try not to walk, decreasing activities    PRECAUTIONS: Other: cervical cancer with radiation  RED FLAGS: None   WEIGHT BEARING RESTRICTIONS: No  FALLS:  Has patient fallen in last 6 months? Yes.  Number of falls 1x/weekly due to weakness and decreased balance from having the radiation, trouble with low light - reports she does need to furniture walk to help with balance  LIVING ENVIRONMENT: Lives with: lives with their family  OCCUPATION: sits with laptop on her lap for 12 hours per day; unable to sit with legs down due to them falling asleep  PLOF: Independent  PATIENT GOALS: to strengthening enough to walk across grass, wants to have better balance, stronger legs                                        PERTINENT HISTORY:  ADHD; Cervical Cancer 2014; 9 weeks of radiation; Radical hysterectomy;    OBJECTIVE:  Note: Objective measures were completed at Evaluation unless otherwise noted.  DIAGNOSTIC FINDINGS:   PATIENT SURVEYS:  Lower Extremity Functional Score: 5 / 80 = 6.3 %  COGNITION: Overall cognitive status: Within functional limits for tasks assessed     SENSATION: Poor sensation at Rt LE - light touch felt along posterior gastroc only no awareness without visualization of being touched at medial, lateral, anterior lower leg, reports numbness in bil feet Rt>Lt  EDEMA:  Rt ankle sometimes swells per pt  MUSCLE LENGTH: Bil hamstrings and adductors limited by 25%;  POSTURE: rounded shoulders and forward head  LOWER EXTREMITY ROM:  weakness and poor coordination/proprioception   LOWER EXTREMITY MMT:  02/04/24 hip flexion was R 3/5, L 4/5  MMT Right Eval */5 Left Eval */5 Right 5/16 Left 5/16 RIGHT 6/6 LEFT  6/6  Hip flexion 2 3 4+ 5 3   Hip extension 2 3 4+ 4 4+ 4  Hip abduction 3 3+ 5- 5 5   Hip adduction 1+ 2+ 2+ 2+ 2+ 2+  Hip internal rotation        Hip external rotation        Knee flexion 2 3 3- 4 3+ 5  Knee extension 2+ 3 3- 5 3+   Ankle dorsiflexion 0 4 0 4+ long sitting 2 4  4+ long sitting 2    Ankle plantarflexion 3 4      Ankle inversion 1 4 3+ tested in long sitting 4+ tested in long sitting 3+ in long sitting 5 in long sitting   Ankle eversion 2 4 3+ tested in long sitting 4- tested in long sitting 2+ 4- tested in long sitting   (Blank rows = not tested)  FUNCTIONAL TESTS:  Five times Sit to Stand Test (FTSS) Method: Use a straight back chair with a solid seat that is 16-18" high. Ask participant to sit on the chair with arms folded across their chest.   Instructions: "Stand up and sit down as quickly as possible 5 times, keeping your arms folded across your chest."   Measurement: Stop timing when the participant stands the 5th time.  TIME: ______ (in seconds)  Times > 13.6 seconds is associated with increased disability and morbidity (Guralnik, 2000) Times > 15 seconds is predictive of recurrent falls in healthy individuals aged 12 and older (Buatois, et al., 2008) Normal performance values in community dwelling individuals aged 64 and older (Bohannon, 2006): 50-59 years: 8 seconds 60-69 years: 11.4 seconds 70-79 years: 12.6 seconds 80-89 years: 14.8 seconds  MCID: >= 2.3 seconds for Vestibular Disorders (Meretta, 2006)  5 times sit to stand: 22s with hands - very unstable with poor hip, posture, core control  Timed up and go (TUG): 28s with SBQC - min A  GAIT: Distance walked: 200' Assistive device utilized: Quad cane small base Level of assistance: CGA Comments: Rt LE demonstrating ataxic pattern, Lt trunk lean intermittently  to right self, poor step height bil, decreased stride length, limited glute activation                                                                                                                                 TREATMENT DATE:    05/07/24;Pt arrives for aquatic physical therapy. Treatment took place in 3.5-5.5 feet of water . Water  temperature was 91 degrees F. Pt entered the pool via stairs with heavy use of railings, step to step:. Pt requires buoyancy of water  for support and to offload joints with strengthening exercises.  Seated water  bench with 75% submersion Pt  performed seated LE AROM exercises 20x2 in all planes, concurrent discussion of current status. Added 3.5# ankle wts for RTLE exercises. 75% depth water  walking 6 lengths in each direction, reduced assistance level to Qtip for balance and weight added to RT ankle to increase proprioception. Pt kept ankle weight on for remainder of session. Standing slow marching 20x Bil, ham curls Bil 20x  and hip abduction 20x  holding onto wall. Standing single leg stance 6x 20 sec holding Qtip: VC to really focus on pushing foot into floor and growing taller in her leg to activate her glutes better.. Seated decompression with large noodle behind patient to rest low back 2 min 2x bouts. Prone float with large noodle for bil quad stretch, RT hip hike; difficult to get correct muscle action until last 10-ish 2x10.   05/05/24: Nu step seat 4,UE 9, Lev 5 x 6 min - used yellow Tband to aid in correct alignment Seated clam with yellow band x 10 , then unilateral L only (isometric hold R) x  Seated yellow tied loop at knees arms locked with PT's - sit to stand  x5 reps focusing on Rt knee control and equal WB bil LE Supine yellow tied loop at knees - pt experienced vertigo (Epley maneuver administered) Quadruped hip ext toe slides alt LE x6 total Modified plank knees and elbows max hold - multiple reps S/L on elbow with small ball adductor press into ball x10 R SL knee extension with adductor press into ball under knee R S/L hips at 45 deg: hip ABD, hip flex to ext in ABD, toe and heel taps front and back Happy baby Discussion of her current status today and the difficulties when she has bad days       [facility] Adult PT Treatment:                                                DATE: 05/02/24 Pt seen for aquatic therapy today.  Treatment took place in water  3.5-4.75 ft in depth at the [facility] pool. Temp of water  was 91.  Pt entered/exited the pool via stairs using step to pattern with hand rail.  - walking  forward and back ue support barbell 3.6 ft -seated on lift with 2.5 lb blue ankle weight donned rle: LAQ; hip add/abd.  Worn for remainder of session -return to walking with 2.5 lb forward back and side stepping  -seated : cycling quick fatigue -Tra engagement/balance retraining: 1/2 noodle pull down wide stance then staggered x 5 reps ea.  Vc and demonstration for execution. Requires multiple trials to create motor plan.  Good execution and toleration -Side stepping 2 widths ue support barbell   Pt requires the buoyancy and hydrostatic pressure of water  for support, and to offload joints by unweighting joint load by at least 50 % in navel deep water  and by at least 75-80% in chest to neck deep water .  Viscosity of the water  is needed for resistance of strengthening. Water  current perturbations provides challenge to standing balance requiring increased core activation.       PATIENT EDUCATION:  Education details: [email or web address] - uses App Person educated: Patient Education method: Explanation, Demonstration, Tactile cues, Verbal cues, and Handouts Education comprehension: verbalized understanding, returned demonstration, verbal cues required, tactile cues required, and needs further education  HOME EXERCISE PROGRAM: Access Code: [email or web address] URL: [email or web address] Date: 04/07/2024 Prepared by: Mliss  Exercises - Supine Heel Slide with Strap  - 1 x daily - 7 x weekly - 3 sets - 10 reps - Pillow squeezes with kegel  - 1 x daily - 7 x weekly - 3 sets - 10 reps - Sit to Stand with Counter Support  - 1 x daily - 7 x weekly - 1 sets - 10 reps - Seated Long Arc Quad  - 1 x daily - 7 x weekly - 1-3 sets - 10 reps - 5 sec hold - Seated Isometric Hip Abduction with Resistance  - 1 x daily - 7 x weekly - 1-3 sets - 10 reps - Heel Raises with Counter Support  - 1 x daily - 7 x weekly - 1-3 sets - 10 reps - Standing Terminal Knee Extension at Wall with Ball  - 2 x daily - 7 x weekly - 2  sets - 10 reps - 5 sec hold - Squat with Chair Touch and Resistance Loop  - 1 x daily - 3 x weekly - 2 sets - 10 reps - Standing Knee  Flexion with Counter Support  - 1 x daily - 3 x weekly - 2 sets - 10 reps - Supine Bridge with Resistance Band  - 1 x daily - 7 x weekly - 1-2 sets - 10 reps - 5 sec hold - Standing Hip Abduction on Slider  - 1 x daily - 3 x weekly - 2 sets - 10 reps - Calf stretch on rocker board  - 1 x daily - 7 x weekly - 1 sets - 3 reps - 30 sec  hold - Standing Anti-Rotation Press with Anchored Resistance  - 1 x daily - 3 x weekly - 2 sets - 10 reps - Standing Trunk Rotation with Resistance  - 1 x daily - 3 x weekly - 2 sets - 10 reps  ASSESSMENT:  CLINICAL IMPRESSION: Pt returns to aquatic PT on a good day. Pt demonstrated some repeated RT ankle dorsiflexion today when walking forward. Pt reports she felt very steady today. Pt did not wear her ankle AFO and was still able to demonstrate some ankle DF ambulating out of the pool entering her treatment today.    Eval: Patient is a 57 y.o. female  who was seen today for physical therapy evaluation and treatment for poor gait mechanics, impaired posture, impaired balance, decreased hip and core strength, increased fall risk. Pt has complex history of  Radiation-induced lumbosacral plexopathy and Polyradiculoneuropathy status post cervical cancer. Pt has completed pelvic floor PT recently and had positive outcomes with this and resolution of symptoms. Pt very motivated to participate in PT for bil LE strengthening and decreased fall risk. Pt demonstrated impairments with objective measurements of TUG and 5xSTS indicative of increased fall risk and balance deficits. Pt reports she is unable to hold and carry grandchildren, to walk on uneven surfaces, needs assistance with dressing, walking, transfers sometimes and wants to be more I. Pt would benefit from additional PT to further address deficits.    OBJECTIVE IMPAIRMENTS: Abnormal  gait, decreased activity tolerance, decreased coordination, decreased endurance, decreased mobility, difficulty walking, decreased strength, increased fascial restrictions, increased muscle spasms, impaired flexibility, impaired sensation, improper body mechanics, postural dysfunction, and pain.   ACTIVITY LIMITATIONS: carrying, lifting, bending, sitting, standing, squatting, stairs, transfers, bed mobility, locomotion level, and caring for others  PARTICIPATION LIMITATIONS: meal prep, cleaning, laundry, interpersonal relationship, driving, shopping, community activity, occupation, and yard work  PERSONAL FACTORS: Fitness, Past/current experiences, Time since onset of injury/illness/exacerbation, and 1 comorbidity: medical history are also affecting patient's functional outcome.   REHAB POTENTIAL: Good  CLINICAL DECISION MAKING: Evolving/moderate complexity  EVALUATION COMPLEXITY: Moderate   GOALS: Goals reviewed with patient? Yes  SHORT TERM GOALS: Target date: 02/13/24 Pt to be I with HEP for carry over and continuing recommendations for improved outcomes.   Baseline: Goal status: MET  2.  Pt to demonstrate at least 3/5 Rt LE strength grossly and 4/5 Lt for improved pelvic stability and functional squats without falling.  Baseline:  Goal status: IPARTIALLY MET 03/03/24  3.  Pt to demonstrate ability to complete static standing without AD for at least 5 mins at midline to complete gentle kitchen tasks/household tasks without fear of falling.  Baseline:  Goal status: MET  4.  Pt to demonstrate I with dynamic sitting balance to better be able to reach floor level for retrieving fallen objects and putting on socks without falling Baseline:  Goal status: MET 03/03/24  LONG TERM GOALS: Target date: 07/17/24  Pt to be I with advanced HEP for carry  over and continuing recommendations for improved outcomes.   Baseline:  Goal status: INITIAL  2.  Pt to report improved LEFS score to at  least 30/80 for improved I with functional tasks at home such as dressing, walking from room to room.  Baseline:  Goal status: INITIAL  3.  Pt to demonstrate improved dynamic standing balance with or without AD at no more than CGA to better be able to play with grandchildren without falling.  Baseline:  Goal status: INITIAL  4.  Pt to demonstrate improved gait mechanics with LRAD for 150' with minimal compensate strategies to I with pt safety with ambulating into a store.  Baseline:  Goal status: INITIAL  5.  Pt to demonstrate ability to perform x5 Sit to stand with at least 10# to more safely assist in caring for grandchildren without risk of falling.  Baseline:  Goal status: INITIAL    PLAN:  PT FREQUENCY: 2x/week  PT DURATION: 12 weeks  PLANNED INTERVENTIONS:    [id number]-Therapeutic exercises, 97530- Therapeutic activity, [id number]- Neuromuscular re-education, 97535- Self Care, [id number]- Manual therapy, [phone]- Gait training, [phone]- Canalith repositioning, [id number]- Aquatic Therapy, [phone]- Splinting, [id number]- Electrical stimulation (manual), [id number]- Vasopneumatic device, [id number]- Ultrasound, [id number]- Traction (mechanical), [id number]- Ionotophoresis 4mg /ml Dexamethasone , Patient/Family education, Balance training, Stair training, Taping, Dry Needling, Joint mobilization, Joint manipulation, Spinal manipulation, Spinal mobilization, Scar mobilization, DME instructions, Wheelchair mobility training, Cryotherapy, Moist heat, and Biofeedback  NEXT VISIT: for aquatic PT, core and LE strengthening and neuromuscular re-ed  for LAND: continue with resisted walking, focused standing glute med activities, hip ADDuctors and flexors   Delon Darner, PTA 05/07/24 9:34 AM

## 2024-05-12 ENCOUNTER — Ambulatory Visit: Admitting: Physical Therapy

## 2024-05-14 ENCOUNTER — Encounter: Payer: Self-pay | Admitting: Physical Therapy

## 2024-05-14 ENCOUNTER — Ambulatory Visit: Admitting: Physical Therapy

## 2024-05-14 DIAGNOSIS — R293 Abnormal posture: Secondary | ICD-10-CM

## 2024-05-14 DIAGNOSIS — M62838 Other muscle spasm: Secondary | ICD-10-CM

## 2024-05-14 DIAGNOSIS — R269 Unspecified abnormalities of gait and mobility: Secondary | ICD-10-CM

## 2024-05-14 DIAGNOSIS — R279 Unspecified lack of coordination: Secondary | ICD-10-CM

## 2024-05-14 DIAGNOSIS — M6281 Muscle weakness (generalized): Secondary | ICD-10-CM

## 2024-05-14 NOTE — Therapy (Signed)
 OUTPATIENT PHYSICAL THERAPY FEMALE PELVIC AND ORTHO TREATMENT   Patient Name: Anna Ramsey MRN: [id number] DOB:12-08-1965, 58 y.o., female Today's Date: 05/14/2024  END OF SESSION:  PT End of Session - 05/14/24 0800     Visit Number 26    Date for PT Re-Evaluation 07/17/24    Authorization Type [employer]    PT Start Time 0800    PT Stop Time 0845    PT Time Calculation (min) 45 min    Activity Tolerance Patient tolerated treatment well    Behavior During Therapy [facility] for tasks assessed/performed              Past Medical History:  Diagnosis Date   ADHD (attention deficit hyperactivity disorder) 2024   Dysuria    Erythematous bladder mucosa    Heart murmur    asymptomatic per pt --  no echo   Hematuria    History of cervical cancer    12/ 2014  Stage IB2--  s/p  TAH w/ BSO and pelvic lymphadectomy/  and radiation therapy   History of radiation therapy 10/20/13-11/28/13   pelvis 50.4 gray   Hypertension    Lesion of bladder    Mild intermittent asthma    Neuromuscular disorder (HCC)    Seasonal allergies    Thyroid disease    Past Surgical History:  Procedure Laterality Date   CYSTOSCOPY WITH BIOPSY N/A 04/29/2015   Procedure: CYSTOSCOPY WITH BIOPSY WITH FULGERATION;  Surgeon: Belvie LITTIE Clara, MD;  Location: [facility];  Service: Urology;  Laterality: N/A;  1 HR [phone] [id number]   FOOT SURGERY Left 2010   RADICAL ABDOMINAL HYSTERECTOMY  09-05-2013   w/ BILATERAL SALPINGOOPHORECTOMY AND PELVIC LYMPHADECTOMY   Patient Active Problem List   Diagnosis Date Noted   ADD (attention deficit disorder) 04/21/2024   SUI (stress urinary incontinence, female) 11/01/2023   History of urinary retention 11/01/2023   Neurogenic bladder 10/18/2023   Hematuria 10/18/2023   Acute urinary retention 10/18/2023   Stress and adjustment reaction 01/13/2021   Inattention 10/28/2019   Hypothyroid 11/13/2016   Neuropathy involving both lower  extremities 11/13/2016   Cystitis, radiation 05/22/2014   Cervical cancer (HCC) 09/01/2013   Adenocarcinoma of cervix, 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   Asthma, mild intermittent 07/07/2009   INSOMNIA 05/28/2008   Generalized anxiety disorder 05/07/2008   HYPERTENSION, BENIGN 05/31/2007    PCP: Geraline Dorothyann JONETTA, MD  REFERRING PROVIDER: Skeet Juliene SAUNDERS, DO   REFERRING DIAG:  [phone] (ICD-10-CM) - Bilateral leg weakness G62.89 (ICD-10-CM) - 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    Eval: Right leg worse than left but both painful. Right has instances of knee just giving out, has neuropathy - like weakness, pain since radiation in 2015. The nerves have been affected and greatly limiting her activity. Does have symptoms of numbness, sensory deficits, weakness, ataxic gait, and painful but all vary based on the day.     Fluid intake: Yes: water    PAIN:  Are you having pain? No NPRS scale:0 Pain location: bil legs Right leg  Pain type: aching Pain description: constant   Aggravating factors: walking, end of day, rainy days Relieving factors: try not to walk, decreasing activities    PRECAUTIONS: Other: cervical cancer with radiation  RED FLAGS: None   WEIGHT BEARING RESTRICTIONS: No  FALLS:  Has patient fallen in last 6 months? Yes. Number of falls 1x/weekly due to weakness  and decreased balance from having the radiation, trouble with low light - reports she does need to furniture walk to help with balance  LIVING ENVIRONMENT: Lives with: lives with their family  OCCUPATION: sits with laptop on her lap for 12 hours per day; unable to sit with legs down due to them falling asleep  PLOF: Independent  PATIENT GOALS: to strengthening enough to walk across grass, wants to have better balance, stronger legs                                        PERTINENT HISTORY:  ADHD; Cervical Cancer 2014; 9 weeks of radiation; Radical hysterectomy;    OBJECTIVE:  Note: Objective measures were completed at Evaluation unless otherwise noted.  DIAGNOSTIC FINDINGS:   PATIENT SURVEYS:  Lower Extremity Functional Score: 5 / 80 = 6.3 %  COGNITION: Overall cognitive status: Within functional limits for tasks assessed     SENSATION: Poor sensation at Rt LE - light touch felt along posterior gastroc only no awareness without visualization of being touched at medial, lateral, anterior lower leg, reports numbness in bil feet Rt>Lt  EDEMA:  Rt ankle sometimes swells per pt  MUSCLE LENGTH: Bil hamstrings and adductors limited by 25%;  POSTURE: rounded shoulders and forward head  LOWER EXTREMITY ROM:  weakness and poor coordination/proprioception   LOWER EXTREMITY MMT:  02/04/24 hip flexion was R 3/5, L 4/5  MMT Right Eval */5 Left Eval */5 Right 5/16 Left 5/16 RIGHT 6/6 LEFT  6/6  Hip flexion 2 3 4+ 5 3   Hip extension 2 3 4+ 4 4+ 4  Hip abduction 3 3+ 5- 5 5   Hip adduction 1+ 2+ 2+ 2+ 2+ 2+  Hip internal rotation        Hip external rotation        Knee flexion 2 3 3- 4 3+ 5  Knee extension 2+ 3 3- 5 3+   Ankle dorsiflexion 0 4 0 4+ long sitting 2 4  4+ long sitting 2    Ankle plantarflexion 3 4      Ankle inversion 1 4 3+ tested in long sitting 4+ tested in long sitting 3+ in long sitting 5 in long sitting  Ankle eversion 2 4 3+ tested in long sitting  4- tested in long sitting 2+ 4- tested in long sitting   (Blank rows = not tested)   FUNCTIONAL TESTS:  Five times Sit to Stand Test (FTSS) Method: Use a  straight back chair with a solid seat that is 16-18" high. Ask participant to sit on the chair with arms folded across their chest.   Instructions: "Stand up and sit down as quickly as possible 5 times, keeping your arms folded across your chest."   Measurement: Stop timing when the participant stands the 5th time.  TIME: ______ (in seconds)  Times > 13.6 seconds is associated with increased disability and morbidity (Guralnik, 2000) Times > 15 seconds is predictive of recurrent falls in healthy individuals aged 62 and older (Buatois, et al., 2008) Normal performance values in community dwelling individuals aged 32 and older (Bohannon, 2006): 50-59 years: 8 seconds 60-69 years: 11.4 seconds 70-79 years: 12.6 seconds 80-89 years: 14.8 seconds  MCID: >= 2.3 seconds for Vestibular Disorders (Meretta, 2006)  5 times sit to stand: 22s with hands - very unstable with poor hip, posture, core control  Timed up and go (TUG): 28s with SBQC - min A  GAIT: Distance walked: 200' Assistive device utilized: Counselling psychologist Level of assistance: CGA Comments: Rt LE demonstrating ataxic pattern, Lt trunk lean intermittently  to right self, poor step height bil, decreased stride length, limited glute activation                                                                                                                                 TREATMENT DATE:   05/14/24:Pt arrives for aquatic physical therapy. Treatment took place in 3.5-5.5 feet of water . Water  temperature was 91 degrees F. Pt entered the pool via stairs with heavy use of railings, step to step:. Pt requires buoyancy of water  for support and to offload joints with strengthening exercises.  Seated water  bench with 75% submersion Pt performed seated LE AROM exercises 20x2 in all  planes, concurrent discussion of current status. Added 3.5# ankle wts for RTLE exercises. 75% depth water  walking 6 lengths in each direction, reduced assistance level to Qtip for balance and weight added to RT ankle to increase proprioception. Pt kept ankle weight on for remainder of session. Standing SLS on the RT: LTLE swing forward and back 3x10, pt could complete without UE support. SLS RT with UE perturbations 3x10 again no UE support required. Low back stretching to complete session.    05/07/24;Pt arrives for aquatic physical therapy. Treatment took place in 3.5-5.5 feet of water . Water  temperature was 91 degrees F. Pt entered the pool via stairs with heavy use of railings, step to step:. Pt requires buoyancy of water  for support and to offload joints with strengthening exercises.  Seated water  bench with 75% submersion Pt performed seated LE AROM exercises 20x2 in all planes, concurrent discussion of current status. Added 3.5# ankle wts for RTLE exercises. 75% depth water  walking 6 lengths in each direction, reduced assistance level to Qtip for balance and weight added to RT ankle to increase proprioception. Pt kept ankle weight on for remainder of session. Standing slow marching  20x Bil, ham curls Bil 20x  and hip abduction 20x  holding onto wall. Standing single leg stance 6x 20 sec holding Qtip: VC to really focus on pushing foot into floor and growing taller in her leg to activate her glutes better.. Seated decompression with large noodle behind patient to rest low back 2 min 2x bouts. Prone float with large noodle for bil quad stretch, RT hip hike; difficult to get correct muscle action until last 10-ish 2x10.   05/05/24: Nu step seat 4,UE 9, Lev 5 x 6 min - used yellow Tband to aid in correct alignment Seated clam with yellow band x 10 , then unilateral L only (isometric hold R) x  Seated yellow tied loop at knees arms locked with PT's - sit to stand x5 reps focusing on Rt knee control and  equal WB bil LE Supine yellow tied loop at knees - pt experienced vertigo (Epley maneuver administered) Quadruped hip ext toe slides alt LE x6 total Modified plank knees and elbows max hold - multiple reps S/L on elbow with small ball adductor press into ball x10 R SL knee extension with adductor press into ball under knee R S/L hips at 45 deg: hip ABD, hip flex to ext in ABD, toe and heel taps front and back Happy baby Discussion of her current status today and the difficulties when she has bad days        PATIENT EDUCATION:  Education details: [email or web address] - uses App Person educated: Patient Education method: Explanation, Demonstration, Tactile cues, Verbal cues, and Handouts Education comprehension: verbalized understanding, returned demonstration, verbal cues required, tactile cues required, and needs further education  HOME EXERCISE PROGRAM: Access Code: [email or web address] URL: [email or web address] Date: 04/07/2024 Prepared by: Mliss  Exercises - Supine Heel Slide with Strap  - 1 x daily - 7 x weekly - 3 sets - 10 reps - Pillow squeezes with kegel  - 1 x daily - 7 x weekly - 3 sets - 10 reps - Sit to Stand with Counter Support  - 1 x daily - 7 x weekly - 1 sets - 10 reps - Seated Long Arc Quad  - 1 x daily - 7 x weekly - 1-3 sets - 10 reps - 5 sec hold - Seated Isometric Hip Abduction with Resistance  - 1 x daily - 7 x weekly - 1-3 sets - 10 reps - Heel Raises with Counter Support  - 1 x daily - 7 x weekly - 1-3 sets - 10 reps - Standing Terminal Knee Extension at Wall with Ball  - 2 x daily - 7 x weekly - 2 sets - 10 reps - 5 sec hold - Squat with Chair Touch and Resistance Loop  - 1 x daily - 3 x weekly - 2 sets - 10 reps - Standing Knee Flexion with Counter Support  - 1 x daily - 3 x weekly - 2 sets - 10 reps - Supine Bridge with Resistance Band  - 1 x daily - 7 x weekly - 1-2 sets - 10 reps - 5 sec hold - Standing Hip Abduction on Slider  - 1 x daily - 3 x weekly - 2  sets - 10 reps - Calf stretch on rocker board  - 1 x daily - 7 x weekly - 1 sets - 3 reps - 30 sec  hold - Standing Anti-Rotation Press with Anchored Resistance  - 1 x daily - 3 x weekly - 2 sets - 10 reps - Standing Trunk  Rotation with Resistance  - 1 x daily - 3 x weekly - 2 sets - 10 reps  ASSESSMENT:  CLINICAL IMPRESSION: Pt able to demonstrate very good single leg stance stability on her RTLE today without the need for any UE assistance. No pain reported.  Eval: Patient is a 58 y.o. female  who was seen today for physical therapy evaluation and tr in the teatment for poor gait mechanics, impaired posture, impaired balance, decreased hip and core strength, increased fall risk. Pt has complex history of  Radiation-induced lumbosacral plexopathy and Polyradiculoneuropathy status post cervical cancer. Pt has completed pelvic floor PT recently and had positive outcomes with this and resolution of symptoms. Pt very motivated to participate in PT for bil LE strengthening and decreased fall risk. Pt demonstrated impairments with objective measurements of TUG and 5xSTS indicative of increased fall risk and balance deficits. Pt reports she is unable to hold and carry grandchildren, to walk on uneven surfaces, needs assistance with dressing, walking, transfers sometimes and wants to be more I. Pt would benefit from additional PT to further address deficits.    OBJECTIVE IMPAIRMENTS: Abnormal gait, decreased activity tolerance, decreased coordination, decreased endurance, decreased mobility, difficulty walking, decreased strength, increased fascial restrictions, increased muscle spasms, impaired flexibility, impaired sensation, improper body mechanics, postural dysfunction, and pain.   ACTIVITY LIMITATIONS: carrying, lifting, bending, sitting, standing, squatting, stairs, transfers, bed mobility, locomotion level, and caring for others  PARTICIPATION LIMITATIONS: meal prep, cleaning, laundry, interpersonal  relationship, driving, shopping, community activity, occupation, and yard work  PERSONAL FACTORS: Fitness, Past/current experiences, Time since onset of injury/illness/exacerbation, and 1 comorbidity: medical history are also affecting patient's functional outcome.   REHAB POTENTIAL: Good  CLINICAL DECISION MAKING: Evolving/moderate complexity  EVALUATION COMPLEXITY: Moderate   GOALS: Goals reviewed with patient? Yes  SHORT TERM GOALS: Target date: 02/13/24 Pt to be I with HEP for carry over and continuing recommendations for improved outcomes.   Baseline: Goal status: MET  2.  Pt to demonstrate at least 3/5 Rt LE strength grossly and 4/5 Lt for improved pelvic stability and functional squats without falling.  Baseline:  Goal status: IPARTIALLY MET 03/03/24  3.  Pt to demonstrate ability to complete static standing without AD for at least 5 mins at midline to complete gentle kitchen tasks/household tasks without fear of falling.  Baseline:  Goal status: MET  4.  Pt to demonstrate I with dynamic sitting balance to better be able to reach floor level for retrieving fallen objects and putting on socks without falling Baseline:  Goal status: MET 03/03/24  LONG TERM GOALS: Target date: 07/17/24  Pt to be I with advanced HEP for carry over and continuing recommendations for improved outcomes.   Baseline:  Goal status: INITIAL  2.  Pt to report improved LEFS score to at least 30/80 for improved I with functional tasks at home such as dressing, walking from room to room.  Baseline:  Goal status: INITIAL  3.  Pt to demonstrate improved dynamic standing balance with or without AD at no more than CGA to better be able to play with grandchildren without falling.  Baseline:  Goal status: INITIAL  4.  Pt to demonstrate improved gait mechanics with LRAD for 150' with minimal compensate strategies to I with pt safety with ambulating into a store.  Baseline:  Goal status: INITIAL  5.  Pt  to demonstrate ability to perform x5 Sit to stand with at least 10# to more safely assist in caring for grandchildren  without risk of falling.  Baseline:  Goal status: INITIAL    PLAN:  PT FREQUENCY: 2x/week  PT DURATION: 12 weeks  PLANNED INTERVENTIONS:    97110-Therapeutic exercises, 97530- Therapeutic activity, [id number]- Neuromuscular re-education, 97535- Self Care, [id number]- Manual therapy, [phone]- Gait training, [phone]- Canalith repositioning, [id number]- Aquatic Therapy, [phone]- Splinting, [id number]- Electrical stimulation (manual), [id number]- Vasopneumatic device, [id number]- Ultrasound, [id number]- Traction (mechanical), [id number]- Ionotophoresis 4mg /ml Dexamethasone , Patient/Family education, Balance training, Stair training, Taping, Dry Needling, Joint mobilization, Joint manipulation, Spinal manipulation, Spinal mobilization, Scar mobilization, DME instructions, Wheelchair mobility training, Cryotherapy, Moist heat, and Biofeedback  NEXT VISIT: for aquatic PT, core and LE strengthening and neuromuscular re-ed  for LAND: continue with resisted walking, focused standing glute med activities, hip ADDuctors and flexors   Delon Darner, PTA 05/14/24 12:50 PM

## 2024-05-19 ENCOUNTER — Ambulatory Visit (INDEPENDENT_AMBULATORY_CARE_PROVIDER_SITE_OTHER): Admitting: Physical Therapy

## 2024-05-19 ENCOUNTER — Encounter: Payer: Self-pay | Admitting: Obstetrics

## 2024-05-19 ENCOUNTER — Ambulatory Visit (INDEPENDENT_AMBULATORY_CARE_PROVIDER_SITE_OTHER): Payer: 59 | Admitting: Obstetrics

## 2024-05-19 VITALS — BP 135/91 | HR 70

## 2024-05-19 DIAGNOSIS — Z87448 Personal history of other diseases of urinary system: Secondary | ICD-10-CM

## 2024-05-19 DIAGNOSIS — N3281 Overactive bladder: Secondary | ICD-10-CM

## 2024-05-19 DIAGNOSIS — N3289 Other specified disorders of bladder: Secondary | ICD-10-CM

## 2024-05-19 DIAGNOSIS — N319 Neuromuscular dysfunction of bladder, unspecified: Secondary | ICD-10-CM

## 2024-05-19 DIAGNOSIS — R269 Unspecified abnormalities of gait and mobility: Secondary | ICD-10-CM

## 2024-05-19 DIAGNOSIS — Z87898 Personal history of other specified conditions: Secondary | ICD-10-CM

## 2024-05-19 DIAGNOSIS — R293 Abnormal posture: Secondary | ICD-10-CM

## 2024-05-19 DIAGNOSIS — R350 Frequency of micturition: Secondary | ICD-10-CM

## 2024-05-19 DIAGNOSIS — M6281 Muscle weakness (generalized): Secondary | ICD-10-CM

## 2024-05-19 MED ORDER — GEMTESA 75 MG PO TABS: 1.0000 | ORAL_TABLET | Freq: Every day | ORAL | 11 refills | Status: DC

## 2024-05-19 MED ORDER — TAMSULOSIN HCL 0.4 MG PO CAPS: 0.4000 mg | ORAL_CAPSULE | Freq: Every day | ORAL | 11 refills | Status: DC

## 2024-05-19 NOTE — Therapy (Signed)
 OUTPATIENT PHYSICAL THERAPY FEMALE PELVIC AND ORTHO TREATMENT   Patient Name: Anna Ramsey MRN: [id number] DOB:04-07-1966, 58 y.o., female Today's Date: 05/19/2024  END OF SESSION:  PT End of Session - 05/19/24 0804     Visit Number 27    Date for PT Re-Evaluation 07/17/24    Authorization Type [employer]    PT Start Time 0804    PT Stop Time 0844    PT Time Calculation (min) 40 min    Activity Tolerance Patient tolerated treatment well              Past Medical History:  Diagnosis Date   ADHD (attention deficit hyperactivity disorder) 2024   Dysuria    Erythematous bladder mucosa    Heart murmur    asymptomatic per pt --  no echo   Hematuria    History of cervical cancer    12/ 2014  Stage IB2--  s/p  TAH w/ BSO and pelvic lymphadectomy/  and radiation therapy   History of radiation therapy 10/20/13-11/28/13   pelvis 50.4 gray   Hypertension    Lesion of bladder    Mild intermittent asthma    Neuromuscular disorder (HCC)    Seasonal allergies    Thyroid disease    Past Surgical History:  Procedure Laterality Date   CYSTOSCOPY WITH BIOPSY N/A 04/29/2015   Procedure: CYSTOSCOPY WITH BIOPSY WITH FULGERATION;  Surgeon: Belvie LITTIE Clara, MD;  Location: [facility];  Service: Urology;  Laterality: N/A;  1 HR [phone] [id number]   FOOT SURGERY Left 2010   RADICAL ABDOMINAL HYSTERECTOMY  09-05-2013   w/ BILATERAL SALPINGOOPHORECTOMY AND PELVIC LYMPHADECTOMY   Patient Active Problem List   Diagnosis Date Noted   ADD (attention deficit disorder) 04/21/2024   SUI (stress urinary incontinence, female) 11/01/2023   History of urinary retention 11/01/2023   Neurogenic bladder 10/18/2023   Hematuria 10/18/2023   Acute urinary retention 10/18/2023   Stress and adjustment reaction 01/13/2021   Inattention 10/28/2019   Hypothyroid 11/13/2016   Neuropathy involving both lower extremities 11/13/2016   Cystitis, radiation 05/22/2014   Cervical  cancer (HCC) 09/01/2013   Adenocarcinoma of cervix, 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   Asthma, mild intermittent 07/07/2009   INSOMNIA 05/28/2008   Generalized anxiety disorder 05/07/2008   HYPERTENSION, BENIGN 05/31/2007    PCP: Geraline Dorothyann JONETTA, MD  REFERRING PROVIDER: Skeet Juliene SAUNDERS, DO   REFERRING DIAG:  [phone] (ICD-10-CM) - Bilateral leg weakness G62.89 (ICD-10-CM) - 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  Because I can't feel my feet she can give me cues to realign it.  I think the mat work including things where I have to lift this leg in different positions but also the other work is good too.  On a good day I can bend over and get back up and I haven't done that in 10 years.  Some days my hands bother me.    Eval: Right leg worse than left but both painful. Right has instances of knee just giving out, has neuropathy - like weakness, pain since radiation in 2015. The nerves have been affected and greatly limiting her activity. Does have symptoms of numbness, sensory deficits, weakness, ataxic gait, and painful but all vary based on the day.     Fluid intake: Yes: water    PAIN:  Are you having pain? No NPRS scale:0 Pain location: bil legs Right leg  Pain type: aching Pain description: constant   Aggravating factors: walking, end of day, rainy days Relieving factors: try not to walk, decreasing activities    PRECAUTIONS: Other: cervical cancer with  radiation  RED FLAGS: None   WEIGHT BEARING RESTRICTIONS: No  FALLS:  Has patient fallen in last 6 months? Yes. Number of falls 1x/weekly due to weakness and decreased balance from having the radiation, trouble with low light - reports she does need to furniture walk to help with balance  LIVING ENVIRONMENT: Lives with: lives with their family  OCCUPATION: sits with laptop on her lap for 12 hours per day; unable to sit with legs down due to them falling asleep  PLOF: Independent  PATIENT GOALS: to strengthening enough to walk across grass, wants to have better balance, stronger legs                                        PERTINENT HISTORY:  ADHD; Cervical Cancer 2014; 9 weeks of radiation; Radical hysterectomy;    OBJECTIVE:  Note: Objective measures were completed at Evaluation unless otherwise noted.  DIAGNOSTIC FINDINGS:   PATIENT SURVEYS:  Lower Extremity Functional Score: 5 / 80 = 6.3 %  COGNITION: Overall cognitive status: Within functional limits for tasks assessed     SENSATION: Poor sensation at Rt LE - light touch felt along posterior gastroc only no awareness without visualization of being touched at medial, lateral, anterior lower leg, reports numbness in bil feet Rt>Lt  EDEMA:  Rt ankle sometimes swells per pt  MUSCLE LENGTH: Bil hamstrings and adductors limited by 25%;  POSTURE: rounded shoulders and forward head  LOWER EXTREMITY ROM:  weakness and poor coordination/proprioception   LOWER EXTREMITY MMT:  02/04/24 hip flexion was R 3/5, L 4/5  MMT Right Eval */5 Left Eval */5 Right 5/16 Left 5/16 RIGHT 6/6 LEFT  6/6  Hip flexion 2 3 4+ 5 3   Hip extension 2 3 4+ 4 4+ 4  Hip abduction 3 3+ 5- 5 5   Hip adduction 1+ 2+ 2+ 2+ 2+ 2+  Hip internal rotation        Hip external rotation        Knee flexion 2 3 3- 4 3+ 5  Knee extension 2+ 3 3- 5 3+   Ankle dorsiflexion 0 4 0 4+ long sitting 2 4  4+ long sitting 2    Ankle plantarflexion 3 4  Ankle inversion 1 4 3+ tested in long sitting 4+ tested in long sitting 3+ in long sitting 5 in long sitting  Ankle eversion 2 4 3+ tested in long sitting 4- tested in long sitting 2+ 4- tested in long sitting   (Blank rows = not tested)   FUNCTIONAL TESTS:  Five times Sit to Stand Test (FTSS) Method: Use a straight back chair with a solid seat that is 16-18" high. Ask participant to sit on the chair with arms folded across their chest.   Instructions: "Stand up and sit down as quickly as possible 5 times, keeping your arms folded across your chest."   Measurement: Stop timing when the participant stands the 5th time.  TIME: ______ (in seconds)  Times > 13.6 seconds is associated with increased disability and morbidity (Guralnik, 2000) Times > 15 seconds is predictive of recurrent falls in healthy individuals aged 49 and older (Buatois, et al., 2008) Normal performance values in community dwelling individuals aged 62 and older (Bohannon, 2006): 50-59 years: 8 seconds 60-69 years: 11.4 seconds 70-79 years: 12.6 seconds 80-89 years: 14.8 seconds  MCID: >= 2.3 seconds for Vestibular Disorders (Meretta, 2006)  5 times sit to stand: 22s with hands - very unstable with poor hip, posture, core control  Timed up and go (TUG): 28s with SBQC - min A  GAIT: Distance walked: 200' Assistive device utilized: Quad cane small base Level of assistance: CGA Comments: Rt LE demonstrating ataxic pattern, Lt trunk lean intermittently  to right self, poor step height bil, decreased stride length, limited glute activation                                                                                                                                 TREATMENT DATE:  05/19/24: Nu step seat 4,UE 9, Lev 5 x 7 min Propped supine on wedge: bridge with therapist facilitating at knees and providing right LE support 10x Propped supine on wedge: red ball UE push down to activate core 5 sec hold  10x Propped supine on wedge: red ball roll up thighs with therapist providing some stability at the knees 10x Sidelying: min assisted hip abduction 10x right/left Sidelying: therapist providing manual resisted hip extension 10x right/left Sidelying: min assisted hip circles 5x right/left Seated hip abduction with foot on floor slider with min to mod assist needed right/left 10x Resisted cable walk 5# using handles backwards only 5x with CGA   05/14/24:Pt arrives for aquatic physical therapy. Treatment took place in 3.5-5.5 feet of water . Water  temperature was 91 degrees F. Pt entered the pool via stairs with heavy use of railings, step to step:. Pt requires buoyancy of water  for support and to offload joints with strengthening exercises.  Seated water  bench with 75% submersion Pt performed seated LE AROM exercises 20x2 in all planes, concurrent discussion of current status. Added 3.5# ankle wts for RTLE exercises. 75% depth water  walking 6 lengths in each  direction, reduced assistance level to Qtip for balance and weight added to RT ankle to increase proprioception. Pt kept ankle weight on for remainder of session. Standing SLS on the RT: LTLE swing forward and back 3x10, pt could complete without UE support. SLS RT with UE perturbations 3x10 again no UE support required. Low back stretching to complete session.    05/07/24;Pt arrives for aquatic physical therapy. Treatment took place in 3.5-5.5 feet of water . Water  temperature was 91 degrees F. Pt entered the pool via stairs with heavy use of railings, step to step:. Pt requires buoyancy of water  for support and to offload joints with strengthening exercises.  Seated water  bench with 75% submersion Pt performed seated LE AROM exercises 20x2 in all planes, concurrent discussion of current status. Added 3.5# ankle wts for RTLE exercises. 75% depth water  walking 6 lengths in each direction, reduced assistance level to Qtip for balance and weight added to  RT ankle to increase proprioception. Pt kept ankle weight on for remainder of session. Standing slow marching 20x Bil, ham curls Bil 20x  and hip abduction 20x  holding onto wall. Standing single leg stance 6x 20 sec holding Qtip: VC to really focus on pushing foot into floor and growing taller in her leg to activate her glutes better.. Seated decompression with large noodle behind patient to rest low back 2 min 2x bouts. Prone float with large noodle for bil quad stretch, RT hip hike; difficult to get correct muscle action until last 10-ish 2x10.   05/05/24: Nu step seat 4,UE 9, Lev 5 x 6 min - used yellow Tband to aid in correct alignment Seated clam with yellow band x 10 , then unilateral L only (isometric hold R) x  Seated yellow tied loop at knees arms locked with PT's - sit to stand x5 reps focusing on Rt knee control and equal WB bil LE Supine yellow tied loop at knees - pt experienced vertigo (Epley maneuver administered) Quadruped hip ext toe slides alt LE x6 total Modified plank knees and elbows max hold - multiple reps S/L on elbow with small ball adductor press into ball x10 R SL knee extension with adductor press into ball under knee R S/L hips at 45 deg: hip ABD, hip flex to ext in ABD, toe and heel taps front and back Happy baby Discussion of her current status today and the difficulties when she has bad days        PATIENT EDUCATION:  Education details: [email or web address] - uses App Person educated: Patient Education method: Explanation, Demonstration, Tactile cues, Verbal cues, and Handouts Education comprehension: verbalized understanding, returned demonstration, verbal cues required, tactile cues required, and needs further education  HOME EXERCISE PROGRAM: Access Code: [email or web address] URL: [email or web address] Date: 04/07/2024 Prepared by: Mliss  Exercises - Supine Heel Slide with Strap  - 1 x daily - 7 x weekly - 3 sets - 10 reps - Pillow squeezes with kegel  - 1  x daily - 7 x weekly - 3 sets - 10 reps - Sit to Stand with Counter Support  - 1 x daily - 7 x weekly - 1 sets - 10 reps - Seated Long Arc Quad  - 1 x daily - 7 x weekly - 1-3 sets - 10 reps - 5 sec hold - Seated Isometric Hip Abduction with Resistance  - 1 x daily - 7 x weekly - 1-3 sets - 10 reps - Heel Raises with Counter Support  - 1 x daily - 7 x  weekly - 1-3 sets - 10 reps - Standing Terminal Knee Extension at Wall with Ball  - 2 x daily - 7 x weekly - 2 sets - 10 reps - 5 sec hold - Squat with Chair Touch and Resistance Loop  - 1 x daily - 3 x weekly - 2 sets - 10 reps - Standing Knee Flexion with Counter Support  - 1 x daily - 3 x weekly - 2 sets - 10 reps - Supine Bridge with Resistance Band  - 1 x daily - 7 x weekly - 1-2 sets - 10 reps - 5 sec hold - Standing Hip Abduction on Slider  - 1 x daily - 3 x weekly - 2 sets - 10 reps - Calf stretch on rocker board  - 1 x daily - 7 x weekly - 1 sets - 3 reps - 30 sec  hold - Standing Anti-Rotation Press with Anchored Resistance  - 1 x daily - 3 x weekly - 2 sets - 10 reps - Standing Trunk Rotation with Resistance  - 1 x daily - 3 x weekly - 2 sets - 10 reps  ASSESSMENT:  CLINICAL IMPRESSION:  Karna reports functional improvements with bending and bed mobility since start of care however there is some variability depending on weather and pain responses.  Today's treatment focus is on lumbo/pelvic/hip core strengthening and stabilization.  She needs some facilitation and support of her Les in hooklying especially on the right.  Fair to good gluteal muscle activation in sidelying with minimal assist. Decreased motor control of hip adductors bilaterally in sitting.  Close CGA with resisted walking but no loss of balance with patient able to maintain good control throughout.      Eval: Patient is a 58 y.o. female  who was seen today for physical therapy evaluation and tr in the teatment for poor gait mechanics, impaired posture, impaired balance,  decreased hip and core strength, increased fall risk. Pt has complex history of  Radiation-induced lumbosacral plexopathy and Polyradiculoneuropathy status post cervical cancer. Pt has completed pelvic floor PT recently and had positive outcomes with this and resolution of symptoms. Pt very motivated to participate in PT for bil LE strengthening and decreased fall risk. Pt demonstrated impairments with objective measurements of TUG and 5xSTS indicative of increased fall risk and balance deficits. Pt reports she is unable to hold and carry grandchildren, to walk on uneven surfaces, needs assistance with dressing, walking, transfers sometimes and wants to be more I. Pt would benefit from additional PT to further address deficits.    OBJECTIVE IMPAIRMENTS: Abnormal gait, decreased activity tolerance, decreased coordination, decreased endurance, decreased mobility, difficulty walking, decreased strength, increased fascial restrictions, increased muscle spasms, impaired flexibility, impaired sensation, improper body mechanics, postural dysfunction, and pain.   ACTIVITY LIMITATIONS: carrying, lifting, bending, sitting, standing, squatting, stairs, transfers, bed mobility, locomotion level, and caring for others  PARTICIPATION LIMITATIONS: meal prep, cleaning, laundry, interpersonal relationship, driving, shopping, community activity, occupation, and yard work  PERSONAL FACTORS: Fitness, Past/current experiences, Time since onset of injury/illness/exacerbation, and 1 comorbidity: medical history are also affecting patient's functional outcome.   REHAB POTENTIAL: Good  CLINICAL DECISION MAKING: Evolving/moderate complexity  EVALUATION COMPLEXITY: Moderate   GOALS: Goals reviewed with patient? Yes  SHORT TERM GOALS: Target date: 02/13/24 Pt to be I with HEP for carry over and continuing recommendations for improved outcomes.   Baseline: Goal status: MET  2.  Pt to demonstrate at least 3/5 Rt LE  strength grossly and  4/5 Lt for improved pelvic stability and functional squats without falling.  Baseline:  Goal status: IPARTIALLY MET 03/03/24  3.  Pt to demonstrate ability to complete static standing without AD for at least 5 mins at midline to complete gentle kitchen tasks/household tasks without fear of falling.  Baseline:  Goal status: MET  4.  Pt to demonstrate I with dynamic sitting balance to better be able to reach floor level for retrieving fallen objects and putting on socks without falling Baseline:  Goal status: MET 03/03/24  LONG TERM GOALS: Target date: 07/17/24  Pt to be I with advanced HEP for carry over and continuing recommendations for improved outcomes.   Baseline:  Goal status: INITIAL  2.  Pt to report improved LEFS score to at least 30/80 for improved I with functional tasks at home such as dressing, walking from room to room.  Baseline:  Goal status: INITIAL  3.  Pt to demonstrate improved dynamic standing balance with or without AD at no more than CGA to better be able to play with grandchildren without falling.  Baseline:  Goal status: INITIAL  4.  Pt to demonstrate improved gait mechanics with LRAD for 150' with minimal compensate strategies to I with pt safety with ambulating into a store.  Baseline:  Goal status: INITIAL  5.  Pt to demonstrate ability to perform x5 Sit to stand with at least 10# to more safely assist in caring for grandchildren without risk of falling.  Baseline:  Goal status: INITIAL    PLAN:  PT FREQUENCY: 2x/week  PT DURATION: 12 weeks  PLANNED INTERVENTIONS:    97110-Therapeutic exercises, 97530- Therapeutic activity, [id number]- Neuromuscular re-education, 97535- Self Care, 02859- Manual therapy, [phone]- Gait training, [phone]- Canalith repositioning, [id number]- Aquatic Therapy, [phone]- Splinting, [id number]- Electrical stimulation (manual), [id number]- Vasopneumatic device, [id number]- Ultrasound, [id number]- Traction (mechanical), [id number]- Ionotophoresis  4mg /ml Dexamethasone , Patient/Family education, Balance training, Stair training, Taping, Dry Needling, Joint mobilization, Joint manipulation, Spinal manipulation, Spinal mobilization, Scar mobilization, DME instructions, Wheelchair mobility training, Cryotherapy, Moist heat, and Biofeedback  NEXT VISIT: for aquatic PT, core and LE strengthening and neuromuscular re-ed  for LAND: continue with resisted walking, focused standing glute med activities, hip ADDuctors and flexors   Glade Pesa, PT 05/19/24 7:53 PM Phone: [phone] Fax: [phone]

## 2024-05-19 NOTE — Patient Instructions (Addendum)
 Continue Flomax  for neurogenic bladder.   Continue to monitor for signs and symptoms of incomplete bladder emptying.   Please repeat your creatinine your kidney function.   Please schedule renal ultrasound.  Please call radiology at [phone] to schedule your imaging study today

## 2024-05-19 NOTE — Progress Notes (Signed)
 [facility] Return Visit  SUBJECTIVE  History of Present Illness: Anna Ramsey is a 58 y.o. female seen in follow-up for mixed urinary incontinence, microscopic hematuria, history of urinary retention. Plan at last visit was repeat UDS, CIC, Cr and renal US , flomax .   'Video UDS 11/01/23 at [facility] Uroflow void 70, Qmax 43mL/s, PVR scant CMG MCC , 1st sens , normal desire , strong desire + SUI, VLPP 34cmH2O Pressure flow void 203, Qmax 7, max Pdet 47, PVR . Synergic EMG. No reflux. Unable to determine compliance due to multiple zeroing during UDS.    Pt denies need for catheter adjustment during UDS. Pt reports concerns of incomplete emptying sensation and need for valsalva voiding or   Repeat UDS to determine compliance Urodynamic Impression 11/13/23:  1. Sensation was reduced; capacity was increased 2. Stress Incontinence was not demonstrated at normal pressures; 3. Detrusor Overactivity was not demonstrated. 4. Emptying was dysfunctional with a normal PVR, a sustained detrusor contraction present,  abdominal straining present, dyssynergic urethral sphincter activity on EMG.  Voids 10-15x/day per pt Restarted Flomax  with improvement of bladder emptying. Ran out for 1 week and noticed increased urgency and return of incomplete emptying sensation.  Denies need for CIC. Underwent pelvic floor PT and undergoing aquatic PT Plan for repeat Cr and Renal US .   Per patient noted stress urinary incontinence, did not see urgency UI Reports PVR during pressure flow initially until after she adducts her hips with position change. On Gemtesa  for OAB and vaginal estrogen with history of chronic inflammatory demyelinating polyradiculoneuropathy   Previously CIC volume <61mL at home intermittently.  Drinks: 60oz down from 80-120 oz Water  with Clear Mio per day  Leakage resolved with fluid management, previously UI 5-6x/day Voids  5-7x/day down from 10x/day History of stage I cervical adenocarcinoma with radical hysterectomy and radiation cystitis.  Negative cystoscopy 10/18/23  Past Medical History: Patient  has a past medical history of ADHD (attention deficit hyperactivity disorder) (2024), Dysuria, Erythematous bladder mucosa, Heart murmur, Hematuria, History of cervical cancer, History of radiation therapy (10/20/13-11/28/13), Hypertension, Lesion of bladder, Mild intermittent asthma, Neuromuscular disorder (HCC), Seasonal allergies, and Thyroid disease.   Past Surgical History: She  has a past surgical history that includes Foot surgery (Left, 2010); Radical abdominal hysterectomy (09-05-2013); and Cystoscopy with biopsy (N/A, 04/29/2015).   Medications: She has a current medication list which includes the following prescription(s): albuterol , AMBULATORY NON FORMULARY MEDICATION, amlodipine , baclofen , cetirizine, cyanocobalamin, estradiol , fluticasone , levothyroxine , metoprolol  tartrate, paroxetine , pregabalin , tamsulosin , trazodone , and gemtesa , and the following Facility-Administered Medications: lidocaine .   Allergies: Patient is allergic to sulfa antibiotics, cefdinir, and penicillins.   Social History: Patient  reports that she quit smoking about 11 years ago. Her smoking use included e-cigarettes and cigarettes. She started smoking about 36 years ago. She has a 12.5 pack-year smoking history. She has never used smokeless tobacco. She reports current alcohol use of about 1.0 standard drink of alcohol per week. She reports current drug use. Drug: Marijuana.     OBJECTIVE     Physical Exam: Vitals:   05/19/24 1529  BP: (!) 135/91  Pulse: 70   Gen: No apparent distress, A&O x 3.  Lab Results  Component Value Date   CREATININE 0.74 01/22/2023  Cr 1.08 in 09/24/23   Post Void Residual - 05/19/24 1626       Post Void Residual   Post Void Residual 1 mL  ASSESSMENT AND PLAN    [name] is a 58 y.o. with:  1. History of urinary retention   2. Neurogenic bladder      History of urinary retention Assessment & Plan: - PVR 1mL today - ED eval 04/27/23 for urinary retention with PVR > on Detrol. CT with bladder wall thickening consistent with cystitis and mild to moderate ascites of unknown etiology. Rx cipro , pyridium  and foley placement - Passed void trial on 05/10/23, recommended timed voids due to decreased sensation, reported leakage around indwelling catheter.  - ED evaluation for urinary retention 09/23/23 - S/p pelvic floor PT and CIC teaching, stopped CIC due to low volume  - started on hyoscyamine by [facility] - video UDS 11/01/23 MCC , 1st sens , normal desire , strong desire , + SUI, VLPP 34cmH2O Pressure flow void 203, Qmax 7, max Pdet 47, PVR . Synergic EMG. - repeat UDS 11/13/23 with 1st sensation , 1st uge , strong urge , MCC 914mL. Negative SUI, normal compliance, no DO. Pdet at Qmax was 17cm H2O.  Qmax 54mL/sec.  Interrupted pattern and voided 874 mL with residual of 40mL  - discontinue CIC and resume if clinical change or sensation of incomplete emptying - encouraged to continue pelvic floor relaxation exercises, Flomax  and Gemtesa    Neurogenic bladder Assessment & Plan: - history of chronic inflammatory demyelinating polyradiculoneuropathy  - continue Gemtesa  for OAB and vaginal estrogen with flomax  - urinary leakage resolved with fluid management - annual Cr, renal US  every 1-2 years and annal exam. Labwork to recheck Cr and baseline US  - labwork reprinted and radiology number provided to schedule renal US  - repeat workup if clinical change  Orders: -     Creatinine, serum; Future   Patient desires to call PRN clinical change or in 1 year.   Lianne ONEIDA Gillis, MD

## 2024-05-19 NOTE — Assessment & Plan Note (Addendum)
-   PVR 1mL today - ED eval 04/27/23 for urinary retention with PVR > on Detrol. CT with bladder wall thickening consistent with cystitis and mild to moderate ascites of unknown etiology. Rx cipro , pyridium  and foley placement - Passed void trial on 05/10/23, recommended timed voids due to decreased sensation, reported leakage around indwelling catheter.  - ED evaluation for urinary retention 09/23/23 - S/p pelvic floor PT and CIC teaching, stopped CIC due to low volume  - started on hyoscyamine by [facility] - video UDS 11/01/23 [facility] , 1st sens , normal desire , strong desire , + SUI, VLPP 34cmH2O Pressure flow void 203, Qmax 7, max Pdet 47, PVR . Synergic EMG. - repeat UDS 11/13/23 with 1st sensation , 1st uge , strong urge , MCC 914mL. Negative SUI, normal compliance, no DO. Pdet at Qmax was 17cm H2O.  Qmax 3mL/sec.  Interrupted pattern and voided 874 mL with residual of 40mL  - discontinue CIC and resume if clinical change or sensation of incomplete emptying - encouraged to continue pelvic floor relaxation exercises, Flomax  and Gemtesa

## 2024-05-19 NOTE — Assessment & Plan Note (Addendum)
-   history of chronic inflammatory demyelinating polyradiculoneuropathy  - continue Gemtesa  for OAB and vaginal estrogen with flomax  - urinary leakage resolved with fluid management - annual Cr, renal US  every 1-2 years and annal exam. Labwork to recheck Cr and baseline US  - labwork reprinted and radiology number provided to schedule renal US  - repeat workup if clinical change

## 2024-05-20 ENCOUNTER — Ambulatory Visit: Admitting: Family Medicine

## 2024-05-20 ENCOUNTER — Encounter: Payer: Self-pay | Admitting: Physical Therapy

## 2024-05-21 ENCOUNTER — Ambulatory Visit: Admitting: Physical Therapy

## 2024-05-27 ENCOUNTER — Ambulatory Visit: Attending: Neurology | Admitting: Physical Therapy

## 2024-05-27 ENCOUNTER — Encounter: Payer: Self-pay | Admitting: Sports Medicine

## 2024-05-27 DIAGNOSIS — R269 Unspecified abnormalities of gait and mobility: Secondary | ICD-10-CM | POA: Diagnosis present

## 2024-05-27 DIAGNOSIS — R293 Abnormal posture: Secondary | ICD-10-CM | POA: Insufficient documentation

## 2024-05-27 DIAGNOSIS — R102 Pelvic and perineal pain: Secondary | ICD-10-CM | POA: Insufficient documentation

## 2024-05-27 DIAGNOSIS — R279 Unspecified lack of coordination: Secondary | ICD-10-CM | POA: Diagnosis present

## 2024-05-27 DIAGNOSIS — M62838 Other muscle spasm: Secondary | ICD-10-CM | POA: Diagnosis present

## 2024-05-27 DIAGNOSIS — M6281 Muscle weakness (generalized): Secondary | ICD-10-CM | POA: Insufficient documentation

## 2024-05-27 NOTE — Therapy (Signed)
 OUTPATIENT PHYSICAL THERAPY FEMALE PELVIC AND ORTHO TREATMENT   Patient Name: Anna Ramsey MRN: [id number] DOB:[birth year], 58 y.o., female Today's Date: 05/27/2024  END OF SESSION:  PT End of Session - 05/27/24 0755     Visit Number 28    Date for PT Re-Evaluation 07/17/24    Authorization Type [employer]    PT Start Time 0757    PT Stop Time 0838    PT Time Calculation (min) 41 min    Activity Tolerance Patient tolerated treatment well              Past Medical History:  Diagnosis Date   ADHD (attention deficit hyperactivity disorder) 2024   Dysuria    Erythematous bladder mucosa    Heart murmur    asymptomatic per pt --  no echo   Hematuria    History of cervical cancer    12/ 2014  Stage IB2--  s/p  TAH w/ BSO and pelvic lymphadectomy/  and radiation therapy   History of radiation therapy 10/20/13-11/28/13   pelvis 50.4 gray   Hypertension    Lesion of bladder    Mild intermittent asthma    Neuromuscular disorder (HCC)    Seasonal allergies    Thyroid disease    Past Surgical History:  Procedure Laterality Date   CYSTOSCOPY WITH BIOPSY N/A 04/29/2015   Procedure: CYSTOSCOPY WITH BIOPSY WITH FULGERATION;  Surgeon: Belvie LITTIE Clara, MD;  Location: [facility];  Service: Urology;  Laterality: N/A;  1 HR [phone] [id number]   FOOT SURGERY Left 2010   RADICAL ABDOMINAL HYSTERECTOMY  09-05-2013   w/ BILATERAL SALPINGOOPHORECTOMY AND PELVIC LYMPHADECTOMY   Patient Active Problem List   Diagnosis Date Noted   ADD (attention deficit disorder) 04/21/2024   SUI (stress urinary incontinence, female) 11/01/2023   History of urinary retention 11/01/2023   Neurogenic bladder 10/18/2023   Hematuria 10/18/2023   Acute urinary retention 10/18/2023   Stress and adjustment reaction 01/13/2021   Inattention 10/28/2019   Hypothyroid 11/13/2016   Neuropathy involving both lower extremities 11/13/2016   Cystitis, radiation 05/22/2014   Cervical  cancer (HCC) 09/01/2013   Adenocarcinoma of cervix, 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   Asthma, mild intermittent 07/07/2009   INSOMNIA 05/28/2008   Generalized anxiety disorder 05/07/2008   HYPERTENSION, BENIGN 05/31/2007    PCP: Geraline Dorothyann JONETTA, MD  REFERRING PROVIDER: Skeet Juliene SAUNDERS, DO   REFERRING DIAG:  [phone] (ICD-10-CM) - Bilateral leg weakness G62.89 (ICD-10-CM) - Other polyneuropathy G61.0 (ICD-10-CM) - Polyradiculoneuropathy (HCC) 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  She reports it's not hurting today.  Wearing a regular shoe.  Going to the beach next week.      Eval: Right leg worse than left but both painful. Right has instances of knee just giving out, has neuropathy - like weakness, pain since radiation in 2015. The nerves have been affected and greatly limiting her activity. Does have symptoms of numbness, sensory deficits, weakness, ataxic gait, and painful but all vary based on the day.     Fluid intake: Yes: water    PAIN:  Are you having pain? No NPRS scale:0 Pain location: bil legs Right leg  Pain type: aching Pain description: constant   Aggravating factors: walking, end of day, rainy days Relieving factors: try not to walk, decreasing activities    PRECAUTIONS: Other: cervical cancer with radiation  RED FLAGS: None   WEIGHT BEARING RESTRICTIONS: No  FALLS:  Has patient fallen in last 6 months? Yes. Number of falls 1x/weekly due to weakness and decreased balance from  having the radiation, trouble with low light - reports she does need to furniture walk to help with balance  LIVING ENVIRONMENT: Lives with: lives with their family  OCCUPATION: sits with laptop on her lap for 12 hours per day; unable to sit with legs down due to them falling asleep  PLOF: Independent  PATIENT GOALS: to strengthening enough to walk across grass, wants to have better balance, stronger legs                                        PERTINENT HISTORY:  ADHD; Cervical Cancer 2014; 9 weeks of radiation; Radical hysterectomy;    OBJECTIVE:  Note: Objective measures were completed at Evaluation unless otherwise noted.  DIAGNOSTIC FINDINGS:   PATIENT SURVEYS:  Lower Extremity Functional Score: 5 / 80 = 6.3 %  COGNITION: Overall cognitive status: Within functional limits for tasks assessed     SENSATION: Poor sensation at Rt LE - light touch felt along posterior gastroc only no awareness without visualization of being touched at medial, lateral, anterior lower leg, reports numbness in bil feet Rt>Lt  EDEMA:  Rt ankle sometimes swells per pt  MUSCLE LENGTH: Bil hamstrings and adductors limited by 25%;  POSTURE: rounded shoulders and forward head  LOWER EXTREMITY ROM:  weakness and poor coordination/proprioception   LOWER EXTREMITY MMT:  02/04/24 hip flexion was R 3/5, L 4/5  MMT Right Eval */5 Left Eval */5 Right 5/16 Left 5/16 RIGHT 6/6 LEFT  6/6  Hip flexion 2 3 4+ 5 3   Hip extension 2 3 4+ 4 4+ 4  Hip abduction 3 3+ 5- 5 5   Hip adduction 1+ 2+ 2+ 2+ 2+ 2+  Hip internal rotation        Hip external rotation        Knee flexion 2 3 3- 4 3+ 5  Knee extension 2+ 3 3- 5 3+   Ankle dorsiflexion 0 4 0 4+ long sitting 2 4  4+ long sitting 2    Ankle plantarflexion 3 4      Ankle inversion 1 4 3+ tested in long sitting 4+ tested in long sitting 3+ in long sitting 5 in long sitting  Ankle eversion 2 4 3+ tested in long sitting 4- tested in long sitting  2+ 4-  tested in long sitting   (Blank rows = not tested)   FUNCTIONAL TESTS:  Five times Sit to Stand Test (FTSS) Method: Use a straight back chair with a solid seat that is 16-18" high. Ask participant to sit on the chair with arms folded across their chest.   Instructions: "Stand up and sit down as quickly as possible 5 times, keeping your arms folded across your chest."   Measurement: Stop timing when the participant stands the 5th time.  TIME: ______ (in seconds)  Times > 13.6 seconds is associated with increased disability and morbidity (Guralnik, 2000) Times > 15 seconds is predictive of recurrent falls in healthy individuals aged 73 and older (Buatois, et al., 2008) Normal performance values in community dwelling individuals aged 46 and older (Bohannon, 2006): 50-59 years: 8 seconds 60-69 years: 11.4 seconds 70-79 years: 12.6 seconds 80-89 years: 14.8 seconds  MCID: >= 2.3 seconds for Vestibular Disorders (Meretta, 2006)  5 times sit to stand: 22s with hands - very unstable with poor hip, posture, core control  Timed up and go (TUG): 28s with SBQC - min A  GAIT: Distance walked: 200' Assistive device utilized: Quad cane small base Level of assistance: CGA Comments: Rt LE demonstrating ataxic pattern, Lt trunk lean intermittently  to right self, poor step height bil, decreased stride length, limited glute activation                                                                                                                                 TREATMENT DATE:  05/27/24: Nu step seat 4,UE 9, Lev 5 x 6 min while discussing status including new right 5th phalanx fracture Propped supine on wedge:  ball squeeze between knees 7x Propped supine on wedge: purple ball between knees with bridge 5 sec hold 10x Propped supine on wedge: red ball UE push down to activate core 5 sec hold 10x Propped supine on wedge: red ball roll up thighs  10x Sidelying: side planks (elbow and knees)5x  5 sec hold right/left Sidelying: hip abduction 10x right/left Sidelying: therapist providing manual resisted (progressively more resistance) hip extension 10x right/left Sidelying: min assisted hip circles clockwise and counterclockwise 5x right/left Supine black band crossed over shoe and behind calf: pt holding ends and pushing down for hip extension 10x right/left (therapist holding right foot in neutral)  Happy baby stretch Discussed wearing her AFO to provide 5th toe support and stabilization  05/19/24: Nu step seat 4,UE 9, Lev 5 x 7 min Propped supine on wedge: bridge with therapist facilitating at knees and providing right LE support 10x Propped supine on wedge: red ball UE push down to activate core 5 sec hold 10x Propped supine on wedge: red ball roll up thighs with therapist providing some stability at the knees 10x Sidelying: min assisted hip abduction 10x right/left Sidelying: therapist providing manual resisted hip extension 10x right/left Sidelying: min assisted hip circles 5x right/left Seated hip abduction  with foot on floor slider with min to mod assist needed right/left 10x Resisted cable walk 5# using handles backwards only 5x with CGA   05/14/24:Pt arrives for aquatic physical therapy. Treatment took place in 3.5-5.5 feet of water . Water  temperature was 91 degrees F. Pt entered the pool via stairs with heavy use of railings, step to step:. Pt requires buoyancy of water  for support and to offload joints with strengthening exercises.  Seated water  bench with 75% submersion Pt performed seated LE AROM exercises 20x2 in all planes, concurrent discussion of current status. Added 3.5# ankle wts for RTLE exercises. 75% depth water  walking 6 lengths in each direction, reduced assistance level to Qtip for balance and weight added to RT ankle to increase proprioception. Pt kept ankle weight on for remainder of session. Standing SLS on the RT: LTLE swing forward and back 3x10, pt could  complete without UE support. SLS RT with UE perturbations 3x10 again no UE support required. Low back stretching to complete session.    05/07/24;Pt arrives for aquatic physical therapy. Treatment took place in 3.5-5.5 feet of water . Water  temperature was 91 degrees F. Pt entered the pool via stairs with heavy use of railings, step to step:. Pt requires buoyancy of water  for support and to offload joints with strengthening exercises.  Seated water  bench with 75% submersion Pt performed seated LE AROM exercises 20x2 in all planes, concurrent discussion of current status. Added 3.5# ankle wts for RTLE exercises. 75% depth water  walking 6 lengths in each direction, reduced assistance level to Qtip for balance and weight added to RT ankle to increase proprioception. Pt kept ankle weight on for remainder of session. Standing slow marching 20x Bil, ham curls Bil 20x  and hip abduction 20x  holding onto wall. Standing single leg stance 6x 20 sec holding Qtip: VC to really focus on pushing foot into floor and growing taller in her leg to activate her glutes better.. Seated decompression with large noodle behind patient to rest low back 2 min 2x bouts. Prone float with large noodle for bil quad stretch, RT hip hike; difficult to get correct muscle action until last 10-ish 2x10.   05/05/24: Nu step seat 4,UE 9, Lev 5 x 6 min - used yellow Tband to aid in correct alignment Seated clam with yellow band x 10 , then unilateral L only (isometric hold R) x  Seated yellow tied loop at knees arms locked with PT's - sit to stand x5 reps focusing on Rt knee control and equal WB bil LE Supine yellow tied loop at knees - pt experienced vertigo (Epley maneuver administered) Quadruped hip ext toe slides alt LE x6 total Modified plank knees and elbows max hold - multiple reps S/L on elbow with small ball adductor press into ball x10 R SL knee extension with adductor press into ball under knee R S/L hips at 45 deg: hip ABD, hip  flex to ext in ABD, toe and heel taps front and back Happy baby Discussion of her current status today and the difficulties when she has bad days        PATIENT EDUCATION:  Education details: [email or web address] - uses App Person educated: Patient Education method: Explanation, Demonstration, Tactile cues, Verbal cues, and Handouts Education comprehension: verbalized understanding, returned demonstration, verbal cues required, tactile cues required, and needs further education  HOME EXERCISE PROGRAM: Access Code: [email or web address] URL: [email or web address] Date: 04/07/2024 Prepared by: Mliss  Exercises - Supine Heel Slide with Strap  - 1 x daily -  7 x weekly - 3 sets - 10 reps - Pillow squeezes with kegel  - 1 x daily - 7 x weekly - 3 sets - 10 reps - Sit to Stand with Counter Support  - 1 x daily - 7 x weekly - 1 sets - 10 reps - Seated Long Arc Quad  - 1 x daily - 7 x weekly - 1-3 sets - 10 reps - 5 sec hold - Seated Isometric Hip Abduction with Resistance  - 1 x daily - 7 x weekly - 1-3 sets - 10 reps - Heel Raises with Counter Support  - 1 x daily - 7 x weekly - 1-3 sets - 10 reps - Standing Terminal Knee Extension at Wall with Ball  - 2 x daily - 7 x weekly - 2 sets - 10 reps - 5 sec hold - Squat with Chair Touch and Resistance Loop  - 1 x daily - 3 x weekly - 2 sets - 10 reps - Standing Knee Flexion with Counter Support  - 1 x daily - 3 x weekly - 2 sets - 10 reps - Supine Bridge with Resistance Band  - 1 x daily - 7 x weekly - 1-2 sets - 10 reps - 5 sec hold - Standing Hip Abduction on Slider  - 1 x daily - 3 x weekly - 2 sets - 10 reps - Calf stretch on rocker board  - 1 x daily - 7 x weekly - 1 sets - 3 reps - 30 sec  hold - Standing Anti-Rotation Press with Anchored Resistance  - 1 x daily - 3 x weekly - 2 sets - 10 reps - Standing Trunk Rotation with Resistance  - 1 x daily - 3 x weekly - 2 sets - 10 reps  ASSESSMENT:  CLINICAL IMPRESSION:  Patient has new onset right  5th toe fracture secondary to a roll-over injury (decreased LE motor control related to lumbosacral plexopathy and polyradiculoneuropathy.)  Treatment modified by performance of limited weight bearing ex's to avoid loading the 5th toe.   Verbal cues to activate the core during exercises to support better posture and movement efficiency which are key elements of coordination and strength.  Some tactile support needed for LE alignment in hooklying but improved sidelying active motor control with hip abduction and extension needed for ambulation.    Eval: Patient is a 58 y.o. female  who was seen today for physical therapy evaluation and tr in the teatment for poor gait mechanics, impaired posture, impaired balance, decreased hip and core strength, increased fall risk. Pt has complex history of  Radiation-induced lumbosacral plexopathy and Polyradiculoneuropathy status post cervical cancer. Pt has completed pelvic floor PT recently and had positive outcomes with this and resolution of symptoms. Pt very motivated to participate in PT for bil LE strengthening and decreased fall risk. Pt demonstrated impairments with objective measurements of TUG and 5xSTS indicative of increased fall risk and balance deficits. Pt reports she is unable to hold and carry grandchildren, to walk on uneven surfaces, needs assistance with dressing, walking, transfers sometimes and wants to be more I. Pt would benefit from additional PT to further address deficits.    OBJECTIVE IMPAIRMENTS: Abnormal gait, decreased activity tolerance, decreased coordination, decreased endurance, decreased mobility, difficulty walking, decreased strength, increased fascial restrictions, increased muscle spasms, impaired flexibility, impaired sensation, improper body mechanics, postural dysfunction, and pain.   ACTIVITY LIMITATIONS: carrying, lifting, bending, sitting, standing, squatting, stairs, transfers, bed mobility, locomotion level, and caring for  others  PARTICIPATION LIMITATIONS: meal prep, cleaning, laundry, interpersonal relationship, driving, shopping, community activity, occupation, and yard work  PERSONAL FACTORS: Fitness, Past/current experiences, Time since onset of injury/illness/exacerbation, and 1 comorbidity: medical history are also affecting patient's functional outcome.   REHAB POTENTIAL: Good  CLINICAL DECISION MAKING: Evolving/moderate complexity  EVALUATION COMPLEXITY: Moderate   GOALS: Goals reviewed with patient? Yes  SHORT TERM GOALS: Target date: 02/13/24 Pt to be I with HEP for carry over and continuing recommendations for improved outcomes.   Baseline: Goal status: MET  2.  Pt to demonstrate at least 3/5 Rt LE strength grossly and 4/5 Lt for improved pelvic stability and functional squats without falling.  Baseline:  Goal status: IPARTIALLY MET 03/03/24  3.  Pt to demonstrate ability to complete static standing without AD for at least 5 mins at midline to complete gentle kitchen tasks/household tasks without fear of falling.  Baseline:  Goal status: MET  4.  Pt to demonstrate I with dynamic sitting balance to better be able to reach floor level for retrieving fallen objects and putting on socks without falling Baseline:  Goal status: MET 03/03/24  LONG TERM GOALS: Target date: 07/17/24  Pt to be I with advanced HEP for carry over and continuing recommendations for improved outcomes.   Baseline:  Goal status: INITIAL  2.  Pt to report improved LEFS score to at least 30/80 for improved I with functional tasks at home such as dressing, walking from room to room.  Baseline:  Goal status: INITIAL  3.  Pt to demonstrate improved dynamic standing balance with or without AD at no more than CGA to better be able to play with grandchildren without falling.  Baseline:  Goal status: INITIAL  4.  Pt to demonstrate improved gait mechanics with LRAD for 150' with minimal compensate strategies to I with pt  safety with ambulating into a store.  Baseline:  Goal status: INITIAL  5.  Pt to demonstrate ability to perform x5 Sit to stand with at least 10# to more safely assist in caring for grandchildren without risk of falling.  Baseline:  Goal status: INITIAL    PLAN:  PT FREQUENCY: 2x/week  PT DURATION: 12 weeks  PLANNED INTERVENTIONS:    97110-Therapeutic exercises, 97530- Therapeutic activity, [id number]- Neuromuscular re-education, 97535- Self Care, [id number]- Manual therapy, [phone]- Gait training, [phone]- Canalith repositioning, [id number]- Aquatic Therapy, [phone]- Splinting, [id number]- Electrical stimulation (manual), [id number]- Vasopneumatic device, [id number]- Ultrasound, [id number]- Traction (mechanical), [id number]- Ionotophoresis 4mg /ml Dexamethasone , Patient/Family education, Balance training, Stair training, Taping, Dry Needling, Joint mobilization, Joint manipulation, Spinal manipulation, Spinal mobilization, Scar mobilization, DME instructions, Wheelchair mobility training, Cryotherapy, Moist heat, and Biofeedback  NEXT VISIT: for aquatic PT, core and LE strengthening and neuromuscular re-ed  for LAND: continue with resisted walking, focused standing glute med activities, hip ADDuctors and flexors   Glade Pesa, PT 05/27/24 11:45 AM Phone: [phone] Fax: [phone]

## 2024-06-04 ENCOUNTER — Ambulatory Visit: Admitting: Physical Therapy

## 2024-06-09 ENCOUNTER — Encounter: Payer: Self-pay | Admitting: Physical Therapy

## 2024-06-09 ENCOUNTER — Ambulatory Visit: Admitting: Physical Therapy

## 2024-06-09 DIAGNOSIS — R269 Unspecified abnormalities of gait and mobility: Secondary | ICD-10-CM

## 2024-06-09 DIAGNOSIS — R293 Abnormal posture: Secondary | ICD-10-CM

## 2024-06-09 DIAGNOSIS — M6281 Muscle weakness (generalized): Secondary | ICD-10-CM | POA: Diagnosis not present

## 2024-06-09 DIAGNOSIS — M62838 Other muscle spasm: Secondary | ICD-10-CM

## 2024-06-09 DIAGNOSIS — R279 Unspecified lack of coordination: Secondary | ICD-10-CM

## 2024-06-09 NOTE — Therapy (Signed)
 OUTPATIENT PHYSICAL THERAPY FEMALE PELVIC AND ORTHO TREATMENT   Patient Name: Anna Ramsey MRN: [id number] DOB:[birth year], 58 y.o., female Today's Date: 06/09/2024  END OF SESSION:  PT End of Session - 06/09/24 0759     Visit Number 29    Date for PT Re-Evaluation 07/17/24    Authorization Type [employer]    PT Start Time 0800    PT Stop Time 0845    PT Time Calculation (min) 45 min    Activity Tolerance Patient tolerated treatment well    Behavior During Therapy [facility] for tasks assessed/performed               Past Medical History:  Diagnosis Date   ADHD (attention deficit hyperactivity disorder) 2024   Dysuria    Erythematous bladder mucosa    Heart murmur    asymptomatic per pt --  no echo   Hematuria    History of cervical cancer    12/ 2014  Stage IB2--  s/p  TAH w/ BSO and pelvic lymphadectomy/  and radiation therapy   History of radiation therapy 10/20/13-11/28/13   pelvis 50.4 gray   Hypertension    Lesion of bladder    Mild intermittent asthma    Neuromuscular disorder (HCC)    Seasonal allergies    Thyroid disease    Past Surgical History:  Procedure Laterality Date   CYSTOSCOPY WITH BIOPSY N/A 04/29/2015   Procedure: CYSTOSCOPY WITH BIOPSY WITH FULGERATION;  Surgeon: Belvie LITTIE Clara, MD;  Location: [facility];  Service: Urology;  Laterality: N/A;  1 HR [phone] [id number]   FOOT SURGERY Left 2010   RADICAL ABDOMINAL HYSTERECTOMY  09-05-2013   w/ BILATERAL SALPINGOOPHORECTOMY AND PELVIC LYMPHADECTOMY   Patient Active Problem List   Diagnosis Date Noted   ADD (attention deficit disorder) 04/21/2024   SUI (stress urinary incontinence, female) 11/01/2023   History of urinary retention 11/01/2023   Neurogenic bladder 10/18/2023   Hematuria 10/18/2023   Acute urinary retention 10/18/2023   Stress and adjustment reaction 01/13/2021   Inattention 10/28/2019   Hypothyroid 11/13/2016   Neuropathy involving both lower  extremities 11/13/2016   Cystitis, radiation 05/22/2014   Cervical cancer (HCC) 09/01/2013   Adenocarcinoma of cervix, 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   Asthma, mild intermittent 07/07/2009   INSOMNIA 05/28/2008   Generalized anxiety disorder 05/07/2008   HYPERTENSION, BENIGN 05/31/2007    PCP: Geraline Dorothyann JONETTA, MD  REFERRING PROVIDER: Skeet Juliene SAUNDERS, DO   REFERRING DIAG:  [phone] (ICD-10-CM) - Bilateral leg weakness G62.89 (ICD-10-CM) - 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  I was on my feet and walked to the beach every morning which was more walking than I usually do.  I was able to do it with the help of an arm or my cane.  My Rt toe is much better (broke pinky toe several weeks ago).    Eval: Right leg worse than left but both painful. Right has instances of knee just giving out, has neuropathy - like weakness, pain since radiation in 2015. The nerves have been affected and greatly limiting her activity. Does have symptoms of numbness, sensory deficits, weakness, ataxic gait, and painful but all vary based on the day.     Fluid intake: Yes: water    PAIN:  Are you having pain? No NPRS scale:0 Pain location: bil legs Right leg  Pain type: aching Pain description: constant   Aggravating factors: walking, end of day, rainy days Relieving factors: try not to walk, decreasing activities     PRECAUTIONS: Other: cervical cancer with radiation  RED FLAGS: None   WEIGHT BEARING RESTRICTIONS: No  FALLS:  Has patient fallen in last 6 months? Yes. Number of falls 1x/weekly due to weakness and decreased balance from having the radiation, trouble with low light - reports she does need to furniture walk to help with balance  LIVING ENVIRONMENT: Lives with: lives with their family  OCCUPATION: sits with laptop on her lap for 12 hours per day; unable to sit with legs down due to them falling asleep  PLOF: Independent  PATIENT GOALS: to strengthening enough to walk across grass, wants to have better balance, stronger legs                                        PERTINENT HISTORY:  ADHD; Cervical Cancer 2014; 9 weeks of radiation; Radical hysterectomy;    OBJECTIVE:  Note: Objective measures were completed at Evaluation unless otherwise noted.  DIAGNOSTIC FINDINGS:   PATIENT SURVEYS:  Lower Extremity Functional Score: 5 / 80 = 6.3 %  COGNITION: Overall cognitive status: Within functional limits for tasks assessed     SENSATION: Poor sensation at Rt LE - light touch felt along posterior gastroc only no awareness without visualization of being touched at medial, lateral, anterior lower leg, reports numbness in bil feet Rt>Lt  EDEMA:  Rt ankle sometimes swells per pt  MUSCLE LENGTH: Bil hamstrings and adductors limited by 25%;  POSTURE: rounded shoulders and forward head  LOWER EXTREMITY ROM:  weakness and poor coordination/proprioception   LOWER EXTREMITY MMT:  02/04/24 hip flexion was R 3/5, L 4/5  MMT Right Eval */5 Left Eval */5 Right 5/16 Left 5/16 RIGHT 6/6 LEFT  6/6  Hip flexion 2 3 4+ 5 3   Hip extension 2 3 4+ 4 4+ 4  Hip abduction 3 3+ 5- 5 5   Hip adduction 1+ 2+ 2+ 2+ 2+ 2+  Hip internal rotation        Hip external rotation        Knee flexion 2 3 3- 4 3+ 5  Knee extension 2+ 3 3- 5 3+   Ankle dorsiflexion 0 4 0 4+ long sitting 2 4  4+  long sitting 2    Ankle plantarflexion 3 4      Ankle inversion 1 4 3+ tested in long sitting 4+ tested in long sitting 3+ in  long sitting 5 in long sitting  Ankle eversion 2 4 3+ tested in long sitting 4- tested in long sitting 2+ 4- tested in long sitting   (Blank rows = not tested)   FUNCTIONAL TESTS:  Five times Sit to Stand Test (FTSS) Method: Use a straight back chair with a solid seat that is 16-18" high. Ask participant to sit on the chair with arms folded across their chest.   Instructions: "Stand up and sit down as quickly as possible 5 times, keeping your arms folded across your chest."   Measurement: Stop timing when the participant stands the 5th time.  TIME: ______ (in seconds)  Times > 13.6 seconds is associated with increased disability and morbidity (Guralnik, 2000) Times > 15 seconds is predictive of recurrent falls in healthy individuals aged 65 and older (Buatois, et al., 2008) Normal performance values in community dwelling individuals aged 35 and older (Bohannon, 2006): 50-59 years: 8 seconds 60-69 years: 11.4 seconds 70-79 years: 12.6 seconds 80-89 years: 14.8 seconds  MCID: >= 2.3 seconds for Vestibular Disorders (Meretta, 2006)  5 times sit to stand: 22s with hands - very unstable with poor hip, posture, core control  Timed up and go (TUG): 28s with SBQC - min A  GAIT: Distance walked: 200' Assistive device utilized: Quad cane small base Level of assistance: CGA Comments: Rt LE demonstrating ataxic pattern, Lt trunk lean intermittently  to right self, poor step height bil, decreased stride length, limited glute activation                                                                                                                                 TREATMENT DATE:  06/09/24: NuStep seat 4 arms 9 L5 x 7' (PT present to discuss how trip went with getting down to beach and back) Semi-reclined to wedge: ball squeeze 10x5, bridge with ball squeeze 10x5,  Rt LE quad and glut set imprint press into mat table 10x5, red ball push down for core activation 10x5, red ball roll ups for core x10 Sidelying: side planks (elbow and knees)5x 5 sec hold right/left Sidelying: hip abduction 10x right/left Seated sit ups with OH press 5lb x10, then slow seated 5lb russian twist x12, then fast russian twist x12 - Pto provided knee stabilization for core focus Sit to stand min A x3  05/27/24: Nu step seat 4,UE 9, Lev 5 x 6 min while discussing status including new right 5th phalanx fracture Propped supine on wedge:  ball squeeze between knees 7x Propped supine on wedge: purple ball between knees with bridge 5 sec hold 10x Propped supine on wedge: red ball UE push down to activate core 5 sec hold 10x Propped supine on wedge: red ball roll up thighs  10x Sidelying: side planks (elbow and knees)5x 5 sec hold right/left Sidelying: hip abduction 10x right/left Sidelying: therapist providing manual resisted (progressively more resistance) hip extension 10x right/left Sidelying: min assisted hip circles  clockwise and counterclockwise 5x right/left Supine black band crossed over shoe and behind calf: pt holding ends and pushing down for hip extension 10x right/left (therapist holding right foot in neutral)  Happy baby stretch Discussed wearing her AFO to provide 5th toe support and stabilization  05/19/24: Nu step seat 4,UE 9, Lev 5 x 7 min Propped supine on wedge: bridge with therapist facilitating at knees and providing right LE support 10x Propped supine on wedge: red ball UE push down to activate core 5 sec hold 10x Propped supine on wedge: red ball roll up thighs with therapist providing some stability at the knees 10x Sidelying: min assisted hip abduction 10x right/left Sidelying: therapist providing manual resisted hip extension 10x right/left Sidelying: min assisted hip circles 5x right/left Seated hip abduction with foot on floor slider with min to mod  assist needed right/left 10x Resisted cable walk 5# using handles backwards only 5x with CGA   05/14/24:Pt arrives for aquatic physical therapy. Treatment took place in 3.5-5.5 feet of water . Water  temperature was 91 degrees F. Pt entered the pool via stairs with heavy use of railings, step to step:. Pt requires buoyancy of water  for support and to offload joints with strengthening exercises.  Seated water  bench with 75% submersion Pt performed seated LE AROM exercises 20x2 in all planes, concurrent discussion of current status. Added 3.5# ankle wts for RTLE exercises. 75% depth water  walking 6 lengths in each direction, reduced assistance level to Qtip for balance and weight added to RT ankle to increase proprioception. Pt kept ankle weight on for remainder of session. Standing SLS on the RT: LTLE swing forward and back 3x10, pt could complete without UE support. SLS RT with UE perturbations 3x10 again no UE support required. Low back stretching to complete session.    05/07/24;Pt arrives for aquatic physical therapy. Treatment took place in 3.5-5.5 feet of water . Water  temperature was 91 degrees F. Pt entered the pool via stairs with heavy use of railings, step to step:. Pt requires buoyancy of water  for support and to offload joints with strengthening exercises.  Seated water  bench with 75% submersion Pt performed seated LE AROM exercises 20x2 in all planes, concurrent discussion of current status. Added 3.5# ankle wts for RTLE exercises. 75% depth water  walking 6 lengths in each direction, reduced assistance level to Qtip for balance and weight added to RT ankle to increase proprioception. Pt kept ankle weight on for remainder of session. Standing slow marching 20x Bil, ham curls Bil 20x  and hip abduction 20x  holding onto wall. Standing single leg stance 6x 20 sec holding Qtip: VC to really focus on pushing foot into floor and growing taller in her leg to activate her glutes better.. Seated  decompression with large noodle behind patient to rest low back 2 min 2x bouts. Prone float with large noodle for bil quad stretch, RT hip hike; difficult to get correct muscle action until last 10-ish 2x10.    PATIENT EDUCATION:  Education details: [email or web address] - uses App Person educated: Patient Education method: Explanation, Demonstration, Tactile cues, Verbal cues, and Handouts Education comprehension: verbalized understanding, returned demonstration, verbal cues required, tactile cues required, and needs further education  HOME EXERCISE PROGRAM: Access Code: [email or web address] URL: [email or web address] Date: 04/07/2024 Prepared by: Mliss  Exercises - Supine Heel Slide with Strap  - 1 x daily - 7 x weekly - 3 sets - 10 reps - Pillow squeezes with kegel  - 1 x daily - 7 x weekly - 3 sets -  10 reps - Sit to Stand with Counter Support  - 1 x daily - 7 x weekly - 1 sets - 10 reps - Seated Long Arc Quad  - 1 x daily - 7 x weekly - 1-3 sets - 10 reps - 5 sec hold - Seated Isometric Hip Abduction with Resistance  - 1 x daily - 7 x weekly - 1-3 sets - 10 reps - Heel Raises with Counter Support  - 1 x daily - 7 x weekly - 1-3 sets - 10 reps - Standing Terminal Knee Extension at Wall with Ball  - 2 x daily - 7 x weekly - 2 sets - 10 reps - 5 sec hold - Squat with Chair Touch and Resistance Loop  - 1 x daily - 3 x weekly - 2 sets - 10 reps - Standing Knee Flexion with Counter Support  - 1 x daily - 3 x weekly - 2 sets - 10 reps - Supine Bridge with Resistance Band  - 1 x daily - 7 x weekly - 1-2 sets - 10 reps - 5 sec hold - Standing Hip Abduction on Slider  - 1 x daily - 3 x weekly - 2 sets - 10 reps - Calf stretch on rocker board  - 1 x daily - 7 x weekly - 1 sets - 3 reps - 30 sec  hold - Standing Anti-Rotation Press with Anchored Resistance  - 1 x daily - 3 x weekly - 2 sets - 10 reps - Standing Trunk Rotation with Resistance  - 1 x daily - 3 x weekly - 2 sets - 10  reps  ASSESSMENT:  CLINICAL IMPRESSION:  Patient reports much less pain and good healing of Rt 5th toe fracture.  She got a lot of walking and exercise in at the beach last week and felt really good about being able to walk as much as she did, with the support of an arm or her cane.  Pt demos improving activation of gluteals and quad but needs either external with strap or manual assistance aligning knees in hooklying and sitting due to proximal stabilization weakness.  Sit to stand is still heavily reliant on Lt LE with Rt knee locking to stabilize vs using quad in closed chain.    Eval: Patient is a 58 y.o. female  who was seen today for physical therapy evaluation and tr in the teatment for poor gait mechanics, impaired posture, impaired balance, decreased hip and core strength, increased fall risk. Pt has complex history of  Radiation-induced lumbosacral plexopathy and Polyradiculoneuropathy status post cervical cancer. Pt has completed pelvic floor PT recently and had positive outcomes with this and resolution of symptoms. Pt very motivated to participate in PT for bil LE strengthening and decreased fall risk. Pt demonstrated impairments with objective measurements of TUG and 5xSTS indicative of increased fall risk and balance deficits. Pt reports she is unable to hold and carry grandchildren, to walk on uneven surfaces, needs assistance with dressing, walking, transfers sometimes and wants to be more I. Pt would benefit from additional PT to further address deficits.    OBJECTIVE IMPAIRMENTS: Abnormal gait, decreased activity tolerance, decreased coordination, decreased endurance, decreased mobility, difficulty walking, decreased strength, increased fascial restrictions, increased muscle spasms, impaired flexibility, impaired sensation, improper body mechanics, postural dysfunction, and pain.   ACTIVITY LIMITATIONS: carrying, lifting, bending, sitting, standing, squatting, stairs, transfers, bed  mobility, locomotion level, and caring for others  PARTICIPATION LIMITATIONS: meal prep, cleaning, laundry, interpersonal relationship, driving,  shopping, community activity, occupation, and yard work  PERSONAL FACTORS: Fitness, Past/current experiences, Time since onset of injury/illness/exacerbation, and 1 comorbidity: medical history are also affecting patient's functional outcome.   REHAB POTENTIAL: Good  CLINICAL DECISION MAKING: Evolving/moderate complexity  EVALUATION COMPLEXITY: Moderate   GOALS: Goals reviewed with patient? Yes  SHORT TERM GOALS: Target date: 02/13/24 Pt to be I with HEP for carry over and continuing recommendations for improved outcomes.   Baseline: Goal status: MET  2.  Pt to demonstrate at least 3/5 Rt LE strength grossly and 4/5 Lt for improved pelvic stability and functional squats without falling.  Baseline:  Goal status: IPARTIALLY MET 03/03/24  3.  Pt to demonstrate ability to complete static standing without AD for at least 5 mins at midline to complete gentle kitchen tasks/household tasks without fear of falling.  Baseline:  Goal status: MET  4.  Pt to demonstrate I with dynamic sitting balance to better be able to reach floor level for retrieving fallen objects and putting on socks without falling Baseline:  Goal status: MET 03/03/24  LONG TERM GOALS: Target date: 07/17/24  Pt to be I with advanced HEP for carry over and continuing recommendations for improved outcomes.   Baseline:  Goal status: INITIAL  2.  Pt to report improved LEFS score to at least 30/80 for improved I with functional tasks at home such as dressing, walking from room to room.  Baseline:  Goal status: INITIAL  3.  Pt to demonstrate improved dynamic standing balance with or without AD at no more than CGA to better be able to play with grandchildren without falling.  Baseline:  Goal status: INITIAL  4.  Pt to demonstrate improved gait mechanics with LRAD for 150' with  minimal compensate strategies to I with pt safety with ambulating into a store.  Baseline:  Goal status: INITIAL  5.  Pt to demonstrate ability to perform x5 Sit to stand with at least 10# to more safely assist in caring for grandchildren without risk of falling.  Baseline:  Goal status: INITIAL    PLAN:  PT FREQUENCY: 2x/week  PT DURATION: 12 weeks  PLANNED INTERVENTIONS:    97110-Therapeutic exercises, 97530- Therapeutic activity, [id number]- Neuromuscular re-education, 97535- Self Care, [id number]- Manual therapy, [phone]- Gait training, [phone]- Canalith repositioning, [id number]- Aquatic Therapy, [phone]- Splinting, [id number]- Electrical stimulation (manual), [id number]- Vasopneumatic device, [id number]- Ultrasound, [id number]- Traction (mechanical), [id number]- Ionotophoresis 4mg /ml Dexamethasone , Patient/Family education, Balance training, Stair training, Taping, Dry Needling, Joint mobilization, Joint manipulation, Spinal manipulation, Spinal mobilization, Scar mobilization, DME instructions, Wheelchair mobility training, Cryotherapy, Moist heat, and Biofeedback  NEXT VISIT: for aquatic PT, core and LE strengthening and neuromuscular re-ed  for LAND: continue with resisted walking, focused standing glute med activities, hip ADDuctors and flexors   Markesha Hannig, PT 06/09/24 8:46 AM  Phone: [phone] Fax: [phone]

## 2024-06-11 ENCOUNTER — Ambulatory Visit: Admitting: Physical Therapy

## 2024-06-11 ENCOUNTER — Encounter: Payer: Self-pay | Admitting: Physical Therapy

## 2024-06-11 DIAGNOSIS — M6281 Muscle weakness (generalized): Secondary | ICD-10-CM

## 2024-06-11 DIAGNOSIS — M62838 Other muscle spasm: Secondary | ICD-10-CM

## 2024-06-11 DIAGNOSIS — R293 Abnormal posture: Secondary | ICD-10-CM

## 2024-06-11 DIAGNOSIS — R279 Unspecified lack of coordination: Secondary | ICD-10-CM

## 2024-06-11 DIAGNOSIS — R102 Pelvic and perineal pain: Secondary | ICD-10-CM

## 2024-06-11 DIAGNOSIS — R269 Unspecified abnormalities of gait and mobility: Secondary | ICD-10-CM

## 2024-06-11 NOTE — Therapy (Signed)
 OUTPATIENT PHYSICAL THERAPY FEMALE PELVIC AND ORTHO TREATMENT   Patient Name: Anna Ramsey MRN: [id number] DOB:[birth year], 58 y.o., female Today's Date: 06/11/2024  END OF SESSION:  PT End of Session - 06/11/24 0757     Visit Number 30    Date for PT Re-Evaluation 07/17/24    Authorization Type [employer]    PT Start Time 0757    PT Stop Time 0855    PT Time Calculation (min) 58 min    Activity Tolerance Patient tolerated treatment well    Behavior During Therapy [facility] for tasks assessed/performed               Past Medical History:  Diagnosis Date   ADHD (attention deficit hyperactivity disorder) 2024   Dysuria    Erythematous bladder mucosa    Heart murmur    asymptomatic per pt --  no echo   Hematuria    History of cervical cancer    12/ 2014  Stage IB2--  s/p  TAH w/ BSO and pelvic lymphadectomy/  and radiation therapy   History of radiation therapy 10/20/13-11/28/13   pelvis 50.4 gray   Hypertension    Lesion of bladder    Mild intermittent asthma    Neuromuscular disorder (HCC)    Seasonal allergies    Thyroid disease    Past Surgical History:  Procedure Laterality Date   CYSTOSCOPY WITH BIOPSY N/A 04/29/2015   Procedure: CYSTOSCOPY WITH BIOPSY WITH FULGERATION;  Surgeon: Belvie LITTIE Clara, MD;  Location: [facility];  Service: Urology;  Laterality: N/A;  1 HR [id number]   FOOT SURGERY Left 2010   RADICAL ABDOMINAL HYSTERECTOMY  09-05-2013   w/ BILATERAL SALPINGOOPHORECTOMY AND PELVIC LYMPHADECTOMY   Patient Active Problem List   Diagnosis Date Noted   ADD (attention deficit disorder) 04/21/2024   SUI (stress urinary incontinence, female) 11/01/2023   History of urinary retention 11/01/2023   Neurogenic bladder 10/18/2023   Hematuria 10/18/2023   Acute urinary retention 10/18/2023   Stress and adjustment reaction 01/13/2021   Inattention 10/28/2019   Hypothyroid 11/13/2016   Neuropathy involving both lower  extremities 11/13/2016   Cystitis, radiation 05/22/2014   Cervical cancer (HCC) 09/01/2013   Adenocarcinoma of cervix, 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   Asthma, mild intermittent 07/07/2009   INSOMNIA 05/28/2008   Generalized anxiety disorder 05/07/2008   HYPERTENSION, BENIGN 05/31/2007    PCP: Geraline Dorothyann JONETTA, MD  REFERRING PROVIDER: Skeet Juliene SAUNDERS, DO   REFERRING DIAG:  [phone] (ICD-10-CM) - Bilateral leg weakness G62.89 (ICD-10-CM) - 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 No complaints.   Eval: Right leg worse than left but both painful. Right has instances of knee just giving out, has neuropathy - like weakness, pain since radiation in 2015. The nerves have been affected and greatly limiting her activity. Does have symptoms of numbness, sensory deficits, weakness, ataxic gait, and painful but all vary based on the day.     Fluid intake: Yes: water    PAIN:  Are you having pain? No NPRS scale:0 Pain location: bil legs Right leg  Pain type: aching Pain description: constant   Aggravating factors: walking, end of day, rainy days Relieving factors: try not to walk, decreasing activities    PRECAUTIONS: Other: cervical cancer with radiation  RED FLAGS: None   WEIGHT BEARING RESTRICTIONS: No  FALLS:  Has patient fallen in last 6 months? Yes. Number of falls 1x/weekly due to weakness and  decreased balance from having the radiation, trouble with low light - reports she does need to furniture walk to help with balance  LIVING ENVIRONMENT: Lives with: lives with their family  OCCUPATION: sits with laptop on her lap for 12 hours per day; unable to sit with legs down due to them falling asleep  PLOF: Independent  PATIENT GOALS: to strengthening enough to walk across grass, wants to have better balance, stronger legs                                        PERTINENT HISTORY:  ADHD; Cervical Cancer 2014; 9 weeks of radiation; Radical hysterectomy;    OBJECTIVE:  Note: Objective measures were completed at Evaluation unless otherwise noted.  DIAGNOSTIC FINDINGS:   PATIENT SURVEYS:  Lower Extremity Functional Score: 5 / 80 = 6.3 %  COGNITION: Overall cognitive status: Within functional limits for tasks assessed     SENSATION: Poor sensation at Rt LE - light touch felt along posterior gastroc only no awareness without visualization of being touched at medial, lateral, anterior lower leg, reports numbness in bil feet Rt>Lt  EDEMA:  Rt ankle sometimes swells per pt  MUSCLE LENGTH: Bil hamstrings and adductors limited by 25%;  POSTURE: rounded shoulders and forward head  LOWER EXTREMITY ROM:  weakness and poor coordination/proprioception   LOWER EXTREMITY MMT:  02/04/24 hip flexion was R 3/5, L 4/5  MMT Right Eval */5 Left Eval */5 Right 5/16 Left 5/16 RIGHT 6/6 LEFT  6/6  Hip flexion 2 3 4+ 5 3   Hip extension 2 3 4+ 4 4+ 4  Hip abduction 3 3+ 5- 5 5   Hip adduction 1+ 2+ 2+ 2+ 2+ 2+  Hip internal rotation        Hip external rotation        Knee flexion 2 3 3- 4 3+ 5  Knee extension 2+ 3 3- 5 3+   Ankle dorsiflexion 0 4 0 4+ long sitting 2 4  4+ long sitting 2    Ankle plantarflexion 3 4      Ankle inversion 1 4 3+ tested in long sitting 4+ tested in long sitting 3+ in long sitting 5 in long sitting  Ankle eversion 2 4 3+ tested in long sitting 4-  tested in long sitting 2+ 4- tested in long sitting   (Blank rows = not tested)   FUNCTIONAL TESTS:  Five times Sit to Stand Test (FTSS) Method: Use a straight back chair  with a solid seat that is 16-18" high. Ask participant to sit on the chair with arms folded across their chest.   Instructions: "Stand up and sit down as quickly as possible 5 times, keeping your arms folded across your chest."   Measurement: Stop timing when the participant stands the 5th time.  TIME: ______ (in seconds)  Times > 13.6 seconds is associated with increased disability and morbidity (Guralnik, 2000) Times > 15 seconds is predictive of recurrent falls in healthy individuals aged 6 and older (Buatois, et al., 2008) Normal performance values in community dwelling individuals aged 60 and older (Bohannon, 2006): 50-59 years: 8 seconds 60-69 years: 11.4 seconds 70-79 years: 12.6 seconds 80-89 years: 14.8 seconds  MCID: >= 2.3 seconds for Vestibular Disorders (Meretta, 2006)  5 times sit to stand: 22s with hands - very unstable with poor hip, posture, core control  Timed up and go (TUG): 28s with SBQC - min A  GAIT: Distance walked: 200' Assistive device utilized: Counselling psychologist Level of assistance: CGA Comments: Rt LE demonstrating ataxic pattern, Lt trunk lean intermittently  to right self, poor step height bil, decreased stride length, limited glute activation                                                                                                                                 TREATMENT DATE:   06/11/24:Pt arrives for aquatic physical therapy. Treatment took place in 3.5-5.5 feet of water . Water  temperature was 91 degrees f. Pt entered the pool via steps independently with use of bil rails. Pt requires buoyancy of water  for support and to offload joints with strengthening exercises.  Pt utilizes viscosity of the water  required for strengthening. Seated water  bench with 75%  submersion Pt performed seated LE AROM exercises 20x in all planes, concurrent review of status with pt.  -60% water  depth water  walking with Qtip 8x each direction with Vc for control of LE and focus on heel -toe walking.as best she can. -Standing core with pink water  bells 3x10 -Hip 3 ways with ankle fins 2x20 -Rainbow floats pushing down by herside/hold and walk tall 4 lengths - Seated decompression with yellow noodle for LE and pelvic floor relaxation  06/09/24: NuStep seat 4 arms 9 L5 x 7' (PT present to discuss how trip went with getting down to beach and back) Semi-reclined to wedge: ball squeeze 10x5, bridge with ball squeeze 10x5, Rt LE quad and glut set imprint press into mat table 10x5, red ball push down for core activation 10x5, red ball roll ups for core x10 Sidelying: side planks (elbow and knees)5x 5 sec hold right/left Sidelying: hip abduction 10x right/left Seated sit ups with OH press 5lb x10, then slow seated 5lb russian twist x12, then fast russian twist x12 - Pto provided knee stabilization for core focus Sit to stand min A x3  05/27/24: Nu step seat 4,UE 9, Lev 5 x 6 min while  discussing status including new right 5th phalanx fracture Propped supine on wedge:  ball squeeze between knees 7x Propped supine on wedge: purple ball between knees with bridge 5 sec hold 10x Propped supine on wedge: red ball UE push down to activate core 5 sec hold 10x Propped supine on wedge: red ball roll up thighs  10x Sidelying: side planks (elbow and knees)5x 5 sec hold right/left Sidelying: hip abduction 10x right/left Sidelying: therapist providing manual resisted (progressively more resistance) hip extension 10x right/left Sidelying: min assisted hip circles clockwise and counterclockwise 5x right/left Supine black band crossed over shoe and behind calf: pt holding ends and pushing down for hip extension 10x right/left (therapist holding right foot in neutral)  Happy baby  stretch Discussed wearing her AFO to provide 5th toe support and stabilization  PATIENT EDUCATION:  Education details: [email or web address] - uses App Person educated: Patient Education method: Explanation, Demonstration, Tactile cues, Verbal cues, and Handouts Education comprehension: verbalized understanding, returned demonstration, verbal cues required, tactile cues required, and needs further education  HOME EXERCISE PROGRAM: Access Code: [email or web address] URL: [email or web address] Date: 04/07/2024 Prepared by: Mliss  Exercises - Supine Heel Slide with Strap  - 1 x daily - 7 x weekly - 3 sets - 10 reps - Pillow squeezes with kegel  - 1 x daily - 7 x weekly - 3 sets - 10 reps - Sit to Stand with Counter Support  - 1 x daily - 7 x weekly - 1 sets - 10 reps - Seated Long Arc Quad  - 1 x daily - 7 x weekly - 1-3 sets - 10 reps - 5 sec hold - Seated Isometric Hip Abduction with Resistance  - 1 x daily - 7 x weekly - 1-3 sets - 10 reps - Heel Raises with Counter Support  - 1 x daily - 7 x weekly - 1-3 sets - 10 reps - Standing Terminal Knee Extension at Wall with Ball  - 2 x daily - 7 x weekly - 2 sets - 10 reps - 5 sec hold - Squat with Chair Touch and Resistance Loop  - 1 x daily - 3 x weekly - 2 sets - 10 reps - Standing Knee Flexion with Counter Support  - 1 x daily - 3 x weekly - 2 sets - 10 reps - Supine Bridge with Resistance Band  - 1 x daily - 7 x weekly - 1-2 sets - 10 reps - 5 sec hold - Standing Hip Abduction on Slider  - 1 x daily - 3 x weekly - 2 sets - 10 reps - Calf stretch on rocker board  - 1 x daily - 7 x weekly - 1 sets - 3 reps - 30 sec  hold - Standing Anti-Rotation Press with Anchored Resistance  - 1 x daily - 3 x weekly - 2 sets - 10 reps - Standing Trunk Rotation with Resistance  - 1 x daily - 3 x weekly - 2 sets - 10 reps  ASSESSMENT:  CLINICAL IMPRESSION: Pt returns to aquatic PT after toe fracture and vacation. It took a few minutes for pt to get her movements  coordinated in the water  but with time she was able to perform her exercises with correct muscle contractions and proper speed control. No pain, 2.5# ankle wt on RTLE very helpful to increase proprioception through that leg and ankle.     Eval: Patient is a 58 y.o. female  who was seen today for physical therapy evaluation and tr in  the teatment for poor gait mechanics, impaired posture, impaired balance, decreased hip and core strength, increased fall risk. Pt has complex history of  Radiation-induced lumbosacral plexopathy and Polyradiculoneuropathy status post cervical cancer. Pt has completed pelvic floor PT recently and had positive outcomes with this and resolution of symptoms. Pt very motivated to participate in PT for bil LE strengthening and decreased fall risk. Pt demonstrated impairments with objective measurements of TUG and 5xSTS indicative of increased fall risk and balance deficits. Pt reports she is unable to hold and carry grandchildren, to walk on uneven surfaces, needs assistance with dressing, walking, transfers sometimes and wants to be more I. Pt would benefit from additional PT to further address deficits.    OBJECTIVE IMPAIRMENTS: Abnormal gait, decreased activity tolerance, decreased coordination, decreased endurance, decreased mobility, difficulty walking, decreased strength, increased fascial restrictions, increased muscle spasms, impaired flexibility, impaired sensation, improper body mechanics, postural dysfunction, and pain.   ACTIVITY LIMITATIONS: carrying, lifting, bending, sitting, standing, squatting, stairs, transfers, bed mobility, locomotion level, and caring for others  PARTICIPATION LIMITATIONS: meal prep, cleaning, laundry, interpersonal relationship, driving, shopping, community activity, occupation, and yard work  PERSONAL FACTORS: Fitness, Past/current experiences, Time since onset of injury/illness/exacerbation, and 1 comorbidity: medical history are also  affecting patient's functional outcome.   REHAB POTENTIAL: Good  CLINICAL DECISION MAKING: Evolving/moderate complexity  EVALUATION COMPLEXITY: Moderate   GOALS: Goals reviewed with patient? Yes  SHORT TERM GOALS: Target date: 02/13/24 Pt to be I with HEP for carry over and continuing recommendations for improved outcomes.   Baseline: Goal status: MET  2.  Pt to demonstrate at least 3/5 Rt LE strength grossly and 4/5 Lt for improved pelvic stability and functional squats without falling.  Baseline:  Goal status: IPARTIALLY MET 03/03/24  3.  Pt to demonstrate ability to complete static standing without AD for at least 5 mins at midline to complete gentle kitchen tasks/household tasks without fear of falling.  Baseline:  Goal status: MET  4.  Pt to demonstrate I with dynamic sitting balance to better be able to reach floor level for retrieving fallen objects and putting on socks without falling Baseline:  Goal status: MET 03/03/24  LONG TERM GOALS: Target date: 07/17/24  Pt to be I with advanced HEP for carry over and continuing recommendations for improved outcomes.   Baseline:  Goal status: INITIAL  2.  Pt to report improved LEFS score to at least 30/80 for improved I with functional tasks at home such as dressing, walking from room to room.  Baseline:  Goal status: INITIAL  3.  Pt to demonstrate improved dynamic standing balance with or without AD at no more than CGA to better be able to play with grandchildren without falling.  Baseline:  Goal status: INITIAL  4.  Pt to demonstrate improved gait mechanics with LRAD for 150' with minimal compensate strategies to I with pt safety with ambulating into a store.  Baseline:  Goal status: INITIAL  5.  Pt to demonstrate ability to perform x5 Sit to stand with at least 10# to more safely assist in caring for grandchildren without risk of falling.  Baseline:  Goal status: INITIAL    PLAN:  PT FREQUENCY: 2x/week  PT  DURATION: 12 weeks  PLANNED INTERVENTIONS:    97110-Therapeutic exercises, 97530- Therapeutic activity, [id number]- Neuromuscular re-education, 97535- Self Care, [id number]- Manual therapy, [id number]- Gait training, [phone]- Canalith repositioning, [id number]- Aquatic Therapy, 97760- Splinting, [id number]- Electrical stimulation (manual), [id number]- Vasopneumatic device, [id number]- Ultrasound, [id number]- Traction (mechanical), [id number]-  Ionotophoresis 4mg /ml Dexamethasone , Patient/Family education, Balance training, Stair training, Taping, Dry Needling, Joint mobilization, Joint manipulation, Spinal manipulation, Spinal mobilization, Scar mobilization, DME instructions, Wheelchair mobility training, Cryotherapy, Moist heat, and Biofeedback  NEXT VISIT: for aquatic PT, core and LE strengthening and neuromuscular re-ed  for LAND: continue with resisted walking, focused standing glute med activities, hip ADDuctors and flexors   Delon Darner, PTA 06/11/24 8:50 AM  Phone: [phone] Fax: [phone]

## 2024-06-15 ENCOUNTER — Encounter: Payer: Self-pay | Admitting: Family Medicine

## 2024-06-15 NOTE — Therapy (Unsigned)
 OUTPATIENT PHYSICAL THERAPY FEMALE PELVIC AND ORTHO TREATMENT   Patient Name: Anna Ramsey MRN: [id number] DOB:07/11/1966, 58 y.o., female Today's Date: 06/16/2024  END OF SESSION:  PT End of Session - 06/16/24 0758     Visit Number 31    Date for Recertification  07/17/24    Authorization Type [employer]    PT Start Time 0759    PT Stop Time 0840    PT Time Calculation (min) 41 min    Activity Tolerance Patient tolerated treatment well    Behavior During Therapy [facility] for tasks assessed/performed                Past Medical History:  Diagnosis Date   ADHD (attention deficit hyperactivity disorder) 2024   Dysuria    Erythematous bladder mucosa    Heart murmur    asymptomatic per pt --  no echo   Hematuria    History of cervical cancer    12/ 2014  Stage IB2--  s/p  TAH w/ BSO and pelvic lymphadectomy/  and radiation therapy   History of radiation therapy 10/20/13-11/28/13   pelvis 50.4 gray   Hypertension    Lesion of bladder    Mild intermittent asthma    Neuromuscular disorder (HCC)    Seasonal allergies    Thyroid disease    Past Surgical History:  Procedure Laterality Date   CYSTOSCOPY WITH BIOPSY N/A 04/29/2015   Procedure: CYSTOSCOPY WITH BIOPSY WITH FULGERATION;  Surgeon: Belvie LITTIE Clara, MD;  Location: [facility];  Service: Urology;  Laterality: N/A;  1 HR [phone] [id number]   FOOT SURGERY Left 2010   RADICAL ABDOMINAL HYSTERECTOMY  09-05-2013   w/ BILATERAL SALPINGOOPHORECTOMY AND PELVIC LYMPHADECTOMY   Patient Active Problem List   Diagnosis Date Noted   ADD (attention deficit disorder) 04/21/2024   SUI (stress urinary incontinence, female) 11/01/2023   History of urinary retention 11/01/2023   Neurogenic bladder 10/18/2023   Hematuria 10/18/2023   Acute urinary retention 10/18/2023   Stress and adjustment reaction 01/13/2021   Inattention 10/28/2019   Hypothyroid 11/13/2016   Neuropathy involving both lower  extremities 11/13/2016   Cystitis, radiation 05/22/2014   Cervical cancer (HCC) 09/01/2013   Adenocarcinoma of cervix, 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   Asthma, mild intermittent 07/07/2009   INSOMNIA 05/28/2008   Generalized anxiety disorder 05/07/2008   HYPERTENSION, BENIGN 05/31/2007    PCP: Geraline Dorothyann JONETTA, MD  REFERRING PROVIDER: Skeet Juliene SAUNDERS, DO   REFERRING DIAG:  [phone] (ICD-10-CM) - Bilateral leg weakness G62.89 (ICD-10-CM) - 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 She is able to sit with legs extended without experiencing the numbness.   Eval: Right leg worse than left but both painful. Right has instances of knee just giving out, has neuropathy - like weakness, pain since radiation in 2015. The nerves have been affected and greatly limiting her activity. Does have symptoms of numbness, sensory deficits, weakness, ataxic gait, and painful but all vary based on the day.     Fluid intake: Yes: water    PAIN:  Are you having pain? No NPRS scale:0 Pain location: bil legs Right leg  Pain type: aching Pain description: constant   Aggravating factors: walking, end of day, rainy days Relieving factors: try not to walk, decreasing activities    PRECAUTIONS: Other: cervical cancer with radiation  RED FLAGS: None   WEIGHT BEARING RESTRICTIONS: No  FALLS:  Has patient fallen in last 6 months? Yes. Number of  falls 1x/weekly due to weakness and decreased balance from having the radiation, trouble with low light - reports she does need to furniture walk to help with balance  LIVING ENVIRONMENT: Lives with: lives with their family  OCCUPATION: sits with laptop on her lap for 12 hours per day; unable to sit with legs down due to them falling asleep  PLOF: Independent  PATIENT GOALS: to strengthening enough to walk across grass, wants to have better balance, stronger legs                                        PERTINENT HISTORY:  ADHD; Cervical Cancer 2014; 9 weeks of radiation; Radical hysterectomy;    OBJECTIVE:  Note: Objective measures were completed at Evaluation unless otherwise noted.  DIAGNOSTIC FINDINGS:   PATIENT SURVEYS:  Lower Extremity Functional Score: 5 / 80 = 6.3 % 06/16/24 LEFS 28 / 80 = 35.0 %  COGNITION: Overall cognitive status: Within functional limits for tasks assessed     SENSATION: Poor sensation at Rt LE - light touch felt along posterior gastroc only no awareness without visualization of being touched at medial, lateral, anterior lower leg, reports numbness in bil feet Rt>Lt  EDEMA:  Rt ankle sometimes swells per pt  MUSCLE LENGTH: Bil hamstrings and adductors limited by 25%;  POSTURE: rounded shoulders and forward head  LOWER EXTREMITY ROM:  weakness and poor coordination/proprioception   LOWER EXTREMITY MMT:  02/04/24 hip flexion was R 3/5, L 4/5  MMT Right Eval */5 Left Eval */5 Right 5/16 Left 5/16 RIGHT 6/6 LEFT  6/6  Hip flexion 2 3 4+ 5 3   Hip extension 2 3 4+ 4 4+ 4  Hip abduction 3 3+ 5- 5 5   Hip adduction 1+ 2+ 2+ 2+ 2+ 2+  Hip internal rotation        Hip external rotation        Knee flexion 2 3 3- 4 3+ 5  Knee extension 2+ 3 3- 5 3+   Ankle dorsiflexion 0 4 0 4+ long sitting 2 4  4+ long sitting 2    Ankle plantarflexion 3 4      Ankle inversion 1 4 3+ tested in long sitting 4+ tested in long sitting 3+ in long sitting 5  in long sitting  Ankle eversion 2 4 3+ tested in long sitting 4- tested in long sitting 2+ 4- tested in long  sitting   (Blank rows = not tested)   FUNCTIONAL TESTS:  Five times Sit to Stand Test (FTSS) Method: Use a straight back chair with a solid seat that is 16-18" high. Ask participant to sit on the chair with arms folded across their chest.   Instructions: "Stand up and sit down as quickly as possible 5 times, keeping your arms folded across your chest."   Measurement: Stop timing when the participant stands the 5th time.  TIME: ______ (in seconds)  Times > 13.6 seconds is associated with increased disability and morbidity (Guralnik, 2000) Times > 15 seconds is predictive of recurrent falls in healthy individuals aged 56 and older (Buatois, et al., 2008) Normal performance values in community dwelling individuals aged 99 and older (Bohannon, 2006): 50-59 years: 8 seconds 60-69 years: 11.4 seconds 70-79 years: 12.6 seconds 80-89 years: 14.8 seconds  MCID: >= 2.3 seconds for Vestibular Disorders (Meretta, 2006)  5 times sit to stand: 22s with hands - very unstable with poor hip, posture, core control  Timed up and go (TUG): 28s with SBQC - min A  GAIT: Distance walked: 200' Assistive device utilized: Quad cane small base Level of assistance: CGA Comments: Rt LE demonstrating ataxic pattern, Lt trunk lean intermittently  to right self, poor step height bil, decreased stride length, limited glute activation                                                                                                                                 TREATMENT DATE:  06/16/24 LEFS see above Gait x 266 ft with SPC  NuStep seat 4 arms 9 L3 x 8' (PT present to discuss how trip went with getting down to beach and back) Semi-reclined to wedge: ball squeeze 10x5, bridge with ball squeeze 10x5, Rt LE quad and glut set imprint press into mat table 10x5, red ball push down for core activation  10x5, red ball roll ups for core x10 Sidelying: side planks (elbow and knees)5x 5 sec hold right/left Modified plank on elbows max hold  - a few reps Sidelying: hip abduction 10x right/left Seated sit ups with OH press 5lb x10, then slow seated 5lb russian twist x12, then fast russian twist x12 - Pt provided knee stabilization for core focus Sit to stand min A x5   06/11/24:Pt arrives for aquatic physical therapy. Treatment took place in 3.5-5.5 feet of water . Water  temperature was 91 degrees f. Pt entered the pool via steps independently with use of bil rails. Pt requires buoyancy of water  for support and to offload joints with strengthening exercises.  Pt utilizes viscosity of the water  required for strengthening. Seated water  bench with 75% submersion Pt performed seated LE AROM exercises 20x in all planes, concurrent review of status with pt.  -60% water  depth water  walking with Qtip 8x each direction with Vc for control of LE and focus on heel -toe walking.as best she can. -Standing core with  pink water  bells 3x10 -Hip 3 ways with ankle fins 2x20 -Rainbow floats pushing down by herside/hold and walk tall 4 lengths - Seated decompression with yellow noodle for LE and pelvic floor relaxation  06/09/24: NuStep seat 4 arms 9 L5 x 7' (PT present to discuss how trip went with getting down to beach and back) Semi-reclined to wedge: ball squeeze 10x5, bridge with ball squeeze 10x5, Rt LE quad and glut set imprint press into mat table 10x5, red ball push down for core activation 10x5, red ball roll ups for core x10 Sidelying: side planks (elbow and knees)5x 5 sec hold right/left Sidelying: hip abduction 10x right/left Seated sit ups with OH press 5lb x10, then slow seated 5lb russian twist x12, then fast russian twist x12 - Pto provided knee stabilization for core focus Sit to stand min A x3  05/27/24: Nu step seat 4,UE 9, Lev 5 x 6 min while discussing status including new right 5th phalanx  fracture Propped supine on wedge:  ball squeeze between knees 7x Propped supine on wedge: purple ball between knees with bridge 5 sec hold 10x Propped supine on wedge: red ball UE push down to activate core 5 sec hold 10x Propped supine on wedge: red ball roll up thighs  10x Sidelying: side planks (elbow and knees)5x 5 sec hold right/left Sidelying: hip abduction 10x right/left Sidelying: therapist providing manual resisted (progressively more resistance) hip extension 10x right/left Sidelying: min assisted hip circles clockwise and counterclockwise 5x right/left Supine black band crossed over shoe and behind calf: pt holding ends and pushing down for hip extension 10x right/left (therapist holding right foot in neutral)  Happy baby stretch Discussed wearing her AFO to provide 5th toe support and stabilization  PATIENT EDUCATION:  Education details: [email or web address] - uses App Person educated: Patient Education method: Explanation, Demonstration, Tactile cues, Verbal cues, and Handouts Education comprehension: verbalized understanding, returned demonstration, verbal cues required, tactile cues required, and needs further education  HOME EXERCISE PROGRAM: Access Code: [email or web address] URL: [email or web address] Date: 04/07/2024 Prepared by: Mliss  Exercises - Supine Heel Slide with Strap  - 1 x daily - 7 x weekly - 3 sets - 10 reps - Pillow squeezes with kegel  - 1 x daily - 7 x weekly - 3 sets - 10 reps - Sit to Stand with Counter Support  - 1 x daily - 7 x weekly - 1 sets - 10 reps - Seated Long Arc Quad  - 1 x daily - 7 x weekly - 1-3 sets - 10 reps - 5 sec hold - Seated Isometric Hip Abduction with Resistance  - 1 x daily - 7 x weekly - 1-3 sets - 10 reps - Heel Raises with Counter Support  - 1 x daily - 7 x weekly - 1-3 sets - 10 reps - Standing Terminal Knee Extension at Wall with Ball  - 2 x daily - 7 x weekly - 2 sets - 10 reps - 5 sec hold - Squat with Chair Touch and  Resistance Loop  - 1 x daily - 3 x weekly - 2 sets - 10 reps - Standing Knee Flexion with Counter Support  - 1 x daily - 3 x weekly - 2 sets - 10 reps - Supine Bridge with Resistance Band  - 1 x daily - 7 x weekly - 1-2 sets - 10 reps - 5 sec hold - Standing Hip Abduction on Slider  - 1 x daily - 3 x weekly - 2 sets - 10  reps - Calf stretch on rocker board  - 1 x daily - 7 x weekly - 1 sets - 3 reps - 30 sec  hold - Standing Anti-Rotation Press with Anchored Resistance  - 1 x daily - 3 x weekly - 2 sets - 10 reps - Standing Trunk Rotation with Resistance  - 1 x daily - 3 x weekly - 2 sets - 10 reps  ASSESSMENT:  CLINICAL IMPRESSION: ***     Eval: Patient is a 58 y.o. female  who was seen today for physical therapy evaluation and tr in the teatment for poor gait mechanics, impaired posture, impaired balance, decreased hip and core strength, increased fall risk. Pt has complex history of  Radiation-induced lumbosacral plexopathy and Polyradiculoneuropathy status post cervical cancer. Pt has completed pelvic floor PT recently and had positive outcomes with this and resolution of symptoms. Pt very motivated to participate in PT for bil LE strengthening and decreased fall risk. Pt demonstrated impairments with objective measurements of TUG and 5xSTS indicative of increased fall risk and balance deficits. Pt reports she is unable to hold and carry grandchildren, to walk on uneven surfaces, needs assistance with dressing, walking, transfers sometimes and wants to be more I. Pt would benefit from additional PT to further address deficits.    OBJECTIVE IMPAIRMENTS: Abnormal gait, decreased activity tolerance, decreased coordination, decreased endurance, decreased mobility, difficulty walking, decreased strength, increased fascial restrictions, increased muscle spasms, impaired flexibility, impaired sensation, improper body mechanics, postural dysfunction, and pain.   ACTIVITY LIMITATIONS: carrying, lifting,  bending, sitting, standing, squatting, stairs, transfers, bed mobility, locomotion level, and caring for others  PARTICIPATION LIMITATIONS: meal prep, cleaning, laundry, interpersonal relationship, driving, shopping, community activity, occupation, and yard work  PERSONAL FACTORS: Fitness, Past/current experiences, Time since onset of injury/illness/exacerbation, and 1 comorbidity: medical history are also affecting patient's functional outcome.   REHAB POTENTIAL: Good  CLINICAL DECISION MAKING: Evolving/moderate complexity  EVALUATION COMPLEXITY: Moderate   GOALS: Goals reviewed with patient? Yes  SHORT TERM GOALS: Target date: 02/13/24 Pt to be I with HEP for carry over and continuing recommendations for improved outcomes.   Baseline: Goal status: MET  2.  Pt to demonstrate at least 3/5 Rt LE strength grossly and 4/5 Lt for improved pelvic stability and functional squats without falling.  Baseline:  Goal status: IPARTIALLY MET 03/03/24  3.  Pt to demonstrate ability to complete static standing without AD for at least 5 mins at midline to complete gentle kitchen tasks/household tasks without fear of falling.  Baseline:  Goal status: MET  4.  Pt to demonstrate I with dynamic sitting balance to better be able to reach floor level for retrieving fallen objects and putting on socks without falling Baseline:  Goal status: MET 03/03/24  LONG TERM GOALS: Target date: 07/17/24  Pt to be I with advanced HEP for carry over and continuing recommendations for improved outcomes.   Baseline:  Goal status: INITIAL  2.  Pt to report improved LEFS score to at least 30/80 for improved I with functional tasks at home such as dressing, walking from room to room.  Baseline:  Goal status: INITIAL  3.  Pt to demonstrate improved dynamic standing balance with or without AD at no more than CGA to better be able to play with grandchildren without falling.  Baseline:  Goal status: INITIAL  4.  Pt to  demonstrate improved gait mechanics with LRAD for 150' with minimal compensate strategies to I with pt safety with ambulating into a  store.  Baseline:  Goal status: INITIAL  5.  Pt to demonstrate ability to perform x5 Sit to stand with at least 10# to more safely assist in caring for grandchildren without risk of falling.  Baseline:  Goal status: INITIAL    PLAN:  PT FREQUENCY: 2x/week  PT DURATION: 12 weeks  PLANNED INTERVENTIONS:    97110-Therapeutic exercises, 97530- Therapeutic activity, [id number]- Neuromuscular re-education, 97535- Self Care, 02859- Manual therapy, [phone]- Gait training, [phone]- Canalith repositioning, [id number]- Aquatic Therapy, [phone]- Splinting, [id number]- Electrical stimulation (manual), [id number]- Vasopneumatic device, [id number]- Ultrasound, [id number]- Traction (mechanical), [id number]- Ionotophoresis 4mg /ml Dexamethasone , Patient/Family education, Balance training, Stair training, Taping, Dry Needling, Joint mobilization, Joint manipulation, Spinal manipulation, Spinal mobilization, Scar mobilization, DME instructions, Wheelchair mobility training, Cryotherapy, Moist heat, and Biofeedback  NEXT VISIT: for aquatic PT, core and LE strengthening and neuromuscular re-ed  for LAND: continue with resisted walking, focused standing glute med activities, hip ADDuctors and flexors   Mliss Cummins, PT  06/16/24 8:46 AM  Phone: [phone] Fax: [phone]

## 2024-06-16 ENCOUNTER — Ambulatory Visit: Admitting: Physical Therapy

## 2024-06-16 ENCOUNTER — Encounter: Payer: Self-pay | Admitting: Physical Therapy

## 2024-06-16 DIAGNOSIS — M6281 Muscle weakness (generalized): Secondary | ICD-10-CM | POA: Diagnosis not present

## 2024-06-16 DIAGNOSIS — R293 Abnormal posture: Secondary | ICD-10-CM

## 2024-06-16 DIAGNOSIS — R269 Unspecified abnormalities of gait and mobility: Secondary | ICD-10-CM

## 2024-06-17 ENCOUNTER — Other Ambulatory Visit: Payer: Self-pay | Admitting: Family Medicine

## 2024-06-17 DIAGNOSIS — R41840 Attention and concentration deficit: Secondary | ICD-10-CM

## 2024-06-17 DIAGNOSIS — F411 Generalized anxiety disorder: Secondary | ICD-10-CM

## 2024-06-17 MED ORDER — PAROXETINE HCL 40 MG PO TABS: 40.0000 mg | ORAL_TABLET | ORAL | 1 refills | Status: AC

## 2024-06-17 NOTE — Telephone Encounter (Signed)
 Meds ordered this encounter  Medications   PARoxetine  (PAXIL ) 40 MG tablet    Sig: Take 1 tablet (40 mg total) by mouth every morning.    Dispense:  90 tablet    Refill:  1

## 2024-06-17 NOTE — Telephone Encounter (Signed)
 Spoke w/pt about this medication she stated that she is no longer taking this medication and that it is on auto refill.   She did want Dr. Parnell to increase the Paxil  for her. She is currently taking 30 mg. This was discussed at her OV on 7/28. Pt reports that she has always been on this dosage. She also stated that she has weaned herself off of Adderall.   She would like this sent to [facility] on [address] in [place]

## 2024-06-18 ENCOUNTER — Encounter: Payer: Self-pay | Admitting: Physical Therapy

## 2024-06-18 ENCOUNTER — Ambulatory Visit: Admitting: Physical Therapy

## 2024-06-18 DIAGNOSIS — M62838 Other muscle spasm: Secondary | ICD-10-CM

## 2024-06-18 DIAGNOSIS — M6281 Muscle weakness (generalized): Secondary | ICD-10-CM

## 2024-06-18 DIAGNOSIS — R269 Unspecified abnormalities of gait and mobility: Secondary | ICD-10-CM

## 2024-06-18 DIAGNOSIS — R293 Abnormal posture: Secondary | ICD-10-CM

## 2024-06-18 DIAGNOSIS — R279 Unspecified lack of coordination: Secondary | ICD-10-CM

## 2024-06-18 NOTE — Therapy (Signed)
 OUTPATIENT PHYSICAL THERAPY FEMALE PELVIC AND ORTHO TREATMENT   Patient Name: Anna Ramsey MRN: [id number] DOB:03-02-1966, 58 y.o., female Today's Date: 06/18/2024  END OF SESSION:  PT End of Session - 06/18/24 0800     Visit Number 32    Date for Recertification  07/17/24    Authorization Type [employer]    PT Start Time 0759    PT Stop Time 0852    PT Time Calculation (min) 53 min    Activity Tolerance Patient tolerated treatment well    Behavior During Therapy [facility] for tasks assessed/performed                 Past Medical History:  Diagnosis Date   ADHD (attention deficit hyperactivity disorder) 2024   Dysuria    Erythematous bladder mucosa    Heart murmur    asymptomatic per pt --  no echo   Hematuria    History of cervical cancer    12/ 2014  Stage IB2--  s/p  TAH w/ BSO and pelvic lymphadectomy/  and radiation therapy   History of radiation therapy 10/20/13-11/28/13   pelvis 50.4 gray   Hypertension    Lesion of bladder    Mild intermittent asthma    Neuromuscular disorder (HCC)    Seasonal allergies    Thyroid disease    Past Surgical History:  Procedure Laterality Date   CYSTOSCOPY WITH BIOPSY N/A 04/29/2015   Procedure: CYSTOSCOPY WITH BIOPSY WITH FULGERATION;  Surgeon: Anna LITTIE Clara, MD;  Location: [facility];  Service: Urology;  Laterality: N/A;  1 HR [phone] [id number]   FOOT SURGERY Left 2010   RADICAL ABDOMINAL HYSTERECTOMY  09-05-2013   w/ BILATERAL SALPINGOOPHORECTOMY AND PELVIC LYMPHADECTOMY   Patient Active Problem List   Diagnosis Date Noted   ADD (attention deficit disorder) 04/21/2024   SUI (stress urinary incontinence, female) 11/01/2023   History of urinary retention 11/01/2023   Neurogenic bladder 10/18/2023   Hematuria 10/18/2023   Acute urinary retention 10/18/2023   Stress and adjustment reaction 01/13/2021   Inattention 10/28/2019   Hypothyroid 11/13/2016   Neuropathy involving both lower  extremities 11/13/2016   Cystitis, radiation 05/22/2014   Cervical cancer (HCC) 09/01/2013   Adenocarcinoma of cervix, 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   Asthma, mild intermittent 07/07/2009   INSOMNIA 05/28/2008   Generalized anxiety disorder 05/07/2008   HYPERTENSION, BENIGN 05/31/2007    PCP: Geraline Dorothyann JONETTA, MD  REFERRING PROVIDER: Skeet Juliene SAUNDERS, DO   REFERRING DIAG:  [phone] (ICD-10-CM) - Bilateral leg weakness G62.89 (ICD-10-CM) - 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 My legs are toast.  Eval: Right leg worse than left but both painful. Right has instances of knee just giving out, has neuropathy - like weakness, pain since radiation in 2015. The nerves have been affected and greatly limiting her activity. Does have symptoms of numbness, sensory deficits, weakness, ataxic gait, and painful but all vary based on the day.     Fluid intake: Yes: water    PAIN:  Are you having pain? No NPRS scale:0 Pain location: bil legs Right leg  Pain type: aching Pain description: constant   Aggravating factors: walking, end of day, rainy days Relieving factors: try not to walk, decreasing activities    PRECAUTIONS: Other: cervical cancer with radiation  RED FLAGS: None   WEIGHT BEARING RESTRICTIONS: No  FALLS:  Has patient fallen in last 6 months? Yes. Number of falls 1x/weekly due to weakness and  decreased balance from having the radiation, trouble with low light - reports she does need to furniture walk to help with balance  LIVING ENVIRONMENT: Lives with: lives with their family  OCCUPATION: sits with laptop on her lap for 12 hours per day; unable to sit with legs down due to them falling asleep  PLOF: Independent  PATIENT GOALS: to strengthening enough to walk across grass, wants to have better balance, stronger legs                                        PERTINENT HISTORY:  ADHD; Cervical Cancer 2014; 9 weeks of radiation; Radical hysterectomy;    OBJECTIVE:  Note: Objective measures were completed at Evaluation unless otherwise noted.  DIAGNOSTIC FINDINGS:   PATIENT SURVEYS:  Lower Extremity Functional Score: 5 / 80 = 6.3 % 06/16/24 LEFS 28 / 80 = 35.0 %  COGNITION: Overall cognitive status: Within functional limits for tasks assessed     SENSATION: Poor sensation at Rt LE - light touch felt along posterior gastroc only no awareness without visualization of being touched at medial, lateral, anterior lower leg, reports numbness in bil feet Rt>Lt  EDEMA:  Rt ankle sometimes swells per pt  MUSCLE LENGTH: Bil hamstrings and adductors limited by 25%;  POSTURE: rounded shoulders and forward head  LOWER EXTREMITY ROM:  weakness and poor coordination/proprioception   LOWER EXTREMITY MMT:  02/04/24 hip flexion was R 3/5, L 4/5  MMT Right Eval */5 Left Eval */5 Right 5/16 Left 5/16 RIGHT 6/6 LEFT  6/6  Hip flexion 2 3 4+ 5 3   Hip extension 2 3 4+ 4 4+ 4  Hip abduction 3 3+ 5- 5 5   Hip adduction 1+ 2+ 2+ 2+ 2+ 2+  Hip internal rotation        Hip external rotation        Knee flexion 2 3 3- 4 3+ 5  Knee extension 2+ 3 3- 5 3+   Ankle dorsiflexion 0 4 0 4+ long sitting 2 4  4+ long sitting 2    Ankle plantarflexion 3 4      Ankle inversion 1 4 3+ tested in long sitting 4+ tested in long sitting 3+ in long sitting 5 in long sitting  Ankle eversion 2  4 3+ tested in long sitting 4- tested in long sitting 2+ 4- tested in long sitting   (Blank rows = not tested)   FUNCTIONAL TESTS:  Five times  Sit to Stand Test (FTSS) Method: Use a straight back chair with a solid seat that is 16-18" high. Ask participant to sit on the chair with arms folded across their chest.   Instructions: "Stand up and sit down as quickly as possible 5 times, keeping your arms folded across your chest."   Measurement: Stop timing when the participant stands the 5th time.  TIME: ______ (in seconds)  Times > 13.6 seconds is associated with increased disability and morbidity (Guralnik, 2000) Times > 15 seconds is predictive of recurrent falls in healthy individuals aged 82 and older (Buatois, et al., 2008) Normal performance values in community dwelling individuals aged 46 and older (Bohannon, 2006): 50-59 years: 8 seconds 60-69 years: 11.4 seconds 70-79 years: 12.6 seconds 80-89 years: 14.8 seconds  MCID: >= 2.3 seconds for Vestibular Disorders (Meretta, 2006)  5 times sit to stand: 22s with hands - very unstable with poor hip, posture, core control  Timed up and go (TUG): 28s with SBQC - min A  GAIT: Distance walked: 200' Assistive device utilized: Quad cane small base Level of assistance: CGA Comments: Rt LE demonstrating ataxic pattern, Lt trunk lean intermittently  to right self, poor step height bil, decreased stride length, limited glute activation    06/16/24 266 ft with SPC, improved step height and step length, intermittent LOB if pt turns to speak to therapist, but pt able to right herself independently. Minimal trunk lean.                                                                                                                               TREATMENT DATE:   06/18/24:Pt arrives for aquatic physical therapy. Treatment took place in 3.5-5.5 feet of water . Water  temperature was 91 degrees f. Pt entered the pool via steps independently with use  of bil rails. Pt requires buoyancy of water  for support and to offload joints with strengthening exercises.  Pt utilizes viscosity of the water  required for strengthening. Seated water  bench with 75% submersion Pt performed seated LE AROM exercises 20x in all planes, concurrent review of status with pt.  -Began with seated decompression to give LE moment to acclimate to water  and exercise -60% water  depth water  walking with Qtip 8x each direction with Vc for control of LE and focus on heel -toe walking.as best she can.Added ankle fin on LT LE and 2.5# ankle weight on RTLE for increased proprioception and hip strenght -Standing core with pink water  bells 3x20 -Hip 3 ways with ankle fins 2x20 -Rainbow floats pushing down by herside/hold and walk tall 4 lengths - Seated decompression with yellow noodle for LE and pelvic floor relaxation  06/16/24 LEFS see above Gait x 266 ft with SPC  NuStep seat 4 arms 9 L3 x 8' (PT present to discuss how trip went with getting down to beach and back) Semi-reclined to wedge: ball squeeze 10x5, bridge with ball squeeze 10x5, Rt LE quad and glut set  imprint press into mat table 10x5, red ball push down for core activation 10x5, red ball roll ups for core x10 Sidelying: side planks (elbow and knees)5x 5 sec hold right/left Modified plank on elbows max hold  - a few reps Sidelying: hip abduction 10x right/left Seated sit ups with OH press 5lb x10, then slow seated 5lb russian twist x12, then fast russian twist x12 - Pt provided knee stabilization for core focus Sit to stand min A x5   06/11/24:Pt arrives for aquatic physical therapy. Treatment took place in 3.5-5.5 feet of water . Water  temperature was 91 degrees f. Pt entered the pool via steps independently with use of bil rails. Pt requires buoyancy of water  for support and to offload joints with strengthening exercises.  Pt utilizes viscosity of the water  required for strengthening. Seated water  bench with 75%  submersion Pt performed seated LE AROM exercises 20x in all planes, concurrent review of status with pt.  -60% water  depth water  walking with Qtip 8x each direction with Vc for control of LE and focus on heel -toe walking.as best she can. -Standing core with pink water  bells 3x10 -Hip 3 ways with ankle fins 2x20 -Rainbow floats pushing down by herside/hold and walk tall 4 lengths - Seated decompression with yellow noodle for LE and pelvic floor relaxation  PATIENT EDUCATION:  Education details: [email or web address] - uses App Person educated: Patient Education method: Explanation, Demonstration, Tactile cues, Verbal cues, and Handouts Education comprehension: verbalized understanding, returned demonstration, verbal cues required, tactile cues required, and needs further education  HOME EXERCISE PROGRAM: Access Code: [email or web address] URL: [email or web address] Date: 04/07/2024 Prepared by: Mliss  Exercises - Supine Heel Slide with Strap  - 1 x daily - 7 x weekly - 3 sets - 10 reps - Pillow squeezes with kegel  - 1 x daily - 7 x weekly - 3 sets - 10 reps - Sit to Stand with Counter Support  - 1 x daily - 7 x weekly - 1 sets - 10 reps - Seated Long Arc Quad  - 1 x daily - 7 x weekly - 1-3 sets - 10 reps - 5 sec hold - Seated Isometric Hip Abduction with Resistance  - 1 x daily - 7 x weekly - 1-3 sets - 10 reps - Heel Raises with Counter Support  - 1 x daily - 7 x weekly - 1-3 sets - 10 reps - Standing Terminal Knee Extension at Wall with Ball  - 2 x daily - 7 x weekly - 2 sets - 10 reps - 5 sec hold - Squat with Chair Touch and Resistance Loop  - 1 x daily - 3 x weekly - 2 sets - 10 reps - Standing Knee Flexion with Counter Support  - 1 x daily - 3 x weekly - 2 sets - 10 reps - Supine Bridge with Resistance Band  - 1 x daily - 7 x weekly - 1-2 sets - 10 reps - 5 sec hold - Standing Hip Abduction on Slider  - 1 x daily - 3 x weekly - 2 sets - 10 reps - Calf stretch on rocker board  - 1 x daily  - 7 x weekly - 1 sets - 3 reps - 30 sec  hold - Standing Anti-Rotation Press with Anchored Resistance  - 1 x daily - 3 x weekly - 2 sets - 10 reps - Standing Trunk Rotation with Resistance  - 1 x daily - 3 x weekly - 2 sets - 10 reps  ASSESSMENT:  CLINICAL IMPRESSION: Pt presents today with very tired LE and sore quads from her PT earlier in the week. The water  was probably most beneficial today for exercise as she was not impacted by her soreness as she performed her aquatic PT. Small increases made for proper progression with care to not push beyond what may affect her gait and safety on land.   Eval: Patient is a 58 y.o. female  who was seen today for physical therapy evaluation and tr in the teatment for poor gait mechanics, impaired posture, impaired balance, decreased hip and core strength, increased fall risk. Pt has complex history of  Radiation-induced lumbosacral plexopathy and Polyradiculoneuropathy status post cervical cancer. Pt has completed pelvic floor PT recently and had positive outcomes with this and resolution of symptoms. Pt very motivated to participate in PT for bil LE strengthening and decreased fall risk. Pt demonstrated impairments with objective measurements of TUG and 5xSTS indicative of increased fall risk and balance deficits. Pt reports she is unable to hold and carry grandchildren, to walk on uneven surfaces, needs assistance with dressing, walking, transfers sometimes and wants to be more I. Pt would benefit from additional PT to further address deficits.    OBJECTIVE IMPAIRMENTS: Abnormal gait, decreased activity tolerance, decreased coordination, decreased endurance, decreased mobility, difficulty walking, decreased strength, increased fascial restrictions, increased muscle spasms, impaired flexibility, impaired sensation, improper body mechanics, postural dysfunction, and pain.   ACTIVITY LIMITATIONS: carrying, lifting, bending, sitting, standing, squatting, stairs,  transfers, bed mobility, locomotion level, and caring for others  PARTICIPATION LIMITATIONS: meal prep, cleaning, laundry, interpersonal relationship, driving, shopping, community activity, occupation, and yard work  PERSONAL FACTORS: Fitness, Past/current experiences, Time since onset of injury/illness/exacerbation, and 1 comorbidity: medical history are also affecting patient's functional outcome.   REHAB POTENTIAL: Good  CLINICAL DECISION MAKING: Evolving/moderate complexity  EVALUATION COMPLEXITY: Moderate   GOALS: Goals reviewed with patient? Yes  SHORT TERM GOALS: Target date: 02/13/24 Pt to be I with HEP for carry over and continuing recommendations for improved outcomes.   Baseline: Goal status: MET  2.  Pt to demonstrate at least 3/5 Rt LE strength grossly and 4/5 Lt for improved pelvic stability and functional squats without falling.  Baseline:  Goal status: IPARTIALLY MET 03/03/24  3.  Pt to demonstrate ability to complete static standing without AD for at least 5 mins at midline to complete gentle kitchen tasks/household tasks without fear of falling.  Baseline:  Goal status: MET  4.  Pt to demonstrate I with dynamic sitting balance to better be able to reach floor level for retrieving fallen objects and putting on socks without falling Baseline:  Goal status: MET 03/03/24  LONG TERM GOALS: Target date: 07/17/24  Pt to be I with advanced HEP for carry over and continuing recommendations for improved outcomes.   Baseline:  Goal status: INITIAL  2.  Pt to report improved LEFS score to at least 30/80 for improved I with functional tasks at home such as dressing, walking from room to room.  Baseline:  Goal status: IN PROGRESS 28/80 06/16/24  3.  Pt to demonstrate improved dynamic standing balance with or without AD at no more than CGA to better be able to play with grandchildren without falling.  Baseline:  Goal status: IN PROGRESS  4.  Pt to demonstrate improved gait  mechanics with LRAD for 150' with minimal compensate strategies to I with pt safety with ambulating into a store.  Baseline:  Goal status: IN PROGRESS  9/22 266 ft with SPC   5.  Pt to demonstrate ability to perform x5 Sit to stand with at least 10# to more safely assist in caring for grandchildren without risk of falling.  Baseline:  Goal status: IN PROGRESS    PLAN:  PT FREQUENCY: 2x/week  PT DURATION: 12 weeks  PLANNED INTERVENTIONS:    97110-Therapeutic exercises, 97530- Therapeutic activity, [id number]- Neuromuscular re-education, 97535- Self Care, [id number]- Manual therapy, [phone]- Gait training, [phone]- Canalith repositioning, [id number]- Aquatic Therapy, [phone]- Splinting, [id number]- Electrical stimulation (manual), [id number]- Vasopneumatic device, [id number]- Ultrasound, [id number]- Traction (mechanical), [id number]- Ionotophoresis 4mg /ml Dexamethasone , Patient/Family education, Balance training, Stair training, Taping, Dry Needling, Joint mobilization, Joint manipulation, Spinal manipulation, Spinal mobilization, Scar mobilization, DME instructions, Wheelchair mobility training, Cryotherapy, Moist heat, and Biofeedback  NEXT VISIT: for aquatic PT, core and LE strengthening and neuromuscular re-ed  for LAND: continue with resisted walking, focused standing glute med activities, hip ADDuctors and flexors   Delon Darner, PTA 06/18/24 8:53 AM

## 2024-06-19 NOTE — Telephone Encounter (Signed)
 Anna Ramsey, I think you found this in the portal yesterday if you can print a copy for us  to scan and then print a copy and mail to her that would be wonderful.

## 2024-06-20 NOTE — Telephone Encounter (Signed)
 Results mailed to  pt.

## 2024-06-23 ENCOUNTER — Encounter: Payer: Self-pay | Admitting: Physical Therapy

## 2024-06-23 ENCOUNTER — Ambulatory Visit: Admitting: Physical Therapy

## 2024-06-23 DIAGNOSIS — R269 Unspecified abnormalities of gait and mobility: Secondary | ICD-10-CM

## 2024-06-23 DIAGNOSIS — R293 Abnormal posture: Secondary | ICD-10-CM

## 2024-06-23 DIAGNOSIS — M6281 Muscle weakness (generalized): Secondary | ICD-10-CM

## 2024-06-23 DIAGNOSIS — M62838 Other muscle spasm: Secondary | ICD-10-CM

## 2024-06-23 NOTE — Therapy (Signed)
 OUTPATIENT PHYSICAL THERAPY FEMALE PELVIC AND ORTHO TREATMENT   Patient Name: Anna Ramsey MRN: [id number] DOB:[birth year], 58 y.o., female Today's Date: 06/23/2024  END OF SESSION:  PT End of Session - 06/23/24 0754     Visit Number 33    Date for Recertification  07/17/24    Authorization Type [employer]    PT Start Time 0755    PT Stop Time 0844    PT Time Calculation (min) 49 min    Activity Tolerance Patient tolerated treatment well    Behavior During Therapy [place] for tasks assessed/performed                  Past Medical History:  Diagnosis Date   ADHD (attention deficit hyperactivity disorder) 2024   Dysuria    Erythematous bladder mucosa    Heart murmur    asymptomatic per pt --  no echo   Hematuria    History of cervical cancer    12/ 2014  Stage IB2--  s/p  TAH w/ BSO and pelvic lymphadectomy/  and radiation therapy   History of radiation therapy 10/20/13-11/28/13   pelvis 50.4 gray   Hypertension    Lesion of bladder    Mild intermittent asthma    Neuromuscular disorder (HCC)    Seasonal allergies    Thyroid disease    Past Surgical History:  Procedure Laterality Date   CYSTOSCOPY WITH BIOPSY N/A 04/29/2015   Procedure: CYSTOSCOPY WITH BIOPSY WITH FULGERATION;  Surgeon: Belvie LITTIE Clara, MD;  Location[facility];  Service: Urology;  Laterality: N/A;  1 HR [phone] [id number]   FOOT SURGERY Left 2010   RADICAL ABDOMINAL HYSTERECTOMY  09-05-2013   w/ BILATERAL SALPINGOOPHORECTOMY AND PELVIC LYMPHADECTOMY   Patient Active Problem List   Diagnosis Date Noted   ADD (attention deficit disorder) 04/21/2024   SUI (stress urinary incontinence, female) 11/01/2023   History of urinary retention 11/01/2023   Neurogenic bladder 10/18/2023   Hematuria 10/18/2023   Acute urinary retention 10/18/2023   Stress and adjustment reaction 01/13/2021   Inattention 10/28/2019   Hypothyroid 11/13/2016   Neuropathy involving both lower  extremities 11/13/2016   Cystitis, radiation 05/22/2014   Cervical cancer (HCC) 09/01/2013   Adenocarcinoma of cervix, 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   Asthma, mild intermittent 07/07/2009   INSOMNIA 05/28/2008   Generalized anxiety disorder 05/07/2008   HYPERTENSION, BENIGN 05/31/2007    PCP: Geraline Dorothyann JONETTA, MD  REFERRING PROVIDER: Skeet Juliene SAUNDERS, DO   REFERRING DIAG:  [phone] (ICD-10-CM) - Bilateral leg weakness G62.89 (ICD-10-CM) - 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 I bet I walked 2 blocks. I have had a couple of near falls this past week when my feet got tangled up in cords because I can't feel them. I was able to catch myself because I have muscles. I did fall Thursday morning because my knee just gave out after I went to get the mail.  Eval: Right leg worse than left but both painful. Right has instances of knee just giving out, has neuropathy - like weakness, pain since radiation in 2015. The nerves have been affected and greatly limiting her activity. Does have symptoms of numbness, sensory deficits, weakness, ataxic gait, and painful but all vary based on the day.     Fluid intake: Yes: water    PAIN:  Are you having pain? No NPRS scale:0 Pain location: bil legs Right leg  Pain type: aching Pain description: constant   Aggravating factors: walking, end of day, rainy days Relieving factors:  try not to walk, decreasing activities    PRECAUTIONS: Other: cervical cancer with radiation  RED FLAGS: None   WEIGHT BEARING RESTRICTIONS: No  FALLS:  Has patient fallen in last 6 months? Yes. Number of falls 1x/weekly due to weakness and decreased balance from having the radiation, trouble with low light - reports she does need to furniture walk to help with balance  LIVING ENVIRONMENT: Lives with: lives with their family  OCCUPATION: sits with laptop on her lap for 12 hours per day; unable to sit with legs down due to them falling asleep  PLOF: Independent  PATIENT GOALS: to strengthening enough to walk across grass, wants to have better balance, stronger legs                                        PERTINENT HISTORY:  ADHD; Cervical Cancer 2014; 9 weeks of radiation; Radical hysterectomy;    OBJECTIVE:  Note: Objective measures were completed at Evaluation unless otherwise noted.  DIAGNOSTIC FINDINGS:   PATIENT SURVEYS:  Lower Extremity Functional Score: 5 / 80 = 6.3 % 06/16/24 LEFS 28 / 80 = 35.0 %  COGNITION: Overall cognitive status: Within functional limits for tasks assessed     SENSATION: Poor sensation at Rt LE - light touch felt along posterior gastroc only no awareness without visualization of being touched at medial, lateral, anterior lower leg, reports numbness in bil feet Rt>Lt  EDEMA:  Rt ankle sometimes swells per pt  MUSCLE LENGTH: Bil hamstrings and adductors limited by 25%;  POSTURE: rounded shoulders and forward head  LOWER EXTREMITY ROM:  weakness and poor coordination/proprioception   LOWER EXTREMITY MMT:  02/04/24 hip flexion was R 3/5, L 4/5  MMT Right Eval */5 Left Eval */5 Right 5/16 Left 5/16 RIGHT 6/6 LEFT  6/6  Hip flexion 2 3 4+ 5 3   Hip extension 2 3 4+ 4 4+ 4  Hip abduction 3 3+ 5- 5 5   Hip adduction 1+ 2+ 2+ 2+ 2+ 2+  Hip internal rotation        Hip external rotation        Knee flexion 2 3 3- 4 3+ 5  Knee  extension 2+ 3 3- 5 3+   Ankle dorsiflexion 0 4 0 4+ long sitting 2 4  4+ long sitting 2    Ankle plantarflexion 3 4  Ankle inversion 1 4 3+ tested in long sitting 4+ tested in long sitting 3+ in long sitting 5 in long sitting  Ankle eversion 2 4 3+ tested in long sitting 4- tested in long sitting 2+ 4- tested in long sitting   (Blank rows = not tested)   FUNCTIONAL TESTS:  Five times Sit to Stand Test (FTSS) Method: Use a straight back chair with a solid seat that is 16-18" high. Ask participant to sit on the chair with arms folded across their chest.   Instructions: "Stand up and sit down as quickly as possible 5 times, keeping your arms folded across your chest."   Measurement: Stop timing when the participant stands the 5th time.  TIME: ______ (in seconds)  Times > 13.6 seconds is associated with increased disability and morbidity (Guralnik, 2000) Times > 15 seconds is predictive of recurrent falls in healthy individuals aged 42 and older (Buatois, et al., 2008) Normal performance values in community dwelling individuals aged 1 and older (Bohannon, 2006): 50-59 years: 8 seconds 60-69 years: 11.4 seconds 70-79 years: 12.6 seconds 80-89 years: 14.8 seconds  MCID: >= 2.3 seconds for Vestibular Disorders (Meretta, 2006)  5 times sit to stand: 22s with hands - very unstable with poor hip, posture, core control  Timed up and go (TUG): 28s with SBQC - min A  GAIT: Distance walked: 200' Assistive device utilized: Quad cane small base Level of assistance: CGA Comments: Rt LE demonstrating ataxic pattern, Lt trunk lean intermittently  to right self, poor step height bil, decreased stride length, limited glute activation    06/16/24 266 ft with SPC, improved step height and step length, intermittent LOB if pt turns to speak to therapist, but pt able to right herself independently. Minimal trunk lean.                                                                                                                                TREATMENT DATE:  06/23/24 NuStep seat 4 arms 9 L3 x 8' (PT present to discuss status Semi-reclined to wedge: yellow loop around thighs, ball squeeze 10x5, bridge with ball squeeze 10x5, Rt LE quad and glut set imprint press into mat table 10x5, red ball push down for core activation 10x5, red ball roll ups for core x10 Supine clams yellow loop 5 sec  hold x 10 Sidelying: side planks (elbow and knees) 5 x 5 sec hold right/left Modified plank on elbows max hold  - a few reps Sidelying: clam red band 5 sec hold 10x right/left Sidelying hip ABD 10X B Hooklying active hip ADDuction x 10   06/18/24:Pt arrives for aquatic physical therapy. Treatment took place in 3.5-5.5 feet of water . Water  temperature was 91 degrees f. Pt entered the pool via steps independently with use of bil rails. Pt requires buoyancy of water  for support and to offload joints with strengthening exercises.  Pt utilizes viscosity of the water  required for strengthening.  Seated water  bench with 75% submersion Pt performed seated LE AROM exercises 20x in all planes, concurrent review of status with pt.  -Began with seated decompression to give LE moment to acclimate to water  and exercise -60% water  depth water  walking with Qtip 8x each direction with Vc for control of LE and focus on heel -toe walking.as best she can.Added ankle fin on LT LE and 2.5# ankle weight on RTLE for increased proprioception and hip strenght -Standing core with pink water  bells 3x20 -Hip 3 ways with ankle fins 2x20 -Rainbow floats pushing down by herside/hold and walk tall 4 lengths - Seated decompression with yellow noodle for LE and pelvic floor relaxation  06/16/24 LEFS see above Gait x 266 ft with SPC  NuStep seat 4 arms 9 L3 x 8' (PT present to discuss how trip went with getting down to beach and back) Semi-reclined to wedge: ball squeeze 10x5, bridge with ball squeeze 10x5, Rt LE quad and glut  set imprint press into mat table 10x5, red ball push down for core activation 10x5, red ball roll ups for core x10 Sidelying: side planks (elbow and knees)5x 5 sec hold right/left Modified plank on elbows max hold  - a few reps Sidelying: hip abduction 10x right/left Seated sit ups with OH press 5lb x10, then slow seated 5lb russian twist x12, then fast russian twist x12 - Pt provided knee stabilization for core focus Sit to stand min A x5   06/11/24:Pt arrives for aquatic physical therapy. Treatment took place in 3.5-5.5 feet of water . Water  temperature was 91 degrees f. Pt entered the pool via steps independently with use of bil rails. Pt requires buoyancy of water  for support and to offload joints with strengthening exercises.  Pt utilizes viscosity of the water  required for strengthening. Seated water  bench with 75% submersion Pt performed seated LE AROM exercises 20x in all planes, concurrent review of status with pt.  -60% water  depth water  walking with Qtip 8x each direction with Vc for control of LE and focus on heel -toe walking.as best she can. -Standing core with pink water  bells 3x10 -Hip 3 ways with ankle fins 2x20 -Rainbow floats pushing down by herside/hold and walk tall 4 lengths - Seated decompression with yellow noodle for LE and pelvic floor relaxation  PATIENT EDUCATION:  Education details: [email or web address] - uses App Person educated: Patient Education method: Explanation, Demonstration, Tactile cues, Verbal cues, and Handouts Education comprehension: verbalized understanding, returned demonstration, verbal cues required, tactile cues required, and needs further education  HOME EXERCISE PROGRAM: Access Code: [email or web address] URL: [email or web address] Date: 04/07/2024 Prepared by: Mliss  Exercises - Supine Heel Slide with Strap  - 1 x daily - 7 x weekly - 3 sets - 10 reps - Pillow squeezes with kegel  - 1 x daily - 7 x weekly - 3 sets - 10 reps - Sit to Stand with  Counter Support  - 1 x daily - 7 x weekly - 1 sets - 10 reps - Seated Long Arc Quad  - 1 x daily - 7 x weekly - 1-3 sets - 10 reps - 5 sec hold - Seated Isometric Hip Abduction with Resistance  - 1 x daily - 7 x weekly - 1-3 sets - 10 reps - Heel Raises with Counter Support  - 1 x daily - 7 x weekly - 1-3 sets - 10 reps - Standing Terminal Knee Extension at Wall with Ball  - 2 x daily - 7 x weekly - 2 sets -  10 reps - 5 sec hold - Squat with Chair Touch and Resistance Loop  - 1 x daily - 3 x weekly - 2 sets - 10 reps - Standing Knee Flexion with Counter Support  - 1 x daily - 3 x weekly - 2 sets - 10 reps - Supine Bridge with Resistance Band  - 1 x daily - 7 x weekly - 1-2 sets - 10 reps - 5 sec hold - Standing Hip Abduction on Slider  - 1 x daily - 3 x weekly - 2 sets - 10 reps - Calf stretch on rocker board  - 1 x daily - 7 x weekly - 1 sets - 3 reps - 30 sec  hold - Standing Anti-Rotation Press with Anchored Resistance  - 1 x daily - 3 x weekly - 2 sets - 10 reps - Standing Trunk Rotation with Resistance  - 1 x daily - 3 x weekly - 2 sets - 10 reps  ASSESSMENT:  CLINICAL IMPRESSION: Patient continuing to progress with functional strengthening. She reports she was able to prevent a couple of falls this week due to increased core and leg strength. She also tested her walking endurance at a concert and was able to walk about 2 blocks with her SPC. She continues to have significant ADDuctor weakness and is unable to actively adduct her legs in long sitting. She has a strong contraction on the left and more than a trace on the R. She was challenged with hooklying Adduction and this was added to HEP. She is also showing improved core and hip stability with sit to stand. She continues to demonstrate potential for improvement and would benefit from continued skilled therapy to address impairments.     Eval: Patient is a 58 y.o. female  who was seen today for physical therapy evaluation and tr in the  teatment for poor gait mechanics, impaired posture, impaired balance, decreased hip and core strength, increased fall risk. Pt has complex history of  Radiation-induced lumbosacral plexopathy and Polyradiculoneuropathy status post cervical cancer. Pt has completed pelvic floor PT recently and had positive outcomes with this and resolution of symptoms. Pt very motivated to participate in PT for bil LE strengthening and decreased fall risk. Pt demonstrated impairments with objective measurements of TUG and 5xSTS indicative of increased fall risk and balance deficits. Pt reports she is unable to hold and carry grandchildren, to walk on uneven surfaces, needs assistance with dressing, walking, transfers sometimes and wants to be more I. Pt would benefit from additional PT to further address deficits.    OBJECTIVE IMPAIRMENTS: Abnormal gait, decreased activity tolerance, decreased coordination, decreased endurance, decreased mobility, difficulty walking, decreased strength, increased fascial restrictions, increased muscle spasms, impaired flexibility, impaired sensation, improper body mechanics, postural dysfunction, and pain.   ACTIVITY LIMITATIONS: carrying, lifting, bending, sitting, standing, squatting, stairs, transfers, bed mobility, locomotion level, and caring for others  PARTICIPATION LIMITATIONS: meal prep, cleaning, laundry, interpersonal relationship, driving, shopping, community activity, occupation, and yard work  PERSONAL FACTORS: Fitness, Past/current experiences, Time since onset of injury/illness/exacerbation, and 1 comorbidity: medical history are also affecting patient's functional outcome.   REHAB POTENTIAL: Good  CLINICAL DECISION MAKING: Evolving/moderate complexity  EVALUATION COMPLEXITY: Moderate   GOALS: Goals reviewed with patient? Yes  SHORT TERM GOALS: Target date: 02/13/24 Pt to be I with HEP for carry over and continuing recommendations for improved outcomes.    Baseline: Goal status: MET  2.  Pt to demonstrate at least 3/5 Rt LE strength grossly  and 4/5 Lt for improved pelvic stability and functional squats without falling.  Baseline:  Goal status: IPARTIALLY MET 03/03/24  3.  Pt to demonstrate ability to complete static standing without AD for at least 5 mins at midline to complete gentle kitchen tasks/household tasks without fear of falling.  Baseline:  Goal status: MET  4.  Pt to demonstrate I with dynamic sitting balance to better be able to reach floor level for retrieving fallen objects and putting on socks without falling Baseline:  Goal status: MET 03/03/24  LONG TERM GOALS: Target date: 07/17/24  Pt to be I with advanced HEP for carry over and continuing recommendations for improved outcomes.   Baseline:  Goal status: INITIAL  2.  Pt to report improved LEFS score to at least 30/80 for improved I with functional tasks at home such as dressing, walking from room to room.  Baseline:  Goal status: IN PROGRESS 28/80 06/16/24  3.  Pt to demonstrate improved dynamic standing balance with or without AD at no more than CGA to better be able to play with grandchildren without falling.  Baseline:  Goal status: IN PROGRESS  4.  Pt to demonstrate improved gait mechanics with LRAD for 150' with minimal compensate strategies to I with pt safety with ambulating into a store.  Baseline:  Goal status: IN PROGRESS  9/22 266 ft with SPC   5.  Pt to demonstrate ability to perform x5 Sit to stand with at least 10# to more safely assist in caring for grandchildren without risk of falling.  Baseline:  Goal status: IN PROGRESS    PLAN:  PT FREQUENCY: 2x/week  PT DURATION: 12 weeks  PLANNED INTERVENTIONS:    97110-Therapeutic exercises, 97530- Therapeutic activity, [id number]- Neuromuscular re-education, 97535- Self Care, [id number]- Manual therapy, [phone]- Gait training, [phone]- Canalith repositioning, [id number]- Aquatic Therapy, [phone]- Splinting, [id number]-  Electrical stimulation (manual), [id number]- Vasopneumatic device, [id number]- Ultrasound, [id number]- Traction (mechanical), [id number]- Ionotophoresis 4mg /ml Dexamethasone , Patient/Family education, Balance training, Stair training, Taping, Dry Needling, Joint mobilization, Joint manipulation, Spinal manipulation, Spinal mobilization, Scar mobilization, DME instructions, Wheelchair mobility training, Cryotherapy, Moist heat, and Biofeedback  NEXT VISIT: for aquatic PT, Work on ADDuction, core and LE strengthening and neuromuscular re-ed  for LAND: continue with resisted walking, focused standing glute med activities, hip ADDuctors and flexors   Mliss Cummins, PT  06/23/24 9:12 AM

## 2024-06-25 ENCOUNTER — Encounter: Payer: Self-pay | Admitting: Physical Therapy

## 2024-06-25 ENCOUNTER — Ambulatory Visit: Attending: Neurology | Admitting: Physical Therapy

## 2024-06-25 DIAGNOSIS — R102 Pelvic and perineal pain unspecified side: Secondary | ICD-10-CM | POA: Diagnosis present

## 2024-06-25 DIAGNOSIS — M62838 Other muscle spasm: Secondary | ICD-10-CM | POA: Diagnosis present

## 2024-06-25 DIAGNOSIS — M6281 Muscle weakness (generalized): Secondary | ICD-10-CM | POA: Diagnosis present

## 2024-06-25 DIAGNOSIS — R269 Unspecified abnormalities of gait and mobility: Secondary | ICD-10-CM | POA: Diagnosis present

## 2024-06-25 DIAGNOSIS — R279 Unspecified lack of coordination: Secondary | ICD-10-CM | POA: Diagnosis present

## 2024-06-25 DIAGNOSIS — R293 Abnormal posture: Secondary | ICD-10-CM | POA: Diagnosis present

## 2024-06-25 NOTE — Therapy (Signed)
 OUTPATIENT PHYSICAL THERAPY FEMALE PELVIC AND ORTHO TREATMENT   Patient Name: Anna Ramsey MRN: [id number] DOB:[birth year], 58 y.o., female Today's Date: 06/25/2024  END OF SESSION:  PT End of Session - 06/25/24 0805     Visit Number 34    Date for Recertification  07/17/24    Authorization Type [employer]    PT Start Time 0805    Activity Tolerance Patient tolerated treatment well    Behavior During Therapy [facility] for tasks assessed/performed                  Past Medical History:  Diagnosis Date   ADHD (attention deficit hyperactivity disorder) 2024   Dysuria    Erythematous bladder mucosa    Heart murmur    asymptomatic per pt --  no echo   Hematuria    History of cervical cancer    12/ 2014  Stage IB2--  s/p  TAH w/ BSO and pelvic lymphadectomy/  and radiation therapy   History of radiation therapy 10/20/13-11/28/13   pelvis 50.4 gray   Hypertension    Lesion of bladder    Mild intermittent asthma    Neuromuscular disorder (HCC)    Seasonal allergies    Thyroid disease    Past Surgical History:  Procedure Laterality Date   CYSTOSCOPY WITH BIOPSY N/A 04/29/2015   Procedure: CYSTOSCOPY WITH BIOPSY WITH FULGERATION;  Surgeon: Belvie LITTIE Clara, MD;  Location: [facility];  Service: Urology;  Laterality: N/A;  1 HR [phone] [id number]   FOOT SURGERY Left 2010   RADICAL ABDOMINAL HYSTERECTOMY  09-05-2013   w/ BILATERAL SALPINGOOPHORECTOMY AND PELVIC LYMPHADECTOMY   Patient Active Problem List   Diagnosis Date Noted   ADD (attention deficit disorder) 04/21/2024   SUI (stress urinary incontinence, female) 11/01/2023   History of urinary retention 11/01/2023   Neurogenic bladder 10/18/2023   Hematuria 10/18/2023   Acute urinary retention 10/18/2023   Stress and adjustment reaction 01/13/2021   Inattention 10/28/2019   Hypothyroid 11/13/2016   Neuropathy involving both lower extremities 11/13/2016   Cystitis, radiation 05/22/2014    Cervical cancer (HCC) 09/01/2013   Adenocarcinoma of cervix, 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   Asthma, mild intermittent 07/07/2009   INSOMNIA 05/28/2008   Generalized anxiety disorder 05/07/2008   HYPERTENSION, BENIGN 05/31/2007    PCP: Geraline Dorothyann JONETTA, MD  REFERRING PROVIDER: Skeet Juliene SAUNDERS, DO   REFERRING DIAG:  [phone] (ICD-10-CM) - Bilateral leg weakness G62.89 (ICD-10-CM) - 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    Eval: Right leg worse than left but both painful. Right has instances of knee just giving out, has neuropathy - like weakness, pain since radiation in 2015. The nerves have been affected and greatly limiting her activity. Does have symptoms of numbness, sensory deficits, weakness, ataxic gait, and painful but all vary based on the day.     Fluid intake: Yes: water    PAIN:  Are you having pain? No NPRS scale:0 Pain location: bil legs Right leg  Pain type: aching Pain description: constant   Aggravating factors: walking, end of day, rainy days Relieving factors: try not to walk, decreasing activities    PRECAUTIONS: Other: cervical cancer with radiation  RED FLAGS: None   WEIGHT BEARING RESTRICTIONS: No  FALLS:  Has patient fallen in last 6 months? Yes. Number of falls 1x/weekly due to weakness and decreased balance from having the radiation, trouble with low light - reports she does need  to furniture walk to help with balance  LIVING ENVIRONMENT: Lives with: lives with their family  OCCUPATION: sits with laptop on her lap for 12 hours per day; unable to sit with legs down due to them falling asleep  PLOF: Independent  PATIENT GOALS: to strengthening enough to walk across grass, wants to have better balance, stronger legs                                        PERTINENT HISTORY:  ADHD; Cervical Cancer 2014; 9 weeks of radiation; Radical hysterectomy;    OBJECTIVE:  Note: Objective measures were completed at Evaluation unless otherwise noted.  DIAGNOSTIC FINDINGS:   PATIENT SURVEYS:  Lower Extremity Functional Score: 5 / 80 = 6.3 % 06/16/24 LEFS 28 / 80 = 35.0 %  COGNITION: Overall cognitive status: Within functional limits for tasks assessed     SENSATION: Poor sensation at Rt LE - light touch felt along posterior gastroc only no awareness without visualization of being touched at medial, lateral, anterior lower leg, reports numbness in bil feet Rt>Lt  EDEMA:  Rt ankle sometimes swells per pt  MUSCLE LENGTH: Bil hamstrings and adductors limited by 25%;  POSTURE: rounded shoulders and forward head  LOWER EXTREMITY ROM:  weakness and poor coordination/proprioception   LOWER EXTREMITY MMT:  02/04/24 hip flexion was R 3/5, L 4/5  MMT Right Eval */5 Left Eval */5 Right 5/16 Left 5/16 RIGHT 6/6 LEFT  6/6  Hip flexion 2 3 4+ 5 3   Hip extension 2 3 4+ 4 4+ 4  Hip abduction 3 3+ 5- 5 5   Hip adduction 1+ 2+ 2+ 2+ 2+ 2+  Hip internal rotation        Hip external rotation        Knee flexion 2 3 3- 4 3+ 5  Knee extension 2+ 3 3- 5 3+   Ankle dorsiflexion 0 4 0 4+ long sitting 2 4  4+ long sitting 2    Ankle plantarflexion 3 4      Ankle inversion 1 4 3+ tested in long sitting 4+ tested in long sitting 3+ in long sitting 5 in long sitting  Ankle eversion 2 4 3+ tested in long sitting 4- tested in long sitting 2+ 4- tested in long sitting   (Blank  rows = not tested)   FUNCTIONAL TESTS:  Five times Sit to Stand Test (  FTSS) Method: Use a straight back chair with a solid seat that is 16-18" high. Ask participant to sit on the chair with arms folded across their chest.   Instructions: "Stand up and sit down as quickly as possible 5 times, keeping your arms folded across your chest."   Measurement: Stop timing when the participant stands the 5th time.  TIME: ______ (in seconds)  Times > 13.6 seconds is associated with increased disability and morbidity (Guralnik, 2000) Times > 15 seconds is predictive of recurrent falls in healthy individuals aged 4 and older (Buatois, et al., 2008) Normal performance values in community dwelling individuals aged 10 and older (Bohannon, 2006): 50-59 years: 8 seconds 60-69 years: 11.4 seconds 70-79 years: 12.6 seconds 80-89 years: 14.8 seconds  MCID: >= 2.3 seconds for Vestibular Disorders (Meretta, 2006)  5 times sit to stand: 22s with hands - very unstable with poor hip, posture, core control  Timed up and go (TUG): 28s with SBQC - min A  GAIT: Distance walked: 200' Assistive device utilized: Quad cane small base Level of assistance: CGA Comments: Rt LE demonstrating ataxic pattern, Lt trunk lean intermittently  to right self, poor step height bil, decreased stride length, limited glute activation    06/16/24 266 ft with SPC, improved step height and step length, intermittent LOB if pt turns to speak to therapist, but pt able to right herself independently. Minimal trunk lean.                                                                                                                               TREATMENT DATE:  06/25/24:Pt arrives for aquatic physical therapy. Treatment took place in 3.5-5.5 feet of water . Water  temperature was 91 degrees f. Pt entered the pool via steps independently with use of bil rails. Pt requires buoyancy of water  for support and to offload joints with strengthening  exercises.  Pt utilizes viscosity of the water  required for strengthening. Seated water  bench with 75% submersion Pt performed seated LE AROM exercises 20x in all planes, concurrent review of status with pt.  -Began with seated decompression to give LE moment to acclimate to water  and exercise. Pink ankle fin on LTLE and blue wt on RT. -60% water  depth water  walking with Qtip 8x each direction with Vc for control of LE and focus on heel -toe walking.as best she can.Added ankle fin on LT LE and 2.5# ankle weight on RTLE for increased proprioception and hip strenght -Standing core with pink water  bells 3x20 -Hip 3 ways with ankle fins 2x20 -Rainbow floats pushing down by herside/hold and walk tall 4 lengths -Plank with yellow hand weights, toes on floor  20-30 sec 3x -Mini squat 3x5: holding really anything to help stabilize (floats, bells).  - Seated decompression with yellow noodle for LE and pelvic floor relaxation  06/23/24 NuStep seat 4 arms 9 L3 x 8' (PT present to discuss status Semi-reclined to wedge: yellow loop around thighs,  ball squeeze 10x5, bridge with ball squeeze 10x5, Rt LE quad and glut set imprint press into mat table 10x5, red ball push down for core activation 10x5, red ball roll ups for core x10 Supine clams yellow loop 5 sec  hold x 10 Sidelying: side planks (elbow and knees) 5 x 5 sec hold right/left Modified plank on elbows max hold  - a few reps Sidelying: clam red band 5 sec hold 10x right/left Sidelying hip ABD 10X B Hooklying active hip ADDuction x 10   06/18/24:Pt arrives for aquatic physical therapy. Treatment took place in 3.5-5.5 feet of water . Water  temperature was 91 degrees f. Pt entered the pool via steps independently with use of bil rails. Pt requires buoyancy of water  for support and to offload joints with strengthening exercises.  Pt utilizes viscosity of the water  required for strengthening. Seated water  bench with 75% submersion Pt performed seated  LE AROM exercises 20x in all planes, concurrent review of status with pt.  -Began with seated decompression to give LE moment to acclimate to water  and exercise -60% water  depth water  walking with Qtip 8x each direction with Vc for control of LE and focus on heel -toe walking.as best she can.Added ankle fin on LT LE and 2.5# ankle weight on RTLE for increased proprioception and hip strenght -Standing core with pink water  bells 3x20 -Hip 3 ways with ankle fins 2x20 -Rainbow floats pushing down by herside/hold and walk tall 4 lengths - Seated decompression with yellow noodle for LE and pelvic floor relaxation  PATIENT EDUCATION:  Education details: [email or web address] - uses App Person educated: Patient Education method: Explanation, Demonstration, Tactile cues, Verbal cues, and Handouts Education comprehension: verbalized understanding, returned demonstration, verbal cues required, tactile cues required, and needs further education  HOME EXERCISE PROGRAM: Access Code: [email or web address] URL: [email or web address] Date: 04/07/2024 Prepared by: Mliss  Exercises - Supine Heel Slide with Strap  - 1 x daily - 7 x weekly - 3 sets - 10 reps - Pillow squeezes with kegel  - 1 x daily - 7 x weekly - 3 sets - 10 reps - Sit to Stand with Counter Support  - 1 x daily - 7 x weekly - 1 sets - 10 reps - Seated Long Arc Quad  - 1 x daily - 7 x weekly - 1-3 sets - 10 reps - 5 sec hold - Seated Isometric Hip Abduction with Resistance  - 1 x daily - 7 x weekly - 1-3 sets - 10 reps - Heel Raises with Counter Support  - 1 x daily - 7 x weekly - 1-3 sets - 10 reps - Standing Terminal Knee Extension at Wall with Ball  - 2 x daily - 7 x weekly - 2 sets - 10 reps - 5 sec hold - Squat with Chair Touch and Resistance Loop  - 1 x daily - 3 x weekly - 2 sets - 10 reps - Standing Knee Flexion with Counter Support  - 1 x daily - 3 x weekly - 2 sets - 10 reps - Supine Bridge with Resistance Band  - 1 x daily - 7 x weekly - 1-2  sets - 10 reps - 5 sec hold - Standing Hip Abduction on Slider  - 1 x daily - 3 x weekly - 2 sets - 10 reps - Calf stretch on rocker board  - 1 x daily - 7 x weekly - 1 sets - 3 reps - 30 sec  hold - Standing Anti-Rotation Press with Anchored  Resistance  - 1 x daily - 3 x weekly - 2 sets - 10 reps - Standing Trunk Rotation with Resistance  - 1 x daily - 3 x weekly - 2 sets - 10 reps  ASSESSMENT:  CLINICAL IMPRESSION: Today pt reports she was able to lift her foot, via ankle and not hip or hamstring actio, off her gas pedal for the first time.1x. Added plank in water  today to continue her core work which she did very well. Pt also able to perform mini squats with some assistance from hand held equipment and demonstrate control throughout  the entire LE Bil.   Eval: Patient is a 58 y.o. female  who was seen today for physical therapy evaluation and tr in the teatment for poor gait mechanics, impaired posture, impaired balance, decreased hip and core strength, increased fall risk. Pt has complex history of  Radiation-induced lumbosacral plexopathy and Polyradiculoneuropathy status post cervical cancer. Pt has completed pelvic floor PT recently and had positive outcomes with this and resolution of symptoms. Pt very motivated to participate in PT for bil LE strengthening and decreased fall risk. Pt demonstrated impairments with objective measurements of TUG and 5xSTS indicative of increased fall risk and balance deficits. Pt reports she is unable to hold and carry grandchildren, to walk on uneven surfaces, needs assistance with dressing, walking, transfers sometimes and wants to be more I. Pt would benefit from additional PT to further address deficits.    OBJECTIVE IMPAIRMENTS: Abnormal gait, decreased activity tolerance, decreased coordination, decreased endurance, decreased mobility, difficulty walking, decreased strength, increased fascial restrictions, increased muscle spasms, impaired flexibility,  impaired sensation, improper body mechanics, postural dysfunction, and pain.   ACTIVITY LIMITATIONS: carrying, lifting, bending, sitting, standing, squatting, stairs, transfers, bed mobility, locomotion level, and caring for others  PARTICIPATION LIMITATIONS: meal prep, cleaning, laundry, interpersonal relationship, driving, shopping, community activity, occupation, and yard work  PERSONAL FACTORS: Fitness, Past/current experiences, Time since onset of injury/illness/exacerbation, and 1 comorbidity: medical history are also affecting patient's functional outcome.   REHAB POTENTIAL: Good  CLINICAL DECISION MAKING: Evolving/moderate complexity  EVALUATION COMPLEXITY: Moderate   GOALS: Goals reviewed with patient? Yes  SHORT TERM GOALS: Target date: 02/13/24 Pt to be I with HEP for carry over and continuing recommendations for improved outcomes.   Baseline: Goal status: MET  2.  Pt to demonstrate at least 3/5 Rt LE strength grossly and 4/5 Lt for improved pelvic stability and functional squats without falling.  Baseline:  Goal status: IPARTIALLY MET 03/03/24  3.  Pt to demonstrate ability to complete static standing without AD for at least 5 mins at midline to complete gentle kitchen tasks/household tasks without fear of falling.  Baseline:  Goal status: MET  4.  Pt to demonstrate I with dynamic sitting balance to better be able to reach floor level for retrieving fallen objects and putting on socks without falling Baseline:  Goal status: MET 03/03/24  LONG TERM GOALS: Target date: 07/17/24  Pt to be I with advanced HEP for carry over and continuing recommendations for improved outcomes.   Baseline:  Goal status: INITIAL  2.  Pt to report improved LEFS score to at least 30/80 for improved I with functional tasks at home such as dressing, walking from room to room.  Baseline:  Goal status: IN PROGRESS 28/80 06/16/24  3.  Pt to demonstrate improved dynamic standing balance with or  without AD at no more than CGA to better be able to play with grandchildren without falling.  Baseline:  Goal status: IN PROGRESS  4.  Pt to demonstrate improved gait mechanics with LRAD for 150' with minimal compensate strategies to I with pt safety with ambulating into a store.  Baseline:  Goal status: IN PROGRESS  9/22 266 ft with SPC   5.  Pt to demonstrate ability to perform x5 Sit to stand with at least 10# to more safely assist in caring for grandchildren without risk of falling.  Baseline:  Goal status: IN PROGRESS    PLAN:  PT FREQUENCY: 2x/week  PT DURATION: 12 weeks  PLANNED INTERVENTIONS:    97110-Therapeutic exercises, 97530- Therapeutic activity, [id number]- Neuromuscular re-education, 97535- Self Care, [id number]- Manual therapy, [phone]- Gait training, [phone]- Canalith repositioning, [id number]- Aquatic Therapy, [phone]- Splinting, [id number]- Electrical stimulation (manual), [id number]- Vasopneumatic device, [id number]- Ultrasound, [id number]- Traction (mechanical), [id number]- Ionotophoresis 4mg /ml Dexamethasone , Patient/Family education, Balance training, Stair training, Taping, Dry Needling, Joint mobilization, Joint manipulation, Spinal manipulation, Spinal mobilization, Scar mobilization, DME instructions, Wheelchair mobility training, Cryotherapy, Moist heat, and Biofeedback  NEXT VISIT: for aquatic PT, Work on ADDuction, core and LE strengthening and neuromuscular re-ed  for LAND: continue with resisted walking, focused standing glute med activities, hip ADDuctors and flexors   Delon Darner, PTA 06/25/24 8:06 AM

## 2024-07-02 ENCOUNTER — Encounter: Payer: Self-pay | Admitting: Physical Therapy

## 2024-07-02 ENCOUNTER — Ambulatory Visit: Admitting: Physical Therapy

## 2024-07-02 DIAGNOSIS — M6281 Muscle weakness (generalized): Secondary | ICD-10-CM | POA: Diagnosis not present

## 2024-07-02 DIAGNOSIS — R279 Unspecified lack of coordination: Secondary | ICD-10-CM

## 2024-07-02 DIAGNOSIS — M62838 Other muscle spasm: Secondary | ICD-10-CM

## 2024-07-02 DIAGNOSIS — R293 Abnormal posture: Secondary | ICD-10-CM

## 2024-07-02 DIAGNOSIS — R269 Unspecified abnormalities of gait and mobility: Secondary | ICD-10-CM

## 2024-07-02 DIAGNOSIS — R102 Pelvic and perineal pain unspecified side: Secondary | ICD-10-CM

## 2024-07-02 NOTE — Therapy (Signed)
 OUTPATIENT PHYSICAL THERAPY FEMALE PELVIC AND ORTHO TREATMENT   Patient Name: Anna Ramsey MRN: [id number] DOB:07-08-1966, 58 y.o., female Today's Date: 07/02/2024  END OF SESSION:  PT End of Session - 07/02/24 0855     Visit Number 35    Date for Recertification  07/17/24    Authorization Type [employer]    PT Start Time 0850    PT Stop Time 0943    PT Time Calculation (min) 53 min    Activity Tolerance Patient tolerated treatment well    Behavior During Therapy [facility] for tasks assessed/performed                  Past Medical History:  Diagnosis Date   ADHD (attention deficit hyperactivity disorder) 2024   Dysuria    Erythematous bladder mucosa    Heart murmur    asymptomatic per pt --  no echo   Hematuria    History of cervical cancer    12/ 2014  Stage IB2--  s/p  TAH w/ BSO and pelvic lymphadectomy/  and radiation therapy   History of radiation therapy 10/20/13-11/28/13   pelvis 50.4 gray   Hypertension    Lesion of bladder    Mild intermittent asthma    Neuromuscular disorder (HCC)    Seasonal allergies    Thyroid disease    Past Surgical History:  Procedure Laterality Date   CYSTOSCOPY WITH BIOPSY N/A 04/29/2015   Procedure: CYSTOSCOPY WITH BIOPSY WITH FULGERATION;  Surgeon: Belvie LITTIE Clara, MD;  Location: [facility];  Service: Urology;  Laterality: N/A;  1 HR [phone] [id number]   FOOT SURGERY Left 2010   RADICAL ABDOMINAL HYSTERECTOMY  09-05-2013   w/ BILATERAL SALPINGOOPHORECTOMY AND PELVIC LYMPHADECTOMY   Patient Active Problem List   Diagnosis Date Noted   ADD (attention deficit disorder) 04/21/2024   SUI (stress urinary incontinence, female) 11/01/2023   History of urinary retention 11/01/2023   Neurogenic bladder 10/18/2023   Hematuria 10/18/2023   Acute urinary retention 10/18/2023   Stress and adjustment reaction 01/13/2021   Inattention 10/28/2019   Hypothyroid 11/13/2016   Neuropathy involving both lower  extremities 11/13/2016   Cystitis, radiation 05/22/2014   Cervical cancer (HCC) 09/01/2013   Adenocarcinoma of cervix, 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   Asthma, mild intermittent 07/07/2009   INSOMNIA 05/28/2008   Generalized anxiety disorder 05/07/2008   HYPERTENSION, BENIGN 05/31/2007    PCP: Geraline Dorothyann JONETTA, MD  REFERRING PROVIDER: Skeet Juliene SAUNDERS, DO   REFERRING DIAG:  [phone] (ICD-10-CM) - Bilateral leg weakness G62.89 (ICD-10-CM) - 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 I am ok.    Eval: Right leg worse than left but both painful. Right has instances of knee just giving out, has neuropathy - like weakness, pain since radiation in 2015. The nerves have been affected and greatly limiting her activity. Does have symptoms of numbness, sensory deficits, weakness, ataxic gait, and painful but all vary based on the day.     Fluid intake: Yes: water    PAIN:  Are you having pain? No NPRS scale:0 Pain location: bil legs Right leg  Pain type: aching Pain description: constant   Aggravating factors: walking, end of day, rainy days Relieving factors: try not to walk, decreasing activities    PRECAUTIONS: Other: cervical cancer with radiation  RED FLAGS: None   WEIGHT BEARING RESTRICTIONS: No  FALLS:  Has patient fallen in last 6 months?  Yes. Number of falls 1x/weekly due to weakness and decreased balance from having the radiation, trouble with low light - reports she does need to furniture walk to help with balance  LIVING ENVIRONMENT: Lives with: lives with their family  OCCUPATION: sits with laptop on her lap for 12 hours per day; unable to sit with legs down due to them falling asleep  PLOF: Independent  PATIENT GOALS: to strengthening enough to walk across grass, wants to have better balance, stronger legs                                        PERTINENT HISTORY:  ADHD; Cervical Cancer 2014; 9 weeks of radiation; Radical hysterectomy;    OBJECTIVE:  Note: Objective measures were completed at Evaluation unless otherwise noted.  DIAGNOSTIC FINDINGS:   PATIENT SURVEYS:  Lower Extremity Functional Score: 5 / 80 = 6.3 % 06/16/24 LEFS 28 / 80 = 35.0 %  COGNITION: Overall cognitive status: Within functional limits for tasks assessed     SENSATION: Poor sensation at Rt LE - light touch felt along posterior gastroc only no awareness without visualization of being touched at medial, lateral, anterior lower leg, reports numbness in bil feet Rt>Lt  EDEMA:  Rt ankle sometimes swells per pt  MUSCLE LENGTH: Bil hamstrings and adductors limited by 25%;  POSTURE: rounded shoulders and forward head  LOWER EXTREMITY ROM:  weakness and poor coordination/proprioception   LOWER EXTREMITY MMT:  02/04/24 hip flexion was R 3/5, L 4/5  MMT Right Eval */5 Left Eval */5 Right 5/16 Left 5/16 RIGHT 6/6 LEFT  6/6  Hip flexion 2 3 4+ 5 3   Hip extension 2 3 4+ 4 4+ 4  Hip abduction 3 3+ 5- 5 5   Hip adduction 1+ 2+ 2+ 2+ 2+ 2+  Hip internal rotation        Hip external rotation        Knee flexion 2 3 3- 4 3+ 5  Knee extension 2+ 3 3- 5 3+   Ankle dorsiflexion 0 4 0 4+ long sitting 2 4  4+ long sitting 2    Ankle plantarflexion 3 4      Ankle inversion 1 4 3+ tested in long sitting 4+ tested in long sitting 3+ in  long sitting 5 in long sitting  Ankle eversion 2 4 3+ tested in long sitting 4- tested in long sitting 2+ 4- tested in long sitting   (Blank  rows = not tested)   FUNCTIONAL TESTS:  Five times Sit to Stand Test (FTSS) Method: Use a straight back chair with a solid seat that is 16-18" high. Ask participant to sit on the chair with arms folded across their chest.   Instructions: "Stand up and sit down as quickly as possible 5 times, keeping your arms folded across your chest."   Measurement: Stop timing when the participant stands the 5th time.  TIME: ______ (in seconds)  Times > 13.6 seconds is associated with increased disability and morbidity (Guralnik, 2000) Times > 15 seconds is predictive of recurrent falls in healthy individuals aged 82 and older (Buatois, et al., 2008) Normal performance values in community dwelling individuals aged 46 and older (Bohannon, 2006): 50-59 years: 8 seconds 60-69 years: 11.4 seconds 70-79 years: 12.6 seconds 80-89 years: 14.8 seconds  MCID: >= 2.3 seconds for Vestibular Disorders (Meretta, 2006)  5 times sit to stand: 22s with hands - very unstable with poor hip, posture, core control  Timed up and go (TUG): 28s with SBQC - min A  GAIT: Distance walked: 200' Assistive device utilized: Quad cane small base Level of assistance: CGA Comments: Rt LE demonstrating ataxic pattern, Lt trunk lean intermittently  to right self, poor step height bil, decreased stride length, limited glute activation    06/16/24 266 ft with SPC, improved step height and step length, intermittent LOB if pt turns to speak to therapist, but pt able to right herself independently. Minimal trunk lean.                                                                                                                               TREATMENT DATE:   07/02/24: Pt arrives for aquatic physical therapy. Treatment took place in 3.5-5.5 feet of water . Water  temperature was 91 degrees f. Pt  entered the pool via steps independently with use of bil rails. Pt requires buoyancy of water  for support and to offload joints with strengthening exercises.  Pt utilizes viscosity of the water  required for strengthening. Seated water  bench with 75% submersion Pt performed seated LE AROM exercises 20x in all planes, concurrent review of status with pt.  -Began with seated decompression to give LE moment to acclimate to water  and exercise. Pink ankle fin on LTLE and blue wt on RT. -60% water  depth water  walking with Qtip 8x each direction with Vc for control of LE and focus on heel -toe walking.as best she can.Added ankle fin on LT LE and 2.5# ankle weight on RTLE for increased proprioception and hip strenght -Standing core with pink water  bells 3x20: then wide tandem stance Bil with arms moving in opposistion 10x. -Hip 3 ways with ankle fins 2x20 -Rainbow floats pushing down by herside/hold and walk tall 4 lengths -Plank with yellow hand weights, toes on floor  20-30 sec 3x -Mini squat 3x5: holding really anything to help stabilize (floats, bells).  - Seated decompression with yellow noodle  for LE and pelvic floor relaxation   06/25/24:Pt arrives for aquatic physical therapy. Treatment took place in 3.5-5.5 feet of water . Water  temperature was 91 degrees f. Pt entered the pool via steps independently with use of bil rails. Pt requires buoyancy of water  for support and to offload joints with strengthening exercises.  Pt utilizes viscosity of the water  required for strengthening. Seated water  bench with 75% submersion Pt performed seated LE AROM exercises 20x in all planes, concurrent review of status with pt.  -Began with seated decompression to give LE moment to acclimate to water  and exercise. Pink ankle fin on LTLE and blue wt on RT. -60% water  depth water  walking with Qtip 8x each direction with Vc for control of LE and focus on heel -toe walking.as best she can.Added ankle fin on LT LE and 2.5#  ankle weight on RTLE for increased proprioception and hip strenght -Standing core with pink water  bells 3x20 -Hip 3 ways with ankle fins 2x20 -Rainbow floats pushing down by herside/hold and walk tall 4 lengths -Plank with yellow hand weights, toes on floor  20-30 sec 3x -Mini squat 3x5: holding really anything to help stabilize (floats, bells).  - Seated decompression with yellow noodle for LE and pelvic floor relaxation  06/23/24 NuStep seat 4 arms 9 L3 x 8' (PT present to discuss status Semi-reclined to wedge: yellow loop around thighs, ball squeeze 10x5, bridge with ball squeeze 10x5, Rt LE quad and glut set imprint press into mat table 10x5, red ball push down for core activation 10x5, red ball roll ups for core x10 Supine clams yellow loop 5 sec  hold x 10 Sidelying: side planks (elbow and knees) 5 x 5 sec hold right/left Modified plank on elbows max hold  - a few reps Sidelying: clam red band 5 sec hold 10x right/left Sidelying hip ABD 10X B Hooklying active hip ADDuction x 10   PATIENT EDUCATION:  Education details: [email or web address] - uses App Person educated: Patient Education method: Explanation, Demonstration, Tactile cues, Verbal cues, and Handouts Education comprehension: verbalized understanding, returned demonstration, verbal cues required, tactile cues required, and needs further education  HOME EXERCISE PROGRAM: Access Code: [email or web address] URL: [email or web address] Date: 04/07/2024 Prepared by: Mliss  Exercises - Supine Heel Slide with Strap  - 1 x daily - 7 x weekly - 3 sets - 10 reps - Pillow squeezes with kegel  - 1 x daily - 7 x weekly - 3 sets - 10 reps - Sit to Stand with Counter Support  - 1 x daily - 7 x weekly - 1 sets - 10 reps - Seated Long Arc Quad  - 1 x daily - 7 x weekly - 1-3 sets - 10 reps - 5 sec hold - Seated Isometric Hip Abduction with Resistance  - 1 x daily - 7 x weekly - 1-3 sets - 10 reps - Heel Raises with Counter Support  - 1 x  daily - 7 x weekly - 1-3 sets - 10 reps - Standing Terminal Knee Extension at Wall with Ball  - 2 x daily - 7 x weekly - 2 sets - 10 reps - 5 sec hold - Squat with Chair Touch and Resistance Loop  - 1 x daily - 3 x weekly - 2 sets - 10 reps - Standing Knee Flexion with Counter Support  - 1 x daily - 3 x weekly - 2 sets - 10 reps - Supine Bridge with Resistance Band  - 1 x daily - 7 x weekly -  1-2 sets - 10 reps - 5 sec hold - Standing Hip Abduction on Slider  - 1 x daily - 3 x weekly - 2 sets - 10 reps - Calf stretch on rocker board  - 1 x daily - 7 x weekly - 1 sets - 3 reps - 30 sec  hold - Standing Anti-Rotation Press with Anchored Resistance  - 1 x daily - 3 x weekly - 2 sets - 10 reps - Standing Trunk Rotation with Resistance  - 1 x daily - 3 x weekly - 2 sets - 10 reps  ASSESSMENT:  CLINICAL IMPRESSION: Added a wide tandem stance with arm movements today to further challenge balance and core stability. Pt had to move feet wider than she preferred to keep her balance. Pt tolerated all her exercises well, her back requires a stretch in between exercises today but they are short in duration and does not limit performing her exercises at all.    Eval: Patient is a 58 y.o. female  who was seen today for physical therapy evaluation and tr in the teatment for poor gait mechanics, impaired posture, impaired balance, decreased hip and core strength, increased fall risk. Pt has complex history of  Radiation-induced lumbosacral plexopathy and Polyradiculoneuropathy status post cervical cancer. Pt has completed pelvic floor PT recently and had positive outcomes with this and resolution of symptoms. Pt very motivated to participate in PT for bil LE strengthening and decreased fall risk. Pt demonstrated impairments with objective measurements of TUG and 5xSTS indicative of increased fall risk and balance deficits. Pt reports she is unable to hold and carry grandchildren, to walk on uneven surfaces, needs  assistance with dressing, walking, transfers sometimes and wants to be more I. Pt would benefit from additional PT to further address deficits.    OBJECTIVE IMPAIRMENTS: Abnormal gait, decreased activity tolerance, decreased coordination, decreased endurance, decreased mobility, difficulty walking, decreased strength, increased fascial restrictions, increased muscle spasms, impaired flexibility, impaired sensation, improper body mechanics, postural dysfunction, and pain.   ACTIVITY LIMITATIONS: carrying, lifting, bending, sitting, standing, squatting, stairs, transfers, bed mobility, locomotion level, and caring for others  PARTICIPATION LIMITATIONS: meal prep, cleaning, laundry, interpersonal relationship, driving, shopping, community activity, occupation, and yard work  PERSONAL FACTORS: Fitness, Past/current experiences, Time since onset of injury/illness/exacerbation, and 1 comorbidity: medical history are also affecting patient's functional outcome.   REHAB POTENTIAL: Good  CLINICAL DECISION MAKING: Evolving/moderate complexity  EVALUATION COMPLEXITY: Moderate   GOALS: Goals reviewed with patient? Yes  SHORT TERM GOALS: Target date: 02/13/24 Pt to be I with HEP for carry over and continuing recommendations for improved outcomes.   Baseline: Goal status: MET  2.  Pt to demonstrate at least 3/5 Rt LE strength grossly and 4/5 Lt for improved pelvic stability and functional squats without falling.  Baseline:  Goal status: IPARTIALLY MET 03/03/24  3.  Pt to demonstrate ability to complete static standing without AD for at least 5 mins at midline to complete gentle kitchen tasks/household tasks without fear of falling.  Baseline:  Goal status: MET  4.  Pt to demonstrate I with dynamic sitting balance to better be able to reach floor level for retrieving fallen objects and putting on socks without falling Baseline:  Goal status: MET 03/03/24  LONG TERM GOALS: Target date: 07/17/24  Pt  to be I with advanced HEP for carry over and continuing recommendations for improved outcomes.   Baseline:  Goal status: INITIAL  2.  Pt to report improved LEFS score to  at least 30/80 for improved I with functional tasks at home such as dressing, walking from room to room.  Baseline:  Goal status: IN PROGRESS 28/80 06/16/24  3.  Pt to demonstrate improved dynamic standing balance with or without AD at no more than CGA to better be able to play with grandchildren without falling.  Baseline:  Goal status: IN PROGRESS  4.  Pt to demonstrate improved gait mechanics with LRAD for 150' with minimal compensate strategies to I with pt safety with ambulating into a store.  Baseline:  Goal status: IN PROGRESS  9/22 266 ft with SPC   5.  Pt to demonstrate ability to perform x5 Sit to stand with at least 10# to more safely assist in caring for grandchildren without risk of falling.  Baseline:  Goal status: IN PROGRESS    PLAN:  PT FREQUENCY: 2x/week  PT DURATION: 12 weeks  PLANNED INTERVENTIONS:    97110-Therapeutic exercises, 97530- Therapeutic activity, [id number]- Neuromuscular re-education, 97535- Self Care, [id number]- Manual therapy, [phone]- Gait training, [phone]- Canalith repositioning, [id number]- Aquatic Therapy, [phone]- Splinting, [id number]- Electrical stimulation (manual), [id number]- Vasopneumatic device, [id number]- Ultrasound, [id number]- Traction (mechanical), [id number]- Ionotophoresis 4mg /ml Dexamethasone , Patient/Family education, Balance training, Stair training, Taping, Dry Needling, Joint mobilization, Joint manipulation, Spinal manipulation, Spinal mobilization, Scar mobilization, DME instructions, Wheelchair mobility training, Cryotherapy, Moist heat, and Biofeedback  NEXT VISIT: for aquatic PT, Work on ADDuction, core and LE strengthening and neuromuscular re-ed  for LAND: continue with resisted walking, focused standing glute med activities, hip ADDuctors and flexors   Delon Darner, PTA 07/02/24 10:02 AM

## 2024-07-06 NOTE — Therapy (Signed)
 OUTPATIENT PHYSICAL THERAPY FEMALE PELVIC AND ORTHO TREATMENT   Patient Name: Anna Ramsey MRN: [id number] DOB:April 18, 1966, 58 y.o., female Today's Date: 07/07/2024  END OF SESSION:  PT End of Session - 07/07/24 1237     Visit Number 36    Date for Recertification  07/17/24    Authorization Type [employer]    PT Start Time 1231    PT Stop Time 1320    PT Time Calculation (min) 49 min    Activity Tolerance Patient tolerated treatment well    Behavior During Therapy [facility] for tasks assessed/performed                   Past Medical History:  Diagnosis Date   ADHD (attention deficit hyperactivity disorder) 2024   Dysuria    Erythematous bladder mucosa    Heart murmur    asymptomatic per pt --  no echo   Hematuria    History of cervical cancer    12/ 2014  Stage IB2--  s/p  TAH w/ BSO and pelvic lymphadectomy/  and radiation therapy   History of radiation therapy 10/20/13-11/28/13   pelvis 50.4 gray   Hypertension    Lesion of bladder    Mild intermittent asthma    Neuromuscular disorder (HCC)    Seasonal allergies    Thyroid disease    Past Surgical History:  Procedure Laterality Date   CYSTOSCOPY WITH BIOPSY N/A 04/29/2015   Procedure: CYSTOSCOPY WITH BIOPSY WITH FULGERATION;  Surgeon: Belvie LITTIE Clara, MD;  Location: [facility];  Service: Urology;  Laterality: N/A;  1 HR [id number]   FOOT SURGERY Left 2010   RADICAL ABDOMINAL HYSTERECTOMY  09-05-2013   w/ BILATERAL SALPINGOOPHORECTOMY AND PELVIC LYMPHADECTOMY   Patient Active Problem List   Diagnosis Date Noted   ADD (attention deficit disorder) 04/21/2024   SUI (stress urinary incontinence, female) 11/01/2023   History of urinary retention 11/01/2023   Neurogenic bladder 10/18/2023   Hematuria 10/18/2023   Acute urinary retention 10/18/2023   Stress and adjustment reaction 01/13/2021   Inattention 10/28/2019   Hypothyroid 11/13/2016   Neuropathy involving both  lower extremities 11/13/2016   Cystitis, radiation 05/22/2014   Cervical cancer (HCC) 09/01/2013   Adenocarcinoma of cervix, 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   Asthma, mild intermittent 07/07/2009   INSOMNIA 05/28/2008   Generalized anxiety disorder 05/07/2008   HYPERTENSION, BENIGN 05/31/2007    PCP: Geraline Dorothyann JONETTA, MD  REFERRING PROVIDER: Skeet Juliene SAUNDERS, DO   REFERRING DIAG:  [phone] (ICD-10-CM) - Bilateral leg weakness G62.89 (ICD-10-CM) - 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 Today I'm better.   Eval: Right leg worse than left but both painful. Right has instances of knee just giving out, has neuropathy - like weakness, pain since radiation in 2015. The nerves have been affected and greatly limiting her activity. Does have symptoms of numbness, sensory deficits, weakness, ataxic gait, and painful but all vary based on the day.     Fluid intake: Yes: water    PAIN:  Are you having pain? No NPRS scale:0 Pain location: bil legs Right leg  Pain type: aching Pain description: constant   Aggravating factors: walking, end of day, rainy days Relieving factors: try not to walk, decreasing activities    PRECAUTIONS: Other: cervical cancer with radiation  RED FLAGS: None   WEIGHT BEARING RESTRICTIONS: No  FALLS:  Has patient fallen in last 6 months? Yes. Number of falls 1x/weekly due to weakness and decreased balance from having the  radiation, trouble with low light - reports she does need to furniture walk to help with balance  LIVING ENVIRONMENT: Lives with: lives with their family  OCCUPATION: sits with laptop on her lap for 12 hours per day; unable to sit with legs down due to them falling asleep  PLOF: Independent  PATIENT GOALS: to strengthening enough to walk across grass, wants to have better balance, stronger legs                                        PERTINENT HISTORY:  ADHD; Cervical Cancer 2014; 9 weeks of radiation; Radical hysterectomy;    OBJECTIVE:  Note: Objective measures were completed at Evaluation unless otherwise noted.  DIAGNOSTIC FINDINGS:   PATIENT SURVEYS:  Lower Extremity Functional Score: 5 / 80 = 6.3 % 06/16/24 LEFS 28 / 80 = 35.0 %  COGNITION: Overall cognitive status: Within functional limits for tasks assessed     SENSATION: Poor sensation at Rt LE - light touch felt along posterior gastroc only no awareness without visualization of being touched at medial, lateral, anterior lower leg, reports numbness in bil feet Rt>Lt  EDEMA:  Rt ankle sometimes swells per pt  MUSCLE LENGTH: Bil hamstrings and adductors limited by 25%;  POSTURE: rounded shoulders and forward head  LOWER EXTREMITY ROM:  weakness and poor coordination/proprioception   LOWER EXTREMITY MMT:  02/04/24 hip flexion was R 3/5, L 4/5  MMT Right Eval */5 Left Eval */5 Right 5/16 Left 5/16 RIGHT 6/6 LEFT  6/6  Hip flexion 2 3 4+ 5 3   Hip extension 2 3 4+ 4 4+ 4  Hip abduction 3 3+ 5- 5 5   Hip adduction 1+ 2+ 2+ 2+ 2+ 2+  Hip internal rotation        Hip external rotation        Knee flexion 2 3 3- 4 3+ 5  Knee extension 2+ 3 3- 5 3+   Ankle dorsiflexion 0 4 0 4+ long sitting 2 4  4+ long sitting 2    Ankle plantarflexion 3 4      Ankle inversion 1 4 3+ tested in long sitting 4+ tested in long sitting 3+ in long sitting 5 in long sitting  Ankle eversion 2 4 3+ tested in long sitting 4-  tested in long sitting 2+ 4- tested in long sitting   (Blank rows = not tested)   FUNCTIONAL TESTS:  Five times  Sit to Stand Test (FTSS) Method: Use a straight back chair with a solid seat that is 16-18" high. Ask participant to sit on the chair with arms folded across their chest.   Instructions: "Stand up and sit down as quickly as possible 5 times, keeping your arms folded across your chest."   Measurement: Stop timing when the participant stands the 5th time.  TIME: ______ (in seconds)  Times > 13.6 seconds is associated with increased disability and morbidity (Guralnik, 2000) Times > 15 seconds is predictive of recurrent falls in healthy individuals aged 23 and older (Buatois, et al., 2008) Normal performance values in community dwelling individuals aged 83 and older (Bohannon, 2006): 50-59 years: 8 seconds 60-69 years: 11.4 seconds 70-79 years: 12.6 seconds 80-89 years: 14.8 seconds  MCID: >= 2.3 seconds for Vestibular Disorders (Meretta, 2006)  5 times sit to stand: 22s with hands - very unstable with poor hip, posture, core control  Timed up and go (TUG): 28s with SBQC - min A  GAIT: Distance walked: 200' Assistive device utilized: Quad cane small base Level of assistance: CGA Comments: Rt LE demonstrating ataxic pattern, Lt trunk lean intermittently  to right self, poor step height bil, decreased stride length, limited glute activation    06/16/24 266 ft with SPC, improved step height and step length, intermittent LOB if pt turns to speak to therapist, but pt able to right herself independently. Minimal trunk lean.                                                                                                                               TREATMENT DATE:   07/07/24 NuStep seat 4 arms 9 L3 x 6' (PT present to discuss status) Sidelying: clam red band 5 sec hold 10x right/left Sidelying hip ABD 5 sec 10X B Semi-reclined to wedge: yellow loop around thighs, ball squeeze  10x5, bridge with ball squeeze 10x5, Rt LE quad and glut set imprint press into mat table 10x5, red ball push down for core activation 10x5, red ball roll ups for core x10 cues for neck position Hooklying active hip ADDuction x 5 - worked on various positions for AA ADDuction [employer]     07/02/24: Pt arrives for aquatic physical therapy. Treatment took place in 3.5-5.5 feet of water . Water  temperature was 91 degrees f. Pt entered the pool via steps independently with use of bil rails. Pt requires buoyancy of water  for support and to offload joints with strengthening exercises.  Pt utilizes viscosity of the water  required for strengthening. Seated water  bench with 75% submersion Pt performed seated LE AROM exercises 20x in all planes, concurrent review of status with pt.  -Began with seated decompression to give LE moment to acclimate to water  and exercise. Pink ankle fin on LTLE and blue wt on RT. -60% water  depth water  walking with Qtip 8x each direction with Vc for control of LE and focus on heel -toe walking.as best  she can.Added ankle fin on LT LE and 2.5# ankle weight on RTLE for increased proprioception and hip strenght -Standing core with pink water  bells 3x20: then wide tandem stance Bil with arms moving in opposistion 10x. -Hip 3 ways with ankle fins 2x20 -Rainbow floats pushing down by herside/hold and walk tall 4 lengths -Plank with yellow hand weights, toes on floor  20-30 sec 3x -Mini squat 3x5: holding really anything to help stabilize (floats, bells).  - Seated decompression with yellow noodle for LE and pelvic floor relaxation   06/25/24:Pt arrives for aquatic physical therapy. Treatment took place in 3.5-5.5 feet of water . Water  temperature was 91 degrees f. Pt entered the pool via steps independently with use of bil rails. Pt requires buoyancy of water  for support and to offload joints with strengthening exercises.  Pt utilizes viscosity of the water  required for  strengthening. Seated water  bench with 75% submersion Pt performed seated LE AROM exercises 20x in all planes, concurrent review of status with pt.  -Began with seated decompression to give LE moment to acclimate to water  and exercise. Pink ankle fin on LTLE and blue wt on RT. -60% water  depth water  walking with Qtip 8x each direction with Vc for control of LE and focus on heel -toe walking.as best she can.Added ankle fin on LT LE and 2.5# ankle weight on RTLE for increased proprioception and hip strenght -Standing core with pink water  bells 3x20 -Hip 3 ways with ankle fins 2x20 -Rainbow floats pushing down by herside/hold and walk tall 4 lengths -Plank with yellow hand weights, toes on floor  20-30 sec 3x -Mini squat 3x5: holding really anything to help stabilize (floats, bells).  - Seated decompression with yellow noodle for LE and pelvic floor relaxation  06/23/24 NuStep seat 4 arms 9 L3 x 8' (PT present to discuss status Semi-reclined to wedge: yellow loop around thighs, ball squeeze 10x5, bridge with ball squeeze 10x5, Rt LE quad and glut set imprint press into mat table 10x5, red ball push down for core activation 10x5, red ball roll ups for core x10 Supine clams yellow loop 5 sec  hold x 10 Sidelying: side planks (elbow and knees) 5 x 5 sec hold right/left Modified plank on elbows max hold  - a few reps Sidelying: clam red band 5 sec hold 10x right/left Sidelying hip ABD 10X B Hooklying active hip ADDuction x 10   PATIENT EDUCATION:  Education details: [email or web address] - uses App Person educated: Patient Education method: Explanation, Demonstration, Tactile cues, Verbal cues, and Handouts Education comprehension: verbalized understanding, returned demonstration, verbal cues required, tactile cues required, and needs further education  HOME EXERCISE PROGRAM: Access Code: [email or web address] URL: [email or web address] Date: 07/07/2024 Prepared by: Mliss  Exercises - Supine  Heel Slide with Strap  - 1 x daily - 7 x weekly - 3 sets - 10 reps - Pillow squeezes with kegel  - 1 x daily - 7 x weekly - 3 sets - 10 reps - Sit to Stand with Counter Support  - 1 x daily - 7 x weekly - 1 sets - 10 reps - Seated Long Arc Quad  - 1 x daily - 7 x weekly - 1-3 sets - 10 reps - 5 sec hold - Seated Isometric Hip Abduction with Resistance  - 1 x daily - 7 x weekly - 1-3 sets - 10 reps - Heel Raises with Counter Support  - 1 x daily - 7 x weekly - 1-3 sets - 10 reps - Standing Terminal  Knee Extension at Wall with Ball  - 2 x daily - 7 x weekly - 2 sets - 10 reps - 5 sec hold - Squat with Chair Touch and Resistance Loop  - 1 x daily - 3 x weekly - 2 sets - 10 reps - Standing Knee Flexion with Counter Support  - 1 x daily - 3 x weekly - 2 sets - 10 reps - Supine Bridge with Resistance Band  - 1 x daily - 7 x weekly - 1-2 sets - 10 reps - 5 sec hold - Standing Hip Abduction on Slider  - 1 x daily - 3 x weekly - 2 sets - 10 reps - Calf stretch on rocker board  - 1 x daily - 7 x weekly - 1 sets - 3 reps - 30 sec  hold - Standing Anti-Rotation Press with Anchored Resistance  - 1 x daily - 3 x weekly - 2 sets - 10 reps - Standing Trunk Rotation with Resistance  - 1 x daily - 3 x weekly - 2 sets - 10 reps - Right Modified Dix Hallpike with Pillows  - 1 x daily - 1 x weekly - 1 sets - 1 reps - 30 sec hold  ASSESSMENT:  CLINICAL IMPRESSION: Patient reporting return of vertigo since this weekend. Performed Trenda Craze for correction and educated patient on home treatment. Good engagement with all TE today. Adduction is still most challenging. Good activation with ball squeezes, but pt unable to actively Adduct in gravity eliminated positions. She reports improved ability to sit up partially in bed due to improved ab strength. Patient to be re-assessed at next land visit. She continues to demonstrate potential for improvement and would benefit from continued skilled therapy to address impairments.      Eval: Patient is a 58 y.o. female  who was seen today for physical therapy evaluation and tr in the teatment for poor gait mechanics, impaired posture, impaired balance, decreased hip and core strength, increased fall risk. Pt has complex history of  Radiation-induced lumbosacral plexopathy and Polyradiculoneuropathy status post cervical cancer. Pt has completed pelvic floor PT recently and had positive outcomes with this and resolution of symptoms. Pt very motivated to participate in PT for bil LE strengthening and decreased fall risk. Pt demonstrated impairments with objective measurements of TUG and 5xSTS indicative of increased fall risk and balance deficits. Pt reports she is unable to hold and carry grandchildren, to walk on uneven surfaces, needs assistance with dressing, walking, transfers sometimes and wants to be more I. Pt would benefit from additional PT to further address deficits.    OBJECTIVE IMPAIRMENTS: Abnormal gait, decreased activity tolerance, decreased coordination, decreased endurance, decreased mobility, difficulty walking, decreased strength, increased fascial restrictions, increased muscle spasms, impaired flexibility, impaired sensation, improper body mechanics, postural dysfunction, and pain.   ACTIVITY LIMITATIONS: carrying, lifting, bending, sitting, standing, squatting, stairs, transfers, bed mobility, locomotion level, and caring for others  PARTICIPATION LIMITATIONS: meal prep, cleaning, laundry, interpersonal relationship, driving, shopping, community activity, occupation, and yard work  PERSONAL FACTORS: Fitness, Past/current experiences, Time since onset of injury/illness/exacerbation, and 1 comorbidity: medical history are also affecting patient's functional outcome.   REHAB POTENTIAL: Good  CLINICAL DECISION MAKING: Evolving/moderate complexity  EVALUATION COMPLEXITY: Moderate   GOALS: Goals reviewed with patient? Yes  SHORT TERM GOALS: Target date:  02/13/24 Pt to be I with HEP for carry over and continuing recommendations for improved outcomes.   Baseline: Goal status: MET  2.  Pt to demonstrate  at least 3/5 Rt LE strength grossly and 4/5 Lt for improved pelvic stability and functional squats without falling.  Baseline:  Goal status: IPARTIALLY MET 03/03/24  3.  Pt to demonstrate ability to complete static standing without AD for at least 5 mins at midline to complete gentle kitchen tasks/household tasks without fear of falling.  Baseline:  Goal status: MET  4.  Pt to demonstrate I with dynamic sitting balance to better be able to reach floor level for retrieving fallen objects and putting on socks without falling Baseline:  Goal status: MET 03/03/24  LONG TERM GOALS: Target date: 07/17/24  Pt to be I with advanced HEP for carry over and continuing recommendations for improved outcomes.   Baseline:  Goal status: INITIAL  2.  Pt to report improved LEFS score to at least 30/80 for improved I with functional tasks at home such as dressing, walking from room to room.  Baseline:  Goal status: IN PROGRESS 28/80 06/16/24  3.  Pt to demonstrate improved dynamic standing balance with or without AD at no more than CGA to better be able to play with grandchildren without falling.  Baseline:  Goal status: IN PROGRESS  4.  Pt to demonstrate improved gait mechanics with LRAD for 150' with minimal compensate strategies to I with pt safety with ambulating into a store.  Baseline:  Goal status: IN PROGRESS  9/22 266 ft with SPC   5.  Pt to demonstrate ability to perform x5 Sit to stand with at least 10# to more safely assist in caring for grandchildren without risk of falling.  Baseline:  Goal status: IN PROGRESS    PLAN:  PT FREQUENCY: 2x/week  PT DURATION: 12 weeks  PLANNED INTERVENTIONS:    97110-Therapeutic exercises, 97530- Therapeutic activity, [id number]- Neuromuscular re-education, 97535- Self Care, [id number]- Manual therapy, [id number]-  Gait training, [phone]- Canalith repositioning, [id number]- Aquatic Therapy, [id number]- Splinting, [id number]- Electrical stimulation (manual), [id number]- Vasopneumatic device, [id number]- Ultrasound, [id number]- Traction (mechanical), [id number]- Ionotophoresis 4mg /ml Dexamethasone , Patient/Family education, Balance training, Stair training, Taping, Dry Needling, Joint mobilization, Joint manipulation, Spinal manipulation, Spinal mobilization, Scar mobilization, DME instructions, Wheelchair mobility training, Cryotherapy, Moist heat, and Biofeedback  NEXT VISIT: for aquatic PT, Work on ADDuction, core and LE strengthening and neuromuscular re-ed  for LAND: continue with resisted walking, focused standing glute med activities, hip ADDuctors/Slider? and flexors   Mliss Cummins, PT 07/07/24 1:32 PM

## 2024-07-07 ENCOUNTER — Ambulatory Visit: Admitting: Physical Therapy

## 2024-07-07 ENCOUNTER — Encounter: Payer: Self-pay | Admitting: Physical Therapy

## 2024-07-07 DIAGNOSIS — M6281 Muscle weakness (generalized): Secondary | ICD-10-CM | POA: Diagnosis not present

## 2024-07-07 DIAGNOSIS — M62838 Other muscle spasm: Secondary | ICD-10-CM

## 2024-07-07 DIAGNOSIS — R269 Unspecified abnormalities of gait and mobility: Secondary | ICD-10-CM

## 2024-07-07 DIAGNOSIS — R293 Abnormal posture: Secondary | ICD-10-CM

## 2024-07-08 ENCOUNTER — Ambulatory Visit (HOSPITAL_BASED_OUTPATIENT_CLINIC_OR_DEPARTMENT_OTHER): Attending: Physical Therapy | Admitting: Physical Therapy

## 2024-07-08 ENCOUNTER — Encounter (HOSPITAL_BASED_OUTPATIENT_CLINIC_OR_DEPARTMENT_OTHER): Payer: Self-pay | Admitting: Physical Therapy

## 2024-07-08 DIAGNOSIS — R269 Unspecified abnormalities of gait and mobility: Secondary | ICD-10-CM | POA: Insufficient documentation

## 2024-07-08 DIAGNOSIS — R293 Abnormal posture: Secondary | ICD-10-CM | POA: Diagnosis present

## 2024-07-08 DIAGNOSIS — M6281 Muscle weakness (generalized): Secondary | ICD-10-CM | POA: Insufficient documentation

## 2024-07-08 DIAGNOSIS — M62838 Other muscle spasm: Secondary | ICD-10-CM | POA: Insufficient documentation

## 2024-07-08 NOTE — Therapy (Signed)
 OUTPATIENT PHYSICAL THERAPY FEMALE PELVIC AND ORTHO TREATMENT   Patient Name: Anna Ramsey MRN: [id number] DOB:[birth year], 58 y.o., female Today's Date: 07/08/2024  END OF SESSION:  PT End of Session - 07/08/24 1706     Visit Number 37    Date for Recertification  07/17/24    Authorization Type [employer]    PT Start Time 1615    PT Stop Time 1700    PT Time Calculation (min) 45 min    Activity Tolerance Patient tolerated treatment well    Behavior During Therapy [facility] for tasks assessed/performed                    Past Medical History:  Diagnosis Date   ADHD (attention deficit hyperactivity disorder) 2024   Dysuria    Erythematous bladder mucosa    Heart murmur    asymptomatic per pt --  no echo   Hematuria    History of cervical cancer    12/ 2014  Stage IB2--  s/p  TAH w/ BSO and pelvic lymphadectomy/  and radiation therapy   History of radiation therapy 10/20/13-11/28/13   pelvis 50.4 gray   Hypertension    Lesion of bladder    Mild intermittent asthma    Neuromuscular disorder (HCC)    Seasonal allergies    Thyroid disease    Past Surgical History:  Procedure Laterality Date   CYSTOSCOPY WITH BIOPSY N/A 04/29/2015   Procedure: CYSTOSCOPY WITH BIOPSY WITH FULGERATION;  Surgeon: Belvie LITTIE Clara, MD;  Location: [facility];  Service: Urology;  Laterality: N/A;  1 HR [phone] [id number]   FOOT SURGERY Left 2010   RADICAL ABDOMINAL HYSTERECTOMY  09-05-2013   w/ BILATERAL SALPINGOOPHORECTOMY AND PELVIC LYMPHADECTOMY   Patient Active Problem List   Diagnosis Date Noted   ADD (attention deficit disorder) 04/21/2024   SUI (stress urinary incontinence, female) 11/01/2023   History of urinary retention 11/01/2023   Neurogenic bladder 10/18/2023   Hematuria 10/18/2023   Acute urinary retention 10/18/2023   Stress and adjustment reaction 01/13/2021   Inattention 10/28/2019   Hypothyroid 11/13/2016   Neuropathy involving both  lower extremities 11/13/2016   Cystitis, radiation 05/22/2014   Cervical cancer (HCC) 09/01/2013   Adenocarcinoma of cervix, 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   Asthma, mild intermittent 07/07/2009   INSOMNIA 05/28/2008   Generalized anxiety disorder 05/07/2008   HYPERTENSION, BENIGN 05/31/2007    PCP: Geraline Dorothyann JONETTA, MD  REFERRING PROVIDER: Skeet Juliene SAUNDERS, DO   REFERRING DIAG:  [phone] (ICD-10-CM) - Bilateral leg weakness G62.89 (ICD-10-CM) - 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 Right has instances of knee just giving out, has neuropathy - like weakness, pain since radiation in 2015. The nerves have been affected and greatly limiting her activity. Does have symptoms of numbness, sensory deficits, weakness, ataxic gait, and painful but all vary based on the day.     Fluid intake: Yes: water    PAIN:  Are you having pain? No NPRS scale:0 Pain location: bil legs Right leg  Pain type: aching Pain description: constant   Aggravating factors: walking, end of day, rainy days Relieving factors: try not to walk, decreasing activities    PRECAUTIONS: Other: cervical cancer with radiation  RED FLAGS: None   WEIGHT BEARING RESTRICTIONS: No  FALLS:  Has patient fallen in last 6 months? Yes. Number of falls 1x/weekly due to weakness and decreased balance from having the radiation,  trouble with low light - reports she does need to furniture walk to help with balance  LIVING ENVIRONMENT: Lives with: lives with their family  OCCUPATION: sits with laptop on her lap for 12 hours per day; unable to sit with legs down due to them falling asleep  PLOF: Independent  PATIENT GOALS: to strengthening enough to walk across grass, wants to have better balance, stronger legs                                        PERTINENT HISTORY:  ADHD; Cervical Cancer 2014; 9 weeks of radiation; Radical hysterectomy;    OBJECTIVE:  Note: Objective measures were completed at Evaluation unless otherwise noted.  DIAGNOSTIC FINDINGS:   PATIENT SURVEYS:  Lower Extremity Functional Score: 5 / 80 = 6.3 % 06/16/24 LEFS 28 / 80 = 35.0 %  COGNITION: Overall cognitive status: Within functional limits for tasks assessed     SENSATION: Poor sensation at Rt LE - light touch felt along posterior gastroc only no awareness without visualization of being touched at medial, lateral, anterior lower leg, reports numbness in bil feet Rt>Lt  EDEMA:  Rt ankle sometimes swells per pt  MUSCLE LENGTH: Bil hamstrings and adductors limited by 25%;  POSTURE: rounded shoulders and forward head  LOWER EXTREMITY ROM:  weakness and poor coordination/proprioception   LOWER EXTREMITY MMT:  02/04/24 hip flexion was R 3/5, L 4/5  MMT Right Eval */5 Left Eval */5 Right 5/16 Left 5/16 RIGHT 6/6 LEFT  6/6  Hip flexion 2 3 4+ 5 3   Hip extension 2 3 4+ 4 4+ 4  Hip abduction 3 3+ 5- 5 5   Hip adduction 1+ 2+ 2+ 2+ 2+ 2+  Hip internal rotation        Hip external rotation        Knee flexion 2 3 3- 4 3+ 5  Knee extension 2+ 3 3- 5 3+   Ankle dorsiflexion 0 4 0 4+ long sitting 2 4  4+ long sitting 2    Ankle plantarflexion 3 4      Ankle inversion 1 4 3+ tested in long sitting 4+ tested in long sitting 3+ in long sitting 5 in long sitting  Ankle eversion 2 4 3+ tested in long sitting 4- tested in long  sitting 2+ 4- tested in long sitting   (Blank rows = not tested)   FUNCTIONAL TESTS:  Five times  Sit to Stand Test (FTSS) Method: Use a straight back chair with a solid seat that is 16-18" high. Ask participant to sit on the chair with arms folded across their chest.   Instructions: "Stand up and sit down as quickly as possible 5 times, keeping your arms folded across your chest."   Measurement: Stop timing when the participant stands the 5th time.  TIME: ______ (in seconds)  Times > 13.6 seconds is associated with increased disability and morbidity (Guralnik, 2000) Times > 15 seconds is predictive of recurrent falls in healthy individuals aged 26 and older (Buatois, et al., 2008) Normal performance values in community dwelling individuals aged 25 and older (Bohannon, 2006): 50-59 years: 8 seconds 60-69 years: 11.4 seconds 70-79 years: 12.6 seconds 80-89 years: 14.8 seconds  MCID: >= 2.3 seconds for Vestibular Disorders (Meretta, 2006)  5 times sit to stand: 22s with hands - very unstable with poor hip, posture, core control  Timed up and go (TUG): 28s with SBQC - min A  GAIT: Distance walked: 200' Assistive device utilized: Quad cane small base Level of assistance: CGA Comments: Rt LE demonstrating ataxic pattern, Lt trunk lean intermittently  to right self, poor step height bil, decreased stride length, limited glute activation    06/16/24 266 ft with SPC, improved step height and step length, intermittent LOB if pt turns to speak to therapist, but pt able to right herself independently. Minimal trunk lean.                                                                                                                               TREATMENT DATE:  07/08/24: Pt arrives for aquatic physical therapy. Treatment took place in 3.5-5.5 feet of water . Water  temperature was 91 degrees f. Pt entered the pool via steps independently with use of bil rails. Pt requires buoyancy of water  for  support and to offload joints with strengthening exercises.  Pt utilizes viscosity of the water  required for strengthening. Seated water  bench with 75% submersion   -Seated decompression to give LE moment to acclimate to water  and exercise. Pink ankle fin on LTLE and blue wt on RT for approximation  -60% water  depth water  walking with Qtip 8x each direction with Vc for control of LE  -toe walking then heel walking forward and back -Plank with RB hand weights, toes on floor  20-30 sec 3x -Plank with arm lifts x 3 -Plank on 3rd step with leg lifts x 5 -Rainbow floats pushing down by herside/hold and walk tall 4 lengths 3.6 ft then 3.8 ft (improved upright stance) -Reverse fly using RB x 5->yellow HB 2x5 -sitting balance on solid noodle->thick yellow noodle: elevating UE unilat then bilat; hands on lap   07/07/24 NuStep seat 4 arms 9 L3 x 6' (PT present to discuss status) Sidelying: clam red band 5 sec hold 10x right/left Sidelying hip ABD 5 sec 10X B Semi-reclined to wedge: yellow loop around thighs, ball  squeeze 10x5, bridge with ball squeeze 10x5, Rt LE quad and glut set imprint press into mat table 10x5, red ball push down for core activation 10x5, red ball roll ups for core x10 cues for neck position Hooklying active hip ADDuction x 5 - worked on various positions for AA ADDuction Trenda Craze     07/02/24: Pt arrives for aquatic physical therapy. Treatment took place in 3.5-5.5 feet of water . Water  temperature was 91 degrees f. Pt entered the pool via steps independently with use of bil rails. Pt requires buoyancy of water  for support and to offload joints with strengthening exercises.  Pt utilizes viscosity of the water  required for strengthening. Seated water  bench with 75% submersion Pt performed seated LE AROM exercises 20x in all planes, concurrent review of status with pt.  -Began with seated decompression to give LE moment to acclimate to water  and exercise. Pink ankle fin  on LTLE and blue wt on RT. -60% water  depth water  walking with Qtip 8x each direction with Vc for control of LE and focus on heel -toe walking.as best she can.Added ankle fin on LT LE and 2.5# ankle weight on RTLE for increased proprioception and hip strenght -Standing core with pink water  bells 3x20: then wide tandem stance Bil with arms moving in opposistion 10x. -Hip 3 ways with ankle fins 2x20 -Rainbow floats pushing down by herside/hold and walk tall 4 lengths -Plank with yellow hand weights, toes on floor  20-30 sec 3x -Mini squat 3x5: holding really anything to help stabilize (floats, bells).  - Seated decompression with yellow noodle for LE and pelvic floor relaxation   06/25/24:Pt arrives for aquatic physical therapy. Treatment took place in 3.5-5.5 feet of water . Water  temperature was 91 degrees f. Pt entered the pool via steps independently with use of bil rails. Pt requires buoyancy of water  for support and to offload joints with strengthening exercises.  Pt utilizes viscosity of the water  required for strengthening. Seated water  bench with 75% submersion Pt performed seated LE AROM exercises 20x in all planes, concurrent review of status with pt.  -Began with seated decompression to give LE moment to acclimate to water  and exercise. Pink ankle fin on LTLE and blue wt on RT. -60% water  depth water  walking with Qtip 8x each direction with Vc for control of LE and focus on heel -toe walking.as best she can.Added ankle fin on LT LE and 2.5# ankle weight on RTLE for increased proprioception and hip strenght -Standing core with pink water  bells 3x20 -Hip 3 ways with ankle fins 2x20 -Rainbow floats pushing down by herside/hold and walk tall 4 lengths -Plank with yellow hand weights, toes on floor  20-30 sec 3x -Mini squat 3x5: holding really anything to help stabilize (floats, bells).  - Seated decompression with yellow noodle for LE and pelvic floor relaxation  06/23/24 NuStep seat 4 arms  9 L3 x 8' (PT present to discuss status Semi-reclined to wedge: yellow loop around thighs, ball squeeze 10x5, bridge with ball squeeze 10x5, Rt LE quad and glut set imprint press into mat table 10x5, red ball push down for core activation 10x5, red ball roll ups for core x10 Supine clams yellow loop 5 sec  hold x 10 Sidelying: side planks (elbow and knees) 5 x 5 sec hold right/left Modified plank on elbows max hold  - a few reps Sidelying: clam red band 5 sec hold 10x right/left Sidelying hip ABD 10X B Hooklying active hip ADDuction x 10   PATIENT EDUCATION:  Education details: [email or web address] - uses App Person educated: Patient Education method: Explanation, Demonstration, Tactile cues, Verbal cues, and Handouts Education comprehension: verbalized understanding, returned demonstration, verbal cues required, tactile cues required, and needs further education  HOME EXERCISE PROGRAM: Access Code: [email or web address] URL: [email or web address] Date: 07/07/2024 Prepared by: Mliss  Exercises - Supine Heel Slide with Strap  - 1 x daily - 7 x weekly - 3 sets - 10 reps - Pillow squeezes with kegel  - 1 x daily - 7 x weekly - 3 sets - 10 reps - Sit to Stand with Counter Support  - 1 x daily - 7 x weekly - 1 sets - 10 reps - Seated Long Arc Quad  - 1 x daily - 7 x weekly - 1-3 sets - 10 reps - 5 sec hold - Seated Isometric Hip Abduction with Resistance  - 1 x daily - 7 x weekly - 1-3 sets - 10 reps - Heel Raises with Counter Support  - 1 x daily - 7 x weekly - 1-3 sets - 10 reps - Standing Terminal Knee Extension at Wall with Ball  - 2 x daily - 7 x weekly - 2 sets - 10 reps - 5 sec hold - Squat with Chair Touch and Resistance Loop  - 1 x daily - 3 x weekly - 2 sets - 10 reps - Standing Knee Flexion with Counter Support  - 1 x daily - 3 x weekly - 2 sets - 10 reps - Supine Bridge with Resistance Band  - 1 x daily - 7 x weekly - 1-2 sets - 10 reps - 5 sec hold - Standing Hip Abduction on  Slider  - 1 x daily - 3 x weekly - 2 sets - 10 reps - Calf stretch on rocker board  - 1 x daily - 7 x weekly - 1 sets - 3 reps - 30 sec  hold - Standing Anti-Rotation Press with Anchored Resistance  - 1 x daily - 3 x weekly - 2 sets - 10 reps - Standing Trunk Rotation with Resistance  - 1 x daily - 3 x weekly - 2 sets - 10 reps - Right Modified Dix Hallpike with Pillows  - 1 x daily - 1 x weekly - 1 sets - 1 reps - 30 sec hold  ASSESSMENT:  CLINICAL IMPRESSION: No complaints of vertigo.  Pt does report 3 falls today but no injury.  Focus today on core strength and balance.  Cuing throughout for core engagement. Worked rle hip adduction with side stepping submerged. Pt tended to using hip flex to substitute.  She demonstrated very good core strength and control with sitting balance on noodle.  Pt mindful with all exercises and puts forth excellent effort. She continues to demonstrate potential for improvement and would benefit from continued skilled therapy to address impairments.       Eval: Patient is a 58 y.o. female  who was seen today for physical therapy evaluation and tr in the teatment for poor gait mechanics, impaired posture, impaired balance, decreased hip and core strength, increased fall risk. Pt has complex history of  Radiation-induced lumbosacral plexopathy and Polyradiculoneuropathy status post cervical cancer. Pt has completed pelvic floor PT recently and had positive outcomes with this and resolution of symptoms. Pt very motivated to participate in PT for bil LE strengthening and decreased fall risk. Pt demonstrated impairments with objective measurements of TUG and 5xSTS indicative of increased fall risk and balance deficits. Pt reports she is unable  to hold and carry grandchildren, to walk on uneven surfaces, needs assistance with dressing, walking, transfers sometimes and wants to be more I. Pt would benefit from additional PT to further address deficits.    OBJECTIVE IMPAIRMENTS:  Abnormal gait, decreased activity tolerance, decreased coordination, decreased endurance, decreased mobility, difficulty walking, decreased strength, increased fascial restrictions, increased muscle spasms, impaired flexibility, impaired sensation, improper body mechanics, postural dysfunction, and pain.   ACTIVITY LIMITATIONS: carrying, lifting, bending, sitting, standing, squatting, stairs, transfers, bed mobility, locomotion level, and caring for others  PARTICIPATION LIMITATIONS: meal prep, cleaning, laundry, interpersonal relationship, driving, shopping, community activity, occupation, and yard work  PERSONAL FACTORS: Fitness, Past/current experiences, Time since onset of injury/illness/exacerbation, and 1 comorbidity: medical history are also affecting patient's functional outcome.   REHAB POTENTIAL: Good  CLINICAL DECISION MAKING: Evolving/moderate complexity  EVALUATION COMPLEXITY: Moderate   GOALS: Goals reviewed with patient? Yes  SHORT TERM GOALS: Target date: 02/13/24 Pt to be I with HEP for carry over and continuing recommendations for improved outcomes.   Baseline: Goal status: MET  2.  Pt to demonstrate at least 3/5 Rt LE strength grossly and 4/5 Lt for improved pelvic stability and functional squats without falling.  Baseline:  Goal status: IPARTIALLY MET 03/03/24  3.  Pt to demonstrate ability to complete static standing without AD for at least 5 mins at midline to complete gentle kitchen tasks/household tasks without fear of falling.  Baseline:  Goal status: MET  4.  Pt to demonstrate I with dynamic sitting balance to better be able to reach floor level for retrieving fallen objects and putting on socks without falling Baseline:  Goal status: MET 03/03/24  LONG TERM GOALS: Target date: 07/17/24  Pt to be I with advanced HEP for carry over and continuing recommendations for improved outcomes.   Baseline:  Goal status: INITIAL  2.  Pt to report improved LEFS score  to at least 30/80 for improved I with functional tasks at home such as dressing, walking from room to room.  Baseline:  Goal status: IN PROGRESS 28/80 06/16/24  3.  Pt to demonstrate improved dynamic standing balance with or without AD at no more than CGA to better be able to play with grandchildren without falling.  Baseline:  Goal status: IN PROGRESS  4.  Pt to demonstrate improved gait mechanics with LRAD for 150' with minimal compensate strategies to I with pt safety with ambulating into a store.  Baseline:  Goal status: IN PROGRESS  9/22 266 ft with SPC   5.  Pt to demonstrate ability to perform x5 Sit to stand with at least 10# to more safely assist in caring for grandchildren without risk of falling.  Baseline:  Goal status: IN PROGRESS    PLAN:  PT FREQUENCY: 2x/week  PT DURATION: 12 weeks  PLANNED INTERVENTIONS:    97110-Therapeutic exercises, 97530- Therapeutic activity, [id number]- Neuromuscular re-education, 97535- Self Care, [id number]- Manual therapy, [phone]- Gait training, [phone]- Canalith repositioning, [id number]- Aquatic Therapy, [phone]- Splinting, [id number]- Electrical stimulation (manual), [id number]- Vasopneumatic device, [id number]- Ultrasound, [id number]- Traction (mechanical), [id number]- Ionotophoresis 4mg /ml Dexamethasone , Patient/Family education, Balance training, Stair training, Taping, Dry Needling, Joint mobilization, Joint manipulation, Spinal manipulation, Spinal mobilization, Scar mobilization, DME instructions, Wheelchair mobility training, Cryotherapy, Moist heat, and Biofeedback  NEXT VISIT: for aquatic PT, Work on ADDuction, core and LE strengthening and neuromuscular re-ed  for LAND: continue with resisted walking, focused standing glute med activities, hip ADDuctors/Slider? and flexors   Ronal Kem) Jayke Caul MPT 07/08/24 5:06 PM [place]  [facility] [address] [place], [place], [address] Phone: [phone]   Fax:   [phone]

## 2024-07-15 ENCOUNTER — Encounter: Payer: Self-pay | Admitting: Physical Therapy

## 2024-07-15 ENCOUNTER — Ambulatory Visit: Admitting: Physical Therapy

## 2024-07-15 DIAGNOSIS — M6281 Muscle weakness (generalized): Secondary | ICD-10-CM

## 2024-07-15 DIAGNOSIS — R269 Unspecified abnormalities of gait and mobility: Secondary | ICD-10-CM

## 2024-07-15 DIAGNOSIS — R293 Abnormal posture: Secondary | ICD-10-CM

## 2024-07-15 NOTE — Therapy (Signed)
 OUTPATIENT PHYSICAL THERAPY FEMALE PELVIC AND ORTHO RE-CERTIFICATION   Patient Name: Anna Ramsey MRN: [id number] DOB:[birth year], 58 y.o., female Today's Date: 07/15/2024  END OF SESSION:              Past Medical History:  Diagnosis Date   ADHD (attention deficit hyperactivity disorder) 2024   Dysuria    Erythematous bladder mucosa    Heart murmur    asymptomatic per pt --  no echo   Hematuria    History of cervical cancer    12/ 2014  Stage IB2--  s/p  TAH w/ BSO and pelvic lymphadectomy/  and radiation therapy   History of radiation therapy 10/20/13-11/28/13   pelvis 50.4 gray   Hypertension    Lesion of bladder    Mild intermittent asthma    Neuromuscular disorder (HCC)    Seasonal allergies    Thyroid disease    Past Surgical History:  Procedure Laterality Date   CYSTOSCOPY WITH BIOPSY N/A 04/29/2015   Procedure: CYSTOSCOPY WITH BIOPSY WITH FULGERATION;  Surgeon: Belvie LITTIE Clara, MD;  Location: [facility];  Service: Urology;  Laterality: N/A;  1 HR [phone] [id number]   FOOT SURGERY Left 2010   RADICAL ABDOMINAL HYSTERECTOMY  09-05-2013   w/ BILATERAL SALPINGOOPHORECTOMY AND PELVIC LYMPHADECTOMY   Patient Active Problem List   Diagnosis Date Noted   ADD (attention deficit disorder) 04/21/2024   SUI (stress urinary incontinence, female) 11/01/2023   History of urinary retention 11/01/2023   Neurogenic bladder 10/18/2023   Hematuria 10/18/2023   Acute urinary retention 10/18/2023   Stress and adjustment reaction 01/13/2021   Inattention 10/28/2019   Hypothyroid 11/13/2016   Neuropathy involving both lower extremities 11/13/2016   Cystitis, radiation 05/22/2014   Cervical cancer (HCC) 09/01/2013   Adenocarcinoma of cervix, 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  Rationale for Evaluation and Treatment: Rehabilitation  ONSET DATE: 2015  SUBJECTIVE:                                                                                                                                                                                           SUBJECTIVE STATEMENT:   I have really been struggling the past month. I have fallen 10x  in the past week. I've started using the cane in the house. My left shoulder is really hurting because I'm falling on my left outstretched arm. My legs are going numb anytime I'm weightbearing whether sitting or standing. They also throb to where I have to tap out from work because I can't concentrate.    Eval: Right leg worse than left but both painful. Right has instances of knee just giving out, has neuropathy - like weakness, pain since radiation in 2015. The nerves have been affected and greatly limiting her activity. Does have symptoms of numbness, sensory deficits, weakness, ataxic gait, and painful but all vary based on the day.     Fluid intake: Yes: water    PAIN: 10/21 Are you having pain? No NPRS scale:3/10 today getting up to 5/10 Pain location: bil legs Right leg  Pain type: aching Pain description: constant   Aggravating factors: walking, end of day, rainy days Relieving factors: try not to walk, decreasing activities    PRECAUTIONS: Other: cervical cancer with radiation  RED FLAGS: None   WEIGHT BEARING RESTRICTIONS: No  FALLS:  Has patient fallen in last 6 months? Yes. 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  DIAGNOSTIC FINDINGS:   PATIENT SURVEYS:  Lower Extremity Functional Score: 5 / 80 = 6.3 % 06/16/24 LEFS 28 / 80 = 35.0 % 07/15/24  19 / 80 = 23.8 %  COGNITION: Overall cognitive status: Within functional limits for tasks assessed     SENSATION: Poor sensation at Rt LE - light touch felt along posterior gastroc only no awareness without visualization of being touched at medial, lateral, anterior lower leg, reports numbness in bil feet Rt>Lt  EDEMA:  Rt ankle sometimes swells per pt  MUSCLE LENGTH: Bil hamstrings and adductors limited by 25%;  POSTURE: rounded shoulders and forward head  LOWER EXTREMITY ROM:  weakness and poor coordination/proprioception   LOWER EXTREMITY MMT:  02/04/24 hip flexion was R 3/5, L 4/5  MMT Right Eval */5 Left Eval */5 Right 5/16 Left 5/16 RIGHT 6/6 LEFT  6/6 Right 10/21 Left  10/21  Hip flexion 2 3 4+ 5 3  3  4+  Hip extension 2 3 4+ 4 4+ 4 3+ 4  Hip abduction 3 3+ 5- 5 5     Hip adduction 1+ 2+ 2+ 2+ 2+ 2+ 2+ 2+  Hip internal rotation          Hip external rotation          Knee flexion 2 3 3- 4 3+ 5 2+ 4  Knee extension 2+ 3 3- 5 3+     Ankle dorsiflexion 0 4 0 4+ long  sitting 2 4  4+ long sitting 2      Ankle plantarflexion 3 4        Ankle inversion 1 4 3+ tested in long sitting 4+ tested in long sitting 3+ in long sitting 5 in long sitting    Ankle eversion 2 4 3+  tested in long sitting 4- tested in long sitting 2+ 4- tested in long sitting     (Blank rows = not tested)   FUNCTIONAL TESTS:  Five times Sit to Stand Test (FTSS) Method: Use a straight back chair with a solid seat that is 16-18" high. Ask participant to sit on the chair with arms folded across their chest.   Instructions: "Stand up and sit down as quickly as possible 5 times, keeping your arms folded across your chest."   Measurement: Stop timing when the participant stands the 5th time.  TIME: ______ (in seconds)  Times > 13.6 seconds is associated with increased disability and morbidity (Guralnik, 2000) Times > 15 seconds is predictive of recurrent falls in healthy individuals aged 27 and older (Buatois, et al., 2008) Normal performance values in community dwelling individuals aged 34 and older (Bohannon, 2006): 50-59 years: 8 seconds 60-69 years: 11.4 seconds 70-79 years: 12.6 seconds 80-89 years: 14.8 seconds  MCID: >= 2.3 seconds for Vestibular Disorders (Meretta, 2006)  5 times sit to stand: 22s with hands - very unstable with poor hip, posture, core control  Timed up and go (TUG): 28s with SBQC - min A   07/15/24 37.10 sec (one stand with no UE support, 4 with B UE support and last two with min A upon standing)   GAIT: Distance walked: 200' Assistive device utilized: Quad cane small base Level of assistance: CGA Comments: Rt LE demonstrating ataxic pattern, Lt trunk lean intermittently  to right self, poor step height bil, decreased stride length, limited glute activation    06/16/24 266 ft with SPC, improved step height and step length, intermittent LOB if pt turns to speak to therapist, but pt able to right herself independently. Minimal trunk lean.                                                                                                                               TREATMENT DATE:  07/15/24 LEFS completed Discussion of current status and POC 5X STS 37.10  sec (one stand with no UE support, 4 with B UE support and last two with min A upon standing) Nustep L4 x 6.5 min Semi-reclined to wedge: yellow loop around thighs, bridge with ball squeeze 10x5, red ball push down for core activation 10x5, red ball roll ups for core 2 x10 cues for neck position    07/08/24: Pt arrives for aquatic physical therapy. Treatment took place in 3.5-5.5 feet of water . Water  temperature was 91 degrees f. Pt entered the pool via steps independently with use of bil rails. Pt requires buoyancy of water  for support and  to offload joints with strengthening exercises.  Pt utilizes viscosity of the water  required for strengthening. Seated water  bench with 75% submersion   -Seated decompression to give LE moment to acclimate to water  and exercise. Pink ankle fin on LTLE and blue wt on RT for approximation  -60% water  depth water  walking with Qtip 8x each direction with Vc for control of LE  -toe walking then heel walking forward and back -Plank with RB hand weights, toes on floor  20-30 sec 3x -Plank with arm lifts x 3 -Plank on 3rd step with leg lifts x 5 -Rainbow floats pushing down by herside/hold and walk tall 4 lengths 3.6 ft then 3.8 ft (improved upright stance) -Reverse fly using RB x 5->yellow HB 2x5 -sitting balance on solid noodle->thick yellow noodle: elevating UE unilat then bilat; hands on lap   07/07/24 NuStep seat 4 arms 9 L3 x 6' (PT present to discuss status) Sidelying: clam red band 5 sec hold 10x right/left Sidelying hip ABD 5 sec 10X B Semi-reclined to wedge: yellow loop around thighs, ball squeeze 10x5, bridge with ball squeeze 10x5, Rt LE quad and glut set imprint press into mat table 10x5, red ball push down for core activation 10x5, red ball roll ups for core x10 cues for neck position Hooklying active hip ADDuction x 5 - worked on various positions for AA ADDuction [employer]     07/02/24: Pt arrives for aquatic physical therapy.  Treatment took place in 3.5-5.5 feet of water . Water  temperature was 91 degrees f. Pt entered the pool via steps independently with use of bil rails. Pt requires buoyancy of water  for support and to offload joints with strengthening exercises.  Pt utilizes viscosity of the water  required for strengthening. Seated water  bench with 75% submersion Pt performed seated LE AROM exercises 20x in all planes, concurrent review of status with pt.  -Began with seated decompression to give LE moment to acclimate to water  and exercise. Pink ankle fin on LTLE and blue wt on RT. -60% water  depth water  walking with Qtip 8x each direction with Vc for control of LE and focus on heel -toe walking.as best she can.Added ankle fin on LT LE and 2.5# ankle weight on RTLE for increased proprioception and hip strenght -Standing core with pink water  bells 3x20: then wide tandem stance Bil with arms moving in opposistion 10x. -Hip 3 ways with ankle fins 2x20 -Rainbow floats pushing down by herside/hold and walk tall 4 lengths -Plank with yellow hand weights, toes on floor  20-30 sec 3x -Mini squat 3x5: holding really anything to help stabilize (floats, bells).  - Seated decompression with yellow noodle for LE and pelvic floor relaxation   06/25/24:Pt arrives for aquatic physical therapy. Treatment took place in 3.5-5.5 feet of water . Water  temperature was 91 degrees f. Pt entered the pool via steps independently with use of bil rails. Pt requires buoyancy of water  for support and to offload joints with strengthening exercises.  Pt utilizes viscosity of the water  required for strengthening. Seated water  bench with 75% submersion Pt performed seated LE AROM exercises 20x in all planes, concurrent review of status with pt.  -Began with seated decompression to give LE moment to acclimate to water  and exercise. Pink ankle fin on LTLE and blue wt on RT. -60% water  depth water  walking with Qtip 8x each direction with Vc for control of  LE and focus on heel -toe walking.as best she can.Added ankle fin on LT LE and 2.5# ankle weight  on RTLE for increased proprioception and hip strenght -Standing core with pink water  bells 3x20 -Hip 3 ways with ankle fins 2x20 -Rainbow floats pushing down by herside/hold and walk tall 4 lengths -Plank with yellow hand weights, toes on floor  20-30 sec 3x -Mini squat 3x5: holding really anything to help stabilize (floats, bells).  - Seated decompression with yellow noodle for LE and pelvic floor relaxation  06/23/24 NuStep seat 4 arms 9 L3 x 8' (PT present to discuss status Semi-reclined to wedge: yellow loop around thighs, ball squeeze 10x5, bridge with ball squeeze 10x5, Rt LE quad and glut set imprint press into mat table 10x5, red ball push down for core activation 10x5, red ball roll ups for core x10 Supine clams yellow loop 5 sec  hold x 10 Sidelying: side planks (elbow and knees) 5 x 5 sec hold right/left Modified plank on elbows max hold  - a few reps Sidelying: clam red band 5 sec hold 10x right/left Sidelying hip ABD 10X B Hooklying active hip ADDuction x 10   PATIENT EDUCATION:  Education details: [email or web address] - uses App Person educated: Patient Education method: Explanation, Demonstration, Tactile cues, Verbal cues, and Handouts Education comprehension: verbalized understanding, returned demonstration, verbal cues required, tactile cues required, and needs further education  HOME EXERCISE PROGRAM: Access Code: [email or web address] URL: [email or web address] Date: 07/07/2024 Prepared by: Mliss  Exercises - Supine Heel Slide with Strap  - 1 x daily - 7 x weekly - 3 sets - 10 reps - Pillow squeezes with kegel  - 1 x daily - 7 x weekly - 3 sets - 10 reps - Sit to Stand with Counter Support  - 1 x daily - 7 x weekly - 1 sets - 10 reps - Seated Long Arc Quad  - 1 x daily - 7 x weekly - 1-3 sets - 10 reps - 5 sec hold - Seated Isometric Hip Abduction with Resistance  - 1 x  daily - 7 x weekly - 1-3 sets - 10 reps - Heel Raises with Counter Support  - 1 x daily - 7 x weekly - 1-3 sets - 10 reps - Standing Terminal Knee Extension at Wall with Ball  - 2 x daily - 7 x weekly - 2 sets - 10 reps - 5 sec hold - Squat with Chair Touch and Resistance Loop  - 1 x daily - 3 x weekly - 2 sets - 10 reps - Standing Knee Flexion with Counter Support  - 1 x daily - 3 x weekly - 2 sets - 10 reps - Supine Bridge with Resistance Band  - 1 x daily - 7 x weekly - 1-2 sets - 10 reps - 5 sec hold - Standing Hip Abduction on Slider  - 1 x daily - 3 x weekly - 2 sets - 10 reps - Calf stretch on rocker board  - 1 x daily - 7 x weekly - 1 sets - 3 reps - 30 sec  hold - Standing Anti-Rotation Press with Anchored Resistance  - 1 x daily - 3 x weekly - 2 sets - 10 reps - Standing Trunk Rotation with Resistance  - 1 x daily - 3 x weekly - 2 sets - 10 reps - Right Modified Dix Hallpike with Pillows  - 1 x daily - 1 x weekly - 1 sets - 1 reps - 30 sec hold  ASSESSMENT:  CLINICAL IMPRESSION:  Patient reports significant change in status over the past month. She has fallen 10x  in the past week. These falls have occurred at home when not using an AD. She also reports increased B LE numbness with weightbearing whether sitting or standing. Her pain has increased to where she loses concentration when working or doing ADLS. Her LEFS which had improved 23 points at her last assessment declined by 9 points in the past month. She only able to complete one sit to stand with no UE assist and good control in 30 seconds. Her 5xSTS required UE assist and min A for stabilization upon standing today. Patient continues to demonstrate potential for improvement and would benefit from continued skilled therapy to address impairments and prevent further decline in functional status. Due to her recent decline in function, I have modified her LTGs to be more appropriate. She continues to do well in the pool focusing on  strength and balance and I recommend she continue skilled PT 2x/wk for 12 weeks split between land and aquatic therapy.       Eval: Patient is a 58 y.o. female  who was seen today for physical therapy evaluation and tr in the teatment for poor gait mechanics, impaired posture, impaired balance, decreased hip and core strength, increased fall risk. Pt has complex history of  Radiation-induced lumbosacral plexopathy and Polyradiculoneuropathy status post cervical cancer. Pt has completed pelvic floor PT recently and had positive outcomes with this and resolution of symptoms. Pt very motivated to participate in PT for bil LE strengthening and decreased fall risk. Pt demonstrated impairments with objective measurements of TUG and 5xSTS indicative of increased fall risk and balance deficits. Pt reports she is unable to hold and carry grandchildren, to walk on uneven surfaces, needs assistance with dressing, walking, transfers sometimes and wants to be more I. Pt would benefit from additional PT to further address deficits.    OBJECTIVE IMPAIRMENTS: Abnormal gait, decreased activity tolerance, decreased coordination, decreased endurance, decreased mobility, difficulty walking, decreased strength, increased fascial restrictions, increased muscle spasms, impaired flexibility, impaired sensation, improper body mechanics, postural dysfunction, and pain.   ACTIVITY LIMITATIONS: carrying, lifting, bending, sitting, standing, squatting, stairs, transfers, bed mobility, locomotion level, and caring for others  PARTICIPATION LIMITATIONS: meal prep, cleaning, laundry, interpersonal relationship, driving, shopping, community activity, occupation, and yard work  PERSONAL FACTORS: Fitness, Past/current experiences, Time since onset of injury/illness/exacerbation, and 1 comorbidity: medical history are also affecting patient's functional outcome.   REHAB POTENTIAL: Good  CLINICAL DECISION MAKING: Evolving/moderate  complexity  EVALUATION COMPLEXITY: Moderate   GOALS: Goals reviewed with patient? Yes  SHORT TERM GOALS: Target date: 02/13/24 Pt to be I with HEP for carry over and continuing recommendations for improved outcomes.   Baseline: Goal status: MET  2.  Pt to demonstrate at least 3/5 Rt LE strength grossly and 4/5 Lt for improved pelvic stability and functional squats without falling.  Baseline:  Goal status: PARTIALLY MET 03/03/24  3.  Pt to demonstrate ability to complete static standing without AD for at least 5 mins at midline to complete gentle kitchen tasks/household tasks without fear of falling.  Baseline:  Goal status: MET  4.  Pt to demonstrate I with dynamic sitting balance to better be able to reach floor level for retrieving fallen objects and putting on socks without falling Baseline:  Goal status: MET 03/03/24  LONG TERM GOALS: Target date: 10/10/24  Pt to be I with advanced HEP for carry over and continuing recommendations for improved outcomes.   Baseline:  Goal status: ongoing  2.  Pt to report improved  LEFS score to at least 30/80 for improved I with functional tasks at home such as dressing, walking from room to room.  Baseline:  Goal status: IN PROGRESS 28/80 06/16/24; 07/15/24  19 / 80 = 23.8 %  3.  Pt to demonstrate improved dynamic standing balance with or without AD at no more than CGA to better be able to play with grandchildren without falling.  Baseline:  Goal status: DEFERRED  4.  Pt to demonstrate improved gait mechanics with LRAD for 50 ' with minimal deviation from straight path to allow safe ambulation at home. Baseline:  Goal status: MODIFIED 07/15/24    5.  Pt to demonstrate ability to perform x5 Sit to stand with at least 10# to more safely assist in caring for grandchildren without risk of falling.  Baseline:  Goal status: DEFERRED  6.  Pt able to perform 3-5 sit to stands without UE assist showing improved functional LE strength Baseline: 1  in 30 sec on 07/15/24 Goal status: NEW  7.  Patient to report no falls when using AD at home or in the community. Baseline:  Goal status: NEW      PLAN:  PT FREQUENCY: 2x/week  PT DURATION: 12 weeks  PLANNED INTERVENTIONS:    97110-Therapeutic exercises, 97530- Therapeutic activity, [id number]- Neuromuscular re-education, 97535- Self Care, [id number]- Manual therapy, [phone]- Gait training, [phone]- Canalith repositioning, [id number]- Aquatic Therapy, [phone]- Splinting, [id number]- Electrical stimulation (manual), [id number]- Vasopneumatic device, [id number]- Ultrasound, [id number]- Traction (mechanical), [id number]- Ionotophoresis 4mg /ml Dexamethasone , Patient/Family education, Balance training, Stair training, Taping, Dry Needling, Joint mobilization, Joint manipulation, Spinal manipulation, Spinal mobilization, Scar mobilization, DME instructions, Wheelchair mobility training, Cryotherapy, Moist heat, and Biofeedback  NEXT VISIT: for aquatic PT, Work on ADDuction, core and LE strengthening and neuromuscular re-ed improved gait and mobility LAND: continue with gait training with cane and walker, sit to stands, core and LE strength   Mliss Cummins, PT 07/15/24 9:21 AM

## 2024-07-17 ENCOUNTER — Encounter: Payer: Self-pay | Admitting: Physical Therapy

## 2024-07-18 ENCOUNTER — Encounter: Payer: Self-pay | Admitting: Physical Therapy

## 2024-07-18 ENCOUNTER — Ambulatory Visit: Admitting: Physical Therapy

## 2024-07-18 DIAGNOSIS — M6281 Muscle weakness (generalized): Secondary | ICD-10-CM | POA: Diagnosis not present

## 2024-07-18 DIAGNOSIS — R293 Abnormal posture: Secondary | ICD-10-CM

## 2024-07-18 DIAGNOSIS — R279 Unspecified lack of coordination: Secondary | ICD-10-CM

## 2024-07-18 DIAGNOSIS — R102 Pelvic and perineal pain unspecified side: Secondary | ICD-10-CM

## 2024-07-18 DIAGNOSIS — R269 Unspecified abnormalities of gait and mobility: Secondary | ICD-10-CM

## 2024-07-18 DIAGNOSIS — M62838 Other muscle spasm: Secondary | ICD-10-CM

## 2024-07-18 NOTE — Therapy (Signed)
 OUTPATIENT PHYSICAL THERAPY FEMALE PELVIC AND ORTHO RE-CERTIFICATION   Patient Name: Anna Ramsey MRN: [id number] DOB:08/01/1966, 58 y.o., female Today's Date: 07/18/2024  END OF SESSION:  PT End of Session - 07/18/24 1434     Visit Number 39    Date for Recertification  10/10/24    Authorization Type [employer]    PT Start Time 1433    PT Stop Time 1515    PT Time Calculation (min) 42 min    Activity Tolerance Patient tolerated treatment well    Behavior During Therapy [facility] for tasks assessed/performed                     Past Medical History:  Diagnosis Date   ADHD (attention deficit hyperactivity disorder) 2024   Dysuria    Erythematous bladder mucosa    Heart murmur    asymptomatic per pt --  no echo   Hematuria    History of cervical cancer    12/ 2014  Stage IB2--  s/p  TAH w/ BSO and pelvic lymphadectomy/  and radiation therapy   History of radiation therapy 10/20/13-11/28/13   pelvis 50.4 gray   Hypertension    Lesion of bladder    Mild intermittent asthma    Neuromuscular disorder (HCC)    Seasonal allergies    Thyroid disease    Past Surgical History:  Procedure Laterality Date   CYSTOSCOPY WITH BIOPSY N/A 04/29/2015   Procedure: CYSTOSCOPY WITH BIOPSY WITH FULGERATION;  Surgeon: Belvie LITTIE Clara, MD;  Location: [facility];  Service: Urology;  Laterality: N/A;  1 HR [phone] [id number]   FOOT SURGERY Left 2010   RADICAL ABDOMINAL HYSTERECTOMY  09-05-2013   w/ BILATERAL SALPINGOOPHORECTOMY AND PELVIC LYMPHADECTOMY   Patient Active Problem List   Diagnosis Date Noted   ADD (attention deficit disorder) 04/21/2024   SUI (stress urinary incontinence, female) 11/01/2023   History of urinary retention 11/01/2023   Neurogenic bladder 10/18/2023   Hematuria 10/18/2023   Acute urinary retention 10/18/2023   Stress and adjustment reaction 01/13/2021   Inattention 10/28/2019   Hypothyroid 11/13/2016   Neuropathy  involving both lower extremities 11/13/2016   Cystitis, radiation 05/22/2014   Cervical cancer (HCC) 09/01/2013   Adenocarcinoma of cervix, 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   Asthma, mild intermittent 07/07/2009   INSOMNIA 05/28/2008   Generalized anxiety disorder 05/07/2008   HYPERTENSION, BENIGN 05/31/2007    PCP: Geraline Dorothyann JONETTA, MD  REFERRING PROVIDER: Skeet Juliene SAUNDERS, DO   REFERRING DIAG:  [phone] (ICD-10-CM) - Bilateral leg weakness G62.89 (ICD-10-CM) - 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    Eval: Right leg worse than left but both painful. Right has instances of knee just giving out, has neuropathy - like weakness, pain since radiation in 2015. The nerves have been affected and greatly limiting her activity. Does have symptoms of numbness, sensory deficits, weakness, ataxic gait, and painful but all vary based on the day.     Fluid intake: Yes: water    PAIN: 10/21 Are you having pain? Leg/foot feels heavy/tingly NPRS scale:3/10 today getting up to 5/10 Pain location: bil legs Right leg  Pain type: aching Pain description: constant   Aggravating factors: walking, end of day, rainy days Relieving factors: try not to walk, decreasing activities    PRECAUTIONS: Other: cervical cancer with radiation  RED FLAGS: None   WEIGHT BEARING RESTRICTIONS:  No  FALLS:  Has patient fallen in last 6 months? Yes. Number of falls 1x/weekly due to weakness and decreased balance from having the radiation, trouble with low light - reports she does need to furniture walk to help with balance  LIVING ENVIRONMENT: Lives with: lives with their family  OCCUPATION: sits with laptop on her lap for 12 hours per day; unable to sit with legs down due to them falling asleep  PLOF: Independent  PATIENT GOALS: to strengthening enough to walk across grass, wants to have better balance, stronger legs                                        PERTINENT HISTORY:  ADHD; Cervical Cancer 2014; 9 weeks of radiation; Radical hysterectomy;    OBJECTIVE:  Note: Objective measures were completed at Evaluation unless otherwise noted.  DIAGNOSTIC FINDINGS:   PATIENT SURVEYS:  Lower Extremity Functional Score: 5 / 80 = 6.3 % 06/16/24 LEFS 28 / 80 = 35.0 % 07/15/24  19 / 80 = 23.8 %  COGNITION: Overall cognitive status: Within functional limits for tasks assessed     SENSATION: Poor sensation at Rt LE - light touch felt along posterior gastroc only no awareness without visualization of being touched at medial, lateral, anterior lower leg, reports numbness in bil feet Rt>Lt  EDEMA:  Rt ankle sometimes swells per pt  MUSCLE LENGTH: Bil hamstrings and adductors limited by 25%;  POSTURE: rounded shoulders and forward head  LOWER EXTREMITY ROM:  weakness and poor coordination/proprioception   LOWER EXTREMITY MMT:  02/04/24 hip flexion was R 3/5, L 4/5  MMT Right Eval */5 Left Eval */5 Right 5/16 Left 5/16 RIGHT 6/6 LEFT  6/6 Right 10/21 Left  10/21  Hip flexion 2 3 4+ 5 3  3  4+  Hip extension 2 3 4+ 4 4+ 4 3+ 4  Hip abduction 3 3+ 5- 5 5     Hip adduction 1+ 2+ 2+ 2+ 2+ 2+ 2+ 2+  Hip internal rotation          Hip external rotation          Knee flexion 2 3 3- 4 3+ 5 2+ 4  Knee extension 2+ 3 3- 5 3+     Ankle dorsiflexion 0 4 0 4+ long sitting 2 4   4+ long sitting 2      Ankle plantarflexion 3 4        Ankle inversion 1 4 3+ tested in long sitting 4+ tested in long sitting 3+ in  long sitting 5 in long sitting    Ankle eversion 2 4 3+ tested in long sitting 4- tested in long sitting 2+ 4- tested in long sitting     (Blank rows = not tested)   FUNCTIONAL TESTS:  Five times Sit to Stand Test (FTSS) Method: Use a straight back chair with a solid seat that is 16-18" high. Ask participant to sit on the chair with arms folded across their chest.   Instructions: "Stand up and sit down as quickly as possible 5 times, keeping your arms folded across your chest."   Measurement: Stop timing when the participant stands the 5th time.  TIME: ______ (in seconds)  Times > 13.6 seconds is associated with increased disability and morbidity (Guralnik, 2000) Times > 15 seconds is predictive of recurrent falls in healthy individuals aged 91 and older (Buatois, et al., 2008) Normal performance values in community dwelling individuals aged 38 and older (Bohannon, 2006): 50-59 years: 8 seconds 60-69 years: 11.4 seconds 70-79 years: 12.6 seconds 80-89 years: 14.8 seconds  MCID: >= 2.3 seconds for Vestibular Disorders (Meretta, 2006)  5 times sit to stand: 22s with hands - very unstable with poor hip, posture, core control  Timed up and go (TUG): 28s with SBQC - min A   07/15/24 37.10 sec (one stand with no UE support, 4 with B UE support and last two with min A upon standing)   GAIT: Distance walked: 200' Assistive device utilized: Quad cane small base Level of assistance: CGA Comments: Rt LE demonstrating ataxic pattern, Lt trunk lean intermittently  to right self, poor step height bil, decreased stride length, limited glute activation    06/16/24 266 ft with SPC, improved step height and step length, intermittent LOB if pt turns to speak to therapist, but pt able to right herself independently. Minimal trunk lean.                                                                                                                                TREATMENT DATE:   07/18/24: Pt arrives for aquatic physical therapy. Treatment took place in 3.5-5.5 feet of water . Water  temperature was 91 degrees f. Pt entered the pool via steps independently with use of bil rails. Pt requires buoyancy of water  for support and to offload joints with strengthening exercises.  Pt utilizes viscosity of the water  required for strengthening. Seated water  bench with 75% submersion   -Seated decompression to give LE moment to acclimate to water  and exercise. Verbal education on what Pilates is an how it could help her.   -60% water  depth water  walking with Qtip 4x each direction with Vc for control of LE Did 6 lengths with side stepping to focus on adductors -static core with arm sway hold pink bells 2x20 -Plank with RB hand weights, toes on floor  20-30 sec 3x -Plank with arm lifts x 10 -Plank on 3rd step with leg lifts x 10 -  Rainbow floats pushing down by herside/hold and walk tall 4 lengths 3.6 ft then 3.8 ft (improved upright stance) -sitting balance on solid noodle->thick yellow noodle: elevating UE unilat then bilat; hands on lap    07/15/24 LEFS completed Discussion of current status and POC 5X STS 37.10 sec (one stand with no UE support, 4 with B UE support and last two with min A upon standing) Nustep L4 x 6.5 min Semi-reclined to wedge: yellow loop around thighs, bridge with ball squeeze 10x5, red ball push down for core activation 10x5, red ball roll ups for core 2 x10 cues for neck position    07/08/24: Pt arrives for aquatic physical therapy. Treatment took place in 3.5-5.5 feet of water . Water  temperature was 91 degrees f. Pt entered the pool via steps independently with use of bil rails. Pt requires buoyancy of water  for support and to offload joints with strengthening exercises.  Pt utilizes viscosity of the water  required for strengthening.  Seated water  bench with 75% submersion   -Seated decompression to give LE moment to acclimate to water  and exercise. Pink ankle fin on LTLE and blue wt on RT for approximation  -60% water  depth water  walking with Qtip 8x each direction with Vc for control of LE  -toe walking then heel walking forward and back -Plank with RB hand weights, toes on floor  20-30 sec 3x -Plank with arm lifts x 3 -Plank on 3rd step with leg lifts x 5 -Rainbow floats pushing down by herside/hold and walk tall 4 lengths 3.6 ft then 3.8 ft (improved upright stance) -Reverse fly using RB x 5->yellow HB 2x5 -sitting balance on solid noodle->thick yellow noodle: elevating UE unilat then bilat; hands on lap    PATIENT EDUCATION:  Education details: [email or web address] - uses App Person educated: Patient Education method: Explanation, Demonstration, Tactile cues, Verbal cues, and Handouts Education comprehension: verbalized understanding, returned demonstration, verbal cues required, tactile cues required, and needs further education  HOME EXERCISE PROGRAM: Access Code: [email or web address] URL: [email or web address] Date: 07/07/2024 Prepared by: Mliss  Exercises - Supine Heel Slide with Strap  - 1 x daily - 7 x weekly - 3 sets - 10 reps - Pillow squeezes with kegel  - 1 x daily - 7 x weekly - 3 sets - 10 reps - Sit to Stand with Counter Support  - 1 x daily - 7 x weekly - 1 sets - 10 reps - Seated Long Arc Quad  - 1 x daily - 7 x weekly - 1-3 sets - 10 reps - 5 sec hold - Seated Isometric Hip Abduction with Resistance  - 1 x daily - 7 x weekly - 1-3 sets - 10 reps - Heel Raises with Counter Support  - 1 x daily - 7 x weekly - 1-3 sets - 10 reps - Standing Terminal Knee Extension at Wall with Ball  - 2 x daily - 7 x weekly - 2 sets - 10 reps - 5 sec hold - Squat with Chair Touch and Resistance Loop  - 1 x daily - 3 x weekly - 2 sets - 10 reps - Standing Knee Flexion with Counter Support  - 1 x daily - 3 x weekly - 2  sets - 10 reps - Supine Bridge with Resistance Band  - 1 x daily - 7 x weekly - 1-2 sets - 10 reps - 5 sec hold - Standing Hip Abduction on Slider  - 1 x daily - 3 x weekly - 2 sets - 10  reps - Calf stretch on rocker board  - 1 x daily - 7 x weekly - 1 sets - 3 reps - 30 sec  hold - Standing Anti-Rotation Press with Anchored Resistance  - 1 x daily - 3 x weekly - 2 sets - 10 reps - Standing Trunk Rotation with Resistance  - 1 x daily - 3 x weekly - 2 sets - 10 reps - Right Modified Dix Hallpike with Pillows  - 1 x daily - 1 x weekly - 1 sets - 1 reps - 30 sec hold  ASSESSMENT:  CLINICAL IMPRESSION: pt has not fallen since she started using her rolling walker. She is considering trying a few sessions of [employer] equipment Pilates when she gets back from her trip. Lt shoulder is sore from her falls so plank on the stairs was tough for that reason, otherwise she does very well with her core stabilization.       Eval: Patient is a 58 y.o. female  who was seen today for physical therapy evaluation and tr in the teatment for poor gait mechanics, impaired posture, impaired balance, decreased hip and core strength, increased fall risk. Pt has complex history of  Radiation-induced lumbosacral plexopathy and Polyradiculoneuropathy status post cervical cancer. Pt has completed pelvic floor PT recently and had positive outcomes with this and resolution of symptoms. Pt very motivated to participate in PT for bil LE strengthening and decreased fall risk. Pt demonstrated impairments with objective measurements of TUG and 5xSTS indicative of increased fall risk and balance deficits. Pt reports she is unable to hold and carry grandchildren, to walk on uneven surfaces, needs assistance with dressing, walking, transfers sometimes and wants to be more I. Pt would benefit from additional PT to further address deficits.    OBJECTIVE IMPAIRMENTS: Abnormal gait, decreased activity tolerance, decreased coordination,  decreased endurance, decreased mobility, difficulty walking, decreased strength, increased fascial restrictions, increased muscle spasms, impaired flexibility, impaired sensation, improper body mechanics, postural dysfunction, and pain.   ACTIVITY LIMITATIONS: carrying, lifting, bending, sitting, standing, squatting, stairs, transfers, bed mobility, locomotion level, and caring for others  PARTICIPATION LIMITATIONS: meal prep, cleaning, laundry, interpersonal relationship, driving, shopping, community activity, occupation, and yard work  PERSONAL FACTORS: Fitness, Past/current experiences, Time since onset of injury/illness/exacerbation, and 1 comorbidity: medical history are also affecting patient's functional outcome.   REHAB POTENTIAL: Good  CLINICAL DECISION MAKING: Evolving/moderate complexity  EVALUATION COMPLEXITY: Moderate   GOALS: Goals reviewed with patient? Yes  SHORT TERM GOALS: Target date: 02/13/24 Pt to be I with HEP for carry over and continuing recommendations for improved outcomes.   Baseline: Goal status: MET  2.  Pt to demonstrate at least 3/5 Rt LE strength grossly and 4/5 Lt for improved pelvic stability and functional squats without falling.  Baseline:  Goal status: PARTIALLY MET 03/03/24  3.  Pt to demonstrate ability to complete static standing without AD for at least 5 mins at midline to complete gentle kitchen tasks/household tasks without fear of falling.  Baseline:  Goal status: MET  4.  Pt to demonstrate I with dynamic sitting balance to better be able to reach floor level for retrieving fallen objects and putting on socks without falling Baseline:  Goal status: MET 03/03/24  LONG TERM GOALS: Target date: 10/10/24  Pt to be I with advanced HEP for carry over and continuing recommendations for improved outcomes.   Baseline:  Goal status: ongoing  2.  Pt to report improved LEFS score to at least 30/80 for improved  I with functional tasks at home such as  dressing, walking from room to room.  Baseline:  Goal status: IN PROGRESS 28/80 06/16/24; 07/15/24  19 / 80 = 23.8 %  3.  Pt to demonstrate improved dynamic standing balance with or without AD at no more than CGA to better be able to play with grandchildren without falling.  Baseline:  Goal status: DEFERRED  4.  Pt to demonstrate improved gait mechanics with LRAD for 50 ' with minimal deviation from straight path to allow safe ambulation at home. Baseline:  Goal status: MODIFIED 07/15/24    5.  Pt to demonstrate ability to perform x5 Sit to stand with at least 10# to more safely assist in caring for grandchildren without risk of falling.  Baseline:  Goal status: DEFERRED  6.  Pt able to perform 3-5 sit to stands without UE assist showing improved functional LE strength Baseline: 1 in 30 sec on 07/15/24 Goal status: NEW  7.  Patient to report no falls when using AD at home or in the community. Baseline:  Goal status: NEW      PLAN:  PT FREQUENCY: 2x/week  PT DURATION: 12 weeks  PLANNED INTERVENTIONS:    97110-Therapeutic exercises, 97530- Therapeutic activity, [id number]- Neuromuscular re-education, 97535- Self Care, [id number]- Manual therapy, [id number]- Gait training, [phone]- Canalith repositioning, [id number]- Aquatic Therapy, [phone]- Splinting, [id number]- Electrical stimulation (manual), [id number]- Vasopneumatic device, [id number]- Ultrasound, [id number]- Traction (mechanical), [id number]- Ionotophoresis 4mg /ml Dexamethasone , Patient/Family education, Balance training, Stair training, Taping, Dry Needling, Joint mobilization, Joint manipulation, Spinal manipulation, Spinal mobilization, Scar mobilization, DME instructions, Wheelchair mobility training, Cryotherapy, Moist heat, and Biofeedback  NEXT VISIT: for aquatic PT, Work on ADDuction, core and LE strengthening and neuromuscular re-ed improved gait and mobility LAND: continue with gait training with cane and walker, sit to stands, core and LE strength   Delon Darner, PTA 07/18/24 3:12 PM

## 2024-07-23 ENCOUNTER — Encounter: Payer: Self-pay | Admitting: Rehabilitative and Restorative Service Providers"

## 2024-07-23 ENCOUNTER — Ambulatory Visit: Payer: Self-pay | Admitting: Rehabilitative and Restorative Service Providers"

## 2024-07-23 DIAGNOSIS — R269 Unspecified abnormalities of gait and mobility: Secondary | ICD-10-CM

## 2024-07-23 DIAGNOSIS — M6281 Muscle weakness (generalized): Secondary | ICD-10-CM | POA: Diagnosis not present

## 2024-07-23 DIAGNOSIS — R293 Abnormal posture: Secondary | ICD-10-CM

## 2024-07-23 NOTE — Therapy (Signed)
 OUTPATIENT PHYSICAL THERAPY FEMALE PELVIC AND ORTHO RE-CERTIFICATION   Patient Name: Anna Ramsey MRN: [id number] DOB:1966/08/09, 58 y.o., female Today's Date: 07/23/2024  END OF SESSION:  PT End of Session - 07/23/24 0808     Visit Number 40    Date for Recertification  10/10/24    Authorization Type [employer]    PT Start Time 0802    PT Stop Time 0841    PT Time Calculation (min) 39 min    Activity Tolerance Patient tolerated treatment well    Behavior During Therapy [facility] for tasks assessed/performed                     Past Medical History:  Diagnosis Date   ADHD (attention deficit hyperactivity disorder) 2024   Dysuria    Erythematous bladder mucosa    Heart murmur    asymptomatic per pt --  no echo   Hematuria    History of cervical cancer    12/ 2014  Stage IB2--  s/p  TAH w/ BSO and pelvic lymphadectomy/  and radiation therapy   History of radiation therapy 10/20/13-11/28/13   pelvis 50.4 gray   Hypertension    Lesion of bladder    Mild intermittent asthma    Neuromuscular disorder (HCC)    Seasonal allergies    Thyroid disease    Past Surgical History:  Procedure Laterality Date   CYSTOSCOPY WITH BIOPSY N/A 04/29/2015   Procedure: CYSTOSCOPY WITH BIOPSY WITH FULGERATION;  Surgeon: Belvie LITTIE Clara, MD;  Location: [facility];  Service: Urology;  Laterality: N/A;  1 HR [phone] [id number]   FOOT SURGERY Left 2010   RADICAL ABDOMINAL HYSTERECTOMY  09-05-2013   w/ BILATERAL SALPINGOOPHORECTOMY AND PELVIC LYMPHADECTOMY   Patient Active Problem List   Diagnosis Date Noted   ADD (attention deficit disorder) 04/21/2024   SUI (stress urinary incontinence, female) 11/01/2023   History of urinary retention 11/01/2023   Neurogenic bladder 10/18/2023   Hematuria 10/18/2023   Acute urinary retention 10/18/2023   Stress and adjustment reaction 01/13/2021   Inattention 10/28/2019   Hypothyroid 11/13/2016   Neuropathy  involving both lower extremities 11/13/2016   Cystitis, radiation 05/22/2014   Cervical cancer (HCC) 09/01/2013   Adenocarcinoma of cervix, 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   Asthma, mild intermittent 07/07/2009   INSOMNIA 05/28/2008   Generalized anxiety disorder 05/07/2008   HYPERTENSION, BENIGN 05/31/2007    PCP: Geraline Dorothyann JONETTA, MD  REFERRING PROVIDER: Skeet Juliene SAUNDERS, DO   REFERRING DIAG:  [phone] (ICD-10-CM) - Bilateral leg weakness G62.89 (ICD-10-CM) - 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  States that she has not had any falls since she started using the walker at home and enjoys that she can use it to carry items in her home.    Eval: Right leg worse than left but both painful. Right has instances of knee just giving out, has neuropathy - like weakness, pain since radiation in 2015. The nerves have been affected and greatly limiting her activity. Does have symptoms of numbness, sensory deficits, weakness, ataxic gait, and painful but all vary based on the day.     Fluid intake: Yes: water    PAIN: 07/23/24 Are you having pain? Leg/foot feels heavy/tingly NPRS scale:2/10 Pain location: bil legs Right leg  Pain type: aching Pain description: constant   Aggravating factors: walking, end of day, rainy days Relieving factors: try not to walk, decreasing activities    PRECAUTIONS: Other: cervical cancer with  radiation  RED FLAGS: None   WEIGHT BEARING RESTRICTIONS: No  FALLS:  Has patient fallen in last 6 months? Yes. Number of falls 1x/weekly due to weakness and decreased balance from having the radiation, trouble with low light - reports she does need to furniture walk to help with balance  LIVING ENVIRONMENT: Lives with: lives with their family  OCCUPATION: sits with laptop on her lap for 12 hours per day; unable to sit with legs down due to them falling asleep  PLOF: Independent  PATIENT GOALS: to strengthening enough to walk across grass, wants to have better balance, stronger legs                                        PERTINENT HISTORY:  ADHD; Cervical Cancer 2014; 9 weeks of radiation; Radical hysterectomy;    OBJECTIVE:  Note: Objective measures were completed at Evaluation unless otherwise noted.  DIAGNOSTIC FINDINGS:   PATIENT SURVEYS:  Lower Extremity Functional Score: 5 / 80 = 6.3 % 06/16/24 LEFS 28 / 80 = 35.0 % 07/15/24  19 / 80 = 23.8 %  COGNITION: Overall cognitive status: Within functional limits for tasks assessed     SENSATION: Poor sensation at Rt LE - light touch felt along posterior gastroc only no awareness without visualization of being touched at medial, lateral, anterior lower leg, reports numbness in bil feet Rt>Lt  EDEMA:  Rt ankle sometimes swells per pt  MUSCLE LENGTH: Bil hamstrings and adductors limited by 25%;  POSTURE: rounded shoulders and forward head  LOWER EXTREMITY ROM:  weakness and poor coordination/proprioception   LOWER EXTREMITY MMT:  02/04/24 hip flexion was R 3/5, L 4/5  MMT Right Eval */5 Left Eval */5 Right 5/16 Left 5/16 RIGHT 6/6 LEFT  6/6 Right 10/21 Left  10/21  Hip flexion 2 3 4+ 5 3  3  4+  Hip extension 2 3 4+ 4 4+ 4 3+ 4  Hip abduction 3 3+ 5- 5 5     Hip adduction 1+ 2+ 2+ 2+ 2+ 2+ 2+ 2+  Hip internal rotation          Hip external rotation          Knee flexion 2 3 3- 4 3+ 5 2+ 4  Knee extension  2+ 3 3- 5 3+     Ankle dorsiflexion 0 4 0 4+ long sitting 2 4  4+ long sitting 2  Ankle plantarflexion 3 4        Ankle inversion 1 4 3+ tested in long sitting 4+ tested in long sitting 3+ in long sitting 5 in long sitting    Ankle eversion 2 4 3+ tested in long sitting 4- tested in long sitting 2+ 4- tested in long sitting     (Blank rows = not tested)   FUNCTIONAL TESTS:  Five times Sit to Stand Test (FTSS) Method: Use a straight back chair with a solid seat that is 16-18" high. Ask participant to sit on the chair with arms folded across their chest.   Instructions: "Stand up and sit down as quickly as possible 5 times, keeping your arms folded across your chest."   Measurement: Stop timing when the participant stands the 5th time.  TIME: ______ (in seconds)  Times > 13.6 seconds is associated with increased disability and morbidity (Guralnik, 2000) Times > 15 seconds is predictive of recurrent falls in healthy individuals aged 5 and older (Buatois, et al., 2008) Normal performance values in community dwelling individuals aged 58 and older (Bohannon, 2006): 50-59 years: 8 seconds 60-69 years: 11.4 seconds 70-79 years: 12.6 seconds 80-89 years: 14.8 seconds  MCID: >= 2.3 seconds for Vestibular Disorders (Meretta, 2006)  5 times sit to stand: 22s with hands - very unstable with poor hip, posture, core control  Timed up and go (TUG): 28s with SBQC - min A   07/15/24 37.10 sec (one stand with no UE support, 4 with B UE support and last two with min A upon standing)   GAIT: Distance walked: 200' Assistive device utilized: Quad cane small base Level of assistance: CGA Comments: Rt LE demonstrating ataxic pattern, Lt trunk lean intermittently  to right self, poor step height bil, decreased stride length, limited glute activation    06/16/24 266 ft with SPC, improved step height and step length, intermittent LOB if pt turns to speak to therapist, but pt able to right herself  independently. Minimal trunk lean.                                                                                                                               TREATMENT DATE:   07/23/2024: Nustep level 5 (green machine) x6 min with PT present to discuss status Side stepping at barre down and back 3 laps Standing at barre performing unilateral foot tap over small hurdle and back x10 bilat Standing shoulder rows with yellow tband 2x10 Standing shoulder extension with yellow tband 2x10 Standing unilateral row with twist with yellow tband x10 bilat Seated with foot step up and over and back on small cone x5 (pt with difficulty with left LE) Standing with foot tapping laterally to each side of body x10 bilat   07/18/24: Pt arrives for aquatic physical therapy. Treatment took place in 3.5-5.5 feet of water . Water  temperature was 91 degrees f. Pt entered the pool via steps independently with use of bil  rails. Pt requires buoyancy of water  for support and to offload joints with strengthening exercises.  Pt utilizes viscosity of the water  required for strengthening. Seated water  bench with 75% submersion   -Seated decompression to give LE moment to acclimate to water  and exercise. Verbal education on what Pilates is an how it could help her.   -60% water  depth water  walking with Qtip 4x each direction with Vc for control of LE Did 6 lengths with side stepping to focus on adductors -static core with arm sway hold pink bells 2x20 -Plank with RB hand weights, toes on floor  20-30 sec 3x -Plank with arm lifts x 10 -Plank on 3rd step with leg lifts x 10 -Rainbow floats pushing down by herside/hold and walk tall 4 lengths 3.6 ft then 3.8 ft (improved upright stance) -sitting balance on solid noodle->thick yellow noodle: elevating UE unilat then bilat; hands on lap    07/15/24 LEFS completed Discussion of current status and POC 5X STS 37.10 sec (one stand with no UE support, 4 with B UE support  and last two with min A upon standing) Nustep L4 x 6.5 min Semi-reclined to wedge: yellow loop around thighs, bridge with ball squeeze 10x5, red ball push down for core activation 10x5, red ball roll ups for core 2 x10 cues for neck position    PATIENT EDUCATION:  Education details: [email or web address] - uses App Person educated: Patient Education method: Explanation, Demonstration, Tactile cues, Verbal cues, and Handouts Education comprehension: verbalized understanding, returned demonstration, verbal cues required, tactile cues required, and needs further education  HOME EXERCISE PROGRAM: Access Code: [email or web address] URL: [email or web address] Date: 07/07/2024 Prepared by: Mliss  Exercises - Supine Heel Slide with Strap  - 1 x daily - 7 x weekly - 3 sets - 10 reps - Pillow squeezes with kegel  - 1 x daily - 7 x weekly - 3 sets - 10 reps - Sit to Stand with Counter Support  - 1 x daily - 7 x weekly - 1 sets - 10 reps - Seated Long Arc Quad  - 1 x daily - 7 x weekly - 1-3 sets - 10 reps - 5 sec hold - Seated Isometric Hip Abduction with Resistance  - 1 x daily - 7 x weekly - 1-3 sets - 10 reps - Heel Raises with Counter Support  - 1 x daily - 7 x weekly - 1-3 sets - 10 reps - Standing Terminal Knee Extension at Wall with Ball  - 2 x daily - 7 x weekly - 2 sets - 10 reps - 5 sec hold - Squat with Chair Touch and Resistance Loop  - 1 x daily - 3 x weekly - 2 sets - 10 reps - Standing Knee Flexion with Counter Support  - 1 x daily - 3 x weekly - 2 sets - 10 reps - Supine Bridge with Resistance Band  - 1 x daily - 7 x weekly - 1-2 sets - 10 reps - 5 sec hold - Standing Hip Abduction on Slider  - 1 x daily - 3 x weekly - 2 sets - 10 reps - Calf stretch on rocker board  - 1 x daily - 7 x weekly - 1 sets - 3 reps - 30 sec  hold - Standing Anti-Rotation Press with Anchored Resistance  - 1 x daily - 3 x weekly - 2 sets - 10 reps - Standing Trunk Rotation with Resistance  - 1 x daily - 3 x weekly  - 2  sets - 10 reps - Right Modified Dix Hallpike with Pillows  - 1 x daily - 1 x weekly - 1 sets - 1 reps - 30 sec hold  ASSESSMENT:  CLINICAL IMPRESSION:   Anna Ramsey presents to skilled PT reporting that she is having a good day today, she denies any falls since using her walker at home.  Patient was agreeable to working on some standing exercises today.  Patient continues to have difficulty with hip adduction exercises, more so in seated position than standing.  Patient with some difficulty with step over small hurdle, as well.  Patient able to maintain balance with static stance, but noted some sway when the row with twist was added in.  Patient continues to require skilled PT to progress towards goal related activities.     Eval: Patient is a 58 y.o. female  who was seen today for physical therapy evaluation and tr in the teatment for poor gait mechanics, impaired posture, impaired balance, decreased hip and core strength, increased fall risk. Pt has complex history of  Radiation-induced lumbosacral plexopathy and Polyradiculoneuropathy status post cervical cancer. Pt has completed pelvic floor PT recently and had positive outcomes with this and resolution of symptoms. Pt very motivated to participate in PT for bil LE strengthening and decreased fall risk. Pt demonstrated impairments with objective measurements of TUG and 5xSTS indicative of increased fall risk and balance deficits. Pt reports she is unable to hold and carry grandchildren, to walk on uneven surfaces, needs assistance with dressing, walking, transfers sometimes and wants to be more I. Pt would benefit from additional PT to further address deficits.    OBJECTIVE IMPAIRMENTS: Abnormal gait, decreased activity tolerance, decreased coordination, decreased endurance, decreased mobility, difficulty walking, decreased strength, increased fascial restrictions, increased muscle spasms, impaired flexibility, impaired sensation, improper body  mechanics, postural dysfunction, and pain.   ACTIVITY LIMITATIONS: carrying, lifting, bending, sitting, standing, squatting, stairs, transfers, bed mobility, locomotion level, and caring for others  PARTICIPATION LIMITATIONS: meal prep, cleaning, laundry, interpersonal relationship, driving, shopping, community activity, occupation, and yard work  PERSONAL FACTORS: Fitness, Past/current experiences, Time since onset of injury/illness/exacerbation, and 1 comorbidity: medical history are also affecting patient's functional outcome.   REHAB POTENTIAL: Good  CLINICAL DECISION MAKING: Evolving/moderate complexity  EVALUATION COMPLEXITY: Moderate   GOALS: Goals reviewed with patient? Yes  SHORT TERM GOALS: Target date: 02/13/24 Pt to be I with HEP for carry over and continuing recommendations for improved outcomes.   Baseline: Goal status: MET  2.  Pt to demonstrate at least 3/5 Rt LE strength grossly and 4/5 Lt for improved pelvic stability and functional squats without falling.  Baseline:  Goal status: PARTIALLY MET 03/03/24  3.  Pt to demonstrate ability to complete static standing without AD for at least 5 mins at midline to complete gentle kitchen tasks/household tasks without fear of falling.  Baseline:  Goal status: MET  4.  Pt to demonstrate I with dynamic sitting balance to better be able to reach floor level for retrieving fallen objects and putting on socks without falling Baseline:  Goal status: MET 03/03/24  LONG TERM GOALS: Target date: 10/10/24  Pt to be I with advanced HEP for carry over and continuing recommendations for improved outcomes.   Baseline:  Goal status: ongoing  2.  Pt to report improved LEFS score to at least 30/80 for improved I with functional tasks at home such as dressing, walking from room to room.  Baseline:  Goal status: IN PROGRESS 28/80 06/16/24;  07/15/24  19 / 80 = 23.8 %  3.  Pt to demonstrate improved dynamic standing balance with or without  AD at no more than CGA to better be able to play with grandchildren without falling.  Baseline:  Goal status: DEFERRED  4.  Pt to demonstrate improved gait mechanics with LRAD for 50 ' with minimal deviation from straight path to allow safe ambulation at home. Baseline:  Goal status: MODIFIED 07/15/24    5.  Pt to demonstrate ability to perform x5 Sit to stand with at least 10# to more safely assist in caring for grandchildren without risk of falling.  Baseline:  Goal status: DEFERRED  6.  Pt able to perform 3-5 sit to stands without UE assist showing improved functional LE strength Baseline: 1 in 30 sec on 07/15/24 Goal status: NEW  7.  Patient to report no falls when using AD at home or in the community. Baseline:  Goal status: Ongoing      PLAN:  PT FREQUENCY: 2x/week  PT DURATION: 12 weeks  PLANNED INTERVENTIONS:    97110-Therapeutic exercises, 97530- Therapeutic activity, [id number]- Neuromuscular re-education, 97535- Self Care, [id number]- Manual therapy, [phone]- Gait training, [phone]- Canalith repositioning, [id number]- Aquatic Therapy, [phone]- Splinting, [id number]- Electrical stimulation (manual), [id number]- Vasopneumatic device, [id number]- Ultrasound, [id number]- Traction (mechanical), [id number]- Ionotophoresis 4mg /ml Dexamethasone , Patient/Family education, Balance training, Stair training, Taping, Dry Needling, Joint mobilization, Joint manipulation, Spinal manipulation, Spinal mobilization, Scar mobilization, DME instructions, Wheelchair mobility training, Cryotherapy, Moist heat, and Biofeedback  NEXT VISIT: for aquatic PT, Work on ADDuction, core and LE strengthening and neuromuscular re-ed improved gait and mobility LAND: continue with gait training with cane and walker, sit to stands, core and LE strength    Jarrell Laming, PT, DPT 07/23/24, 8:52 AM  [facility] [address], [address] [place], [place] [address] Phone # [phone] Fax [phone]

## 2024-07-28 ENCOUNTER — Ambulatory Visit: Attending: Neurology

## 2024-07-28 DIAGNOSIS — R102 Pelvic and perineal pain unspecified side: Secondary | ICD-10-CM | POA: Insufficient documentation

## 2024-07-28 DIAGNOSIS — R279 Unspecified lack of coordination: Secondary | ICD-10-CM | POA: Insufficient documentation

## 2024-07-28 DIAGNOSIS — R293 Abnormal posture: Secondary | ICD-10-CM | POA: Insufficient documentation

## 2024-07-28 DIAGNOSIS — M6281 Muscle weakness (generalized): Secondary | ICD-10-CM | POA: Diagnosis present

## 2024-07-28 DIAGNOSIS — M62838 Other muscle spasm: Secondary | ICD-10-CM | POA: Insufficient documentation

## 2024-07-28 DIAGNOSIS — R269 Unspecified abnormalities of gait and mobility: Secondary | ICD-10-CM | POA: Insufficient documentation

## 2024-07-28 NOTE — Therapy (Signed)
 OUTPATIENT PHYSICAL THERAPY FEMALE PELVIC AND ORTHO RE-CERTIFICATION   Patient Name: Anna Ramsey MRN: [id number] DOB:[birth year], 58 y.o., female Today's Date: 07/28/2024  END OF SESSION:  PT End of Session - 07/28/24 0839     Visit Number 41    Date for Recertification  10/10/24    Authorization Type [employer]    PT Start Time 0759    PT Stop Time 0841    PT Time Calculation (min) 42 min    Activity Tolerance Patient tolerated treatment well    Behavior During Therapy [facility] for tasks assessed/performed                      Past Medical History:  Diagnosis Date   ADHD (attention deficit hyperactivity disorder) 2024   Dysuria    Erythematous bladder mucosa    Heart murmur    asymptomatic per pt --  no echo   Hematuria    History of cervical cancer    12/ 2014  Stage IB2--  s/p  TAH w/ BSO and pelvic lymphadectomy/  and radiation therapy   History of radiation therapy 10/20/13-11/28/13   pelvis 50.4 gray   Hypertension    Lesion of bladder    Mild intermittent asthma    Neuromuscular disorder (HCC)    Seasonal allergies    Thyroid disease    Past Surgical History:  Procedure Laterality Date   CYSTOSCOPY WITH BIOPSY N/A 04/29/2015   Procedure: CYSTOSCOPY WITH BIOPSY WITH FULGERATION;  Surgeon: Belvie LITTIE Clara, MD;  Location: [facility];  Service: Urology;  Laterality: N/A;  1 HR [phone] [id number]   FOOT SURGERY Left 2010   RADICAL ABDOMINAL HYSTERECTOMY  09-05-2013   w/ BILATERAL SALPINGOOPHORECTOMY AND PELVIC LYMPHADECTOMY   Patient Active Problem List   Diagnosis Date Noted   ADD (attention deficit disorder) 04/21/2024   SUI (stress urinary incontinence, female) 11/01/2023   History of urinary retention 11/01/2023   Neurogenic bladder 10/18/2023   Hematuria 10/18/2023   Acute urinary retention 10/18/2023   Stress and adjustment reaction 01/13/2021   Inattention 10/28/2019   Hypothyroid 11/13/2016   Neuropathy  involving both lower extremities 11/13/2016   Cystitis, radiation 05/22/2014   Cervical cancer (HCC) 09/01/2013   Adenocarcinoma of cervix, 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   Asthma, mild intermittent 07/07/2009   INSOMNIA 05/28/2008   Generalized anxiety disorder 05/07/2008   HYPERTENSION, BENIGN 05/31/2007    PCP: Geraline Dorothyann JONETTA, MD  REFERRING PROVIDER: Skeet Juliene SAUNDERS, DO   REFERRING DIAG:  [phone] (ICD-10-CM) - Bilateral leg weakness G62.89 (ICD-10-CM) - 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 No falls since last session.  Using walker in the house.      Eval: Right leg worse than left but both painful. Right has instances of knee just giving out, has neuropathy - like weakness, pain since radiation in 2015. The nerves have been affected and greatly limiting her activity. Does have symptoms of numbness, sensory deficits, weakness, ataxic gait, and painful but all vary based on the day.     Fluid intake: Yes: water    PAIN: 07/28/24 Are you having pain? Leg/foot feels heavy/tingly NPRS scale:0 /10 Pain location: bil legs Right leg  Pain type: aching Pain description: constant   Aggravating factors: walking, end of day, rainy days Relieving factors: try not to walk, decreasing activities    PRECAUTIONS: Other: cervical cancer with radiation  RED FLAGS: None   WEIGHT BEARING RESTRICTIONS: No  FALLS:  Has patient fallen in  last 6 months? Yes. Number of falls 1x/weekly due to weakness and decreased balance from having the radiation, trouble with low light - reports she does need to furniture walk to help with balance  LIVING ENVIRONMENT: Lives with: lives with their family  OCCUPATION: sits with laptop on her lap for 12 hours per day; unable to sit with legs down due to them falling asleep  PLOF: Independent  PATIENT GOALS: to strengthening enough to walk across grass, wants to have better balance, stronger legs                                        PERTINENT HISTORY:  ADHD; Cervical Cancer 2014; 9 weeks of radiation; Radical hysterectomy;    OBJECTIVE:  Note: Objective measures were completed at Evaluation unless otherwise noted.  DIAGNOSTIC FINDINGS:   PATIENT SURVEYS:  Lower Extremity Functional Score: 5 / 80 = 6.3 % 06/16/24 LEFS 28 / 80 = 35.0 % 07/15/24  19 / 80 = 23.8 %  COGNITION: Overall cognitive status: Within functional limits for tasks assessed     SENSATION: Poor sensation at Rt LE - light touch felt along posterior gastroc only no awareness without visualization of being touched at medial, lateral, anterior lower leg, reports numbness in bil feet Rt>Lt  EDEMA:  Rt ankle sometimes swells per pt  MUSCLE LENGTH: Bil hamstrings and adductors limited by 25%;  POSTURE: rounded shoulders and forward head  LOWER EXTREMITY ROM:  weakness and poor coordination/proprioception   LOWER EXTREMITY MMT:  02/04/24 hip flexion was R 3/5, L 4/5  MMT Right Eval */5 Left Eval */5 Right 5/16 Left 5/16 RIGHT 6/6 LEFT  6/6 Right 10/21 Left  10/21  Hip flexion 2 3 4+ 5 3  3  4+  Hip extension 2 3 4+ 4 4+ 4 3+ 4  Hip abduction 3 3+ 5- 5 5     Hip adduction 1+ 2+ 2+ 2+ 2+ 2+ 2+ 2+  Hip internal rotation          Hip external rotation          Knee flexion 2 3 3- 4 3+ 5 2+ 4  Knee extension 2+ 3 3- 5 3+     Ankle dorsiflexion 0 4 0 4+ long sitting 2 4  4+ long sitting 2      Ankle  plantarflexion 3 4        Ankle inversion 1 4 3+ tested in long sitting  4+ tested in long sitting 3+ in long sitting 5 in long sitting    Ankle eversion 2 4 3+ tested in long sitting 4- tested in long sitting 2+ 4- tested in long sitting     (Blank rows = not tested)   FUNCTIONAL TESTS:  Five times Sit to Stand Test (FTSS) Method: Use a straight back chair with a solid seat that is 16-18" high. Ask participant to sit on the chair with arms folded across their chest.   Instructions: "Stand up and sit down as quickly as possible 5 times, keeping your arms folded across your chest."   Measurement: Stop timing when the participant stands the 5th time.  TIME: ______ (in seconds)  Times > 13.6 seconds is associated with increased disability and morbidity (Guralnik, 2000) Times > 15 seconds is predictive of recurrent falls in healthy individuals aged 65 and older (Buatois, et al., 2008) Normal performance values in community dwelling individuals aged 63 and older (Bohannon, 2006): 50-59 years: 8 seconds 60-69 years: 11.4 seconds 70-79 years: 12.6 seconds 80-89 years: 14.8 seconds  MCID: >= 2.3 seconds for Vestibular Disorders (Meretta, 2006)  5 times sit to stand: 22s with hands - very unstable with poor hip, posture, core control  Timed up and go (TUG): 28s with SBQC - min A   07/15/24 37.10 sec (one stand with no UE support, 4 with B UE support and last two with min A upon standing)   GAIT: Distance walked: 200' Assistive device utilized: Quad cane small base Level of assistance: CGA Comments: Rt LE demonstrating ataxic pattern, Lt trunk lean intermittently  to right self, poor step height bil, decreased stride length, limited glute activation    06/16/24 266 ft with SPC, improved step height and step length, intermittent LOB if pt turns to speak to therapist, but pt able to right herself independently. Minimal trunk lean.                                                                                                                                TREATMENT DATE:   07/28/2024: Nustep level 5 (green machine) x6 min with PT present to discuss status Side stepping at barre down and back 3 laps Standing at barre performing unilateral foot tap over small hurdle and back x10 bilat Standing shoulder rows with yellow tband 2x10 Standing shoulder extension with yellow tband 2x10 Standing unilateral row with twist with yellow tband x10 bilat Seated with foot step up and over and back on small cone x5 (pt with difficulty with left LE) Standing with foot tapping laterally to each side of body x10 bilat, diagonal reverse tap, diagonal forward tap x10 each   07/23/2024: Nustep level 5 (green machine) x6 min with PT present to discuss status Side stepping at barre down and back 3 laps Standing at barre performing unilateral foot tap over small hurdle and back x10 bilat Standing shoulder rows with yellow tband 2x10  Standing shoulder extension with yellow tband 2x10 Standing unilateral row with twist with yellow tband x10 bilat Seated with foot step up and over and back on small cone x5 (pt with difficulty with left LE) Standing with foot tapping laterally to each side of body x10 bilat   07/18/24: Pt arrives for aquatic physical therapy. Treatment took place in 3.5-5.5 feet of water . Water  temperature was 91 degrees f. Pt entered the pool via steps independently with use of bil rails. Pt requires buoyancy of water  for support and to offload joints with strengthening exercises.  Pt utilizes viscosity of the water  required for strengthening. Seated water  bench with 75% submersion   -Seated decompression to give LE moment to acclimate to water  and exercise. Verbal education on what Pilates is an how it could help her.   -60% water  depth water  walking with Qtip 4x each direction with Vc for control of LE Did 6 lengths with side stepping to focus on adductors -static core with arm sway  hold pink bells 2x20 -Plank with RB hand weights, toes on floor  20-30 sec 3x -Plank with arm lifts x 10 -Plank on 3rd step with leg lifts x 10 -Rainbow floats pushing down by herside/hold and walk tall 4 lengths 3.6 ft then 3.8 ft (improved upright stance) -sitting balance on solid noodle->thick yellow noodle: elevating UE unilat then bilat; hands on lap   PATIENT EDUCATION:  Education details: [email or web address] - uses App Person educated: Patient Education method: Explanation, Demonstration, Tactile cues, Verbal cues, and Handouts Education comprehension: verbalized understanding, returned demonstration, verbal cues required, tactile cues required, and needs further education  HOME EXERCISE PROGRAM: Access Code: [email or web address] URL: [email or web address] Date: 07/07/2024 Prepared by: Mliss  Exercises - Supine Heel Slide with Strap  - 1 x daily - 7 x weekly - 3 sets - 10 reps - Pillow squeezes with kegel  - 1 x daily - 7 x weekly - 3 sets - 10 reps - Sit to Stand with Counter Support  - 1 x daily - 7 x weekly - 1 sets - 10 reps - Seated Long Arc Quad  - 1 x daily - 7 x weekly - 1-3 sets - 10 reps - 5 sec hold - Seated Isometric Hip Abduction with Resistance  - 1 x daily - 7 x weekly - 1-3 sets - 10 reps - Heel Raises with Counter Support  - 1 x daily - 7 x weekly - 1-3 sets - 10 reps - Standing Terminal Knee Extension at Wall with Ball  - 2 x daily - 7 x weekly - 2 sets - 10 reps - 5 sec hold - Squat with Chair Touch and Resistance Loop  - 1 x daily - 3 x weekly - 2 sets - 10 reps - Standing Knee Flexion with Counter Support  - 1 x daily - 3 x weekly - 2 sets - 10 reps - Supine Bridge with Resistance Band  - 1 x daily - 7 x weekly - 1-2 sets - 10 reps - 5 sec hold - Standing Hip Abduction on Slider  - 1 x daily - 3 x weekly - 2 sets - 10 reps - Calf stretch on rocker board  - 1 x daily - 7 x weekly - 1 sets - 3 reps - 30 sec  hold - Standing Anti-Rotation Press with Anchored  Resistance  - 1 x daily - 3 x weekly - 2 sets - 10 reps - Standing Trunk Rotation with Resistance  -  1 x daily - 3 x weekly - 2 sets - 10 reps - Right Modified Dix Hallpike with Pillows  - 1 x daily - 1 x weekly - 1 sets - 1 reps - 30 sec hold  ASSESSMENT:  CLINICAL IMPRESSION:  PT has been using a 4 wheeled rolling walker for all distances and has not had any recent falls.  She continues to be challenged by current level of exercise.  She does well with self-correction regarding form.  She fatigues quickly with stepping over cone in sitting on the Rt.  Pt is motivated and compliant and PT monitored throughout session for safety and to monitor for form and fatigue.  Patient continues to require skilled PT to progress towards goal related activities.     Eval: Patient is a 58 y.o. female  who was seen today for physical therapy evaluation and tr in the teatment for poor gait mechanics, impaired posture, impaired balance, decreased hip and core strength, increased fall risk. Pt has complex history of  Radiation-induced lumbosacral plexopathy and Polyradiculoneuropathy status post cervical cancer. Pt has completed pelvic floor PT recently and had positive outcomes with this and resolution of symptoms. Pt very motivated to participate in PT for bil LE strengthening and decreased fall risk. Pt demonstrated impairments with objective measurements of TUG and 5xSTS indicative of increased fall risk and balance deficits. Pt reports she is unable to hold and carry grandchildren, to walk on uneven surfaces, needs assistance with dressing, walking, transfers sometimes and wants to be more I. Pt would benefit from additional PT to further address deficits.    OBJECTIVE IMPAIRMENTS: Abnormal gait, decreased activity tolerance, decreased coordination, decreased endurance, decreased mobility, difficulty walking, decreased strength, increased fascial restrictions, increased muscle spasms, impaired flexibility, impaired  sensation, improper body mechanics, postural dysfunction, and pain.   ACTIVITY LIMITATIONS: carrying, lifting, bending, sitting, standing, squatting, stairs, transfers, bed mobility, locomotion level, and caring for others  PARTICIPATION LIMITATIONS: meal prep, cleaning, laundry, interpersonal relationship, driving, shopping, community activity, occupation, and yard work  PERSONAL FACTORS: Fitness, Past/current experiences, Time since onset of injury/illness/exacerbation, and 1 comorbidity: medical history are also affecting patient's functional outcome.   REHAB POTENTIAL: Good  CLINICAL DECISION MAKING: Evolving/moderate complexity  EVALUATION COMPLEXITY: Moderate   GOALS: Goals reviewed with patient? Yes  SHORT TERM GOALS: Target date: 02/13/24 Pt to be I with HEP for carry over and continuing recommendations for improved outcomes.   Baseline: Goal status: MET  2.  Pt to demonstrate at least 3/5 Rt LE strength grossly and 4/5 Lt for improved pelvic stability and functional squats without falling.  Baseline:  Goal status: PARTIALLY MET 03/03/24  3.  Pt to demonstrate ability to complete static standing without AD for at least 5 mins at midline to complete gentle kitchen tasks/household tasks without fear of falling.  Baseline:  Goal status: MET  4.  Pt to demonstrate I with dynamic sitting balance to better be able to reach floor level for retrieving fallen objects and putting on socks without falling Baseline:  Goal status: MET 03/03/24  LONG TERM GOALS: Target date: 10/10/24  Pt to be I with advanced HEP for carry over and continuing recommendations for improved outcomes.   Baseline:  Goal status: ongoing  2.  Pt to report improved LEFS score to at least 30/80 for improved I with functional tasks at home such as dressing, walking from room to room.  Baseline:  Goal status: IN PROGRESS 28/80 06/16/24; 07/15/24  19 / 80 =  23.8 %  3.  Pt to demonstrate improved dynamic standing  balance with or without AD at no more than CGA to better be able to play with grandchildren without falling.  Baseline:  Goal status: DEFERRED  4.  Pt to demonstrate improved gait mechanics with LRAD for 50 ' with minimal deviation from straight path to allow safe ambulation at home. Baseline:  Goal status: MODIFIED 07/15/24    5.  Pt to demonstrate ability to perform x5 Sit to stand with at least 10# to more safely assist in caring for grandchildren without risk of falling.  Baseline:  Goal status: DEFERRED  6.  Pt able to perform 3-5 sit to stands without UE assist showing improved functional LE strength Baseline: 1 in 30 sec on 07/15/24 Goal status: NEW  7.  Patient to report no falls when using AD at home or in the community. Baseline: using rolling walker at home and community (07/28/24) Goal status: Ongoing      PLAN:  PT FREQUENCY: 2x/week  PT DURATION: 12 weeks  PLANNED INTERVENTIONS:    97110-Therapeutic exercises, 97530- Therapeutic activity, 97112- Neuromuscular re-education, 97535- Self Care, 02859- Manual therapy, [phone]- Gait training, [phone]- Canalith repositioning, [id number]- Aquatic Therapy, [phone]- Splinting, [id number]- Electrical stimulation (manual), [id number]- Vasopneumatic device, [id number]- Ultrasound, [id number]- Traction (mechanical), [id number]- Ionotophoresis 4mg /ml Dexamethasone , Patient/Family education, Balance training, Stair training, Taping, Dry Needling, Joint mobilization, Joint manipulation, Spinal manipulation, Spinal mobilization, Scar mobilization, DME instructions, Wheelchair mobility training, Cryotherapy, Moist heat, and Biofeedback  NEXT VISIT: for aquatic PT, Work on ADDuction, core and LE strengthening and neuromuscular re-ed improved gait and mobility LAND: continue with gait training with cane and walker, sit to stands, core and LE strength    Burnard Joy, PT 07/28/24 8:41 AM   [facility] [address], [address] [place],  [place] [address] Phone # [phone] Fax [phone]

## 2024-07-31 ENCOUNTER — Other Ambulatory Visit: Payer: Self-pay | Admitting: Family Medicine

## 2024-07-31 ENCOUNTER — Encounter: Payer: Self-pay | Admitting: Neurology

## 2024-07-31 ENCOUNTER — Ambulatory Visit: Admitting: Rehabilitative and Restorative Service Providers"

## 2024-07-31 ENCOUNTER — Encounter: Payer: Self-pay | Admitting: Rehabilitative and Restorative Service Providers"

## 2024-07-31 DIAGNOSIS — R293 Abnormal posture: Secondary | ICD-10-CM

## 2024-07-31 DIAGNOSIS — M6281 Muscle weakness (generalized): Secondary | ICD-10-CM

## 2024-07-31 DIAGNOSIS — F411 Generalized anxiety disorder: Secondary | ICD-10-CM

## 2024-07-31 DIAGNOSIS — R269 Unspecified abnormalities of gait and mobility: Secondary | ICD-10-CM

## 2024-07-31 NOTE — Therapy (Signed)
 OUTPATIENT PHYSICAL THERAPY TREATMENT NOTE   Patient Name: Anna Ramsey MRN: [id number] DOB:1966-09-13, 58 y.o., female Today's Date: 07/31/2024  END OF SESSION:  PT End of Session - 07/31/24 0809     Visit Number 42    Date for Recertification  10/10/24    Authorization Type [employer]    PT Start Time 0802    PT Stop Time 0840    PT Time Calculation (min) 38 min    Activity Tolerance Patient tolerated treatment well    Behavior During Therapy [facility] for tasks assessed/performed                      Past Medical History:  Diagnosis Date   ADHD (attention deficit hyperactivity disorder) 2024   Dysuria    Erythematous bladder mucosa    Heart murmur    asymptomatic per pt --  no echo   Hematuria    History of cervical cancer    12/ 2014  Stage IB2--  s/p  TAH w/ BSO and pelvic lymphadectomy/  and radiation therapy   History of radiation therapy 10/20/13-11/28/13   pelvis 50.4 gray   Hypertension    Lesion of bladder    Mild intermittent asthma    Neuromuscular disorder (HCC)    Seasonal allergies    Thyroid disease    Past Surgical History:  Procedure Laterality Date   CYSTOSCOPY WITH BIOPSY N/A 04/29/2015   Procedure: CYSTOSCOPY WITH BIOPSY WITH FULGERATION;  Surgeon: Belvie LITTIE Clara, MD;  Location: [facility];  Service: Urology;  Laterality: N/A;  1 HR [phone] [id number]   FOOT SURGERY Left 2010   RADICAL ABDOMINAL HYSTERECTOMY  09-05-2013   w/ BILATERAL SALPINGOOPHORECTOMY AND PELVIC LYMPHADECTOMY   Patient Active Problem List   Diagnosis Date Noted   ADD (attention deficit disorder) 04/21/2024   SUI (stress urinary incontinence, female) 11/01/2023   History of urinary retention 11/01/2023   Neurogenic bladder 10/18/2023   Hematuria 10/18/2023   Acute urinary retention 10/18/2023   Stress and adjustment reaction 01/13/2021   Inattention 10/28/2019   Hypothyroid 11/13/2016   Neuropathy involving both lower extremities  11/13/2016   Cystitis, radiation 05/22/2014   Cervical cancer (HCC) 09/01/2013   Adenocarcinoma of cervix, 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   Asthma, mild intermittent 07/07/2009   INSOMNIA 05/28/2008   Generalized anxiety disorder 05/07/2008   HYPERTENSION, BENIGN 05/31/2007    PCP: Geraline Dorothyann JONETTA, MD  REFERRING PROVIDER: Skeet Juliene SAUNDERS, DO   REFERRING DIAG:  [phone] (ICD-10-CM) - Bilateral leg weakness G62.89 (ICD-10-CM) - 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  Patient reports that she has been going out to get the mail.  Patient states that she had some nerve pain last night and took some Lyrica .    Eval: Right leg worse than left but both painful. Right has instances of knee just giving out, has neuropathy - like weakness, pain since radiation in 2015. The nerves have been affected and greatly limiting her activity. Does have symptoms of numbness, sensory deficits, weakness, ataxic gait, and painful but all vary based on the day.     Fluid intake: Yes: water    PAIN: 07/31/24 Are you having pain? Leg/foot feels heavy/tingly NPRS scale: 3/10 Pain location: bil legs Right leg  Pain type: aching Pain description: constant   Aggravating factors: walking, end of day, rainy days Relieving factors: try not to walk, decreasing activities    PRECAUTIONS: Other: cervical cancer with radiation  RED FLAGS: None   WEIGHT BEARING  RESTRICTIONS: No  FALLS:  Has patient fallen in last 6 months? Yes. Number of falls 1x/weekly due to weakness and decreased balance from having the radiation, trouble with low light - reports she does need to furniture walk to help with balance  LIVING ENVIRONMENT: Lives with: lives with their family  OCCUPATION: sits with laptop on her lap for 12 hours per day; unable to sit with legs down due to them falling asleep  PLOF: Independent  PATIENT GOALS: to strengthening enough to walk across grass, wants to have better balance, stronger legs                                        PERTINENT HISTORY:  ADHD; Cervical Cancer 2014; 9 weeks of radiation; Radical hysterectomy;    OBJECTIVE:  Note: Objective measures were completed at Evaluation unless otherwise noted.  DIAGNOSTIC FINDINGS:   PATIENT SURVEYS:  Lower Extremity Functional Score: 5 / 80 = 6.3 % 06/16/24 LEFS 28 / 80 = 35.0 % 07/15/24  19 / 80 = 23.8 %  COGNITION: Overall cognitive status: Within functional limits for tasks assessed     SENSATION: Poor sensation at Rt LE - light touch felt along posterior gastroc only no awareness without visualization of being touched at medial, lateral, anterior lower leg, reports numbness in bil feet Rt>Lt  EDEMA:  Rt ankle sometimes swells per pt  MUSCLE LENGTH: Bil hamstrings and adductors limited by 25%;  POSTURE: rounded shoulders and forward head  LOWER EXTREMITY ROM:  weakness and poor coordination/proprioception   LOWER EXTREMITY MMT:  02/04/24 hip flexion was R 3/5, L 4/5  MMT Right Eval */5 Left Eval */5 Right 5/16 Left 5/16 RIGHT 6/6 LEFT  6/6 Right 10/21 Left  10/21  Hip flexion 2 3 4+ 5 3  3  4+  Hip extension 2 3 4+ 4 4+ 4 3+ 4  Hip abduction 3 3+ 5- 5 5     Hip adduction 1+ 2+ 2+ 2+ 2+ 2+ 2+ 2+  Hip internal rotation          Hip external rotation          Knee flexion 2 3 3- 4 3+ 5 2+ 4  Knee extension 2+ 3 3- 5 3+     Ankle dorsiflexion 0 4 0 4+  long sitting 2 4  4+ long sitting 2  Ankle plantarflexion 3 4        Ankle inversion 1 4 3+ tested in long sitting 4+ tested in long sitting 3+ in long sitting 5 in long sitting    Ankle eversion 2 4 3+ tested in long sitting 4- tested in long sitting 2+ 4- tested in long sitting     (Blank rows = not tested)   FUNCTIONAL TESTS:  Five times Sit to Stand Test (FTSS) Method: Use a straight back chair with a solid seat that is 16-18" high. Ask participant to sit on the chair with arms folded across their chest.   Instructions: "Stand up and sit down as quickly as possible 5 times, keeping your arms folded across your chest."   Measurement: Stop timing when the participant stands the 5th time.  TIME: ______ (in seconds)  Times > 13.6 seconds is associated with increased disability and morbidity (Guralnik, 2000) Times > 15 seconds is predictive of recurrent falls in healthy individuals aged 58 and older (Buatois, et al., 2008) Normal performance values in community dwelling individuals aged 70 and older (Bohannon, 2006): 50-59 years: 8 seconds 60-69 years: 11.4 seconds 70-79 years: 12.6 seconds 80-89 years: 14.8 seconds  MCID: >= 2.3 seconds for Vestibular Disorders (Meretta, 2006)  5 times sit to stand: 22s with hands - very unstable with poor hip, posture, core control  Timed up and go (TUG): 28s with SBQC - min A   07/15/24 37.10 sec (one stand with no UE support, 4 with B UE support and last two with min A upon standing)   GAIT: Distance walked: 200' Assistive device utilized: Quad cane small base Level of assistance: CGA Comments: Rt LE demonstrating ataxic pattern, Lt trunk lean intermittently  to right self, poor step height bil, decreased stride length, limited glute activation    06/16/24 266 ft with SPC, improved step height and step length, intermittent LOB if pt turns to speak to therapist, but pt able to right herself independently. Minimal trunk lean.                                                                                                                                TREATMENT DATE:   07/31/2024: Nustep level 4 (blue machine) x6 min with PT present to discuss status Side stepping at barre down and back 3 laps Standing at barre performing unilateral foot tap over small hurdle and back x10 bilat Standing shoulder rows with yellow tband 2x10 Standing shoulder extension with yellow tband 2x10 Standing unilateral row with twist with yellow tband x10 bilat Seated with foot step up and over and back on small cone x10 bilat (difficulty with right LE)   07/28/2024: Nustep level 5 (green machine) x6 min with PT present to discuss status Side stepping at barre down and back 3 laps Standing at barre performing unilateral foot tap over small hurdle and back x10 bilat Standing shoulder rows with yellow tband 2x10 Standing  shoulder extension with yellow tband 2x10 Standing unilateral row with twist with yellow tband x10 bilat Seated with foot step up and over and back on small cone x5 (pt with difficulty with left LE) Standing with foot tapping laterally to each side of body x10 bilat, diagonal reverse tap, diagonal forward tap x10 each   07/23/2024: Nustep level 5 (green machine) x6 min with PT present to discuss status Side stepping at barre down and back 3 laps Standing at barre performing unilateral foot tap over small hurdle and back x10 bilat Standing shoulder rows with yellow tband 2x10 Standing shoulder extension with yellow tband 2x10 Standing unilateral row with twist with yellow tband x10 bilat Seated with foot step up and over and back on small cone x5 (pt with difficulty with left LE) Standing with foot tapping laterally to each side of body x10 bilat    PATIENT EDUCATION:  Education details: [email or web address] - uses App Person educated: Patient Education method: Explanation, Demonstration, Tactile cues, Verbal cues, and  Handouts Education comprehension: verbalized understanding, returned demonstration, verbal cues required, tactile cues required, and needs further education  HOME EXERCISE PROGRAM: Access Code: [email or web address] URL: [email or web address] Date: 07/07/2024 Prepared by: Mliss  Exercises - Supine Heel Slide with Strap  - 1 x daily - 7 x weekly - 3 sets - 10 reps - Pillow squeezes with kegel  - 1 x daily - 7 x weekly - 3 sets - 10 reps - Sit to Stand with Counter Support  - 1 x daily - 7 x weekly - 1 sets - 10 reps - Seated Long Arc Quad  - 1 x daily - 7 x weekly - 1-3 sets - 10 reps - 5 sec hold - Seated Isometric Hip Abduction with Resistance  - 1 x daily - 7 x weekly - 1-3 sets - 10 reps - Heel Raises with Counter Support  - 1 x daily - 7 x weekly - 1-3 sets - 10 reps - Standing Terminal Knee Extension at Wall with Ball  - 2 x daily - 7 x weekly - 2 sets - 10 reps - 5 sec hold - Squat with Chair Touch and Resistance Loop  - 1 x daily - 3 x weekly - 2 sets - 10 reps - Standing Knee Flexion with Counter Support  - 1 x daily - 3 x weekly - 2 sets - 10 reps - Supine Bridge with Resistance Band  - 1 x daily - 7 x weekly - 1-2 sets - 10 reps - 5 sec hold - Standing Hip Abduction on Slider  - 1 x daily - 3 x weekly - 2 sets - 10 reps - Calf stretch on rocker board  - 1 x daily - 7 x weekly - 1 sets - 3 reps - 30 sec  hold - Standing Anti-Rotation Press with Anchored Resistance  - 1 x daily - 3 x weekly - 2 sets - 10 reps - Standing Trunk Rotation with Resistance  - 1 x daily - 3 x weekly - 2 sets - 10 reps - Right Modified Dix Hallpike with Pillows  - 1 x daily - 1 x weekly - 1 sets - 1 reps - 30 sec hold  ASSESSMENT:  CLINICAL IMPRESSION:   Karna presents to skilled PT reporting that she had to take Lyrica  last night due to increased nerve pain.  Patient with some difficulty with standing balance today, especially with the rows with the twist and had to hold onto a chair  for balance.   Patient continues with great motivation throughout.  Patient with more difficulty today with coordination of right LE.  Patient continues to require skilled PT to progress towards goal related activities.     Eval: Patient is a 58 y.o. female  who was seen today for physical therapy evaluation and tr in the teatment for poor gait mechanics, impaired posture, impaired balance, decreased hip and core strength, increased fall risk. Pt has complex history of  Radiation-induced lumbosacral plexopathy and Polyradiculoneuropathy status post cervical cancer. Pt has completed pelvic floor PT recently and had positive outcomes with this and resolution of symptoms. Pt very motivated to participate in PT for bil LE strengthening and decreased fall risk. Pt demonstrated impairments with objective measurements of TUG and 5xSTS indicative of increased fall risk and balance deficits. Pt reports she is unable to hold and carry grandchildren, to walk on uneven surfaces, needs assistance with dressing, walking, transfers sometimes and wants to be more I. Pt would benefit from additional PT to further address deficits.    OBJECTIVE IMPAIRMENTS: Abnormal gait, decreased activity tolerance, decreased coordination, decreased endurance, decreased mobility, difficulty walking, decreased strength, increased fascial restrictions, increased muscle spasms, impaired flexibility, impaired sensation, improper body mechanics, postural dysfunction, and pain.   ACTIVITY LIMITATIONS: carrying, lifting, bending, sitting, standing, squatting, stairs, transfers, bed mobility, locomotion level, and caring for others  PARTICIPATION LIMITATIONS: meal prep, cleaning, laundry, interpersonal relationship, driving, shopping, community activity, occupation, and yard work  PERSONAL FACTORS: Fitness, Past/current experiences, Time since onset of injury/illness/exacerbation, and 1 comorbidity: medical history are also affecting patient's functional  outcome.   REHAB POTENTIAL: Good  CLINICAL DECISION MAKING: Evolving/moderate complexity  EVALUATION COMPLEXITY: Moderate   GOALS: Goals reviewed with patient? Yes  SHORT TERM GOALS: Target date: 02/13/24 Pt to be I with HEP for carry over and continuing recommendations for improved outcomes.   Baseline: Goal status: MET  2.  Pt to demonstrate at least 3/5 Rt LE strength grossly and 4/5 Lt for improved pelvic stability and functional squats without falling.  Baseline:  Goal status: PARTIALLY MET 03/03/24  3.  Pt to demonstrate ability to complete static standing without AD for at least 5 mins at midline to complete gentle kitchen tasks/household tasks without fear of falling.  Baseline:  Goal status: MET  4.  Pt to demonstrate I with dynamic sitting balance to better be able to reach floor level for retrieving fallen objects and putting on socks without falling Baseline:  Goal status: MET 03/03/24  LONG TERM GOALS: Target date: 10/10/24  Pt to be I with advanced HEP for carry over and continuing recommendations for improved outcomes.   Baseline:  Goal status: ongoing  2.  Pt to report improved LEFS score to at least 30/80 for improved I with functional tasks at home such as dressing, walking from room to room.  Baseline:  Goal status: IN PROGRESS 28/80 06/16/24; 07/15/24  19 / 80 = 23.8 %  3.  Pt to demonstrate improved dynamic standing balance with or without AD at no more than CGA to better be able to play with grandchildren without falling.  Baseline:  Goal status: DEFERRED  4.  Pt to demonstrate improved gait mechanics with LRAD for 50 ' with minimal deviation from straight path to allow safe ambulation at home. Baseline:  Goal status: MODIFIED 07/15/24    5.  Pt to demonstrate ability to perform x5 Sit to stand with at least 10# to more safely assist in caring for  grandchildren without risk of falling.  Baseline:  Goal status: DEFERRED  6.  Pt able to perform 3-5 sit  to stands without UE assist showing improved functional LE strength Baseline: 1 in 30 sec on 07/15/24 Goal status: NEW  7.  Patient to report no falls when using AD at home or in the community. Baseline: using rolling walker at home and community (07/28/24) Goal status: Ongoing      PLAN:  PT FREQUENCY: 2x/week  PT DURATION: 12 weeks  PLANNED INTERVENTIONS:    97110-Therapeutic exercises, 97530- Therapeutic activity, 97112- Neuromuscular re-education, 97535- Self Care, 02859- Manual therapy, [phone]- Gait training, [phone]- Canalith repositioning, [id number]- Aquatic Therapy, [phone]- Splinting, [id number]- Electrical stimulation (manual), [id number]- Vasopneumatic device, [id number]- Ultrasound, [id number]- Traction (mechanical), [id number]- Ionotophoresis 4mg /ml Dexamethasone , Patient/Family education, Balance training, Stair training, Taping, Dry Needling, Joint mobilization, Joint manipulation, Spinal manipulation, Spinal mobilization, Scar mobilization, DME instructions, Wheelchair mobility training, Cryotherapy, Moist heat, and Biofeedback  NEXT VISIT: for aquatic PT, Work on ADDuction, core and LE strengthening and neuromuscular re-ed improved gait and mobility LAND: continue with gait training with cane and walker, sit to stands, core and LE strength    Jarrell Laming, PT, DPT 07/31/24, 8:45 AM  [facility] [address], [address] [place], [place] [address] Phone # [phone] Fax [phone]

## 2024-08-04 ENCOUNTER — Encounter: Payer: Self-pay | Admitting: Rehabilitative and Restorative Service Providers"

## 2024-08-04 ENCOUNTER — Ambulatory Visit: Admitting: Rehabilitative and Restorative Service Providers"

## 2024-08-04 DIAGNOSIS — R269 Unspecified abnormalities of gait and mobility: Secondary | ICD-10-CM

## 2024-08-04 DIAGNOSIS — M6281 Muscle weakness (generalized): Secondary | ICD-10-CM

## 2024-08-04 DIAGNOSIS — R293 Abnormal posture: Secondary | ICD-10-CM

## 2024-08-04 NOTE — Therapy (Signed)
 OUTPATIENT PHYSICAL THERAPY TREATMENT NOTE   Patient Name: Anna Ramsey MRN: [id number] DOB:[birth year], 58 y.o., female Today's Date: 08/04/2024  END OF SESSION:  PT End of Session - 08/04/24 0806     Visit Number 43    Date for Recertification  10/10/24    Authorization Type [employer]    PT Start Time 0800    PT Stop Time 0840    PT Time Calculation (min) 40 min    Activity Tolerance Patient tolerated treatment well    Behavior During Therapy [facility] for tasks assessed/performed             Past Medical History:  Diagnosis Date   ADHD (attention deficit hyperactivity disorder) 2024   Dysuria    Erythematous bladder mucosa    Heart murmur    asymptomatic per pt --  no echo   Hematuria    History of cervical cancer    12/ 2014  Stage IB2--  s/p  TAH w/ BSO and pelvic lymphadectomy/  and radiation therapy   History of radiation therapy 10/20/13-11/28/13   pelvis 50.4 gray   Hypertension    Lesion of bladder    Mild intermittent asthma    Neuromuscular disorder (HCC)    Seasonal allergies    Thyroid disease    Past Surgical History:  Procedure Laterality Date   CYSTOSCOPY WITH BIOPSY N/A 04/29/2015   Procedure: CYSTOSCOPY WITH BIOPSY WITH FULGERATION;  Surgeon: Belvie LITTIE Clara, MD;  Location: [facility];  Service: Urology;  Laterality: N/A;  1 HR [phone] [id number]   FOOT SURGERY Left 2010   RADICAL ABDOMINAL HYSTERECTOMY  09-05-2013   w/ BILATERAL SALPINGOOPHORECTOMY AND PELVIC LYMPHADECTOMY   Patient Active Problem List   Diagnosis Date Noted   ADD (attention deficit disorder) 04/21/2024   SUI (stress urinary incontinence, female) 11/01/2023   History of urinary retention 11/01/2023   Neurogenic bladder 10/18/2023   Hematuria 10/18/2023   Acute urinary retention 10/18/2023   Stress and adjustment reaction 01/13/2021   Inattention 10/28/2019   Hypothyroid 11/13/2016   Neuropathy involving both lower extremities 11/13/2016    Cystitis, radiation 05/22/2014   Cervical cancer (HCC) 09/01/2013   Adenocarcinoma of cervix, 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   Asthma, mild intermittent 07/07/2009   INSOMNIA 05/28/2008   Generalized anxiety disorder 05/07/2008   HYPERTENSION, BENIGN 05/31/2007    PCP: Geraline Dorothyann JONETTA, MD  REFERRING PROVIDER: Skeet Juliene SAUNDERS, DO   REFERRING DIAG:  [phone] (ICD-10-CM) - Bilateral leg weakness G62.89 (ICD-10-CM) - 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    Eval: Right leg worse than left but both painful. Right has instances of knee just giving out, has neuropathy - like weakness, pain since radiation in 2015. The nerves have been affected and greatly limiting her activity. Does have symptoms of numbness, sensory deficits, weakness, ataxic gait, and painful but all vary based on the day.     Fluid intake: Yes: water    PAIN: 08/04/24 Are you having pain? Leg/foot feels heavy/tingly NPRS scale: 3/10 Pain location: bil legs Right leg  Pain type: aching Pain description: constant   Aggravating factors: walking, end of day, rainy days Relieving factors: try not to walk, decreasing activities    PRECAUTIONS: Other: cervical cancer with radiation  RED FLAGS: None   WEIGHT BEARING RESTRICTIONS: No  FALLS:  Has patient fallen in last 6 months? Yes. Number of falls 1x/weekly due to weakness and  decreased balance from having the radiation, trouble with low light - reports she does need to furniture walk to help with balance  LIVING ENVIRONMENT: Lives with: lives with their family  OCCUPATION: sits with laptop on her lap for 12 hours per day; unable to sit with legs down due to them falling asleep  PLOF: Independent  PATIENT GOALS: to strengthening enough to walk across grass, wants to have better balance, stronger legs                                        PERTINENT HISTORY:  ADHD; Cervical Cancer 2014; 9 weeks of radiation; Radical hysterectomy;    OBJECTIVE:  Note: Objective measures were completed at Evaluation unless otherwise noted.  DIAGNOSTIC FINDINGS:   PATIENT SURVEYS:  Lower Extremity Functional Score: 5 / 80 = 6.3 % 06/16/24 LEFS 28 / 80 = 35.0 % 07/15/24  19 / 80 = 23.8 %  COGNITION: Overall cognitive status: Within functional limits for tasks assessed     SENSATION: Poor sensation at Rt LE - light touch felt along posterior gastroc only no awareness without visualization of being touched at medial, lateral, anterior lower leg, reports numbness in bil feet Rt>Lt  EDEMA:  Rt ankle sometimes swells per pt  MUSCLE LENGTH: Bil hamstrings and adductors limited by 25%;  POSTURE: rounded shoulders and forward head  LOWER EXTREMITY ROM:  weakness and poor coordination/proprioception   LOWER EXTREMITY MMT:  02/04/24 hip flexion was R 3/5, L 4/5  MMT Right Eval */5 Left Eval */5 Right 5/16 Left 5/16 RIGHT 6/6 LEFT  6/6 Right 10/21 Left  10/21  Hip flexion 2 3 4+ 5 3  3  4+  Hip extension 2 3 4+ 4 4+ 4 3+ 4  Hip abduction 3 3+ 5- 5 5     Hip adduction 1+ 2+ 2+ 2+ 2+ 2+ 2+ 2+  Hip internal rotation          Hip external rotation          Knee flexion 2 3 3- 4 3+ 5 2+ 4  Knee extension 2+ 3 3- 5 3+     Ankle dorsiflexion 0 4 0 4+ long sitting 2 4  4+ long sitting 2      Ankle plantarflexion 3 4        Ankle inversion 1 4 3+ tested in long  sitting 4+  tested in long sitting 3+ in long sitting 5 in long sitting    Ankle eversion 2 4 3+ tested in long sitting 4- tested in long sitting 2+ 4- tested in long sitting     (Blank rows = not tested)   FUNCTIONAL TESTS:  Five times Sit to Stand Test (FTSS) Method: Use a straight back chair with a solid seat that is 16-18" high. Ask participant to sit on the chair with arms folded across their chest.   Instructions: "Stand up and sit down as quickly as possible 5 times, keeping your arms folded across your chest."   Measurement: Stop timing when the participant stands the 5th time.  TIME: ______ (in seconds)  Times > 13.6 seconds is associated with increased disability and morbidity (Guralnik, 2000) Times > 15 seconds is predictive of recurrent falls in healthy individuals aged 34 and older (Buatois, et al., 2008) Normal performance values in community dwelling individuals aged 59 and older (Bohannon, 2006): 50-59 years: 8 seconds 60-69 years: 11.4 seconds 70-79 years: 12.6 seconds 80-89 years: 14.8 seconds  MCID: >= 2.3 seconds for Vestibular Disorders (Meretta, 2006)  5 times sit to stand: 22s with hands - very unstable with poor hip, posture, core control  Timed up and go (TUG): 28s with SBQC - min A   07/15/24 37.10 sec (one stand with no UE support, 4 with B UE support and last two with min A upon standing)   GAIT: Distance walked: 200' Assistive device utilized: Quad cane small base Level of assistance: CGA Comments: Rt LE demonstrating ataxic pattern, Lt trunk lean intermittently  to right self, poor step height bil, decreased stride length, limited glute activation    06/16/24 266 ft with SPC, improved step height and step length, intermittent LOB if pt turns to speak to therapist, but pt able to right herself independently. Minimal trunk lean.                                                                                                                                TREATMENT DATE:   08/04/2024: Nustep level 5 (green machine) x6 min with PT present to discuss status Side stepping at barre down and back 3 laps Standing at barre performing unilateral foot tap over small hurdle and back x10 bilat Standing shoulder rows with red tband 2x10 Standing shoulder extension with red tband 2x10 Standing unilateral row with twist with red tband x10 bilat (with unilateral support with one hand on support bar) Seated with foot step up and over and back on small cone x10 bilat (difficulty with right LE)   07/31/2024: Nustep level 4 (blue machine) x6 min with PT present to discuss status Side stepping at barre down and back 3 laps Standing at barre performing unilateral foot tap over small hurdle and back x10 bilat Standing shoulder rows with yellow tband 2x10 Standing shoulder extension with yellow tband 2x10 Standing unilateral row with twist with  yellow tband x10 bilat Seated with foot step up and over and back on small cone x10 bilat (difficulty with right LE) Standing toe tap to 2 dots on floor with light unilateral UE support x10 bilat   07/28/2024: Nustep level 5 (green machine) x6 min with PT present to discuss status Side stepping at barre down and back 3 laps Standing at barre performing unilateral foot tap over small hurdle and back x10 bilat Standing shoulder rows with yellow tband 2x10 Standing shoulder extension with yellow tband 2x10 Standing unilateral row with twist with yellow tband x10 bilat Seated with foot step up and over and back on small cone x5 (pt with difficulty with left LE) Standing with foot tapping laterally to each side of body x10 bilat, diagonal reverse tap, diagonal forward tap x10 each     PATIENT EDUCATION:  Education details: [email or web address] - uses App Person educated: Patient Education method: Explanation, Demonstration, Tactile cues, Verbal cues, and Handouts Education comprehension: verbalized understanding, returned  demonstration, verbal cues required, tactile cues required, and needs further education  HOME EXERCISE PROGRAM: Access Code: [email or web address] URL: [email or web address] Date: 07/07/2024 Prepared by: Mliss  Exercises - Supine Heel Slide with Strap  - 1 x daily - 7 x weekly - 3 sets - 10 reps - Pillow squeezes with kegel  - 1 x daily - 7 x weekly - 3 sets - 10 reps - Sit to Stand with Counter Support  - 1 x daily - 7 x weekly - 1 sets - 10 reps - Seated Long Arc Quad  - 1 x daily - 7 x weekly - 1-3 sets - 10 reps - 5 sec hold - Seated Isometric Hip Abduction with Resistance  - 1 x daily - 7 x weekly - 1-3 sets - 10 reps - Heel Raises with Counter Support  - 1 x daily - 7 x weekly - 1-3 sets - 10 reps - Standing Terminal Knee Extension at Wall with Ball  - 2 x daily - 7 x weekly - 2 sets - 10 reps - 5 sec hold - Squat with Chair Touch and Resistance Loop  - 1 x daily - 3 x weekly - 2 sets - 10 reps - Standing Knee Flexion with Counter Support  - 1 x daily - 3 x weekly - 2 sets - 10 reps - Supine Bridge with Resistance Band  - 1 x daily - 7 x weekly - 1-2 sets - 10 reps - 5 sec hold - Standing Hip Abduction on Slider  - 1 x daily - 3 x weekly - 2 sets - 10 reps - Calf stretch on rocker board  - 1 x daily - 7 x weekly - 1 sets - 3 reps - 30 sec  hold - Standing Anti-Rotation Press with Anchored Resistance  - 1 x daily - 3 x weekly - 2 sets - 10 reps - Standing Trunk Rotation with Resistance  - 1 x daily - 3 x weekly - 2 sets - 10 reps - Right Modified Dix Hallpike with Pillows  - 1 x daily - 1 x weekly - 1 sets - 1 reps - 30 sec hold  ASSESSMENT:  CLINICAL IMPRESSION:   Karna presents to skilled PT reporting no new complaints or falls.  Patient continues to use 4WRW.  Patient continues to progress with standing tolerance and balance exercises during session.  Patient able to progress from yellow to red theraband without any reports of increased pain.  Patient utilized stabilization bar  for standing unilateral rows with rotation for improved balance.  Patient continues to progress with coordination of muscles and ability to lift LE over an object.  Patient is scheduled for aquatic PT next session to continue to progress coordination, balance, and strengthening.       Eval: Patient is a 58 y.o. female  who was seen today for physical therapy evaluation and tr in the teatment for poor gait mechanics, impaired posture, impaired balance, decreased hip and core strength, increased fall risk. Pt has complex history of  Radiation-induced lumbosacral plexopathy and Polyradiculoneuropathy status post cervical cancer. Pt has completed pelvic floor PT recently and had positive outcomes with this and resolution of symptoms. Pt very motivated to participate in PT for bil LE strengthening and decreased fall risk. Pt demonstrated impairments with objective measurements of TUG and 5xSTS indicative of increased fall risk and balance deficits. Pt reports she is unable to hold and carry grandchildren, to walk on uneven surfaces, needs assistance with dressing, walking, transfers sometimes and wants to be more I. Pt would benefit from additional PT to further address deficits.    OBJECTIVE IMPAIRMENTS: Abnormal gait, decreased activity tolerance, decreased coordination, decreased endurance, decreased mobility, difficulty walking, decreased strength, increased fascial restrictions, increased muscle spasms, impaired flexibility, impaired sensation, improper body mechanics, postural dysfunction, and pain.   ACTIVITY LIMITATIONS: carrying, lifting, bending, sitting, standing, squatting, stairs, transfers, bed mobility, locomotion level, and caring for others  PARTICIPATION LIMITATIONS: meal prep, cleaning, laundry, interpersonal relationship, driving, shopping, community activity, occupation, and yard work  PERSONAL FACTORS: Fitness, Past/current experiences, Time since onset of injury/illness/exacerbation, and  1 comorbidity: medical history are also affecting patient's functional outcome.   REHAB POTENTIAL: Good  CLINICAL DECISION MAKING: Evolving/moderate complexity  EVALUATION COMPLEXITY: Moderate   GOALS: Goals reviewed with patient? Yes  SHORT TERM GOALS: Target date: 02/13/24 Pt to be I with HEP for carry over and continuing recommendations for improved outcomes.   Baseline: Goal status: MET  2.  Pt to demonstrate at least 3/5 Rt LE strength grossly and 4/5 Lt for improved pelvic stability and functional squats without falling.  Baseline:  Goal status: PARTIALLY MET 03/03/24  3.  Pt to demonstrate ability to complete static standing without AD for at least 5 mins at midline to complete gentle kitchen tasks/household tasks without fear of falling.  Baseline:  Goal status: MET  4.  Pt to demonstrate I with dynamic sitting balance to better be able to reach floor level for retrieving fallen objects and putting on socks without falling Baseline:  Goal status: MET 03/03/24  LONG TERM GOALS: Target date: 10/10/24  Pt to be I with advanced HEP for carry over and continuing recommendations for improved outcomes.   Baseline:  Goal status: ongoing  2.  Pt to report improved LEFS score to at least 30/80 for improved I with functional tasks at home such as dressing, walking from room to room.  Baseline:  Goal status: IN PROGRESS 28/80 06/16/24; 07/15/24  19 / 80 = 23.8 %  3.  Pt to demonstrate improved dynamic standing balance with or without AD at no more than CGA to better be able to play with grandchildren without falling.  Baseline:  Goal status: DEFERRED  4.  Pt to demonstrate improved gait mechanics with LRAD for 50 ' with minimal deviation from straight path to allow safe ambulation at home. Baseline:  Goal status: MODIFIED 07/15/24    5.  Pt to demonstrate ability to perform x5 Sit to stand  with at least 10# to more safely assist in caring for grandchildren without risk of falling.   Baseline:  Goal status: DEFERRED  6.  Pt able to perform 3-5 sit to stands without UE assist showing improved functional LE strength Baseline: 1 in 30 sec on 07/15/24 Goal status: NEW  7.  Patient to report no falls when using AD at home or in the community. Baseline: using rolling walker at home and community (07/28/24) Goal status: Ongoing      PLAN:  PT FREQUENCY: 2x/week  PT DURATION: 12 weeks  PLANNED INTERVENTIONS:    97110-Therapeutic exercises, 97530- Therapeutic activity, 97112- Neuromuscular re-education, 97535- Self Care, 02859- Manual therapy, [phone]- Gait training, [phone]- Canalith repositioning, [id number]- Aquatic Therapy, [id number]- Splinting, [id number]- Electrical stimulation (manual), [id number]- Vasopneumatic device, [id number]- Ultrasound, [id number]- Traction (mechanical), [id number]- Ionotophoresis 4mg /ml Dexamethasone , Patient/Family education, Balance training, Stair training, Taping, Dry Needling, Joint mobilization, Joint manipulation, Spinal manipulation, Spinal mobilization, Scar mobilization, DME instructions, Wheelchair mobility training, Cryotherapy, Moist heat, and Biofeedback  NEXT VISIT: for aquatic PT, Work on ADDuction, core and LE strengthening and neuromuscular re-ed improved gait and mobility LAND: continue with gait training with cane and walker, sit to stands, core and LE strength    Jarrell Laming, PT, DPT 08/04/24, 9:27 AM  [facility] [address] [place], [place] [address] Phone # [phone] Fax [phone]

## 2024-08-06 ENCOUNTER — Ambulatory Visit: Admitting: Physical Therapy

## 2024-08-08 ENCOUNTER — Ambulatory Visit

## 2024-08-08 DIAGNOSIS — M6281 Muscle weakness (generalized): Secondary | ICD-10-CM

## 2024-08-08 DIAGNOSIS — R269 Unspecified abnormalities of gait and mobility: Secondary | ICD-10-CM

## 2024-08-08 DIAGNOSIS — R293 Abnormal posture: Secondary | ICD-10-CM

## 2024-08-08 NOTE — Therapy (Signed)
 OUTPATIENT PHYSICAL THERAPY TREATMENT NOTE   Patient Name: Anna Ramsey MRN: [id number] DOB:10-Dec-1965, 58 y.o., female Today's Date: 08/08/2024  END OF SESSION:  PT End of Session - 08/08/24 0808     Visit Number 44    Date for Recertification  10/10/24    Authorization Type [employer]    PT Start Time 0802    PT Stop Time 0847    PT Time Calculation (min) 45 min    Activity Tolerance Patient tolerated treatment well    Behavior During Therapy [facility] for tasks assessed/performed             Past Medical History:  Diagnosis Date   ADHD (attention deficit hyperactivity disorder) 2024   Dysuria    Erythematous bladder mucosa    Heart murmur    asymptomatic per pt --  no echo   Hematuria    History of cervical cancer    12/ 2014  Stage IB2--  s/p  TAH w/ BSO and pelvic lymphadectomy/  and radiation therapy   History of radiation therapy 10/20/13-11/28/13   pelvis 50.4 gray   Hypertension    Lesion of bladder    Mild intermittent asthma    Neuromuscular disorder (HCC)    Seasonal allergies    Thyroid disease    Past Surgical History:  Procedure Laterality Date   CYSTOSCOPY WITH BIOPSY N/A 04/29/2015   Procedure: CYSTOSCOPY WITH BIOPSY WITH FULGERATION;  Surgeon: Belvie LITTIE Clara, MD;  Location: [facility];  Service: Urology;  Laterality: N/A;  1 HR [phone] [id number]   FOOT SURGERY Left 2010   RADICAL ABDOMINAL HYSTERECTOMY  09-05-2013   w/ BILATERAL SALPINGOOPHORECTOMY AND PELVIC LYMPHADECTOMY   Patient Active Problem List   Diagnosis Date Noted   ADD (attention deficit disorder) 04/21/2024   SUI (stress urinary incontinence, female) 11/01/2023   History of urinary retention 11/01/2023   Neurogenic bladder 10/18/2023   Hematuria 10/18/2023   Acute urinary retention 10/18/2023   Stress and adjustment reaction 01/13/2021   Inattention 10/28/2019   Hypothyroid 11/13/2016   Neuropathy involving both lower extremities 11/13/2016    Cystitis, radiation 05/22/2014   Cervical cancer (HCC) 09/01/2013   Adenocarcinoma of cervix, 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   Asthma, mild intermittent 07/07/2009   INSOMNIA 05/28/2008   Generalized anxiety disorder 05/07/2008   HYPERTENSION, BENIGN 05/31/2007    PCP: Geraline Dorothyann JONETTA, MD  REFERRING PROVIDER: Skeet Juliene SAUNDERS, DO   REFERRING DIAG:  [id number] (ICD-10-CM) - Bilateral leg weakness G62.89 (ICD-10-CM) - 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 So I contacted Dr. Skeet and he is going to see me Dec 5. I'm getting around really well with the rollator and am just happy to be walking. My biggest issue right now is that anytime I'm standing my legs are go numb.     Eval: Right leg worse than left but both painful. Right has instances of knee just giving out, has neuropathy - like weakness, pain since radiation in 2015. The nerves have been affected and greatly limiting her activity. Does have symptoms of numbness, sensory deficits, weakness, ataxic gait, and painful but all vary based on the day.     Fluid intake: Yes: water    PAIN: 08/04/24 Are you having pain? Leg/foot feels heavy/tingly NPRS scale: 0/10, just numb and tingly Pain location: bil legs Right leg Pain type: aching Pain description: constant   Aggravating factors: walking, end of day, rainy days Relieving factors: try not to  walk, decreasing activities    PRECAUTIONS: Other: cervical cancer with radiation  RED FLAGS: None   WEIGHT BEARING RESTRICTIONS: No  FALLS:  Has patient fallen in last 6 months? Yes. Number of falls 1x/weekly due to weakness and decreased balance from having the radiation, trouble with low light - reports she does need to furniture walk to help with balance  LIVING ENVIRONMENT: Lives with: lives with their family  OCCUPATION: sits with laptop on her lap for 12 hours per day; unable to sit with legs down due to them falling asleep  PLOF: Independent  PATIENT GOALS: to strengthening enough to walk across grass, wants to have better balance, stronger legs                                        PERTINENT HISTORY:  ADHD; Cervical Cancer 2014; 9 weeks of radiation; Radical hysterectomy;    OBJECTIVE:  Note: Objective measures were completed at Evaluation unless otherwise noted.  DIAGNOSTIC FINDINGS:   PATIENT SURVEYS:  Lower Extremity Functional Score: 5 / 80 = 6.3 % 06/16/24 LEFS 28 / 80 = 35.0 % 07/15/24  19 / 80 = 23.8 %  COGNITION: Overall cognitive status: Within functional limits for tasks assessed     SENSATION: Poor sensation at Rt LE - light touch felt along posterior gastroc only no awareness without visualization of being touched at medial, lateral, anterior lower leg, reports numbness in bil feet Rt>Lt  EDEMA:  Rt ankle sometimes swells per pt  MUSCLE LENGTH: Bil hamstrings and adductors limited by 25%;  POSTURE: rounded shoulders and forward head  LOWER EXTREMITY ROM:  weakness and poor coordination/proprioception   LOWER EXTREMITY MMT:  02/04/24 hip flexion was R 3/5, L 4/5  MMT Right Eval */5 Left Eval */5 Right 5/16 Left 5/16 RIGHT 6/6 LEFT  6/6 Right 10/21 Left  10/21  Hip flexion 2 3 4+ 5 3  3  4+  Hip extension 2 3 4+ 4 4+ 4 3+ 4  Hip abduction 3 3+ 5- 5 5     Hip adduction 1+ 2+ 2+ 2+ 2+ 2+ 2+ 2+  Hip internal rotation          Hip  external rotation          Knee flexion 2 3 3- 4 3+ 5 2+ 4  Knee extension 2+ 3 3- 5 3+  Ankle dorsiflexion 0 4 0 4+ long sitting 2 4  4+ long sitting 2      Ankle plantarflexion 3 4        Ankle inversion 1 4 3+ tested in long sitting 4+ tested in long sitting 3+ in long sitting 5 in long sitting    Ankle eversion 2 4 3+ tested in long sitting 4- tested in long sitting 2+ 4- tested in long sitting     (Blank rows = not tested)   FUNCTIONAL TESTS:  Five times Sit to Stand Test (FTSS) Method: Use a straight back chair with a solid seat that is 16-18" high. Ask participant to sit on the chair with arms folded across their chest.   Instructions: "Stand up and sit down as quickly as possible 5 times, keeping your arms folded across your chest."   Measurement: Stop timing when the participant stands the 5th time.  TIME: ______ (in seconds)  Times > 13.6 seconds is associated with increased disability and morbidity (Guralnik, 2000) Times > 15 seconds is predictive of recurrent falls in healthy individuals aged 40 and older (Buatois, et al., 2008) Normal performance values in community dwelling individuals aged 63 and older (Bohannon, 2006): 50-59 years: 8 seconds 60-69 years: 11.4 seconds 70-79 years: 12.6 seconds 80-89 years: 14.8 seconds  MCID: >= 2.3 seconds for Vestibular Disorders (Meretta, 2006)  5 times sit to stand: 22s with hands - very unstable with poor hip, posture, core control  Timed up and go (TUG): 28s with SBQC - min A   07/15/24 37.10 sec (one stand with no UE support, 4 with B UE support and last two with min A upon standing)   GAIT: Distance walked: 200' Assistive device utilized: Quad cane small base Level of assistance: CGA Comments: Rt LE demonstrating ataxic pattern, Lt trunk lean intermittently  to right self, poor step height bil, decreased stride length, limited glute activation    06/16/24 266 ft with SPC, improved step height and step length,  intermittent LOB if pt turns to speak to therapist, but pt able to right herself independently. Minimal trunk lean.                                                                                                                               TREATMENT DATE:   08/08/24: Nustep level 5, LE 3/ UE 9 (green machine) x6 min with PTA present to discuss status In // bars for following: Front and retro heel-toe x 1 lap each with VC's to focus on foot placement as able; bil sidestepping x 1 rep each Stepping over orange obstacles x 2 laps, then bil sidestepping over obstacles x 1 each Bil HS stretch standing in // bars, then seated figure 4 piriformis stretch x 2 reps each, 20 sec each Free Motion Machine: Standing on purple balance pad for scap retract with 3# x 10, then bil UE ext x 10 with CBA-CGA throughout and  VC's for core and gluts engaged for stability; then D2 with cable (machine arm at 5) and 3# x 5 reps each returning demo and standing on floor with SBA, encouraged core engagement and full trunk rotation   Front and retro heel-toe walking on airex x 2 laps each in // bars encouraging fingertips as able   08/04/2024: Nustep level 5 (green machine) x6 min with PT present to discuss status Side stepping at barre down and back 3 laps Standing at barre performing unilateral foot tap over small hurdle and back x10 bilat Standing shoulder rows with red tband 2x10 Standing shoulder extension with red tband 2x10 Standing unilateral row with twist with red tband x10 bilat (with unilateral support with one hand on support bar) Seated with foot step up and over and back on small cone x10 bilat (difficulty with right LE)   07/31/2024: Nustep level 4 (blue machine) x6 min with PT present to discuss status Side stepping at barre down and back 3 laps Standing at barre performing unilateral foot tap over small hurdle and back x10 bilat Standing shoulder rows with yellow tband 2x10 Standing shoulder  extension with yellow tband 2x10 Standing unilateral row with twist with yellow tband x10 bilat Seated with foot step up and over and back on small cone x10 bilat (difficulty with right LE) Standing toe tap to 2 dots on floor with light unilateral UE support x10 bilat   07/28/2024: Nustep level 5 (green machine) x6 min with PT present to discuss status Side stepping at barre down and back 3 laps Standing at barre performing unilateral foot tap over small hurdle and back x10 bilat Standing shoulder rows with yellow tband 2x10 Standing shoulder extension with yellow tband 2x10 Standing unilateral row with twist with yellow tband x10 bilat Seated with foot step up and over and back on small cone x5 (pt with difficulty with left LE) Standing with foot tapping laterally to each side of body x10 bilat, diagonal reverse tap, diagonal forward tap x10 each     PATIENT EDUCATION:  Education details: [email or web address] - uses App Person educated: Patient Education method: Explanation, Demonstration, Tactile cues, Verbal cues, and Handouts Education comprehension: verbalized understanding, returned demonstration, verbal cues required, tactile cues required, and needs further education  HOME EXERCISE PROGRAM: Access Code: [email or web address] URL: [email or web address] Date: 07/07/2024 Prepared by: Mliss  Exercises - Supine Heel Slide with Strap  - 1 x daily - 7 x weekly - 3 sets - 10 reps - Pillow squeezes with kegel  - 1 x daily - 7 x weekly - 3 sets - 10 reps - Sit to Stand with Counter Support  - 1 x daily - 7 x weekly - 1 sets - 10 reps - Seated Long Arc Quad  - 1 x daily - 7 x weekly - 1-3 sets - 10 reps - 5 sec hold - Seated Isometric Hip Abduction with Resistance  - 1 x daily - 7 x weekly - 1-3 sets - 10 reps - Heel Raises with Counter Support  - 1 x daily - 7 x weekly - 1-3 sets - 10 reps - Standing Terminal Knee Extension at Wall with Ball  - 2 x daily - 7 x weekly - 2 sets - 10 reps - 5 sec  hold - Squat with Chair Touch and Resistance Loop  - 1 x daily - 3 x weekly - 2 sets - 10 reps - Standing Knee Flexion with Counter Support  - 1 x daily - 3  x weekly - 2 sets - 10 reps - Supine Bridge with Resistance Band  - 1 x daily - 7 x weekly - 1-2 sets - 10 reps - 5 sec hold - Standing Hip Abduction on Slider  - 1 x daily - 3 x weekly - 2 sets - 10 reps - Calf stretch on rocker board  - 1 x daily - 7 x weekly - 1 sets - 3 reps - 30 sec  hold - Standing Anti-Rotation Press with Anchored Resistance  - 1 x daily - 3 x weekly - 2 sets - 10 reps - Standing Trunk Rotation with Resistance  - 1 x daily - 3 x weekly - 2 sets - 10 reps - Right Modified Dix Hallpike with Pillows  - 1 x daily - 1 x weekly - 1 sets - 1 reps - 30 sec hold  ASSESSMENT:  CLINICAL IMPRESSION:   Today continued with focus on balance activities for stability and proprioception. Also continued with postural strength with stability. Pt repots feeling challenged by activities today but not overally fatigued. She has an appt with Dr. Skeet for new vision and upper body symptoms/changes noted.      Eval: Patient is a 57 y.o. female  who was seen today for physical therapy evaluation and tr in the teatment for poor gait mechanics, impaired posture, impaired balance, decreased hip and core strength, increased fall risk. Pt has complex history of  Radiation-induced lumbosacral plexopathy and Polyradiculoneuropathy status post cervical cancer. Pt has completed pelvic floor PT recently and had positive outcomes with this and resolution of symptoms. Pt very motivated to participate in PT for bil LE strengthening and decreased fall risk. Pt demonstrated impairments with objective measurements of TUG and 5xSTS indicative of increased fall risk and balance deficits. Pt reports she is unable to hold and carry grandchildren, to walk on uneven surfaces, needs assistance with dressing, walking, transfers sometimes and wants to be more I. Pt would  benefit from additional PT to further address deficits.    OBJECTIVE IMPAIRMENTS: Abnormal gait, decreased activity tolerance, decreased coordination, decreased endurance, decreased mobility, difficulty walking, decreased strength, increased fascial restrictions, increased muscle spasms, impaired flexibility, impaired sensation, improper body mechanics, postural dysfunction, and pain.   ACTIVITY LIMITATIONS: carrying, lifting, bending, sitting, standing, squatting, stairs, transfers, bed mobility, locomotion level, and caring for others  PARTICIPATION LIMITATIONS: meal prep, cleaning, laundry, interpersonal relationship, driving, shopping, community activity, occupation, and yard work  PERSONAL FACTORS: Fitness, Past/current experiences, Time since onset of injury/illness/exacerbation, and 1 comorbidity: medical history are also affecting patient's functional outcome.   REHAB POTENTIAL: Good  CLINICAL DECISION MAKING: Evolving/moderate complexity  EVALUATION COMPLEXITY: Moderate   GOALS: Goals reviewed with patient? Yes  SHORT TERM GOALS: Target date: 02/13/24 Pt to be I with HEP for carry over and continuing recommendations for improved outcomes.   Baseline: Goal status: MET  2.  Pt to demonstrate at least 3/5 Rt LE strength grossly and 4/5 Lt for improved pelvic stability and functional squats without falling.  Baseline:  Goal status: PARTIALLY MET 03/03/24  3.  Pt to demonstrate ability to complete static standing without AD for at least 5 mins at midline to complete gentle kitchen tasks/household tasks without fear of falling.  Baseline:  Goal status: MET  4.  Pt to demonstrate I with dynamic sitting balance to better be able to reach floor level for retrieving fallen objects and putting on socks without falling Baseline:  Goal status: MET 03/03/24  LONG TERM  GOALS: Target date: 10/10/24  Pt to be I with advanced HEP for carry over and continuing recommendations for improved  outcomes.   Baseline:  Goal status: ongoing  2.  Pt to report improved LEFS score to at least 30/80 for improved I with functional tasks at home such as dressing, walking from room to room.  Baseline:  Goal status: IN PROGRESS 28/80 06/16/24; 07/15/24  19 / 80 = 23.8 %  3.  Pt to demonstrate improved dynamic standing balance with or without AD at no more than CGA to better be able to play with grandchildren without falling.  Baseline:  Goal status: DEFERRED  4.  Pt to demonstrate improved gait mechanics with LRAD for 50 ' with minimal deviation from straight path to allow safe ambulation at home. Baseline:  Goal status: MODIFIED 07/15/24    5.  Pt to demonstrate ability to perform x5 Sit to stand with at least 10# to more safely assist in caring for grandchildren without risk of falling.  Baseline:  Goal status: DEFERRED  6.  Pt able to perform 3-5 sit to stands without UE assist showing improved functional LE strength Baseline: 1 in 30 sec on 07/15/24 Goal status: NEW  7.  Patient to report no falls when using AD at home or in the community. Baseline: using rolling walker at home and community (07/28/24) Goal status: Ongoing      PLAN:  PT FREQUENCY: 2x/week  PT DURATION: 12 weeks  PLANNED INTERVENTIONS:    97110-Therapeutic exercises, 97530- Therapeutic activity, 97112- Neuromuscular re-education, 97535- Self Care, 02859- Manual therapy, [phone]- Gait training, [phone]- Canalith repositioning, [id number]- Aquatic Therapy, [phone]- Splinting, [id number]- Electrical stimulation (manual), [id number]- Vasopneumatic device, [id number]- Ultrasound, [id number]- Traction (mechanical), [id number]- Ionotophoresis 4mg /ml Dexamethasone , Patient/Family education, Balance training, Stair training, Taping, Dry Needling, Joint mobilization, Joint manipulation, Spinal manipulation, Spinal mobilization, Scar mobilization, DME instructions, Wheelchair mobility training, Cryotherapy, Moist heat, and Biofeedback  NEXT VISIT: for  aquatic PT, Work on ADDuction, core and LE strengthening and neuromuscular re-ed improved gait and mobility LAND: continue with gait training with cane and walker, sit to stands, core and LE strength    Berwyn Knights, PTA 08/08/24 9:19 AM  [facility] [address], [address] [place], [place] [address] Phone # [phone] Fax [phone]

## 2024-08-12 ENCOUNTER — Other Ambulatory Visit: Payer: Self-pay | Admitting: Family Medicine

## 2024-08-12 DIAGNOSIS — R41840 Attention and concentration deficit: Secondary | ICD-10-CM

## 2024-08-13 ENCOUNTER — Encounter: Payer: Self-pay | Admitting: Physical Therapy

## 2024-08-13 ENCOUNTER — Ambulatory Visit: Admitting: Physical Therapy

## 2024-08-13 DIAGNOSIS — R293 Abnormal posture: Secondary | ICD-10-CM

## 2024-08-13 DIAGNOSIS — M6281 Muscle weakness (generalized): Secondary | ICD-10-CM

## 2024-08-13 DIAGNOSIS — R102 Pelvic and perineal pain unspecified side: Secondary | ICD-10-CM

## 2024-08-13 DIAGNOSIS — M62838 Other muscle spasm: Secondary | ICD-10-CM

## 2024-08-13 DIAGNOSIS — R279 Unspecified lack of coordination: Secondary | ICD-10-CM

## 2024-08-13 DIAGNOSIS — R269 Unspecified abnormalities of gait and mobility: Secondary | ICD-10-CM

## 2024-08-13 NOTE — Therapy (Signed)
 OUTPATIENT PHYSICAL THERAPY TREATMENT NOTE   Patient Name: Anna Ramsey MRN: [id number] DOB:09-01-1966, 58 y.o., female Today's Date: 08/13/2024  END OF SESSION:  PT End of Session - 08/13/24 0847     Visit Number 45    Date for Recertification  10/10/24    Authorization Type [employer]    PT Start Time 0800    PT Stop Time 0845    PT Time Calculation (min) 45 min    Activity Tolerance Patient tolerated treatment well    Behavior During Therapy [facility] for tasks assessed/performed             Past Medical History:  Diagnosis Date   ADHD (attention deficit hyperactivity disorder) 2024   Dysuria    Erythematous bladder mucosa    Heart murmur    asymptomatic per pt --  no echo   Hematuria    History of cervical cancer    12/ 2014  Stage IB2--  s/p  TAH w/ BSO and pelvic lymphadectomy/  and radiation therapy   History of radiation therapy 10/20/13-11/28/13   pelvis 50.4 gray   Hypertension    Lesion of bladder    Mild intermittent asthma    Neuromuscular disorder (HCC)    Seasonal allergies    Thyroid disease    Past Surgical History:  Procedure Laterality Date   CYSTOSCOPY WITH BIOPSY N/A 04/29/2015   Procedure: CYSTOSCOPY WITH BIOPSY WITH FULGERATION;  Surgeon: Belvie LITTIE Clara, MD;  Location: [facility];  Service: Urology;  Laterality: N/A;  1 HR [phone] [id number]   FOOT SURGERY Left 2010   RADICAL ABDOMINAL HYSTERECTOMY  09-05-2013   w/ BILATERAL SALPINGOOPHORECTOMY AND PELVIC LYMPHADECTOMY   Patient Active Problem List   Diagnosis Date Noted   ADD (attention deficit disorder) 04/21/2024   SUI (stress urinary incontinence, female) 11/01/2023   History of urinary retention 11/01/2023   Neurogenic bladder 10/18/2023   Hematuria 10/18/2023   Acute urinary retention 10/18/2023   Stress and adjustment reaction 01/13/2021   Inattention 10/28/2019   Hypothyroid 11/13/2016   Neuropathy involving both lower extremities 11/13/2016    Cystitis, radiation 05/22/2014   Cervical cancer (HCC) 09/01/2013   Adenocarcinoma of cervix, 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   Asthma, mild intermittent 07/07/2009   INSOMNIA 05/28/2008   Generalized anxiety disorder 05/07/2008   HYPERTENSION, BENIGN 05/31/2007    PCP: Geraline Dorothyann JONETTA, MD  REFERRING PROVIDER: Skeet Juliene SAUNDERS, DO   REFERRING DIAG:  [phone] (ICD-10-CM) - Bilateral leg weakness G62.89 (ICD-10-CM) - 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 I am using my walker all the time and I am ok with that.  Eval: Right leg worse than left but both painful. Right has instances of knee just giving out, has neuropathy - like weakness, pain since radiation in 2015. The nerves have been affected and greatly limiting her activity. Does have symptoms of numbness, sensory deficits, weakness, ataxic gait, and painful but all vary based on the day.     Fluid intake: Yes: water    PAIN: 08/04/24 Are you having pain? Leg/foot feels heavy/tingly NPRS scale: 0/10, just numb and tingly Pain location: bil legs Right leg Pain type: aching Pain description: constant   Aggravating factors: walking, end of day, rainy days Relieving factors: try not to walk, decreasing activities    PRECAUTIONS: Other: cervical cancer with radiation  RED FLAGS: None   WEIGHT BEARING RESTRICTIONS: No  FALLS:  Has patient fallen in  last 6 months? Yes. Number of falls 1x/weekly due to weakness and decreased balance from having the radiation, trouble with low light - reports she does need to furniture walk to help with balance  LIVING ENVIRONMENT: Lives with: lives with their family  OCCUPATION: sits with laptop on her lap for 12 hours per day; unable to sit with legs down due to them falling asleep  PLOF: Independent  PATIENT GOALS: to strengthening enough to walk across grass, wants to have better balance, stronger legs                                        PERTINENT HISTORY:  ADHD; Cervical Cancer 2014; 9 weeks of radiation; Radical hysterectomy;    OBJECTIVE:  Note: Objective measures were completed at Evaluation unless otherwise noted.  DIAGNOSTIC FINDINGS:   PATIENT SURVEYS:  Lower Extremity Functional Score: 5 / 80 = 6.3 % 06/16/24 LEFS 28 / 80 = 35.0 % 07/15/24  19 / 80 = 23.8 %  COGNITION: Overall cognitive status: Within functional limits for tasks assessed     SENSATION: Poor sensation at Rt LE - light touch felt along posterior gastroc only no awareness without visualization of being touched at medial, lateral, anterior lower leg, reports numbness in bil feet Rt>Lt  EDEMA:  Rt ankle sometimes swells per pt  MUSCLE LENGTH: Bil hamstrings and adductors limited by 25%;  POSTURE: rounded shoulders and forward head  LOWER EXTREMITY ROM:  weakness and poor coordination/proprioception   LOWER EXTREMITY MMT:  02/04/24 hip flexion was R 3/5, L 4/5  MMT Right Eval */5 Left Eval */5 Right 5/16 Left 5/16 RIGHT 6/6 LEFT  6/6 Right 10/21 Left  10/21  Hip flexion 2 3 4+ 5 3  3  4+  Hip extension 2 3 4+ 4 4+ 4 3+ 4  Hip abduction 3 3+ 5- 5 5     Hip adduction 1+ 2+ 2+ 2+ 2+ 2+ 2+ 2+  Hip internal rotation          Hip external rotation          Knee flexion 2 3 3- 4 3+ 5 2+ 4  Knee extension 2+ 3 3- 5 3+     Ankle dorsiflexion 0 4 0 4+ long sitting 2 4  4+ long sitting 2      Ankle  plantarflexion 3 4        Ankle inversion 1 4 3+ tested in long sitting  4+ tested in long sitting 3+ in long sitting 5 in long sitting    Ankle eversion 2 4 3+ tested in long sitting 4- tested in long sitting 2+ 4- tested in long sitting     (Blank rows = not tested)   FUNCTIONAL TESTS:  Five times Sit to Stand Test (FTSS) Method: Use a straight back chair with a solid seat that is 16-18" high. Ask participant to sit on the chair with arms folded across their chest.   Instructions: "Stand up and sit down as quickly as possible 5 times, keeping your arms folded across your chest."   Measurement: Stop timing when the participant stands the 5th time.  TIME: ______ (in seconds)  Times > 13.6 seconds is associated with increased disability and morbidity (Guralnik, 2000) Times > 15 seconds is predictive of recurrent falls in healthy individuals aged 87 and older (Buatois, et al., 2008) Normal performance values in community dwelling individuals aged 23 and older (Bohannon, 2006): 50-59 years: 8 seconds 60-69 years: 11.4 seconds 70-79 years: 12.6 seconds 80-89 years: 14.8 seconds  MCID: >= 2.3 seconds for Vestibular Disorders (Meretta, 2006)  5 times sit to stand: 22s with hands - very unstable with poor hip, posture, core control  Timed up and go (TUG): 28s with SBQC - min A   07/15/24 37.10 sec (one stand with no UE support, 4 with B UE support and last two with min A upon standing)   GAIT: Distance walked: 200' Assistive device utilized: Quad cane small base Level of assistance: CGA Comments: Rt LE demonstrating ataxic pattern, Lt trunk lean intermittently  to right self, poor step height bil, decreased stride length, limited glute activation    06/16/24 266 ft with SPC, improved step height and step length, intermittent LOB if pt turns to speak to therapist, but pt able to right herself independently. Minimal trunk lean.                                                                                                                                TREATMENT DATE:   08/13/24: Pt arrives for aquatic physical therapy. Treatment took place in 3.5-5.5 feet of water . Water  temperature was 90 degrees f. Pt entered the pool via steps independently with use of bil rails, HHA to get from walker to stairs. Pt requires buoyancy of water  for support and to offload joints with strengthening exercises.  Pt utilizes viscosity of the water  required for strengthening. Seated water  bench with 75% submersion     -Seated decompression to give LE moment to acclimate to water  and exercise.    -60% water  depth water  walking with Qtip 4x each direction with Vc for control of LE Did 6 lengths with side stepping to focus on adductors -static core with arm sway hold pink bells x20, then tandem stance 20x each side -Plank with RB hand weights, toes on floor  45 sec 3x -Static hip add/abd 15x  Bil   08/08/24: Nustep level 5, LE 3/ UE 9 (green machine) x6 min with PTA present to discuss status In // bars for following: Front and retro heel-toe x 1 lap each with VC's to focus on foot placement as able; bil sidestepping x 1 rep each Stepping over orange obstacles x 2 laps, then bil sidestepping over obstacles x 1 each Bil HS stretch standing in // bars, then seated figure 4 piriformis stretch x 2 reps each, 20 sec each Free Motion Machine: Standing on purple balance pad for scap retract with 3# x 10, then bil UE ext x 10 with CBA-CGA throughout and VC's for core and gluts engaged for stability; then D2 with cable (machine arm at 5) and 3# x 5 reps each returning demo and standing on floor with SBA, encouraged core engagement and full trunk rotation   Front and retro heel-toe walking on airex x 2 laps each in // bars encouraging fingertips as able   08/04/2024: Nustep level 5 (green machine) x6 min with PT present to discuss status Side stepping at barre down and back 3 laps Standing at barre  performing unilateral foot tap over small hurdle and back x10 bilat Standing shoulder rows with red tband 2x10 Standing shoulder extension with red tband 2x10 Standing unilateral row with twist with red tband x10 bilat (with unilateral support with one hand on support bar) Seated with foot step up and over and back on small cone x10 bilat (difficulty with right LE)     PATIENT EDUCATION:  Education details: [email or web address] - uses App Person educated: Patient Education method: Explanation, Demonstration, Tactile cues, Verbal cues, and Handouts Education comprehension: verbalized understanding, returned demonstration, verbal cues required, tactile cues required, and needs further education  HOME EXERCISE PROGRAM: Access Code: [email or web address] URL: [email or web address] Date: 07/07/2024 Prepared by: Mliss  Exercises - Supine Heel Slide with Strap  - 1 x daily - 7 x weekly - 3 sets - 10 reps - Pillow squeezes with kegel  - 1 x daily - 7 x weekly - 3 sets - 10 reps - Sit to Stand with Counter Support  - 1 x daily - 7 x weekly - 1 sets - 10 reps - Seated Long Arc Quad  - 1 x daily - 7 x weekly - 1-3 sets - 10 reps - 5 sec hold - Seated Isometric Hip Abduction with Resistance  - 1 x daily - 7 x weekly - 1-3 sets - 10 reps - Heel Raises with Counter Support  - 1 x daily - 7 x weekly - 1-3 sets - 10 reps - Standing Terminal Knee Extension at Wall with Ball  - 2 x daily - 7 x weekly - 2 sets - 10 reps - 5 sec hold - Squat with Chair Touch and Resistance Loop  - 1 x daily - 3 x weekly - 2 sets - 10 reps - Standing Knee Flexion with Counter Support  - 1 x daily - 3 x weekly - 2 sets - 10 reps - Supine Bridge with Resistance Band  - 1 x daily - 7 x weekly - 1-2 sets - 10 reps - 5 sec hold - Standing Hip Abduction on Slider  - 1 x daily - 3 x weekly - 2 sets - 10 reps - Calf stretch on rocker board  - 1 x daily - 7 x weekly - 1 sets - 3 reps - 30 sec  hold - Standing Anti-Rotation Press with  Anchored Resistance  -  1 x daily - 3 x weekly - 2 sets - 10 reps - Standing Trunk Rotation with Resistance  - 1 x daily - 3 x weekly - 2 sets - 10 reps - Right Modified Dix Hallpike with Pillows  - 1 x daily - 1 x weekly - 1 sets - 1 reps - 30 sec hold  ASSESSMENT:  CLINICAL IMPRESSION: Took some time to get her balance acclimated back to moving in the water  especially without the support of her walker. Pt showed nice improvement with practice/repetition in gaining stability. She does complain of a tightnes or light pain in her RT adductor region which actually improved with todays exercise.   Eval: Patient is a 58 y.o. female  who was seen today for physical therapy evaluation and tr in the teatment for poor gait mechanics, impaired posture, impaired balance, decreased hip and core strength, increased fall risk. Pt has complex history of  Radiation-induced lumbosacral plexopathy and Polyradiculoneuropathy status post cervical cancer. Pt has completed pelvic floor PT recently and had positive outcomes with this and resolution of symptoms. Pt very motivated to participate in PT for bil LE strengthening and decreased fall risk. Pt demonstrated impairments with objective measurements of TUG and 5xSTS indicative of increased fall risk and balance deficits. Pt reports she is unable to hold and carry grandchildren, to walk on uneven surfaces, needs assistance with dressing, walking, transfers sometimes and wants to be more I. Pt would benefit from additional PT to further address deficits.    OBJECTIVE IMPAIRMENTS: Abnormal gait, decreased activity tolerance, decreased coordination, decreased endurance, decreased mobility, difficulty walking, decreased strength, increased fascial restrictions, increased muscle spasms, impaired flexibility, impaired sensation, improper body mechanics, postural dysfunction, and pain.   ACTIVITY LIMITATIONS: carrying, lifting, bending, sitting, standing, squatting, stairs,  transfers, bed mobility, locomotion level, and caring for others  PARTICIPATION LIMITATIONS: meal prep, cleaning, laundry, interpersonal relationship, driving, shopping, community activity, occupation, and yard work  PERSONAL FACTORS: Fitness, Past/current experiences, Time since onset of injury/illness/exacerbation, and 1 comorbidity: medical history are also affecting patient's functional outcome.   REHAB POTENTIAL: Good  CLINICAL DECISION MAKING: Evolving/moderate complexity  EVALUATION COMPLEXITY: Moderate   GOALS: Goals reviewed with patient? Yes  SHORT TERM GOALS: Target date: 02/13/24 Pt to be I with HEP for carry over and continuing recommendations for improved outcomes.   Baseline: Goal status: MET  2.  Pt to demonstrate at least 3/5 Rt LE strength grossly and 4/5 Lt for improved pelvic stability and functional squats without falling.  Baseline:  Goal status: PARTIALLY MET 03/03/24  3.  Pt to demonstrate ability to complete static standing without AD for at least 5 mins at midline to complete gentle kitchen tasks/household tasks without fear of falling.  Baseline:  Goal status: MET  4.  Pt to demonstrate I with dynamic sitting balance to better be able to reach floor level for retrieving fallen objects and putting on socks without falling Baseline:  Goal status: MET 03/03/24  LONG TERM GOALS: Target date: 10/10/24  Pt to be I with advanced HEP for carry over and continuing recommendations for improved outcomes.   Baseline:  Goal status: ongoing  2.  Pt to report improved LEFS score to at least 30/80 for improved I with functional tasks at home such as dressing, walking from room to room.  Baseline:  Goal status: IN PROGRESS 28/80 06/16/24; 07/15/24  19 / 80 = 23.8 %  3.  Pt to demonstrate improved dynamic standing balance with or without AD  at no more than CGA to better be able to play with grandchildren without falling.  Baseline:  Goal status: DEFERRED  4.  Pt to  demonstrate improved gait mechanics with LRAD for 50 ' with minimal deviation from straight path to allow safe ambulation at home. Baseline:  Goal status: MODIFIED 07/15/24    5.  Pt to demonstrate ability to perform x5 Sit to stand with at least 10# to more safely assist in caring for grandchildren without risk of falling.  Baseline:  Goal status: DEFERRED  6.  Pt able to perform 3-5 sit to stands without UE assist showing improved functional LE strength Baseline: 1 in 30 sec on 07/15/24 Goal status: NEW  7.  Patient to report no falls when using AD at home or in the community. Baseline: using rolling walker at home and community (07/28/24) Goal status: Ongoing      PLAN:  PT FREQUENCY: 2x/week  PT DURATION: 12 weeks  PLANNED INTERVENTIONS:    97110-Therapeutic exercises, 97530- Therapeutic activity, 97112- Neuromuscular re-education, 97535- Self Care, 02859- Manual therapy, [phone]- Gait training, [phone]- Canalith repositioning, [id number]- Aquatic Therapy, [phone]- Splinting, [id number]- Electrical stimulation (manual), [id number]- Vasopneumatic device, [id number]- Ultrasound, [id number]- Traction (mechanical), [id number]- Ionotophoresis 4mg /ml Dexamethasone , Patient/Family education, Balance training, Stair training, Taping, Dry Needling, Joint mobilization, Joint manipulation, Spinal manipulation, Spinal mobilization, Scar mobilization, DME instructions, Wheelchair mobility training, Cryotherapy, Moist heat, and Biofeedback  NEXT VISIT: for aquatic PT, Work on ADDuction, core and LE strengthening and neuromuscular re-ed improved gait and mobility LAND: continue with gait training with cane and walker, sit to stands, core and LE strength   Delon Darner, PTA 08/13/24 8:48 AM     [facility] [address] [place], [place] [address] Phone # [phone] Fax [phone]

## 2024-08-14 NOTE — Therapy (Signed)
 OUTPATIENT PHYSICAL THERAPY TREATMENT NOTE   Patient Name: Anna Ramsey MRN: [id number] DOB:[birth year], 58 y.o., female Today's Date: 08/15/2024  END OF SESSION:  PT End of Session - 08/15/24 0801     Visit Number 46    Date for Recertification  10/10/24    Authorization Type [employer]    PT Start Time 0802    PT Stop Time 0843    PT Time Calculation (min) 41 min    Activity Tolerance Patient tolerated treatment well    Behavior During Therapy [facility] for tasks assessed/performed              Past Medical History:  Diagnosis Date   ADHD (attention deficit hyperactivity disorder) 2024   Dysuria    Erythematous bladder mucosa    Heart murmur    asymptomatic per pt --  no echo   Hematuria    History of cervical cancer    12/ 2014  Stage IB2--  s/p  TAH w/ BSO and pelvic lymphadectomy/  and radiation therapy   History of radiation therapy 10/20/13-11/28/13   pelvis 50.4 gray   Hypertension    Lesion of bladder    Mild intermittent asthma    Neuromuscular disorder (HCC)    Seasonal allergies    Thyroid disease    Past Surgical History:  Procedure Laterality Date   CYSTOSCOPY WITH BIOPSY N/A 04/29/2015   Procedure: CYSTOSCOPY WITH BIOPSY WITH FULGERATION;  Surgeon: Belvie LITTIE Clara, MD;  Location: [facility];  Service: Urology;  Laterality: N/A;  1 HR [phone] [id number]   FOOT SURGERY Left 2010   RADICAL ABDOMINAL HYSTERECTOMY  09-05-2013   w/ BILATERAL SALPINGOOPHORECTOMY AND PELVIC LYMPHADECTOMY   Patient Active Problem List   Diagnosis Date Noted   ADD (attention deficit disorder) 04/21/2024   SUI (stress urinary incontinence, female) 11/01/2023   History of urinary retention 11/01/2023   Neurogenic bladder 10/18/2023   Hematuria 10/18/2023   Acute urinary retention 10/18/2023   Stress and adjustment reaction 01/13/2021   Inattention 10/28/2019   Hypothyroid 11/13/2016   Neuropathy involving both lower extremities 11/13/2016    Cystitis, radiation 05/22/2014   Cervical cancer (HCC) 09/01/2013   Adenocarcinoma of cervix, 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   Asthma, mild intermittent 07/07/2009   INSOMNIA 05/28/2008   Generalized anxiety disorder 05/07/2008   HYPERTENSION, BENIGN 05/31/2007    PCP: Geraline Dorothyann JONETTA, MD  REFERRING PROVIDER: Skeet Juliene SAUNDERS, DO   REFERRING DIAG:  [phone] (ICD-10-CM) - Bilateral leg weakness G62.89 (ICD-10-CM) - 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 My legs were sore yesterday.   Eval: Right leg worse than left but both painful. Right has instances of knee just giving out, has neuropathy - like weakness, pain since radiation in 2015. The nerves have been affected and greatly limiting her activity. Does have symptoms of numbness, sensory deficits, weakness, ataxic gait, and painful but all vary based on the day.     Fluid intake: Yes: water    PAIN: 08/15/2024  Are you having pain? Leg/foot feels heavy/tingly NPRS scale: 0/10, just numb and tingly Pain location: bil legs Right leg Pain type: aching Pain description: constant   Aggravating factors: walking, end of day, rainy days Relieving factors: try not to walk, decreasing activities    PRECAUTIONS: Other: cervical cancer with radiation  RED FLAGS: None   WEIGHT BEARING RESTRICTIONS: No  FALLS:  Has patient fallen in last 6 months? Yes. Number of falls 1x/weekly due to weakness  and decreased balance from having the radiation, trouble with low light - reports she does need to furniture walk to help with balance  LIVING ENVIRONMENT: Lives with: lives with their family  OCCUPATION: sits with laptop on her lap for 12 hours per day; unable to sit with legs down due to them falling asleep  PLOF: Independent  PATIENT GOALS: to strengthening enough to walk across grass, wants to have better balance, stronger legs                                        PERTINENT HISTORY:  ADHD; Cervical Cancer 2014; 9 weeks of radiation; Radical hysterectomy;    OBJECTIVE:  Note: Objective measures were completed at Evaluation unless otherwise noted.  DIAGNOSTIC FINDINGS:   PATIENT SURVEYS:  Lower Extremity Functional Score: 5 / 80 = 6.3 % 06/16/24 LEFS 28 / 80 = 35.0 % 07/15/24  19 / 80 = 23.8 %  COGNITION: Overall cognitive status: Within functional limits for tasks assessed     SENSATION: Poor sensation at Rt LE - light touch felt along posterior gastroc only no awareness without visualization of being touched at medial, lateral, anterior lower leg, reports numbness in bil feet Rt>Lt  EDEMA:  Rt ankle sometimes swells per pt  MUSCLE LENGTH: Bil hamstrings and adductors limited by 25%;  POSTURE: rounded shoulders and forward head  LOWER EXTREMITY ROM:  weakness and poor coordination/proprioception   LOWER EXTREMITY MMT:  02/04/24 hip flexion was R 3/5, L 4/5  MMT Right Eval */5 Left Eval */5 Right 5/16 Left 5/16 RIGHT 6/6 LEFT  6/6 Right 10/21 Left  10/21  Hip flexion 2 3 4+ 5 3  3  4+  Hip extension 2 3 4+ 4 4+ 4 3+ 4  Hip abduction 3 3+ 5- 5 5     Hip adduction 1+ 2+ 2+ 2+ 2+ 2+ 2+ 2+  Hip internal rotation          Hip external rotation          Knee flexion 2 3 3- 4 3+ 5 2+ 4  Knee extension 2+ 3 3- 5 3+     Ankle dorsiflexion 0 4 0 4+ long sitting 2 4  4+ long sitting 2      Ankle plantarflexion 3 4        Ankle inversion 1 4 3+ tested in long  sitting 4+ tested in long sitting 3+ in long sitting 5 in long sitting    Ankle eversion 2 4 3+ tested in long sitting 4- tested in long sitting 2+ 4- tested in long sitting     (Blank rows = not tested)   FUNCTIONAL TESTS:  Five times Sit to Stand Test (FTSS) Method: Use a straight back chair with a solid seat that is 16-18" high. Ask participant to sit on the chair with arms folded across their chest.   Instructions: "Stand up and sit down as quickly as possible 5 times, keeping your arms folded across your chest."   Measurement: Stop timing when the participant stands the 5th time.  TIME: ______ (in seconds)  Times > 13.6 seconds is associated with increased disability and morbidity (Guralnik, 2000) Times > 15 seconds is predictive of recurrent falls in healthy individuals aged 12 and older (Buatois, et al., 2008) Normal performance values in community dwelling individuals aged 47 and older (Bohannon, 2006): 50-59 years: 8 seconds 60-69 years: 11.4 seconds 70-79 years: 12.6 seconds 80-89 years: 14.8 seconds  MCID: >= 2.3 seconds for Vestibular Disorders (Meretta, 2006)  5 times sit to stand: 22s with hands - very unstable with poor hip, posture, core control  Timed up and go (TUG): 28s with SBQC - min A   07/15/24 37.10 sec (one stand with no UE support, 4 with B UE support and last two with min A upon standing)   GAIT: Distance walked: 200' Assistive device utilized: Quad cane small base Level of assistance: CGA Comments: Rt LE demonstrating ataxic pattern, Lt trunk lean intermittently  to right self, poor step height bil, decreased stride length, limited glute activation    06/16/24 266 ft with SPC, improved step height and step length, intermittent LOB if pt turns to speak to therapist, but pt able to right herself independently. Minimal trunk lean.                                                                                                                                TREATMENT DATE:  08/15/24: Nustep level 5, LE 3/ UE 9 (green machine) x6 min with PT present to discuss status In // bars for following: Front and retro heel-toe x 1 lap each with VC's to focus on foot placement as able; bil sidestepping x 1 rep each Stepping over orange obstacles x 4 laps, then bil sidestepping over obstacles x 1 each Bil HS stretch standing in // bars, then seated figure 4 piriformis stretch x 2 reps each, 20 sec each Free Motion Machine: Standing on purple balance pad for scap retract with 3# x 10, then bil UE ext x 10 with CBA-CGA throughout and VC's for core and gluts engaged for stability; then D2 with cable (machine arm at 5) and 3# x 5 reps each returning demo and standing on floor with SBA, encouraged core engagement and full trunk rotation   Front  and retro heel-toe walking on airex x 2 laps each in // bars encouraging fingertips as able, then sidestepping x 2 laps Counter push ups x 10    08/13/24: Pt arrives for aquatic physical therapy. Treatment took place in 3.5-5.5 feet of water . Water  temperature was 90 degrees f. Pt entered the pool via steps independently with use of bil rails, HHA to get from walker to stairs. Pt requires buoyancy of water  for support and to offload joints with strengthening exercises.  Pt utilizes viscosity of the water  required for strengthening. Seated water  bench with 75% submersion     -Seated decompression to give LE moment to acclimate to water  and exercise.    -60% water  depth water  walking with Qtip 4x each direction with Vc for control of LE Did 6 lengths with side stepping to focus on adductors -static core with arm sway hold pink bells x20, then tandem stance 20x each side -Plank with RB hand weights, toes on floor  45 sec 3x -Static hip add/abd 15x Bil   08/08/24: Nustep level 5, LE 3/ UE 9 (green machine) x6 min with PTA present to discuss status In // bars for following: Front and retro heel-toe x 1 lap each with VC's to  focus on foot placement as able; bil sidestepping x 1 rep each Stepping over orange obstacles x 2 laps, then bil sidestepping over obstacles x 1 each Bil HS stretch standing in // bars, then seated figure 4 piriformis stretch x 2 reps each, 20 sec each Free Motion Machine: Standing on purple balance pad for scap retract with 3# x 10, then bil UE ext x 10 with CBA-CGA throughout and VC's for core and gluts engaged for stability; then D2 with cable (machine arm at 5) and 3# x 5 reps each returning demo and standing on floor with SBA, encouraged core engagement and full trunk rotation   Front and retro heel-toe walking on airex x 2 laps each in // bars encouraging fingertips as able   08/04/2024: Nustep level 5 (green machine) x6 min with PT present to discuss status Side stepping at barre down and back 3 laps Standing at barre performing unilateral foot tap over small hurdle and back x10 bilat Standing shoulder rows with red tband 2x10 Standing shoulder extension with red tband 2x10 Standing unilateral row with twist with red tband x10 bilat (with unilateral support with one hand on support bar) Seated with foot step up and over and back on small cone x10 bilat (difficulty with right LE)     PATIENT EDUCATION:  Education details: [email or web address] - uses App Person educated: Patient Education method: Explanation, Demonstration, Tactile cues, Verbal cues, and Handouts Education comprehension: verbalized understanding, returned demonstration, verbal cues required, tactile cues required, and needs further education  HOME EXERCISE PROGRAM: Access Code: [email or web address] URL: [email or web address] Date: 07/07/2024 Prepared by: Mliss  Exercises - Supine Heel Slide with Strap  - 1 x daily - 7 x weekly - 3 sets - 10 reps - Pillow squeezes with kegel  - 1 x daily - 7 x weekly - 3 sets - 10 reps - Sit to Stand with Counter Support  - 1 x daily - 7 x weekly - 1 sets - 10 reps - Seated Long Arc Quad   - 1 x daily - 7 x weekly - 1-3 sets - 10 reps - 5 sec hold - Seated Isometric Hip Abduction with Resistance  - 1 x daily - 7 x weekly - 1-3 sets - 10  reps - Heel Raises with Counter Support  - 1 x daily - 7 x weekly - 1-3 sets - 10 reps - Standing Terminal Knee Extension at Wall with Ball  - 2 x daily - 7 x weekly - 2 sets - 10 reps - 5 sec hold - Squat with Chair Touch and Resistance Loop  - 1 x daily - 3 x weekly - 2 sets - 10 reps - Standing Knee Flexion with Counter Support  - 1 x daily - 3 x weekly - 2 sets - 10 reps - Supine Bridge with Resistance Band  - 1 x daily - 7 x weekly - 1-2 sets - 10 reps - 5 sec hold - Standing Hip Abduction on Slider  - 1 x daily - 3 x weekly - 2 sets - 10 reps - Calf stretch on rocker board  - 1 x daily - 7 x weekly - 1 sets - 3 reps - 30 sec  hold - Standing Anti-Rotation Press with Anchored Resistance  - 1 x daily - 3 x weekly - 2 sets - 10 reps - Standing Trunk Rotation with Resistance  - 1 x daily - 3 x weekly - 2 sets - 10 reps - Right Modified Dix Hallpike with Pillows  - 1 x daily - 1 x weekly - 1 sets - 1 reps - 30 sec hold  ASSESSMENT:  CLINICAL IMPRESSION: Anna Ramsey did very well with core/balance work today. She needed some cues with hurdles to keep L glute med engaged while stepping over the hurdle with her R to avoid circumduction. Added counter push ups for UE strength as she pushes up from the floor a lot during the day.   Eval: Patient is a 58 y.o. female  who was seen today for physical therapy evaluation and tr in the teatment for poor gait mechanics, impaired posture, impaired balance, decreased hip and core strength, increased fall risk. Pt has complex history of  Radiation-induced lumbosacral plexopathy and Polyradiculoneuropathy status post cervical cancer. Pt has completed pelvic floor PT recently and had positive outcomes with this and resolution of symptoms. Pt very motivated to participate in PT for bil LE strengthening and decreased fall  risk. Pt demonstrated impairments with objective measurements of TUG and 5xSTS indicative of increased fall risk and balance deficits. Pt reports she is unable to hold and carry grandchildren, to walk on uneven surfaces, needs assistance with dressing, walking, transfers sometimes and wants to be more I. Pt would benefit from additional PT to further address deficits.    OBJECTIVE IMPAIRMENTS: Abnormal gait, decreased activity tolerance, decreased coordination, decreased endurance, decreased mobility, difficulty walking, decreased strength, increased fascial restrictions, increased muscle spasms, impaired flexibility, impaired sensation, improper body mechanics, postural dysfunction, and pain.   ACTIVITY LIMITATIONS: carrying, lifting, bending, sitting, standing, squatting, stairs, transfers, bed mobility, locomotion level, and caring for others  PARTICIPATION LIMITATIONS: meal prep, cleaning, laundry, interpersonal relationship, driving, shopping, community activity, occupation, and yard work  PERSONAL FACTORS: Fitness, Past/current experiences, Time since onset of injury/illness/exacerbation, and 1 comorbidity: medical history are also affecting patient's functional outcome.   REHAB POTENTIAL: Good  CLINICAL DECISION MAKING: Evolving/moderate complexity  EVALUATION COMPLEXITY: Moderate   GOALS: Goals reviewed with patient? Yes  SHORT TERM GOALS: Target date: 02/13/24 Pt to be I with HEP for carry over and continuing recommendations for improved outcomes.   Baseline: Goal status: MET  2.  Pt to demonstrate at least 3/5 Rt LE strength grossly and 4/5 Lt for improved pelvic  stability and functional squats without falling.  Baseline:  Goal status: PARTIALLY MET 03/03/24  3.  Pt to demonstrate ability to complete static standing without AD for at least 5 mins at midline to complete gentle kitchen tasks/household tasks without fear of falling.  Baseline:  Goal status: MET  4.  Pt to  demonstrate I with dynamic sitting balance to better be able to reach floor level for retrieving fallen objects and putting on socks without falling Baseline:  Goal status: MET 03/03/24  LONG TERM GOALS: Target date: 10/10/24  Pt to be I with advanced HEP for carry over and continuing recommendations for improved outcomes.   Baseline:  Goal status: ongoing  2.  Pt to report improved LEFS score to at least 30/80 for improved I with functional tasks at home such as dressing, walking from room to room.  Baseline:  Goal status: IN PROGRESS 28/80 06/16/24; 07/15/24  19 / 80 = 23.8 %  3.  Pt to demonstrate improved dynamic standing balance with or without AD at no more than CGA to better be able to play with grandchildren without falling.  Baseline:  Goal status: DEFERRED  4.  Pt to demonstrate improved gait mechanics with LRAD for 50 ' with minimal deviation from straight path to allow safe ambulation at home. Baseline:  Goal status: MODIFIED 07/15/24    5.  Pt to demonstrate ability to perform x5 Sit to stand with at least 10# to more safely assist in caring for grandchildren without risk of falling.  Baseline:  Goal status: DEFERRED  6.  Pt able to perform 3-5 sit to stands without UE assist showing improved functional LE strength Baseline: 1 in 30 sec on 07/15/24 Goal status: NEW  7.  Patient to report no falls when using AD at home or in the community. Baseline: using rolling walker at home and community (07/28/24) Goal status: Ongoing      PLAN:  PT FREQUENCY: 2x/week  PT DURATION: 12 weeks  PLANNED INTERVENTIONS:    97110-Therapeutic exercises, 97530- Therapeutic activity, 97112- Neuromuscular re-education, 97535- Self Care, [id number]- Manual therapy, [phone]- Gait training, [phone]- Canalith repositioning, [id number]- Aquatic Therapy, [phone]- Splinting, [id number]- Electrical stimulation (manual), [id number]- Vasopneumatic device, [id number]- Ultrasound, [id number]- Traction (mechanical), [id number]-  Ionotophoresis 4mg /ml Dexamethasone , Patient/Family education, Balance training, Stair training, Taping, Dry Needling, Joint mobilization, Joint manipulation, Spinal manipulation, Spinal mobilization, Scar mobilization, DME instructions, Wheelchair mobility training, Cryotherapy, Moist heat, and Biofeedback  NEXT VISIT: for aquatic PT, Work on ADDuction, core and LE strengthening and neuromuscular re-ed improved gait and mobility LAND: continue with gait training with cane and walker, sit to stands, core and LE strength   Mliss Cummins, PT 08/15/24 8:44 AM     [facility] [address] [place], [place] [address] Phone # [phone] Fax [phone]

## 2024-08-15 ENCOUNTER — Ambulatory Visit: Admitting: Physical Therapy

## 2024-08-15 ENCOUNTER — Encounter: Payer: Self-pay | Admitting: Physical Therapy

## 2024-08-15 DIAGNOSIS — R293 Abnormal posture: Secondary | ICD-10-CM

## 2024-08-15 DIAGNOSIS — R269 Unspecified abnormalities of gait and mobility: Secondary | ICD-10-CM

## 2024-08-15 DIAGNOSIS — M6281 Muscle weakness (generalized): Secondary | ICD-10-CM

## 2024-08-17 NOTE — Therapy (Signed)
 OUTPATIENT PHYSICAL THERAPY TREATMENT NOTE   Patient Name: Anna Ramsey MRN: [id number] DOB:11-12-1965, 58 y.o., female Today's Date: 08/18/2024  END OF SESSION:  PT End of Session - 08/18/24 0805     Visit Number 47    Authorization Type [employer]    PT Start Time 0801    PT Stop Time 0848    PT Time Calculation (min) 47 min    Activity Tolerance Patient tolerated treatment well    Behavior During Therapy [facility] for tasks assessed/performed               Past Medical History:  Diagnosis Date   ADHD (attention deficit hyperactivity disorder) 2024   Dysuria    Erythematous bladder mucosa    Heart murmur    asymptomatic per pt --  no echo   Hematuria    History of cervical cancer    12/ 2014  Stage IB2--  s/p  TAH w/ BSO and pelvic lymphadectomy/  and radiation therapy   History of radiation therapy 10/20/13-11/28/13   pelvis 50.4 gray   Hypertension    Lesion of bladder    Mild intermittent asthma    Neuromuscular disorder (HCC)    Seasonal allergies    Thyroid disease    Past Surgical History:  Procedure Laterality Date   CYSTOSCOPY WITH BIOPSY N/A 04/29/2015   Procedure: CYSTOSCOPY WITH BIOPSY WITH FULGERATION;  Surgeon: Belvie LITTIE Clara, MD;  Location: [facility];  Service: Urology;  Laterality: N/A;  1 HR [id number]   FOOT SURGERY Left 2010   RADICAL ABDOMINAL HYSTERECTOMY  09-05-2013   w/ BILATERAL SALPINGOOPHORECTOMY AND PELVIC LYMPHADECTOMY   Patient Active Problem List   Diagnosis Date Noted   ADD (attention deficit disorder) 04/21/2024   SUI (stress urinary incontinence, female) 11/01/2023   History of urinary retention 11/01/2023   Neurogenic bladder 10/18/2023   Hematuria 10/18/2023   Acute urinary retention 10/18/2023   Stress and adjustment reaction 01/13/2021   Inattention 10/28/2019   Hypothyroid 11/13/2016   Neuropathy involving both lower extremities 11/13/2016   Cystitis, radiation 05/22/2014    Cervical cancer (HCC) 09/01/2013   Adenocarcinoma of cervix, 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   Asthma, mild intermittent 07/07/2009   INSOMNIA 05/28/2008   Generalized anxiety disorder 05/07/2008   HYPERTENSION, BENIGN 05/31/2007    PCP: Geraline Dorothyann JONETTA, MD  REFERRING PROVIDER: Skeet Juliene SAUNDERS, DO   REFERRING DIAG:  [phone] (ICD-10-CM) - Bilateral leg weakness G62.89 (ICD-10-CM) - 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  Eval: Right leg worse than left but both painful. Right has instances of knee just giving out, has neuropathy - like weakness, pain since radiation in 2015. The nerves have been affected and greatly limiting her activity. Does have symptoms of numbness, sensory deficits, weakness, ataxic gait, and painful but all vary based on the day.     Fluid intake: Yes: water    PAIN: 08/18/2024  Are you having pain? Leg/foot feels heavy/tingly NPRS scale: 0/10, just numb and tingly Pain location: bil legs Right leg Pain type: aching Pain description: constant   Aggravating factors: walking, end of day, rainy days Relieving factors: try not to walk, decreasing activities    PRECAUTIONS: Other: cervical cancer with radiation  RED FLAGS: None   WEIGHT BEARING RESTRICTIONS: No  FALLS:  Has patient fallen in last 6 months? Yes. 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  DIAGNOSTIC FINDINGS:   PATIENT SURVEYS:  Lower Extremity Functional Score: 5 / 80 = 6.3 % 06/16/24 LEFS 28 / 80 = 35.0 % 07/15/24  19 / 80 = 23.8 %  COGNITION: Overall cognitive status: Within functional limits for tasks assessed     SENSATION: Poor sensation at Rt LE - light touch felt along posterior gastroc only no awareness without visualization of being touched at medial, lateral, anterior lower leg, reports numbness in bil feet Rt>Lt  EDEMA:  Rt ankle sometimes swells per pt  MUSCLE LENGTH: Bil hamstrings and adductors limited by 25%;  POSTURE: rounded shoulders and forward head  LOWER EXTREMITY ROM:  weakness and poor coordination/proprioception   LOWER EXTREMITY MMT:  02/04/24 hip flexion was R 3/5, L 4/5  MMT Right Eval */5 Left Eval */5 Right 5/16 Left 5/16 RIGHT 6/6 LEFT  6/6 Right 10/21 Left  10/21  Hip flexion 2 3 4+ 5 3  3  4+  Hip extension 2 3 4+ 4 4+ 4 3+ 4  Hip abduction 3 3+ 5- 5 5     Hip adduction 1+ 2+ 2+ 2+ 2+ 2+ 2+ 2+  Hip internal rotation          Hip external rotation          Knee flexion 2 3 3- 4 3+ 5 2+ 4  Knee extension 2+ 3 3- 5 3+     Ankle dorsiflexion 0 4 0 4+ long sitting 2 4  4+ long sitting 2      Ankle plantarflexion 3 4        Ankle inversion 1 4 3+ tested in long sitting 4+ tested in long sitting 3+ in long sitting 5 in long sitting  Ankle eversion 2 4 3+  tested in long sitting 4- tested in long sitting 2+ 4- tested in long sitting     (Blank rows = not tested)   FUNCTIONAL TESTS:  Five times Sit to Stand Test (FTSS) Method: Use a straight back chair with a solid seat that is 16-18" high. Ask participant to sit on the chair with arms folded across their chest.   Instructions: "Stand up and sit down as quickly as possible 5 times, keeping your arms folded across your chest."   Measurement: Stop timing when the participant stands the 5th time.  TIME: ______ (in seconds)  Times > 13.6 seconds is associated with increased disability and morbidity (Guralnik, 2000) Times > 15 seconds is predictive of recurrent falls in healthy individuals aged 41 and older (Buatois, et al., 2008) Normal performance values in community dwelling individuals aged 50 and older (Bohannon, 2006): 50-59 years: 8 seconds 60-69 years: 11.4 seconds 70-79 years: 12.6 seconds 80-89 years: 14.8 seconds  MCID: >= 2.3 seconds for Vestibular Disorders (Meretta, 2006)  5 times sit to stand: 22s with hands - very unstable with poor hip, posture, core control  Timed up and go (TUG): 28s with SBQC - min A   07/15/24 37.10 sec (one stand with no UE support, 4 with B UE support and last two with min A upon standing)   GAIT: Distance walked: 200' Assistive device utilized: Quad cane small base Level of assistance: CGA Comments: Rt LE demonstrating ataxic pattern, Lt trunk lean intermittently  to right self, poor step height bil, decreased stride length, limited glute activation    06/16/24 266 ft with SPC, improved step height and step length, intermittent LOB if pt turns to speak to therapist, but pt able to right herself independently. Minimal trunk lean.                                                                                                                               TREATMENT DATE:  08/18/24: Nustep level 7, LE 3/ UE 9 (green machine) x 6 min with PT  present to discuss status Stepping over orange obstacles x 4 laps, then bil sidestepping over obstacles x 1 each Standing hip ext with foot on slider x 10 R Bil HS stretch standing in // bars, then seated figure 4 piriformis stretch x 2 reps each, 20 sec each Free Motion Machine: Standing on purple balance pad for scap retract with 3# x 10, then bil UE ext x 10 with CBA-CGA throughout and VC's for core and gluts engaged for stability; then D2 with cable (machine arm at 5) and 3# x 5 reps each returning demo and standing on floor with SBA, encouraged core engagement and full trunk rotation   Front and retro heel-toe walking on airex x 2 laps each in // bars encouraging fingertips as able, then sidestepping x 2 laps Floor push ups x 4, also plank walkouts x 2 RW  with red tband resistance tied to walker and R ankle for eccentric strengthening in stance phase and hip flexion assist in swing phase - walked around cancer gym and down to ortho gym - issued for home    08/15/24: Nustep level 5, LE 3/ UE 9 (green machine) x6 min with PT present to discuss status In // bars for following: Front and retro heel-toe x 1 lap each with VC's to focus on foot placement as able; bil sidestepping x 1 rep each Stepping over orange obstacles x 4 laps, then bil sidestepping over obstacles x 1 each Bil HS stretch standing in // bars, then seated figure 4 piriformis stretch x 2 reps each, 20 sec each Free Motion Machine: Standing on purple balance pad for scap retract with 3# x 10, then bil UE ext x 10 with CBA-CGA throughout and VC's for core and gluts engaged for stability; then D2 with cable (machine arm at 5) and 3# x 5 reps each returning demo and standing on floor with SBA, encouraged core engagement and full trunk rotation   Front and retro heel-toe walking on airex x 2 laps each in // bars encouraging fingertips as able, then sidestepping x 2 laps Counter push ups x 10    08/13/24: Pt arrives for aquatic  physical therapy. Treatment took place in 3.5-5.5 feet of water . Water  temperature was 90 degrees f. Pt entered the pool via steps independently with use of bil rails, HHA to get from walker to stairs. Pt requires buoyancy of water  for support and to offload joints with strengthening exercises.  Pt utilizes viscosity of the water  required for strengthening. Seated water  bench with 75% submersion     -Seated decompression to give LE moment to acclimate to water  and exercise.    -60% water  depth water  walking with Qtip 4x each direction with Vc for control of LE Did 6 lengths with side stepping to focus on adductors -static core with arm sway hold pink bells x20, then tandem stance 20x each side -Plank with RB hand weights, toes on floor  45 sec 3x -Static hip add/abd 15x Bil   08/08/24: Nustep level 5, LE 3/ UE 9 (green machine) x6 min with PTA present to discuss status In // bars for following: Front and retro heel-toe x 1 lap each with VC's to focus on foot placement as able; bil sidestepping x 1 rep each Stepping over orange obstacles x 2 laps, then bil sidestepping over obstacles x 1 each Bil HS stretch standing in // bars, then seated figure 4 piriformis stretch x 2 reps each, 20 sec each Free Motion Machine: Standing on purple balance pad for scap retract with 3# x 10, then bil UE ext x 10 with CBA-CGA throughout and VC's for core and gluts engaged for stability; then D2 with cable (machine arm at 5) and 3# x 5 reps each returning demo and standing on floor with SBA, encouraged core engagement and full trunk rotation   Front and retro heel-toe walking on airex x 2 laps each in // bars encouraging fingertips as able   08/04/2024: Nustep level 5 (green machine) x6 min with PT present to discuss status Side stepping at barre down and back 3 laps Standing at barre performing unilateral foot tap over small hurdle and back x10 bilat Standing shoulder rows with red tband 2x10 Standing shoulder  extension with red tband 2x10 Standing unilateral row with twist with red tband x10 bilat (with unilateral support with one hand on  support bar) Seated with foot step up and over and back on small cone x10 bilat (difficulty with right LE)     PATIENT EDUCATION:  Education details: [email or web address] - uses App Person educated: Patient Education method: Explanation, Demonstration, Tactile cues, Verbal cues, and Handouts Education comprehension: verbalized understanding, returned demonstration, verbal cues required, tactile cues required, and needs further education  HOME EXERCISE PROGRAM: Access Code: [email or web address] URL: [email or web address] Date: 07/07/2024 Prepared by: Mliss  Exercises - Supine Heel Slide with Strap  - 1 x daily - 7 x weekly - 3 sets - 10 reps - Pillow squeezes with kegel  - 1 x daily - 7 x weekly - 3 sets - 10 reps - Sit to Stand with Counter Support  - 1 x daily - 7 x weekly - 1 sets - 10 reps - Seated Long Arc Quad  - 1 x daily - 7 x weekly - 1-3 sets - 10 reps - 5 sec hold - Seated Isometric Hip Abduction with Resistance  - 1 x daily - 7 x weekly - 1-3 sets - 10 reps - Heel Raises with Counter Support  - 1 x daily - 7 x weekly - 1-3 sets - 10 reps - Standing Terminal Knee Extension at Wall with Ball  - 2 x daily - 7 x weekly - 2 sets - 10 reps - 5 sec hold - Squat with Chair Touch and Resistance Loop  - 1 x daily - 3 x weekly - 2 sets - 10 reps - Standing Knee Flexion with Counter Support  - 1 x daily - 3 x weekly - 2 sets - 10 reps - Supine Bridge with Resistance Band  - 1 x daily - 7 x weekly - 1-2 sets - 10 reps - 5 sec hold - Standing Hip Abduction on Slider  - 1 x daily - 3 x weekly - 2 sets - 10 reps - Calf stretch on rocker board  - 1 x daily - 7 x weekly - 1 sets - 3 reps - 30 sec  hold - Standing Anti-Rotation Press with Anchored Resistance  - 1 x daily - 3 x weekly - 2 sets - 10 reps - Standing Trunk Rotation with Resistance  - 1 x daily - 3 x weekly -  2 sets - 10 reps - Right Modified Dix Hallpike with Pillows  - 1 x daily - 1 x weekly - 1 sets - 1 reps - 30 sec hold  ASSESSMENT:  CLINICAL IMPRESSION: Patient demonstrated improved form with hurdles today. She still fatigues in hip flexion with swing phase. She has been doing her pushups at home. We reviewed a plank walkout with pushup today as well which she was able to do. Still challenged in her core with Free motion activities. Added Tband during gait to assist with eccentric strengthening in stance phase and assist with hip flexion in swing phase. Patient really liked this so issued for home. She continues to demonstrate potential for improvement and would benefit from continued skilled therapy to address remaining impairments.     Eval: Patient is a 58 y.o. female  who was seen today for physical therapy evaluation and tr in the teatment for poor gait mechanics, impaired posture, impaired balance, decreased hip and core strength, increased fall risk. Pt has complex history of  Radiation-induced lumbosacral plexopathy and Polyradiculoneuropathy status post cervical cancer. Pt has completed pelvic floor PT recently and had positive outcomes with this and resolution of symptoms. Pt very motivated to  participate in PT for bil LE strengthening and decreased fall risk. Pt demonstrated impairments with objective measurements of TUG and 5xSTS indicative of increased fall risk and balance deficits. Pt reports she is unable to hold and carry grandchildren, to walk on uneven surfaces, needs assistance with dressing, walking, transfers sometimes and wants to be more I. Pt would benefit from additional PT to further address deficits.    OBJECTIVE IMPAIRMENTS: Abnormal gait, decreased activity tolerance, decreased coordination, decreased endurance, decreased mobility, difficulty walking, decreased strength, increased fascial restrictions, increased muscle spasms, impaired flexibility, impaired sensation,  improper body mechanics, postural dysfunction, and pain.   ACTIVITY LIMITATIONS: carrying, lifting, bending, sitting, standing, squatting, stairs, transfers, bed mobility, locomotion level, and caring for others  PARTICIPATION LIMITATIONS: meal prep, cleaning, laundry, interpersonal relationship, driving, shopping, community activity, occupation, and yard work  PERSONAL FACTORS: Fitness, Past/current experiences, Time since onset of injury/illness/exacerbation, and 1 comorbidity: medical history are also affecting patient's functional outcome.   REHAB POTENTIAL: Good  CLINICAL DECISION MAKING: Evolving/moderate complexity  EVALUATION COMPLEXITY: Moderate   GOALS: Goals reviewed with patient? Yes  SHORT TERM GOALS: Target date: 02/13/24 Pt to be I with HEP for carry over and continuing recommendations for improved outcomes.   Baseline: Goal status: MET  2.  Pt to demonstrate at least 3/5 Rt LE strength grossly and 4/5 Lt for improved pelvic stability and functional squats without falling.  Baseline:  Goal status: PARTIALLY MET 03/03/24  3.  Pt to demonstrate ability to complete static standing without AD for at least 5 mins at midline to complete gentle kitchen tasks/household tasks without fear of falling.  Baseline:  Goal status: MET  4.  Pt to demonstrate I with dynamic sitting balance to better be able to reach floor level for retrieving fallen objects and putting on socks without falling Baseline:  Goal status: MET 03/03/24  LONG TERM GOALS: Target date: 10/10/24  Pt to be I with advanced HEP for carry over and continuing recommendations for improved outcomes.   Baseline:  Goal status: ongoing  2.  Pt to report improved LEFS score to at least 30/80 for improved I with functional tasks at home such as dressing, walking from room to room.  Baseline:  Goal status: IN PROGRESS 28/80 06/16/24; 07/15/24  19 / 80 = 23.8 %  3.  Pt to demonstrate improved dynamic standing balance  with or without AD at no more than CGA to better be able to play with grandchildren without falling.  Baseline:  Goal status: DEFERRED  4.  Pt to demonstrate improved gait mechanics with LRAD for 50 ' with minimal deviation from straight path to allow safe ambulation at home. Baseline:  Goal status: MODIFIED 07/15/24    5.  Pt to demonstrate ability to perform x5 Sit to stand with at least 10# to more safely assist in caring for grandchildren without risk of falling.  Baseline:  Goal status: DEFERRED  6.  Pt able to perform 3-5 sit to stands without UE assist showing improved functional LE strength Baseline: 1 in 30 sec on 07/15/24 Goal status: NEW  7.  Patient to report no falls when using AD at home or in the community. Baseline: using rolling walker at home and community (07/28/24) Goal status: Ongoing      PLAN:  PT FREQUENCY: 2x/week  PT DURATION: 12 weeks  PLANNED INTERVENTIONS:    97110-Therapeutic exercises, 97530- Therapeutic activity, [id number]- Neuromuscular re-education, 97535- Self Care, 02859- Manual therapy, [id number]- Gait training, [phone]- Canalith repositioning,  02886- Aquatic Therapy, [phone]- Splinting, 02967- Electrical stimulation (manual), [id number]- Vasopneumatic device, [id number]- Ultrasound, [id number]- Traction (mechanical), [id number]- Ionotophoresis 4mg /ml Dexamethasone , Patient/Family education, Balance training, Stair training, Taping, Dry Needling, Joint mobilization, Joint manipulation, Spinal manipulation, Spinal mobilization, Scar mobilization, DME instructions, Wheelchair mobility training, Cryotherapy, Moist heat, and Biofeedback  NEXT VISIT: for aquatic PT, Work on ADDuction, core and LE strengthening and neuromuscular re-ed improved gait and mobility LAND: continue with gait training with cane and walker, sit to stands, core and LE strength   Mliss Cummins, PT 08/18/24 1:54 PM     [facility] [address], [address] [place], [place]  [address] Phone # [phone] Fax [phone]

## 2024-08-18 ENCOUNTER — Ambulatory Visit: Admitting: Physical Therapy

## 2024-08-18 ENCOUNTER — Encounter: Payer: Self-pay | Admitting: Physical Therapy

## 2024-08-18 DIAGNOSIS — M6281 Muscle weakness (generalized): Secondary | ICD-10-CM | POA: Diagnosis not present

## 2024-08-18 DIAGNOSIS — R293 Abnormal posture: Secondary | ICD-10-CM

## 2024-08-18 DIAGNOSIS — R269 Unspecified abnormalities of gait and mobility: Secondary | ICD-10-CM

## 2024-08-20 ENCOUNTER — Encounter: Payer: Self-pay | Admitting: Physical Therapy

## 2024-08-20 ENCOUNTER — Ambulatory Visit: Admitting: Physical Therapy

## 2024-08-20 DIAGNOSIS — R279 Unspecified lack of coordination: Secondary | ICD-10-CM

## 2024-08-20 DIAGNOSIS — R269 Unspecified abnormalities of gait and mobility: Secondary | ICD-10-CM

## 2024-08-20 DIAGNOSIS — M6281 Muscle weakness (generalized): Secondary | ICD-10-CM

## 2024-08-20 DIAGNOSIS — R102 Pelvic and perineal pain unspecified side: Secondary | ICD-10-CM

## 2024-08-20 DIAGNOSIS — R293 Abnormal posture: Secondary | ICD-10-CM

## 2024-08-20 NOTE — Therapy (Signed)
 OUTPATIENT PHYSICAL THERAPY TREATMENT NOTE   Patient Name: Anna Ramsey MRN: [id number] DOB:08-Oct-1965, 58 y.o., female Today's Date: 08/20/2024  END OF SESSION:  PT End of Session - 08/20/24 1022     Visit Number 48    Date for Recertification  10/10/24    Authorization Type [employer]    PT Start Time 1020    PT Stop Time 1100    PT Time Calculation (min) 40 min    Activity Tolerance Patient tolerated treatment well    Behavior During Therapy [facility] for tasks assessed/performed                Past Medical History:  Diagnosis Date   ADHD (attention deficit hyperactivity disorder) 2024   Dysuria    Erythematous bladder mucosa    Heart murmur    asymptomatic per pt --  no echo   Hematuria    History of cervical cancer    12/ 2014  Stage IB2--  s/p  TAH w/ BSO and pelvic lymphadectomy/  and radiation therapy   History of radiation therapy 10/20/13-11/28/13   pelvis 50.4 gray   Hypertension    Lesion of bladder    Mild intermittent asthma    Neuromuscular disorder (HCC)    Seasonal allergies    Thyroid disease    Past Surgical History:  Procedure Laterality Date   CYSTOSCOPY WITH BIOPSY N/A 04/29/2015   Procedure: CYSTOSCOPY WITH BIOPSY WITH FULGERATION;  Surgeon: Belvie LITTIE Clara, MD;  Location: [facility];  Service: Urology;  Laterality: N/A;  1 HR [phone] [id number]   FOOT SURGERY Left 2010   RADICAL ABDOMINAL HYSTERECTOMY  09-05-2013   w/ BILATERAL SALPINGOOPHORECTOMY AND PELVIC LYMPHADECTOMY   Patient Active Problem List   Diagnosis Date Noted   ADD (attention deficit disorder) 04/21/2024   SUI (stress urinary incontinence, female) 11/01/2023   History of urinary retention 11/01/2023   Neurogenic bladder 10/18/2023   Hematuria 10/18/2023   Acute urinary retention 10/18/2023   Stress and adjustment reaction 01/13/2021   Inattention 10/28/2019   Hypothyroid 11/13/2016   Neuropathy involving both lower extremities 11/13/2016    Cystitis, radiation 05/22/2014   Cervical cancer (HCC) 09/01/2013   Adenocarcinoma of cervix, 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   Asthma, mild intermittent 07/07/2009   INSOMNIA 05/28/2008   Generalized anxiety disorder 05/07/2008   HYPERTENSION, BENIGN 05/31/2007    PCP: Geraline Dorothyann JONETTA, MD  REFERRING PROVIDER: Skeet Juliene SAUNDERS, DO   REFERRING DIAG:  [phone] (ICD-10-CM) - Bilateral leg weakness G62.89 (ICD-10-CM) - 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 The red band is helping me not swing my leg out as much.  Eval: Right leg worse than left but both painful. Right has instances of knee just giving out, has neuropathy - like weakness, pain since radiation in 2015. The nerves have been affected and greatly limiting her activity. Does have symptoms of numbness, sensory deficits, weakness, ataxic gait, and painful but all vary based on the day.     Fluid intake: Yes: water    PAIN: 08/20/2024  Are you having pain? Leg/foot feels heavy/tingly NPRS scale: 0/10, just numb and tingly Pain location: bil legs Right leg Pain type: aching Pain description: constant   Aggravating factors: walking, end of day, rainy days Relieving factors: try not to walk, decreasing activities    PRECAUTIONS: Other: cervical cancer with radiation  RED FLAGS: None   WEIGHT BEARING RESTRICTIONS: No  FALLS:  Has patient fallen in last 6 months?  Yes. Number of falls 1x/weekly due to weakness and decreased balance from having the radiation, trouble with low light - reports she does need to furniture walk to help with balance  LIVING ENVIRONMENT: Lives with: lives with their family  OCCUPATION: sits with laptop on her lap for 12 hours per day; unable to sit with legs down due to them falling asleep  PLOF: Independent  PATIENT GOALS: to strengthening enough to walk across grass, wants to have better balance, stronger legs                                        PERTINENT HISTORY:  ADHD; Cervical Cancer 2014; 9 weeks of radiation; Radical hysterectomy;    OBJECTIVE:  Note: Objective measures were completed at Evaluation unless otherwise noted.  DIAGNOSTIC FINDINGS:   PATIENT SURVEYS:  Lower Extremity Functional Score: 5 / 80 = 6.3 % 06/16/24 LEFS 28 / 80 = 35.0 % 07/15/24  19 / 80 = 23.8 %  COGNITION: Overall cognitive status: Within functional limits for tasks assessed     SENSATION: Poor sensation at Rt LE - light touch felt along posterior gastroc only no awareness without visualization of being touched at medial, lateral, anterior lower leg, reports numbness in bil feet Rt>Lt  EDEMA:  Rt ankle sometimes swells per pt  MUSCLE LENGTH: Bil hamstrings and adductors limited by 25%;  POSTURE: rounded shoulders and forward head  LOWER EXTREMITY ROM:  weakness and poor coordination/proprioception   LOWER EXTREMITY MMT:  02/04/24 hip flexion was R 3/5, L 4/5  MMT Right Eval */5 Left Eval */5 Right 5/16 Left 5/16 RIGHT 6/6 LEFT  6/6 Right 10/21 Left  10/21  Hip flexion 2 3 4+ 5 3  3  4+  Hip extension 2 3 4+ 4 4+ 4 3+ 4  Hip abduction 3 3+ 5- 5 5     Hip adduction 1+ 2+ 2+ 2+ 2+ 2+ 2+ 2+  Hip internal rotation          Hip external rotation          Knee flexion 2 3 3- 4 3+ 5 2+ 4  Knee extension 2+ 3 3- 5 3+     Ankle dorsiflexion 0 4 0 4+ long sitting 2 4  4+ long sitting 2      Ankle plantarflexion 3 4         Ankle inversion 1 4 3+ tested in long sitting 4+  tested in long sitting 3+ in long sitting 5 in long sitting    Ankle eversion 2 4 3+ tested in long sitting 4- tested in long sitting 2+ 4- tested in long sitting     (Blank rows = not tested)   FUNCTIONAL TESTS:  Five times Sit to Stand Test (FTSS) Method: Use a straight back chair with a solid seat that is 16-18" high. Ask participant to sit on the chair with arms folded across their chest.   Instructions: "Stand up and sit down as quickly as possible 5 times, keeping your arms folded across your chest."   Measurement: Stop timing when the participant stands the 5th time.  TIME: ______ (in seconds)  Times > 13.6 seconds is associated with increased disability and morbidity (Guralnik, 2000) Times > 15 seconds is predictive of recurrent falls in healthy individuals aged 56 and older (Buatois, et al., 2008) Normal performance values in community dwelling individuals aged 35 and older (Bohannon, 2006): 50-59 years: 8 seconds 60-69 years: 11.4 seconds 70-79 years: 12.6 seconds 80-89 years: 14.8 seconds  MCID: >= 2.3 seconds for Vestibular Disorders (Meretta, 2006)  5 times sit to stand: 22s with hands - very unstable with poor hip, posture, core control  Timed up and go (TUG): 28s with SBQC - min A   07/15/24 37.10 sec (one stand with no UE support, 4 with B UE support and last two with min A upon standing)   GAIT: Distance walked: 200' Assistive device utilized: Quad cane small base Level of assistance: CGA Comments: Rt LE demonstrating ataxic pattern, Lt trunk lean intermittently  to right self, poor step height bil, decreased stride length, limited glute activation    06/16/24 266 ft with SPC, improved step height and step length, intermittent LOB if pt turns to speak to therapist, but pt able to right herself independently. Minimal trunk lean.                                                                                                                                TREATMENT DATE:   08/20/24:Pt arrives for aquatic physical therapy. Treatment took place in 3.5-5.5 feet of water . Water  temperature was 90 degrees f. Pt entered the pool via steps independently with use of bil rails, HHA to get from walker to stairs. Pt requires buoyancy of water  for support and to offload joints with strengthening exercises.  Pt utilizes viscosity of the water  required for strengthening. Seated water  bench with 75% submersion   -60% water  depth water  walking with Qtip 6x each direction with Vc for control of LE and really focusing on not circumducting her RTLE.  -static core with arm sway hold pink bells x20, then tandem stance 20x each side -Plank with RB hand weights, toes on floor  45 sec 3x -Static hip add/abd 15x Bil   08/18/24: Nustep level 7, LE 3/ UE 9 (green machine) x 6 min with PT present to  discuss status Stepping over orange obstacles x 4 laps, then bil sidestepping over obstacles x 1 each Standing hip ext with foot on slider x 10 R Bil HS stretch standing in // bars, then seated figure 4 piriformis stretch x 2 reps each, 20 sec each Free Motion Machine: Standing on purple balance pad for scap retract with 3# x 10, then bil UE ext x 10 with CBA-CGA throughout and VC's for core and gluts engaged for stability; then D2 with cable (machine arm at 5) and 3# x 5 reps each returning demo and standing on floor with SBA, encouraged core engagement and full trunk rotation   Front and retro heel-toe walking on airex x 2 laps each in // bars encouraging fingertips as able, then sidestepping x 2 laps Floor push ups x 4, also plank walkouts x 2 RW with red tband resistance tied to walker and R ankle for eccentric strengthening in stance phase and hip flexion assist in swing phase - walked around cancer gym and down to ortho gym - issued for home    08/15/24: Nustep level 5, LE 3/ UE 9 (green machine) x6 min with PT present to  discuss status In // bars for following: Front and retro heel-toe x 1 lap each with VC's to focus on foot placement as able; bil sidestepping x 1 rep each Stepping over orange obstacles x 4 laps, then bil sidestepping over obstacles x 1 each Bil HS stretch standing in // bars, then seated figure 4 piriformis stretch x 2 reps each, 20 sec each Free Motion Machine: Standing on purple balance pad for scap retract with 3# x 10, then bil UE ext x 10 with CBA-CGA throughout and VC's for core and gluts engaged for stability; then D2 with cable (machine arm at 5) and 3# x 5 reps each returning demo and standing on floor with SBA, encouraged core engagement and full trunk rotation   Front and retro heel-toe walking on airex x 2 laps each in // bars encouraging fingertips as able, then sidestepping x 2 laps Counter push ups x 10    08/13/24: Pt arrives for aquatic physical therapy. Treatment took place in 3.5-5.5 feet of water . Water  temperature was 90 degrees f. Pt entered the pool via steps independently with use of bil rails, HHA to get from walker to stairs. Pt requires buoyancy of water  for support and to offload joints with strengthening exercises.  Pt utilizes viscosity of the water  required for strengthening. Seated water  bench with 75% submersion     -Seated decompression to give LE moment to acclimate to water  and exercise.    -60% water  depth water  walking with Qtip 4x each direction with Vc for control of LE Did 6 lengths with side stepping to focus on adductors -static core with arm sway hold pink bells x20, then tandem stance 20x each side -Plank with RB hand weights, toes on floor  45 sec 3x -Static hip add/abd 15x Bil    PATIENT EDUCATION:  Education details: [email or web address] - uses App Person educated: Patient Education method: Explanation, Demonstration, Tactile cues, Verbal cues, and Handouts Education comprehension: verbalized understanding, returned demonstration, verbal cues required,  tactile cues required, and needs further education  HOME EXERCISE PROGRAM: Access Code: [email or web address] URL: [email or web address] Date: 07/07/2024 Prepared by: Mliss  Exercises - Supine Heel Slide with Strap  - 1 x daily - 7 x weekly - 3 sets - 10 reps - Pillow squeezes with kegel  - 1 x daily - 7  x weekly - 3 sets - 10 reps - Sit to Stand with Counter Support  - 1 x daily - 7 x weekly - 1 sets - 10 reps - Seated Long Arc Quad  - 1 x daily - 7 x weekly - 1-3 sets - 10 reps - 5 sec hold - Seated Isometric Hip Abduction with Resistance  - 1 x daily - 7 x weekly - 1-3 sets - 10 reps - Heel Raises with Counter Support  - 1 x daily - 7 x weekly - 1-3 sets - 10 reps - Standing Terminal Knee Extension at Wall with Ball  - 2 x daily - 7 x weekly - 2 sets - 10 reps - 5 sec hold - Squat with Chair Touch and Resistance Loop  - 1 x daily - 3 x weekly - 2 sets - 10 reps - Standing Knee Flexion with Counter Support  - 1 x daily - 3 x weekly - 2 sets - 10 reps - Supine Bridge with Resistance Band  - 1 x daily - 7 x weekly - 1-2 sets - 10 reps - 5 sec hold - Standing Hip Abduction on Slider  - 1 x daily - 3 x weekly - 2 sets - 10 reps - Calf stretch on rocker board  - 1 x daily - 7 x weekly - 1 sets - 3 reps - 30 sec  hold - Standing Anti-Rotation Press with Anchored Resistance  - 1 x daily - 3 x weekly - 2 sets - 10 reps - Standing Trunk Rotation with Resistance  - 1 x daily - 3 x weekly - 2 sets - 10 reps - Right Modified Dix Hallpike with Pillows  - 1 x daily - 1 x weekly - 1 sets - 1 reps - 30 sec hold  ASSESSMENT:  CLINICAL IMPRESSION: Pt complaints of hip pain are abolishing as she is concentrating more consistently on not circumducting her RTLE as much. We focused on good pelvic stabilization during her standing exercises. No pain.  Eval: Patient is a 58 y.o. female  who was seen today for physical therapy evaluation and tr in the teatment for poor gait mechanics, impaired posture,  impaired balance, decreased hip and core strength, increased fall risk. Pt has complex history of  Radiation-induced lumbosacral plexopathy and Polyradiculoneuropathy status post cervical cancer. Pt has completed pelvic floor PT recently and had positive outcomes with this and resolution of symptoms. Pt very motivated to participate in PT for bil LE strengthening and decreased fall risk. Pt demonstrated impairments with objective measurements of TUG and 5xSTS indicative of increased fall risk and balance deficits. Pt reports she is unable to hold and carry grandchildren, to walk on uneven surfaces, needs assistance with dressing, walking, transfers sometimes and wants to be more I. Pt would benefit from additional PT to further address deficits.    OBJECTIVE IMPAIRMENTS: Abnormal gait, decreased activity tolerance, decreased coordination, decreased endurance, decreased mobility, difficulty walking, decreased strength, increased fascial restrictions, increased muscle spasms, impaired flexibility, impaired sensation, improper body mechanics, postural dysfunction, and pain.   ACTIVITY LIMITATIONS: carrying, lifting, bending, sitting, standing, squatting, stairs, transfers, bed mobility, locomotion level, and caring for others  PARTICIPATION LIMITATIONS: meal prep, cleaning, laundry, interpersonal relationship, driving, shopping, community activity, occupation, and yard work  PERSONAL FACTORS: Fitness, Past/current experiences, Time since onset of injury/illness/exacerbation, and 1 comorbidity: medical history are also affecting patient's functional outcome.   REHAB POTENTIAL: Good  CLINICAL DECISION MAKING: Evolving/moderate complexity  EVALUATION COMPLEXITY:  Moderate   GOALS: Goals reviewed with patient? Yes  SHORT TERM GOALS: Target date: 02/13/24 Pt to be I with HEP for carry over and continuing recommendations for improved outcomes.   Baseline: Goal status: MET  2.  Pt to demonstrate at  least 3/5 Rt LE strength grossly and 4/5 Lt for improved pelvic stability and functional squats without falling.  Baseline:  Goal status: PARTIALLY MET 03/03/24  3.  Pt to demonstrate ability to complete static standing without AD for at least 5 mins at midline to complete gentle kitchen tasks/household tasks without fear of falling.  Baseline:  Goal status: MET  4.  Pt to demonstrate I with dynamic sitting balance to better be able to reach floor level for retrieving fallen objects and putting on socks without falling Baseline:  Goal status: MET 03/03/24  LONG TERM GOALS: Target date: 10/10/24  Pt to be I with advanced HEP for carry over and continuing recommendations for improved outcomes.   Baseline:  Goal status: ongoing  2.  Pt to report improved LEFS score to at least 30/80 for improved I with functional tasks at home such as dressing, walking from room to room.  Baseline:  Goal status: IN PROGRESS 28/80 06/16/24; 07/15/24  19 / 80 = 23.8 %  3.  Pt to demonstrate improved dynamic standing balance with or without AD at no more than CGA to better be able to play with grandchildren without falling.  Baseline:  Goal status: DEFERRED  4.  Pt to demonstrate improved gait mechanics with LRAD for 50 ' with minimal deviation from straight path to allow safe ambulation at home. Baseline:  Goal status: MODIFIED 07/15/24    5.  Pt to demonstrate ability to perform x5 Sit to stand with at least 10# to more safely assist in caring for grandchildren without risk of falling.  Baseline:  Goal status: DEFERRED  6.  Pt able to perform 3-5 sit to stands without UE assist showing improved functional LE strength Baseline: 1 in 30 sec on 07/15/24 Goal status: NEW  7.  Patient to report no falls when using AD at home or in the community. Baseline: using rolling walker at home and community (07/28/24) Goal status: Ongoing      PLAN:  PT FREQUENCY: 2x/week  PT DURATION: 12 weeks  PLANNED  INTERVENTIONS:    97110-Therapeutic exercises, 97530- Therapeutic activity, 97112- Neuromuscular re-education, 97535- Self Care, 02859- Manual therapy, [phone]- Gait training, [phone]- Canalith repositioning, [id number]- Aquatic Therapy, [id number]- Splinting, [id number]- Electrical stimulation (manual), [id number]- Vasopneumatic device, [id number]- Ultrasound, [id number]- Traction (mechanical), [id number]- Ionotophoresis 4mg /ml Dexamethasone , Patient/Family education, Balance training, Stair training, Taping, Dry Needling, Joint mobilization, Joint manipulation, Spinal manipulation, Spinal mobilization, Scar mobilization, DME instructions, Wheelchair mobility training, Cryotherapy, Moist heat, and Biofeedback  NEXT VISIT: for aquatic PT, Work on ADDuction, core and LE strengthening and neuromuscular re-ed improved gait and mobility LAND: continue with gait training with cane and walker, sit to stands, core and LE strength   Delon Darner, PTA 08/20/24 11:02 AM   [facility] [address] [place], [place] [address] Phone # [phone] Fax [phone]

## 2024-08-25 ENCOUNTER — Ambulatory Visit (INDEPENDENT_AMBULATORY_CARE_PROVIDER_SITE_OTHER): Admitting: Family Medicine

## 2024-08-25 ENCOUNTER — Encounter: Payer: Self-pay | Admitting: Obstetrics

## 2024-08-25 ENCOUNTER — Encounter: Payer: Self-pay | Admitting: Family Medicine

## 2024-08-25 VITALS — BP 126/69 | HR 61 | Ht 61.0 in | Wt 113.0 lb

## 2024-08-25 DIAGNOSIS — N319 Neuromuscular dysfunction of bladder, unspecified: Secondary | ICD-10-CM

## 2024-08-25 DIAGNOSIS — F411 Generalized anxiety disorder: Secondary | ICD-10-CM | POA: Diagnosis not present

## 2024-08-25 DIAGNOSIS — Z23 Encounter for immunization: Secondary | ICD-10-CM | POA: Diagnosis not present

## 2024-08-25 DIAGNOSIS — G5793 Unspecified mononeuropathy of bilateral lower limbs: Secondary | ICD-10-CM | POA: Diagnosis not present

## 2024-08-25 DIAGNOSIS — Z1231 Encounter for screening mammogram for malignant neoplasm of breast: Secondary | ICD-10-CM

## 2024-08-25 DIAGNOSIS — E7800 Pure hypercholesterolemia, unspecified: Secondary | ICD-10-CM

## 2024-08-25 DIAGNOSIS — Z87898 Personal history of other specified conditions: Secondary | ICD-10-CM

## 2024-08-25 DIAGNOSIS — E038 Other specified hypothyroidism: Secondary | ICD-10-CM | POA: Diagnosis not present

## 2024-08-25 DIAGNOSIS — R350 Frequency of micturition: Secondary | ICD-10-CM

## 2024-08-25 DIAGNOSIS — N3281 Overactive bladder: Secondary | ICD-10-CM

## 2024-08-25 DIAGNOSIS — I10 Essential (primary) hypertension: Secondary | ICD-10-CM | POA: Diagnosis not present

## 2024-08-25 MED ORDER — TAMSULOSIN HCL 0.4 MG PO CAPS: 0.4000 mg | ORAL_CAPSULE | Freq: Every day | ORAL | 3 refills | Status: AC

## 2024-08-25 MED ORDER — ESTRADIOL 0.01 % VA CREA: 0.5000 g | TOPICAL_CREAM | VAGINAL | 11 refills | Status: AC

## 2024-08-25 MED ORDER — GEMTESA 75 MG PO TABS: 1.0000 | ORAL_TABLET | Freq: Every day | ORAL | 3 refills | Status: AC

## 2024-08-25 NOTE — Progress Notes (Signed)
 Established Patient Office Visit  Patient ID: Anna Ramsey, female    DOB: [birth year]  Age: 58 y.o. MRN: [id number] PCP: Alvan Dorothyann JONETTA, MD  Chief Complaint  Patient presents with   Hypertension   mood    Subjective:     HPI  Discussed the use of AI scribe software for clinical note transcription with the patient, who gave verbal consent to proceed.  History of Present Illness Anna Ramsey is a 58 year old female who presents with worsening leg function and mobility issues.  Lower extremity weakness and impaired mobility - Significant decline in leg function since late September - Progressive worsening over several weeks, initially attributed to weather - Loss of function in left leg with difficulty initiating movement - Debilitating impact on mobility and daily activities - Adapted by using a collapsible purple rollator for improved navigation at home - Attends physical therapy twice weekly, including land and aqua therapy  Blood pressure monitoring - Monitors blood pressure daily, both morning and evening - No unusual highs or lows reported  General constitutional symptoms - No recent changes in skin, hair, or energy levels  Cancer screening and insurance issues - Due for mammogram - Recent insurance issue related to previous cervical cancer diagnosis, requiring correction of diagnosis code for Pap smear - Plans to schedule mammogram now that insurance issue is resolved     ROS    Objective:     BP 126/69   Pulse 61   Ht 5' 1 (1.549 m)   Wt 113 lb (51.3 kg)   LMP 08/19/2013   SpO2 99%   BMI 21.35 kg/m    Physical Exam Vitals and nursing note reviewed.  Constitutional:      Appearance: Normal appearance.  HENT:     Head: Normocephalic and atraumatic.  Eyes:     Conjunctiva/sclera: Conjunctivae normal.  Cardiovascular:     Rate and Rhythm: Normal rate and regular rhythm.  Pulmonary:     Effort: Pulmonary  effort is normal.     Breath sounds: Normal breath sounds.  Skin:    General: Skin is warm and dry.  Neurological:     Mental Status: She is alert.  Psychiatric:        Mood and Affect: Mood normal.      No results found for any visits on 08/25/24.    The 10-year ASCVD risk score (Arnett DK, et al., 2019) is: 2.6%    Assessment & Plan:   Problem List Items Addressed This Visit       Cardiovascular and Mediastinum   HYPERTENSION, BENIGN - Primary   Relevant Orders   TSH   CMP14+EGFR   Lipid Panel With LDL/HDL Ratio   CBC with Differential     Endocrine   Hypothyroid   Relevant Orders   TSH   CMP14+EGFR   Lipid Panel With LDL/HDL Ratio   CBC with Differential     Nervous and Auditory   Neuropathy involving both lower extremities   Following with Neurology, getting PT,       Relevant Orders   TSH   CMP14+EGFR   Lipid Panel With LDL/HDL Ratio   CBC with Differential     Other   Neurogenic bladder   Generalized anxiety disorder   Relevant Orders   TSH   CMP14+EGFR   Lipid Panel With LDL/HDL Ratio   CBC with Differential   Other Visit Diagnoses       Encounter for screening mammogram  for malignant neoplasm of breast       Relevant Orders   MM 3D SCREENING MAMMOGRAM BILATERAL BREAST     Elevated LDL cholesterol level       Relevant Orders   TSH   CMP14+EGFR   Lipid Panel With LDL/HDL Ratio   CBC with Differential     Encounter for immunization       Relevant Orders   Flu vaccine trivalent PF, 6mos and older(Flulaval,Afluria,Fluarix,Fluzone) (Completed)       Assessment and Plan Assessment & Plan Essential hypertension Blood pressure well-controlled. - Continue current antihypertensive regimen.  Other specified hypothyroidism Thyroid function stable. - Continue current thyroid medication regimen.  Unspecified mononeuropathy of bilateral lower limbs Worsening symptoms in left leg with functional impairment. Using rollator and engaged in  physical therapy. - Continue physical therapy twice a week. - Consider starting Pilates for additional resistance training.  Screening mammogram for malignant neoplasm of breast Due for mammogram. Insurance coding issues delayed scheduling. - Schedule mammogram after insurance issues are resolved.  General Health Maintenance Discussed importance of flu vaccination before travel. - Receive flu shot before travel.    Return in about 6 months (around 02/23/2025) for Hypertension.    Dorothyann Byars, MD [facility]

## 2024-08-25 NOTE — Assessment & Plan Note (Signed)
 Following with Neurology, getting PT,

## 2024-08-26 LAB — CBC WITH DIFFERENTIAL/PLATELET
Basophils Absolute: 0.1 x10E3/uL (ref 0.0–0.2)
Basos: 1 %
EOS (ABSOLUTE): 0.2 x10E3/uL (ref 0.0–0.4)
Eos: 4 %
Hematocrit: 46.3 % (ref 34.0–46.6)
Hemoglobin: 14.9 g/dL (ref 11.1–15.9)
Immature Grans (Abs): 0 x10E3/uL (ref 0.0–0.1)
Immature Granulocytes: 0 %
Lymphocytes Absolute: 1.1 x10E3/uL (ref 0.7–3.1)
Lymphs: 22 %
MCH: 32.3 pg (ref 26.6–33.0)
MCHC: 32.2 g/dL (ref 31.5–35.7)
MCV: 100 fL — ABNORMAL HIGH (ref 79–97)
Monocytes Absolute: 0.5 x10E3/uL (ref 0.1–0.9)
Monocytes: 10 %
Neutrophils Absolute: 3.2 x10E3/uL (ref 1.4–7.0)
Neutrophils: 63 %
Platelets: 278 x10E3/uL (ref 150–450)
RBC: 4.62 x10E6/uL (ref 3.77–5.28)
RDW: 13.2 % (ref 11.7–15.4)
WBC: 5.1 x10E3/uL (ref 3.4–10.8)

## 2024-08-26 LAB — LIPID PANEL WITH LDL/HDL RATIO
Cholesterol, Total: 203 mg/dL — ABNORMAL HIGH (ref 100–199)
HDL: 60 mg/dL (ref >39)
LDL Chol Calc (NIH): 122 mg/dL — ABNORMAL HIGH (ref 0–99)
LDL/HDL Ratio: 2 ratio (ref 0.0–3.2)
Triglycerides: 119 mg/dL (ref 0–149)
VLDL Cholesterol Cal: 21 mg/dL (ref 5–40)

## 2024-08-26 LAB — CMP14+EGFR
ALT: 33 IU/L — ABNORMAL HIGH (ref 0–32)
AST: 27 IU/L (ref 0–40)
Albumin: 4.3 g/dL (ref 3.8–4.9)
Alkaline Phosphatase: 132 IU/L (ref 49–135)
BUN/Creatinine Ratio: 17 (ref 9–23)
BUN: 11 mg/dL (ref 6–24)
Bilirubin Total: <0.2 mg/dL (ref 0.0–1.2)
CO2: 24 mmol/L (ref 20–29)
Calcium: 9.3 mg/dL (ref 8.7–10.2)
Chloride: 103 mmol/L (ref 96–106)
Creatinine, Ser: 0.65 mg/dL (ref 0.57–1.00)
Globulin, Total: 2.5 g/dL (ref 1.5–4.5)
Glucose: 88 mg/dL (ref 70–99)
Potassium: 4.9 mmol/L (ref 3.5–5.2)
Sodium: 141 mmol/L (ref 134–144)
Total Protein: 6.8 g/dL (ref 6.0–8.5)
eGFR: 104 mL/min/1.73 (ref >59)

## 2024-08-26 LAB — TSH: TSH: 5.75 u[IU]/mL — ABNORMAL HIGH (ref 0.450–4.500)

## 2024-08-26 NOTE — Progress Notes (Unsigned)
 NEUROLOGY FOLLOW UP OFFICE NOTE  THERESEA TRAUTMANN [id number]  Assessment/Plan:   Radiation-induced lumbosacral plexopathy ***    ***    Subjective:  Anna Ramsey. Anna Ramsey is a 58 year old female with HTN, thyroid disease, and history of cervical cancer whom I see for radiation-induced lumbosacral plexopathy presents for new symptoms.  She is accompanied by her husband who supplements history.   UPDATE: ***  Current antihypertensive:  metoprolol  tartrate, amlodipine  Current antidepressant:  paroxetine  40mg  daily, trazodone  50mg  at bedtime (insomnia) Current antiepileptic: pregablin 100mg  twice daily Other medications:  levothyroxine  Current vitamins/supplements:  B12  HISTORY: Patient has history of cervical cancer status post hysterectomy and pelvic radiation in 2015.  Around 2018, she sprained her right ankle.  She began noticing tingling and numbness on the bottom of her right foot that spread up her leg and then involved her left leg.  The numbness typically radiated up to her knees but sometimes it extends to the waist.  Her legs feel like cement and struggles to lift her legs.  Sometimes her right leg may give out.  History states chronic low back pain with radiculopathy but she denies back pain or radicular pain in the legs.  She has undergone epidural injections in the low back which has not helped the numbness.  She denies neuropathic pain such as burning, stabbing or shocks .  She also had some cognitive changes, finding it more difficulty to multitask.  She was found to have hypothyroidism which has since been treated.  Other labs checked included B12 540, folate 6.2, B1 8, ferritin 128, Mg 2.1 and mildly elevated B6 of 29.2.  MRI of lumbar spine on 08/01/2017 showed levoscoliosis without any significant disc herniation, spinal or neuroforaminal stenosis.  She had another MRI of lumbar spine on 01/05/2020 which was also unremarkable and negative for any explanation.  MRI  of cervical and thoracic spine in August 2021 showed right sided foraminal stenosis at C6-7 but otherwise unremarkable.  She started experiencing numbness and tingling in the mouth  MRI of brain without contrast on 01/05/2020 was normal.  She has been evaluated by neurology in [facility].  She underwent a NCV-EMG that reportedly showed mild to moderate peripheral neuropathy.  I saw patient in 2022.  Repeat NCV-EMG of lower extremities showed no evidence of polyneuropathy but did demonstrate findings suggestive of right worse than left L3-4 radiculopathy.  As imaging of the lumbar spine did not reveal any structural cause, she underwent LP for CSF analysis of an inflammatory etiology such as CIDP, but protein was normal.  CSF cell count 1, protein 31, glucose 52, gram stain with rare WBC but no organisms, cytology with no malignant cells, Lyme IgG/IgM negative, ACE 2, 3 oligoclonal bands (also detected in serum), IgG index 0.55.  She had a repeat NCV-EMG on 02/09/2023 which showed interval worsening of sensory responses and significant progression of neurogenic changes in the lower extremities when compared to prior study from 12/29/2020, consistent with an active on chronic polyradiculoneuropathy.  She had a repeat LP on 6/3 which again was unremarkable with normal protein (cell count 2, protein 36, glucose 58, negative cytology, negative gram stain and culture, negative VDRL, ACE 8, negative oligoclonal bands, negative Lyme PCR.  Serum sensory (+/- motor) neuropathy panel from [facility] was negative (including anti-GD1a, anti-GD1b, anti-GM1, anti-MAG).  By August 2024, she was experiencing urinary retention.  Seen in the ED.  CT A/P with contrast revealed mild to moderate volume ascites and  mild diffuse urinary bladder wall thickening as well as colonic diverticulosis.  Prescribed Cipro  for presumed cystitis.  She was seen in the [facility] in September.  Questioned 2 separate processes:   Carpal tunnel syndrome in the upper the extremities and a radiation induced lumbosacral plexopathy.  Underwent further testing.  Labs included Hgb A1c 5.5, B12 318, MMA 0.18, B6 31.1, copper 101, elevated vit E 26.8, SPEP/IFE negative paraprotein/monoclonal component, kappa/gamma light chains normal, total protein 6.7, ANA negative, borderline elevated RF 14, ESR 1, CRP 0.11 and CCP <0.5.  NCV-EMG on 06/11/2023 confirmed bilateral lumbosacral plexopathies (right worse than left) with myokymia supporting diagnosis of radiation-induced lumbosacral plexopathy.  Abnormalities in the right FDI and APB thought to be a chronic C8-T1 radiculopathy on the right without active denervation.  No definitive electrodiagnostic evidence for a large fiber polyneuropathy or multilevel bilateral lumbosacral radiculopathies.  In August 2024, she had an episode of severe pelvic pain that caused her to not be able to urinate.  She had an MRI of the C-spine without contrast on 07/28/2023 which showed right central C5-C6 disc protrusion indenting the ventral spinal cord but no compression or definite neuroforaminal stenosis.  MRI of pelvis with and without contrast showed enlargement and increased signal within the lumbosacral nerve roots with scattered muscular denervation changes, right worse than left, correlating with lumbar plexopathy.     No family history of progressive neuropathy.  PAST MEDICAL HISTORY: Past Medical History:  Diagnosis Date   ADHD (attention deficit hyperactivity disorder) 2024   Dysuria    Erythematous bladder mucosa    Heart murmur    asymptomatic per pt --  no echo   Hematuria    History of cervical cancer    12/ 2014  Stage IB2--  s/p  TAH w/ BSO and pelvic lymphadectomy/  and radiation therapy   History of radiation therapy 10/20/13-11/28/13   pelvis 50.4 gray   Hypertension    Lesion of bladder    Mild intermittent asthma    Neuromuscular disorder (HCC)    Seasonal allergies    Thyroid  disease     MEDICATIONS: Current Outpatient Medications on File Prior to Visit  Medication Sig Dispense Refill   albuterol  (VENTOLIN  HFA) 108 (90 Base) MCG/ACT inhaler Inhale 1-2 puffs into the lungs every 6 (six) hours as needed for wheezing or shortness of breath. 36 g 2   AMBULATORY NON FORMULARY MEDICATION Medication Name: R custom ankle foot orthoses. DX: M62.81,M21.371 [facility] fax: [phone] 1 each 0   amLODipine  (NORVASC ) 5 MG tablet Take 1 tablet (5 mg total) by mouth daily. 90 tablet 1   baclofen  (LIORESAL ) 10 MG tablet Take 1 tablet (10 mg total) by mouth 3 (three) times daily as needed for muscle spasms. 30 each 0   cetirizine (ZYRTEC) 10 MG tablet Take 10 mg by mouth at bedtime. (Patient taking differently: Take 10 mg by mouth as needed.)     cyanocobalamin (VITAMIN B12) 1000 MCG tablet Take 1,000 mcg by mouth daily.     [START ON 08/28/2024] estradiol  (ESTRACE ) 0.01 % CREA vaginal cream Place 0.5 g vaginally 2 (two) times a week. 42.5 g 11   fluticasone  (FLONASE ) 50 MCG/ACT nasal spray Place 2 sprays into both nostrils daily as needed.      levothyroxine  (SYNTHROID ) 88 MCG tablet Take 1 tablet (88 mcg total) by mouth daily. 90 tablet 0   metoprolol  tartrate (LOPRESSOR ) 100 MG tablet Take 1 tablet by mouth twice daily 180 tablet  0   PARoxetine  (PAXIL ) 40 MG tablet Take 1 tablet (40 mg total) by mouth every morning. 90 tablet 1   pregabalin  (LYRICA ) 100 MG capsule Take 1 capsule by mouth twice daily 180 capsule 0   tamsulosin  (FLOMAX ) 0.4 MG CAPS capsule Take 1 capsule (0.4 mg total) by mouth daily. 90 capsule 3   traZODone  (DESYREL ) 50 MG tablet Take 1 tablet (50 mg total) by mouth at bedtime. 90 tablet 1   Vibegron  (GEMTESA ) 75 MG TABS Take 1 tablet (75 mg total) by mouth daily. 90 tablet 3   Current Facility-Administered Medications on File Prior to Visit  Medication Dose Route Frequency Provider Last Rate Last Admin   lidocaine  (XYLOCAINE ) 2 % jelly 1 Application  1  Application Urethral Once         ALLERGIES: Allergies  Allergen Reactions   Sulfa Antibiotics Other (See Comments)    Numbness in lower extremities from knees down   Cefdinir    Penicillins Other (See Comments)    Unknown childhood reaction    FAMILY HISTORY: Family History  Problem Relation Age of Onset   Heart attack Father 18   Diabetes Father    Hypertension Maternal Grandmother    Stroke Maternal Grandmother    Cancer Mother        breast/thyroid      Objective:  *** General: No acute distress.  Patient appears well-groomed.   Head:  Normocephalic/atraumatic Eyes:  Fundi examined but not visualized Neck: supple, no paraspinal tenderness, full range of motion Heart:  Regular rate and rhyth Neurological Exam: alert and oriented.  Speech fluent and not dysarthric, language intact.  CN II-XII intact. Bulk and tone normal, muscle strength 4-/5 bilateral hip flexion, 4+/5 right knee extension, bilateral ankle dorsiflexion and bilateral EHLs, 5-/5 left knee extension, otherwise, 5/5  Sensation to pinprick reduced in feet up to above right knee and just below left knee, vibratory sensation reduced in bilateral lower extremities, more severe on the right.  Deep tendon reflexes 2+ upper extremities, absent lower extremities, toes downgoing.  Finger to nose testing intact.  Able to ambulate without cane, steppage gait.  Romberg with sway. ***   Juliene Dunnings, DO  CC: Dorothyann Byars, MD

## 2024-08-27 ENCOUNTER — Ambulatory Visit (INDEPENDENT_AMBULATORY_CARE_PROVIDER_SITE_OTHER): Admitting: Neurology

## 2024-08-27 ENCOUNTER — Encounter: Payer: Self-pay | Admitting: Neurology

## 2024-08-27 ENCOUNTER — Ambulatory Visit: Attending: Neurology | Admitting: Physical Therapy

## 2024-08-27 ENCOUNTER — Encounter: Payer: Self-pay | Admitting: Physical Therapy

## 2024-08-27 VITALS — BP 136/87 | HR 65 | Ht 61.0 in | Wt 113.0 lb

## 2024-08-27 DIAGNOSIS — R269 Unspecified abnormalities of gait and mobility: Secondary | ICD-10-CM | POA: Insufficient documentation

## 2024-08-27 DIAGNOSIS — M62838 Other muscle spasm: Secondary | ICD-10-CM | POA: Insufficient documentation

## 2024-08-27 DIAGNOSIS — G541 Lumbosacral plexus disorders: Secondary | ICD-10-CM | POA: Diagnosis not present

## 2024-08-27 DIAGNOSIS — R279 Unspecified lack of coordination: Secondary | ICD-10-CM | POA: Diagnosis present

## 2024-08-27 DIAGNOSIS — M6281 Muscle weakness (generalized): Secondary | ICD-10-CM | POA: Diagnosis present

## 2024-08-27 DIAGNOSIS — R102 Pelvic and perineal pain unspecified side: Secondary | ICD-10-CM | POA: Insufficient documentation

## 2024-08-27 DIAGNOSIS — R293 Abnormal posture: Secondary | ICD-10-CM | POA: Insufficient documentation

## 2024-08-27 NOTE — Therapy (Signed)
 OUTPATIENT PHYSICAL THERAPY TREATMENT NOTE   Patient Name: Anna Ramsey MRN: [id number] DOB:Apr 12, 1966, 58 y.o., female Today's Date: 08/27/2024  END OF SESSION:  PT End of Session - 08/27/24 0758     Visit Number 49    Date for Recertification  10/10/24    Authorization Type [employer]    PT Start Time [phone]    PT Stop Time 0845    PT Time Calculation (min) 47 min    Activity Tolerance Patient tolerated treatment well    Behavior During Therapy [facility] for tasks assessed/performed                 Past Medical History:  Diagnosis Date   ADHD (attention deficit hyperactivity disorder) 2024   Dysuria    Erythematous bladder mucosa    Heart murmur    asymptomatic per pt --  no echo   Hematuria    History of cervical cancer    12/ 2014  Stage IB2--  s/p  TAH w/ BSO and pelvic lymphadectomy/  and radiation therapy   History of radiation therapy 10/20/13-11/28/13   pelvis 50.4 gray   Hypertension    Lesion of bladder    Mild intermittent asthma    Neuromuscular disorder (HCC)    Seasonal allergies    Thyroid disease    Past Surgical History:  Procedure Laterality Date   CYSTOSCOPY WITH BIOPSY N/A 04/29/2015   Procedure: CYSTOSCOPY WITH BIOPSY WITH FULGERATION;  Surgeon: Belvie LITTIE Clara, MD;  Location: [facility];  Service: Urology;  Laterality: N/A;  1 HR [phone] [id number]   FOOT SURGERY Left 2010   RADICAL ABDOMINAL HYSTERECTOMY  09-05-2013   w/ BILATERAL SALPINGOOPHORECTOMY AND PELVIC LYMPHADECTOMY   Patient Active Problem List   Diagnosis Date Noted   ADD (attention deficit disorder) 04/21/2024   SUI (stress urinary incontinence, female) 11/01/2023   History of urinary retention 11/01/2023   Neurogenic bladder 10/18/2023   Hematuria 10/18/2023   Stress and adjustment reaction 01/13/2021   Inattention 10/28/2019   Hypothyroid 11/13/2016   Neuropathy involving both lower extremities 11/13/2016   Cystitis, radiation 05/22/2014    Cervical cancer (HCC) 09/01/2013   Adenocarcinoma of cervix, 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   Asthma, mild intermittent 07/07/2009   INSOMNIA 05/28/2008   Generalized anxiety disorder 05/07/2008   HYPERTENSION, BENIGN 05/31/2007    PCP: Geraline Dorothyann JONETTA, MD  REFERRING PROVIDER: Skeet Juliene SAUNDERS, DO   REFERRING DIAG:  [phone] (ICD-10-CM) - Bilateral leg weakness G62.89 (ICD-10-CM) - 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 I leave for [place] to visit my brother this Sunday so I am figuring out all my logistics with the wheelchair vs the walker. Pain has been low.   Eval: Right leg worse than left but both painful. Right has instances of knee just giving out, has neuropathy - like weakness, pain since radiation in 2015. The nerves have been affected and greatly limiting her activity. Does have symptoms of numbness, sensory deficits, weakness, ataxic gait, and painful but all vary based on the day.     Fluid intake: Yes: water    PAIN: 08/27/2024  Are you having pain? Leg/foot feels heavy/tingly NPRS scale: 0/10, just numb and tingly Pain location: bil legs Right leg Pain type: aching Pain description: constant   Aggravating factors: walking, end of day, rainy days Relieving factors: try not to walk, decreasing activities    PRECAUTIONS: Other: cervical cancer with radiation  RED FLAGS: None   WEIGHT BEARING RESTRICTIONS: No  FALLS:   Has patient fallen in last 6 months? Yes. Number of falls 1x/weekly due to weakness and decreased balance from having the radiation, trouble with low light - reports she does need to furniture walk to help with balance  LIVING ENVIRONMENT: Lives with: lives with their family  OCCUPATION: sits with laptop on her lap for 12 hours per day; unable to sit with legs down due to them falling asleep  PLOF: Independent  PATIENT GOALS: to strengthening enough to walk across grass, wants to have better balance, stronger legs                                        PERTINENT HISTORY:  ADHD; Cervical Cancer 2014; 9 weeks of radiation; Radical hysterectomy;    OBJECTIVE:  Note: Objective measures were completed at Evaluation unless otherwise noted.  DIAGNOSTIC FINDINGS:   PATIENT SURVEYS:  Lower Extremity Functional Score: 5 / 80 = 6.3 % 06/16/24 LEFS 28 / 80 = 35.0 % 07/15/24  19 / 80 = 23.8 %  COGNITION: Overall cognitive status: Within functional limits for tasks assessed     SENSATION: Poor sensation at Rt LE - light touch felt along posterior gastroc only no awareness without visualization of being touched at medial, lateral, anterior lower leg, reports numbness in bil feet Rt>Lt  EDEMA:  Rt ankle sometimes swells per pt  MUSCLE LENGTH: Bil hamstrings and adductors limited by 25%;  POSTURE: rounded shoulders and forward head  LOWER EXTREMITY ROM:  weakness and poor coordination/proprioception   LOWER EXTREMITY MMT:  02/04/24 hip flexion was R 3/5, L 4/5  MMT Right Eval */5 Left Eval */5 Right 5/16 Left 5/16 RIGHT 6/6 LEFT  6/6 Right 10/21 Left  10/21  Hip flexion 2 3 4+ 5 3  3  4+  Hip extension 2 3 4+ 4 4+ 4 3+ 4  Hip abduction 3 3+ 5- 5 5     Hip adduction 1+ 2+ 2+ 2+ 2+ 2+ 2+ 2+  Hip internal rotation          Hip external rotation          Knee flexion 2 3 3- 4 3+ 5 2+ 4  Knee extension 2+ 3 3- 5 3+     Ankle dorsiflexion 0 4 0 4+ long sitting 2 4  4+ long  sitting 2      Ankle plantarflexion 3 4  Ankle inversion 1 4 3+ tested in long sitting 4+ tested in long sitting 3+ in long sitting 5 in long sitting    Ankle eversion 2 4 3+ tested in long sitting 4- tested in long sitting 2+ 4- tested in long sitting     (Blank rows = not tested)   FUNCTIONAL TESTS:  Five times Sit to Stand Test (FTSS) Method: Use a straight back chair with a solid seat that is 16-18" high. Ask participant to sit on the chair with arms folded across their chest.   Instructions: "Stand up and sit down as quickly as possible 5 times, keeping your arms folded across your chest."   Measurement: Stop timing when the participant stands the 5th time.  TIME: ______ (in seconds)  Times > 13.6 seconds is associated with increased disability and morbidity (Guralnik, 2000) Times > 15 seconds is predictive of recurrent falls in healthy individuals aged 23 and older (Buatois, et al., 2008) Normal performance values in community dwelling individuals aged 39 and older (Bohannon, 2006): 50-59 years: 8 seconds 60-69 years: 11.4 seconds 70-79 years: 12.6 seconds 80-89 years: 14.8 seconds  MCID: >= 2.3 seconds for Vestibular Disorders (Meretta, 2006)  5 times sit to stand: 22s with hands - very unstable with poor hip, posture, core control  Timed up and go (TUG): 28s with SBQC - min A   07/15/24 37.10 sec (one stand with no UE support, 4 with B UE support and last two with min A upon standing)   GAIT: Distance walked: 200' Assistive device utilized: Quad cane small base Level of assistance: CGA Comments: Rt LE demonstrating ataxic pattern, Lt trunk lean intermittently  to right self, poor step height bil, decreased stride length, limited glute activation    06/16/24 266 ft with SPC, improved step height and step length, intermittent LOB if pt turns to speak to therapist, but pt able to right herself independently. Minimal trunk lean.                                                                                                                                TREATMENT DATE:   08/27/24:Pt arrives for aquatic physical therapy. Treatment took place in 3.5-5.5 feet of water . Water  temperature was 90 degrees f. Pt entered the pool via steps independently with use of bil rails, HHA to get from walker to stairs. Pt requires buoyancy of water  for support and to offload joints with strengthening exercises.  Pt utilizes viscosity of the water  required for strengthening. Seated water  bench with 75% submersion: forward facing plank: knees in & out 10x f/b bicycle 30 sec. Repeated 2x.    -60% water  depth water  walking with Qtip 6x each direction with Vc for control of LE and really focusing on not circumducting her RTLE.  -static core with arm sway hold pink bells x20, then tandem stance 20x each side -forward facing plank on water  bench; knees in and out 10x  then bicycle 30 sec. Rest, repeat 2x more.VC for hips up -Plank with RB hand weights, toes on floor  1 min 3x -Static hip add/abd 15x2 Bil  08/20/24:Pt arrives for aquatic physical therapy. Treatment took place in 3.5-5.5 feet of water . Water  temperature was 90 degrees f. Pt entered the pool via steps independently with use of bil rails, HHA to get from walker to stairs. Pt requires buoyancy of water  for support and to offload joints with strengthening exercises.  Pt utilizes viscosity of the water  required for strengthening. Seated water  bench with 75% submersion   -60% water  depth water  walking with Qtip 6x each direction with Vc for control of LE and really focusing on not circumducting her RTLE.  -static core with arm sway hold pink bells x20, then tandem stance 20x each side -Plank with RB hand weights, toes on floor  45 sec 3x -Static hip add/abd 15x Bil   08/18/24: Nustep level 7, LE 3/ UE 9 (green machine) x 6 min with PT present to discuss status Stepping over orange obstacles x 4 laps, then bil sidestepping  over obstacles x 1 each Standing hip ext with foot on slider x 10 R Bil HS stretch standing in // bars, then seated figure 4 piriformis stretch x 2 reps each, 20 sec each Free Motion Machine: Standing on purple balance pad for scap retract with 3# x 10, then bil UE ext x 10 with CBA-CGA throughout and VC's for core and gluts engaged for stability; then D2 with cable (machine arm at 5) and 3# x 5 reps each returning demo and standing on floor with SBA, encouraged core engagement and full trunk rotation   Front and retro heel-toe walking on airex x 2 laps each in // bars encouraging fingertips as able, then sidestepping x 2 laps Floor push ups x 4, also plank walkouts x 2 RW with red tband resistance tied to walker and R ankle for eccentric strengthening in stance phase and hip flexion assist in swing phase - walked around cancer gym and down to ortho gym - issued for home      PATIENT EDUCATION:  Education details: [email or web address] - uses App Person educated: Patient Education method: Explanation, Demonstration, Tactile cues, Verbal cues, and Handouts Education comprehension: verbalized understanding, returned demonstration, verbal cues required, tactile cues required, and needs further education  HOME EXERCISE PROGRAM: Access Code: [email or web address] URL: [email or web address] Date: 07/07/2024 Prepared by: Mliss  Exercises - Supine Heel Slide with Strap  - 1 x daily - 7 x weekly - 3 sets - 10 reps - Pillow squeezes with kegel  - 1 x daily - 7 x weekly - 3 sets - 10 reps - Sit to Stand with Counter Support  - 1 x daily - 7 x weekly - 1 sets - 10 reps - Seated Long Arc Quad  - 1 x daily - 7 x weekly - 1-3 sets - 10 reps - 5 sec hold - Seated Isometric Hip Abduction with Resistance  - 1 x daily - 7 x weekly - 1-3 sets - 10 reps - Heel Raises with Counter Support  - 1 x daily - 7 x weekly - 1-3 sets - 10 reps - Standing Terminal Knee Extension at Wall with Ball  - 2 x daily - 7 x weekly - 2  sets - 10 reps - 5 sec hold - Squat with Chair Touch and Resistance Loop  - 1 x daily - 3 x weekly - 2 sets - 10 reps -  Standing Knee Flexion with Counter Support  - 1 x daily - 3 x weekly - 2 sets - 10 reps - Supine Bridge with Resistance Band  - 1 x daily - 7 x weekly - 1-2 sets - 10 reps - 5 sec hold - Standing Hip Abduction on Slider  - 1 x daily - 3 x weekly - 2 sets - 10 reps - Calf stretch on rocker board  - 1 x daily - 7 x weekly - 1 sets - 3 reps - 30 sec  hold - Standing Anti-Rotation Press with Anchored Resistance  - 1 x daily - 3 x weekly - 2 sets - 10 reps - Standing Trunk Rotation with Resistance  - 1 x daily - 3 x weekly - 2 sets - 10 reps - Right Modified Dix Hallpike with Pillows  - 1 x daily - 1 x weekly - 1 sets - 1 reps - 30 sec hold  ASSESSMENT:  CLINICAL IMPRESSION: Pt reports having  good week; pain low and managble. She is currently working through logistics regaridng her ambulation as she travels internationally. Added forward facing planks today with various LE tasks that focus on full body stabiliazation  and hip Rom and strength.   Eval: Patient is a 58 y.o. female  who was seen today for physical therapy evaluation and tr in the teatment for poor gait mechanics, impaired posture, impaired balance, decreased hip and core strength, increased fall risk. Pt has complex history of  Radiation-induced lumbosacral plexopathy and Polyradiculoneuropathy status post cervical cancer. Pt has completed pelvic floor PT recently and had positive outcomes with this and resolution of symptoms. Pt very motivated to participate in PT for bil LE strengthening and decreased fall risk. Pt demonstrated impairments with objective measurements of TUG and 5xSTS indicative of increased fall risk and balance deficits. Pt reports she is unable to hold and carry grandchildren, to walk on uneven surfaces, needs assistance with dressing, walking, transfers sometimes and wants to be more I. Pt would  benefit from additional PT to further address deficits.    OBJECTIVE IMPAIRMENTS: Abnormal gait, decreased activity tolerance, decreased coordination, decreased endurance, decreased mobility, difficulty walking, decreased strength, increased fascial restrictions, increased muscle spasms, impaired flexibility, impaired sensation, improper body mechanics, postural dysfunction, and pain.   ACTIVITY LIMITATIONS: carrying, lifting, bending, sitting, standing, squatting, stairs, transfers, bed mobility, locomotion level, and caring for others  PARTICIPATION LIMITATIONS: meal prep, cleaning, laundry, interpersonal relationship, driving, shopping, community activity, occupation, and yard work  PERSONAL FACTORS: Fitness, Past/current experiences, Time since onset of injury/illness/exacerbation, and 1 comorbidity: medical history are also affecting patient's functional outcome.   REHAB POTENTIAL: Good  CLINICAL DECISION MAKING: Evolving/moderate complexity  EVALUATION COMPLEXITY: Moderate   GOALS: Goals reviewed with patient? Yes  SHORT TERM GOALS: Target date: 02/13/24 Pt to be I with HEP for carry over and continuing recommendations for improved outcomes.   Baseline: Goal status: MET  2.  Pt to demonstrate at least 3/5 Rt LE strength grossly and 4/5 Lt for improved pelvic stability and functional squats without falling.  Baseline:  Goal status: PARTIALLY MET 03/03/24  3.  Pt to demonstrate ability to complete static standing without AD for at least 5 mins at midline to complete gentle kitchen tasks/household tasks without fear of falling.  Baseline:  Goal status: MET  4.  Pt to demonstrate I with dynamic sitting balance to better be able to reach floor level for retrieving fallen objects and putting on socks without falling Baseline:  Goal status: MET 03/03/24  LONG TERM GOALS: Target date: 10/10/24  Pt to be I with advanced HEP for carry over and continuing recommendations for improved  outcomes.   Baseline:  Goal status: ongoing  2.  Pt to report improved LEFS score to at least 30/80 for improved I with functional tasks at home such as dressing, walking from room to room.  Baseline:  Goal status: IN PROGRESS 28/80 06/16/24; 07/15/24  19 / 80 = 23.8 %  3.  Pt to demonstrate improved dynamic standing balance with or without AD at no more than CGA to better be able to play with grandchildren without falling.  Baseline:  Goal status: DEFERRED  4.  Pt to demonstrate improved gait mechanics with LRAD for 50 ' with minimal deviation from straight path to allow safe ambulation at home. Baseline:  Goal status: MODIFIED 07/15/24    5.  Pt to demonstrate ability to perform x5 Sit to stand with at least 10# to more safely assist in caring for grandchildren without risk of falling.  Baseline:  Goal status: DEFERRED  6.  Pt able to perform 3-5 sit to stands without UE assist showing improved functional LE strength Baseline: 1 in 30 sec on 07/15/24 Goal status: NEW  7.  Patient to report no falls when using AD at home or in the community. Baseline: using rolling walker at home and community (07/28/24) Goal status: Ongoing      PLAN:  PT FREQUENCY: 2x/week  PT DURATION: 12 weeks  PLANNED INTERVENTIONS:    97110-Therapeutic exercises, 97530- Therapeutic activity, 97112- Neuromuscular re-education, 97535- Self Care, 02859- Manual therapy, [phone]- Gait training, [id number]- Canalith repositioning, [id number]- Aquatic Therapy, [phone]- Splinting, [id number]- Electrical stimulation (manual), [id number]- Vasopneumatic device, [id number]- Ultrasound, [id number]- Traction (mechanical), [id number]- Ionotophoresis 4mg /ml Dexamethasone , Patient/Family education, Balance training, Stair training, Taping, Dry Needling, Joint mobilization, Joint manipulation, Spinal manipulation, Spinal mobilization, Scar mobilization, DME instructions, Wheelchair mobility training, Cryotherapy, Moist heat, and Biofeedback  NEXT VISIT: for  aquatic PT, Work on ADDuction, core and LE strengthening and neuromuscular re-ed improved gait and mobility LAND: continue with gait training with cane and walker, sit to stands, core and LE strength   Delon Darner, PTA 08/27/24 8:48 AM   [facility] [address] [place], [place] [address] Phone # [phone] Fax [phone]

## 2024-08-28 ENCOUNTER — Encounter: Payer: Self-pay | Admitting: Family Medicine

## 2024-08-28 DIAGNOSIS — E038 Other specified hypothyroidism: Secondary | ICD-10-CM

## 2024-08-29 ENCOUNTER — Ambulatory Visit: Admitting: Rehabilitative and Restorative Service Providers"

## 2024-08-30 ENCOUNTER — Other Ambulatory Visit: Payer: Self-pay | Admitting: Family Medicine

## 2024-08-30 DIAGNOSIS — E038 Other specified hypothyroidism: Secondary | ICD-10-CM

## 2024-09-01 MED ORDER — LEVOTHYROXINE SODIUM 88 MCG PO TABS: 88.0000 ug | ORAL_TABLET | Freq: Every day | ORAL | 1 refills | Status: AC

## 2024-09-02 ENCOUNTER — Ambulatory Visit: Payer: Self-pay | Admitting: Family Medicine

## 2024-09-02 DIAGNOSIS — N3289 Other specified disorders of bladder: Secondary | ICD-10-CM

## 2024-09-02 NOTE — Progress Notes (Signed)
 Hi Anna Ramsey, metabolic panel overall looks good though your ALT which is a limit for enzyme is off just by 1 point.  Not in a worrisome range but we will keep an eye on that.  Total cholesterol and LDL just mildly elevated.  Blood count is normal.  No sign of anemia.  Your thyroid level shifted.  I did adjust the medications to take an extra half a tab on Sundays and then we will plan to recheck your TSH in about 6 to 8 weeks.

## 2024-09-15 ENCOUNTER — Ambulatory Visit: Admitting: Physical Therapy

## 2024-09-15 ENCOUNTER — Encounter: Payer: Self-pay | Admitting: Physical Therapy

## 2024-09-15 ENCOUNTER — Encounter: Payer: Self-pay | Admitting: Neurology

## 2024-09-17 ENCOUNTER — Encounter: Payer: Self-pay | Admitting: Physical Therapy

## 2024-09-17 ENCOUNTER — Ambulatory Visit: Admitting: Physical Therapy

## 2024-09-17 DIAGNOSIS — R279 Unspecified lack of coordination: Secondary | ICD-10-CM

## 2024-09-17 DIAGNOSIS — R102 Pelvic and perineal pain unspecified side: Secondary | ICD-10-CM

## 2024-09-17 DIAGNOSIS — M6281 Muscle weakness (generalized): Secondary | ICD-10-CM | POA: Diagnosis not present

## 2024-09-17 DIAGNOSIS — R293 Abnormal posture: Secondary | ICD-10-CM

## 2024-09-17 DIAGNOSIS — M62838 Other muscle spasm: Secondary | ICD-10-CM

## 2024-09-17 DIAGNOSIS — R269 Unspecified abnormalities of gait and mobility: Secondary | ICD-10-CM

## 2024-09-17 LAB — URINALYSIS, ROUTINE W REFLEX MICROSCOPIC
Bilirubin, UA: NEGATIVE
Glucose, UA: NEGATIVE
Ketones, UA: NEGATIVE
Nitrite, UA: NEGATIVE
Protein,UA: NEGATIVE
RBC, UA: NEGATIVE
Specific Gravity, UA: 1.014 (ref 1.005–1.030)
Urobilinogen, Ur: 0.2 mg/dL (ref 0.2–1.0)
pH, UA: 6 (ref 5.0–7.5)

## 2024-09-17 LAB — MICROSCOPIC EXAMINATION
Bacteria, UA: NONE SEEN
Casts: NONE SEEN /LPF
Epithelial Cells (non renal): NONE SEEN /HPF (ref 0–10)
RBC, Urine: NONE SEEN /HPF (ref 0–2)

## 2024-09-17 NOTE — Therapy (Signed)
 " OUTPATIENT PHYSICAL THERAPY TREATMENT NOTE   Patient Name: Anna Ramsey MRN: [id number] DOB:11-Jan-1966, 58 y.o., female Today's Date: 09/17/2024  END OF SESSION:  PT End of Session - 09/17/24 0802     Visit Number 50    Date for Recertification  10/10/24    Authorization Type [employer]    PT Start Time 0800    PT Stop Time 0844    PT Time Calculation (min) 44 min    Activity Tolerance Patient tolerated treatment well    Behavior During Therapy [facility] for tasks assessed/performed                 Past Medical History:  Diagnosis Date   ADHD (attention deficit hyperactivity disorder) 2024   Dysuria    Erythematous bladder mucosa    Heart murmur    asymptomatic per pt --  no echo   Hematuria    History of cervical cancer    12/ 2014  Stage IB2--  s/p  TAH w/ BSO and pelvic lymphadectomy/  and radiation therapy   History of radiation therapy 10/20/13-11/28/13   pelvis 50.4 gray   Hypertension    Lesion of bladder    Mild intermittent asthma    Neuromuscular disorder (HCC)    Seasonal allergies    Thyroid disease    Past Surgical History:  Procedure Laterality Date   CYSTOSCOPY WITH BIOPSY N/A 04/29/2015   Procedure: CYSTOSCOPY WITH BIOPSY WITH FULGERATION;  Surgeon: Belvie LITTIE Clara, MD;  Location: [facility];  Service: Urology;  Laterality: N/A;  1 HR [phone] [id number]   FOOT SURGERY Left 2010   RADICAL ABDOMINAL HYSTERECTOMY  09-05-2013   w/ BILATERAL SALPINGOOPHORECTOMY AND PELVIC LYMPHADECTOMY   Patient Active Problem List   Diagnosis Date Noted   ADD (attention deficit disorder) 04/21/2024   SUI (stress urinary incontinence, female) 11/01/2023   History of urinary retention 11/01/2023   Neurogenic bladder 10/18/2023   Hematuria 10/18/2023   Stress and adjustment reaction 01/13/2021   Inattention 10/28/2019   Hypothyroid 11/13/2016   Neuropathy involving both lower extremities 11/13/2016   Cystitis, radiation 05/22/2014    Cervical cancer (HCC) 09/01/2013   Adenocarcinoma of cervix, 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   Asthma, mild intermittent 07/07/2009   INSOMNIA 05/28/2008   Generalized anxiety disorder 05/07/2008   HYPERTENSION, BENIGN 05/31/2007    PCP: Geraline Dorothyann JONETTA, MD  REFERRING PROVIDER: Skeet Juliene SAUNDERS, DO   REFERRING DIAG:  [phone] (ICD-10-CM) - Bilateral leg weakness G62.89 (ICD-10-CM) - 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 I had 1 bad day but overall I used my wheelchair and that ws very helpful.    Eval: Right leg worse than left but both painful. Right has instances of knee just giving out, has neuropathy - like weakness, pain since radiation in 2015. The nerves have been affected and greatly limiting her activity. Does have symptoms of numbness, sensory deficits, weakness, ataxic gait, and painful but all vary based on the day.     Fluid intake: Yes: water    PAIN: 09/17/2024  Are you having pain? Leg/foot feels heavy/tingly NPRS scale: 0/10, just numb and tingly Pain location: bil legs Right leg Pain type: aching Pain description: constant   Aggravating factors: walking, end of day, rainy days Relieving factors: try not to walk, decreasing activities    PRECAUTIONS: Other: cervical cancer with radiation  RED FLAGS: None   WEIGHT BEARING RESTRICTIONS: No  FALLS:  Has patient fallen  in last 6 months? Yes. Number of falls 1x/weekly due to weakness and decreased balance from having the radiation, trouble with low light - reports she does need to furniture walk to help with balance  LIVING ENVIRONMENT: Lives with: lives with their family  OCCUPATION: sits with laptop on her lap for 12 hours per day; unable to sit with legs down due to them falling asleep  PLOF: Independent  PATIENT GOALS: to strengthening enough to walk across grass, wants to have better balance, stronger legs                                        PERTINENT HISTORY:  ADHD; Cervical Cancer 2014; 9 weeks of radiation; Radical hysterectomy;    OBJECTIVE:  Note: Objective measures were completed at Evaluation unless otherwise noted.  DIAGNOSTIC FINDINGS:   PATIENT SURVEYS:  Lower Extremity Functional Score: 5 / 80 = 6.3 % 06/16/24 LEFS 28 / 80 = 35.0 % 07/15/24  19 / 80 = 23.8 %  COGNITION: Overall cognitive status: Within functional limits for tasks assessed     SENSATION: Poor sensation at Rt LE - light touch felt along posterior gastroc only no awareness without visualization of being touched at medial, lateral, anterior lower leg, reports numbness in bil feet Rt>Lt  EDEMA:  Rt ankle sometimes swells per pt  MUSCLE LENGTH: Bil hamstrings and adductors limited by 25%;  POSTURE: rounded shoulders and forward head  LOWER EXTREMITY ROM:  weakness and poor coordination/proprioception   LOWER EXTREMITY MMT:  02/04/24 hip flexion was R 3/5, L 4/5  MMT Right Eval */5 Left Eval */5 Right 5/16 Left 5/16 RIGHT 6/6 LEFT  6/6 Right 10/21 Left  10/21  Hip flexion 2 3 4+ 5 3  3  4+  Hip extension 2 3 4+ 4 4+ 4 3+ 4  Hip abduction 3 3+ 5- 5 5     Hip adduction 1+ 2+ 2+ 2+ 2+ 2+ 2+ 2+  Hip internal rotation          Hip external rotation          Knee flexion 2 3 3- 4 3+ 5 2+ 4  Knee extension 2+ 3 3- 5 3+     Ankle dorsiflexion 0 4 0 4+ long sitting 2 4  4+ long sitting 2      Ankle  plantarflexion 3 4        Ankle inversion  1 4 3+ tested in long sitting 4+ tested in long sitting 3+ in long sitting 5 in long sitting    Ankle eversion 2 4 3+ tested in long sitting 4- tested in long sitting 2+ 4- tested in long sitting     (Blank rows = not tested)   FUNCTIONAL TESTS:  Five times Sit to Stand Test (FTSS) Method: Use a straight back chair with a solid seat that is 16-18 high. Ask participant to sit on the chair with arms folded across their chest.   Instructions: Stand up and sit down as quickly as possible 5 times, keeping your arms folded across your chest.   Measurement: Stop timing when the participant stands the 5th time.  TIME: ______ (in seconds)  Times > 13.6 seconds is associated with increased disability and morbidity (Guralnik, 2000) Times > 15 seconds is predictive of recurrent falls in healthy individuals aged 28 and older (Buatois, et al., 2008) Normal performance values in community dwelling individuals aged 54 and older (Bohannon, 2006): 50-59 years: 8 seconds 60-69 years: 11.4 seconds 70-79 years: 12.6 seconds 80-89 years: 14.8 seconds  MCID: >= 2.3 seconds for Vestibular Disorders (Meretta, 2006)  5 times sit to stand: 22s with hands - very unstable with poor hip, posture, core control  Timed up and go (TUG): 28s with SBQC - min A   07/15/24 37.10 sec (one stand with no UE support, 4 with B UE support and last two with min A upon standing)   GAIT: Distance walked: 200' Assistive device utilized: Quad cane small base Level of assistance: CGA Comments: Rt LE demonstrating ataxic pattern, Lt trunk lean intermittently  to right self, poor step height bil, decreased stride length, limited glute activation    06/16/24 266 ft with SPC, improved step height and step length, intermittent LOB if pt turns to speak to therapist, but pt able to right herself independently. Minimal trunk lean.                                                                                                                                TREATMENT DATE:   09/17/24:Pt arrives for aquatic physical therapy. Treatment took place in 3.5-5.5 feet of water . Water  temperature was 91 degrees f. Pt entered the pool via steps independently with use of bil rails, HHA to get from walker to stairs. Pt requires buoyancy of water  for support and to offload joints with strengthening exercises.  Pt utilizes viscosity of the water  required for strengthening. Seated water  bench with 75% submersion: forward facing plank: knees in & out 10x f/b bicycle 30 sec. Repeated 2x.    -60% water  depth water  walking with rainbow floats  6x each direction with Vc for control of LE and really focusing on not circumducting her RTLE.  -static core with arm sway hold pink bells x20, then tandem stance 20x each side -forward facing plank on water  bench; knees in and out 10x  then bicycle 30 sec. Rest, repeat 2x more.VC for hips up -Plank with RB hand weights, toes on floor  1 min 3x -Static hip add/abd 15x2 Bil  08/27/24:Pt arrives for aquatic physical therapy. Treatment took place in 3.5-5.5 feet of water . Water  temperature was 90 degrees f. Pt entered the pool via steps independently with use of bil rails, HHA to get from walker to stairs. Pt requires buoyancy of water  for support and to offload joints with strengthening exercises.  Pt utilizes viscosity of the water  required for strengthening. Seated water  bench with 75% submersion: forward facing plank: knees in & out 10x f/b bicycle 30 sec. Repeated 2x.    -60% water  depth water  walking with Qtip 6x each direction with Vc for control of LE and really focusing on not circumducting her RTLE.  -static core with arm sway hold pink bells x20, then tandem stance 20x each side -forward facing plank on water  bench; knees in and out 10x then bicycle 30 sec. Rest, repeat 2x more.VC for hips up -Plank with RB hand weights, toes on floor  1 min 3x -Static hip  add/abd 15x2 Bil  08/20/24:Pt arrives for aquatic physical therapy. Treatment took place in 3.5-5.5 feet of water . Water  temperature was 90 degrees f. Pt entered the pool via steps independently with use of bil rails, HHA to get from walker to stairs. Pt requires buoyancy of water  for support and to offload joints with strengthening exercises.  Pt utilizes viscosity of the water  required for strengthening. Seated water  bench with 75% submersion   -60% water  depth water  walking with Qtip 6x each direction with Vc for control of LE and really focusing on not circumducting her RTLE.  -static core with arm sway hold pink bells x20, then tandem stance 20x each side -Plank with RB hand weights, toes on floor  45 sec 3x -Static hip add/abd 15x Bil     PATIENT EDUCATION:  Education details: [email or web address] - uses App Person educated: Patient Education method: Explanation, Demonstration, Tactile cues, Verbal cues, and Handouts Education comprehension: verbalized understanding, returned demonstration, verbal cues required, tactile cues required, and needs further education  HOME EXERCISE PROGRAM: Access Code: [email or web address] URL: [email or web address] Date: 07/07/2024 Prepared by: Mliss  Exercises - Supine Heel Slide with Strap  - 1 x daily - 7 x weekly - 3 sets - 10 reps - Pillow squeezes with kegel  - 1 x daily - 7 x weekly - 3 sets - 10 reps - Sit to Stand with Counter Support  - 1 x daily - 7 x weekly - 1 sets - 10 reps - Seated Long Arc Quad  - 1 x daily - 7 x weekly - 1-3 sets - 10 reps - 5 sec hold - Seated Isometric Hip Abduction with Resistance  - 1 x daily - 7 x weekly - 1-3 sets - 10 reps - Heel Raises with Counter Support  - 1 x daily - 7 x weekly - 1-3 sets - 10 reps - Standing Terminal Knee Extension at Wall with Ball  - 2 x daily - 7 x weekly - 2 sets - 10 reps - 5 sec hold - Squat with Chair Touch and Resistance Loop  - 1 x daily - 3 x weekly - 2 sets - 10 reps - Standing Knee  Flexion with Counter Support  - 1 x daily - 3 x weekly - 2 sets - 10 reps - Supine Bridge with Resistance Band  - 1 x daily - 7 x  weekly - 1-2 sets - 10 reps - 5 sec hold - Standing Hip Abduction on Slider  - 1 x daily - 3 x weekly - 2 sets - 10 reps - Calf stretch on rocker board  - 1 x daily - 7 x weekly - 1 sets - 3 reps - 30 sec  hold - Standing Anti-Rotation Press with Anchored Resistance  - 1 x daily - 3 x weekly - 2 sets - 10 reps - Standing Trunk Rotation with Resistance  - 1 x daily - 3 x weekly - 2 sets - 10 reps - Right Modified Dix Hallpike with Pillows  - 1 x daily - 1 x weekly - 1 sets - 1 reps - 30 sec hold  ASSESSMENT:  CLINICAL IMPRESSION: Pt returns from trip to [place]. She reports using her wheelchair for the majority of her trip. She was pleased with how she felt and reports having only 1 bad day. Despite not being in PT for just over a week her overall balance was good in the pool demonstrating good stability with her exercises. Pain was under control today.   Eval: Patient is a 58 y.o. female  who was seen today for physical therapy evaluation and tr in the teatment for poor gait mechanics, impaired posture, impaired balance, decreased hip and core strength, increased fall risk. Pt has complex history of  Radiation-induced lumbosacral plexopathy and Polyradiculoneuropathy status post cervical cancer. Pt has completed pelvic floor PT recently and had positive outcomes with this and resolution of symptoms. Pt very motivated to participate in PT for bil LE strengthening and decreased fall risk. Pt demonstrated impairments with objective measurements of TUG and 5xSTS indicative of increased fall risk and balance deficits. Pt reports she is unable to hold and carry grandchildren, to walk on uneven surfaces, needs assistance with dressing, walking, transfers sometimes and wants to be more I. Pt would benefit from additional PT to further address deficits.    OBJECTIVE IMPAIRMENTS:  Abnormal gait, decreased activity tolerance, decreased coordination, decreased endurance, decreased mobility, difficulty walking, decreased strength, increased fascial restrictions, increased muscle spasms, impaired flexibility, impaired sensation, improper body mechanics, postural dysfunction, and pain.   ACTIVITY LIMITATIONS: carrying, lifting, bending, sitting, standing, squatting, stairs, transfers, bed mobility, locomotion level, and caring for others  PARTICIPATION LIMITATIONS: meal prep, cleaning, laundry, interpersonal relationship, driving, shopping, community activity, occupation, and yard work  PERSONAL FACTORS: Fitness, Past/current experiences, Time since onset of injury/illness/exacerbation, and 1 comorbidity: medical history are also affecting patient's functional outcome.   REHAB POTENTIAL: Good  CLINICAL DECISION MAKING: Evolving/moderate complexity  EVALUATION COMPLEXITY: Moderate   GOALS: Goals reviewed with patient? Yes  SHORT TERM GOALS: Target date: 02/13/24 Pt to be I with HEP for carry over and continuing recommendations for improved outcomes.   Baseline: Goal status: MET  2.  Pt to demonstrate at least 3/5 Rt LE strength grossly and 4/5 Lt for improved pelvic stability and functional squats without falling.  Baseline:  Goal status: PARTIALLY MET 03/03/24  3.  Pt to demonstrate ability to complete static standing without AD for at least 5 mins at midline to complete gentle kitchen tasks/household tasks without fear of falling.  Baseline:  Goal status: MET  4.  Pt to demonstrate I with dynamic sitting balance to better be able to reach floor level for retrieving fallen objects and putting on socks without falling Baseline:  Goal status: MET 03/03/24  LONG TERM GOALS: Target date: 10/10/24  Pt to be I with advanced  HEP for carry over and continuing recommendations for improved outcomes.   Baseline:  Goal status: ongoing  2.  Pt to report improved LEFS score to  at least 30/80 for improved I with functional tasks at home such as dressing, walking from room to room.  Baseline:  Goal status: IN PROGRESS 28/80 06/16/24; 07/15/24  19 / 80 = 23.8 %  3.  Pt to demonstrate improved dynamic standing balance with or without AD at no more than CGA to better be able to play with grandchildren without falling.  Baseline:  Goal status: DEFERRED  4.  Pt to demonstrate improved gait mechanics with LRAD for 50 ' with minimal deviation from straight path to allow safe ambulation at home. Baseline:  Goal status: MODIFIED 07/15/24    5.  Pt to demonstrate ability to perform x5 Sit to stand with at least 10# to more safely assist in caring for grandchildren without risk of falling.  Baseline:  Goal status: DEFERRED  6.  Pt able to perform 3-5 sit to stands without UE assist showing improved functional LE strength Baseline: 1 in 30 sec on 07/15/24 Goal status: NEW  7.  Patient to report no falls when using AD at home or in the community. Baseline: using rolling walker at home and community (07/28/24) Goal status: Ongoing      PLAN:  PT FREQUENCY: 2x/week  PT DURATION: 12 weeks  PLANNED INTERVENTIONS:    97110-Therapeutic exercises, 97530- Therapeutic activity, 97112- Neuromuscular re-education, 97535- Self Care, 02859- Manual therapy, [phone]- Gait training, [phone]- Canalith repositioning, [id number]- Aquatic Therapy, [phone]- Splinting, [id number]- Electrical stimulation (manual), [id number]- Vasopneumatic device, [id number]- Ultrasound, [id number]- Traction (mechanical), [id number]- Ionotophoresis 4mg /ml Dexamethasone , Patient/Family education, Balance training, Stair training, Taping, Dry Needling, Joint mobilization, Joint manipulation, Spinal manipulation, Spinal mobilization, Scar mobilization, DME instructions, Wheelchair mobility training, Cryotherapy, Moist heat, and Biofeedback  NEXT VISIT: for aquatic PT, Work on ADDuction, core and LE strengthening and neuromuscular re-ed improved  gait and mobility LAND: continue with gait training with cane and walker, sit to stands, core and LE strength   Delon Darner, PTA 09/17/2024 11:03 AM   [facility] [address] [place], [place] [address] Phone # [phone] Fax [phone]  "

## 2024-09-18 LAB — URINE CULTURE

## 2024-09-19 NOTE — Progress Notes (Signed)
 No infection in the bladder. That is good to just rule out.

## 2024-09-22 ENCOUNTER — Encounter: Payer: Self-pay | Admitting: Rehabilitative and Restorative Service Providers"

## 2024-09-22 ENCOUNTER — Ambulatory Visit: Admitting: Rehabilitative and Restorative Service Providers"

## 2024-09-22 DIAGNOSIS — R269 Unspecified abnormalities of gait and mobility: Secondary | ICD-10-CM

## 2024-09-22 DIAGNOSIS — M6281 Muscle weakness (generalized): Secondary | ICD-10-CM | POA: Diagnosis not present

## 2024-09-22 DIAGNOSIS — R293 Abnormal posture: Secondary | ICD-10-CM

## 2024-09-22 NOTE — Therapy (Signed)
 " OUTPATIENT PHYSICAL THERAPY TREATMENT NOTE   Patient Name: Anna Ramsey MRN: [id number] DOB:Nov 07, 1965, 58 y.o., female Today's Date: 09/22/2024  END OF SESSION:  PT End of Session - 09/22/24 0805     Visit Number 51    Date for Recertification  10/10/24    Authorization Type [employer]    PT Start Time 0801    PT Stop Time 0840    PT Time Calculation (min) 39 min    Activity Tolerance Patient tolerated treatment well    Behavior During Therapy [facility] for tasks assessed/performed                 Past Medical History:  Diagnosis Date   ADHD (attention deficit hyperactivity disorder) 2024   Dysuria    Erythematous bladder mucosa    Heart murmur    asymptomatic per pt --  no echo   Hematuria    History of cervical cancer    12/ 2014  Stage IB2--  s/p  TAH w/ BSO and pelvic lymphadectomy/  and radiation therapy   History of radiation therapy 10/20/13-11/28/13   pelvis 50.4 gray   Hypertension    Lesion of bladder    Mild intermittent asthma    Neuromuscular disorder (HCC)    Seasonal allergies    Thyroid disease    Past Surgical History:  Procedure Laterality Date   CYSTOSCOPY WITH BIOPSY N/A 04/29/2015   Procedure: CYSTOSCOPY WITH BIOPSY WITH FULGERATION;  Surgeon: Belvie LITTIE Clara, MD;  Location: [facility];  Service: Urology;  Laterality: N/A;  1 HR [phone] [id number]   FOOT SURGERY Left 2010   RADICAL ABDOMINAL HYSTERECTOMY  09-05-2013   w/ BILATERAL SALPINGOOPHORECTOMY AND PELVIC LYMPHADECTOMY   Patient Active Problem List   Diagnosis Date Noted   ADD (attention deficit disorder) 04/21/2024   SUI (stress urinary incontinence, female) 11/01/2023   History of urinary retention 11/01/2023   Neurogenic bladder 10/18/2023   Hematuria 10/18/2023   Stress and adjustment reaction 01/13/2021   Inattention 10/28/2019   Hypothyroid 11/13/2016   Neuropathy involving both lower extremities 11/13/2016   Cystitis, radiation 05/22/2014    Cervical cancer (HCC) 09/01/2013   Adenocarcinoma of cervix, 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   Asthma, mild intermittent 07/07/2009   INSOMNIA 05/28/2008   Generalized anxiety disorder 05/07/2008   HYPERTENSION, BENIGN 05/31/2007    PCP: Geraline Dorothyann JONETTA, MD  REFERRING PROVIDER: Skeet Juliene SAUNDERS, DO   REFERRING DIAG:  [phone] (ICD-10-CM) - Bilateral leg weakness G62.89 (ICD-10-CM) - 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  States that she hurt her thumb, but feels that she is okay.   Eval: Right leg worse than left but both painful. Right has instances of knee just giving out, has neuropathy - like weakness, pain since radiation in 2015. The nerves have been affected and greatly limiting her activity. Does have symptoms of numbness, sensory deficits, weakness, ataxic gait, and painful but all vary based on the day.     Fluid intake: Yes: water    PAIN:  09/22/2024 Are you having pain? yes NPRS scale: 4/10 Pain location: left thumb Pain type: aching Pain description: constant   Aggravating factors: walking, end of day, rainy days Relieving factors: try not to walk, decreasing activities    PRECAUTIONS: Other: cervical cancer with radiation  RED FLAGS: None   WEIGHT BEARING RESTRICTIONS: No  FALLS:  Has patient fallen in last 6 months? Yes. Number of falls 1x/weekly due to  weakness and decreased balance from having the radiation, trouble with low light - reports she does need to furniture walk to help with balance  LIVING ENVIRONMENT: Lives with: lives with their family  OCCUPATION: sits with laptop on her lap for 12 hours per day; unable to sit with legs down due to them falling asleep  PLOF: Independent  PATIENT GOALS: to strengthening enough to walk across grass, wants to have better balance, stronger legs                                        PERTINENT HISTORY:  ADHD; Cervical Cancer 2014; 9 weeks of radiation; Radical hysterectomy;    OBJECTIVE:  Note: Objective measures were completed at Evaluation unless otherwise noted.  DIAGNOSTIC FINDINGS:   PATIENT SURVEYS:  Eval:  Lower Extremity Functional Score: 5 / 80 = 6.3 % 06/16/24 LEFS 28 / 80 = 35.0 % 07/15/24:  Lower Extremity Functional Score:  19 / 80 = 23.8 % 09/22/2024:  Lower Extremity Functional Score: 18/80= 22.5%  COGNITION: Overall cognitive status: Within functional limits for tasks assessed     SENSATION: Poor sensation at Rt LE - light touch felt along posterior gastroc only no awareness without visualization of being touched at medial, lateral, anterior lower leg, reports numbness in bil feet Rt>Lt  EDEMA:  Rt ankle sometimes swells per pt  MUSCLE LENGTH: Bil hamstrings and adductors limited by 25%;  POSTURE: rounded shoulders and forward head  LOWER EXTREMITY ROM:  weakness and poor coordination/proprioception   LOWER EXTREMITY MMT:  02/04/24 hip flexion was R 3/5, L 4/5  MMT Right Eval */5 Left Eval */5 Right 5/16 Left 5/16 RIGHT 6/6 LEFT  6/6 Right 10/21 Left  10/21  Hip flexion 2 3 4+ 5 3  3  4+  Hip extension 2 3 4+ 4 4+ 4 3+ 4  Hip abduction 3 3+ 5- 5 5     Hip adduction 1+ 2+ 2+ 2+ 2+ 2+ 2+ 2+  Hip internal rotation          Hip external rotation          Knee flexion 2 3 3- 4 3+ 5 2+ 4  Knee extension 2+ 3 3- 5 3+     Ankle dorsiflexion 0 4 0 4+  long sitting 2 4  4+ long  sitting 2      Ankle plantarflexion 3 4        Ankle inversion 1 4 3+ tested in long sitting 4+ tested in long sitting 3+ in long sitting 5 in long sitting    Ankle eversion 2 4 3+ tested in long sitting 4- tested in long sitting 2+ 4- tested in long sitting     (Blank rows = not tested)   FUNCTIONAL TESTS:  Five times Sit to Stand Test (FTSS) Method: Use a straight back chair with a solid seat that is 16-18 high. Ask participant to sit on the chair with arms folded across their chest.   Instructions: Stand up and sit down as quickly as possible 5 times, keeping your arms folded across your chest.   Measurement: Stop timing when the participant stands the 5th time.  TIME: ______ (in seconds)  Times > 13.6 seconds is associated with increased disability and morbidity (Guralnik, 2000) Times > 15 seconds is predictive of recurrent falls in healthy individuals aged 34 and older (Buatois, et al., 2008) Normal performance values in community dwelling individuals aged 73 and older (Bohannon, 2006): 50-59 years: 8 seconds 60-69 years: 11.4 seconds 70-79 years: 12.6 seconds 80-89 years: 14.8 seconds  MCID: >= 2.3 seconds for Vestibular Disorders (Meretta, 2006)  5 times sit to stand: 22s with hands - very unstable with poor hip, posture, core control  Timed up and go (TUG): 28s with SBQC - min A   07/15/24 37.10 sec (one stand with no UE support, 4 with B UE support and last two with min A upon standing)  09/22/2024: 5 times sit to stand:  13.02 sec with decreased muscle control noted Timed up and go (TUG):18.59 sec without assistive device   GAIT: Distance walked: 200' Assistive device utilized: [employer] Level of assistance: CGA Comments: Rt LE demonstrating ataxic pattern, Lt trunk lean intermittently  to right self, poor step height bil, decreased stride length, limited glute activation    06/16/24 266 ft with SPC, improved step height  and step length, intermittent LOB if pt turns to speak to therapist, but pt able to right herself independently. Minimal trunk lean.                                                                                                                               TREATMENT DATE:   09/22/2024: Nustep level 5 x6 min with PT present to discuss status 5 times sit to stand, TUG Side stepping on AirEx beam in parallel bars with UE support as needed down and back x3 laps FWD step over 4 small hurdles in parallel bars with UE support as needed down and back x3 laps Side stepping  over 4 small hurdles in parallel bars with UE support as needed down and back x3 laps Free Motion Machine: Standing on purple balance pad for scap retract with 3#  2x10  Free Motion Machine: Standing  shoulder D2 with 3# x 10 bilat Sit to/from stand with yellow tband around knees with PT providing tactile cuing for knee placement x10 stands    09/17/24: Pt arrives for aquatic physical therapy. Treatment took place in 3.5-5.5 feet of water . Water  temperature was 91 degrees f. Pt entered the pool via steps independently with use of bil rails, HHA to get from walker to stairs. Pt requires buoyancy of water  for support and to offload joints with strengthening exercises.  Pt utilizes viscosity of the water  required for strengthening. Seated water  bench with 75% submersion: forward facing plank: knees in & out 10x f/b bicycle 30 sec. Repeated 2x.    -60% water  depth water  walking with rainbow floats  6x each direction with Vc for control of LE and really focusing on not circumducting her RTLE.  -static core with arm sway hold pink bells x20, then tandem stance 20x each side -forward facing plank on water  bench; knees in and out 10x then bicycle 30 sec. Rest, repeat 2x more.VC for hips up -Plank with RB hand weights, toes on floor  1 min 3x -Static hip add/abd 15x2 Bil  08/27/24: Pt arrives for aquatic physical therapy. Treatment took  place in 3.5-5.5 feet of water . Water  temperature was 90 degrees f. Pt entered the pool via steps independently with use of bil rails, HHA to get from walker to stairs. Pt requires buoyancy of water  for support and to offload joints with strengthening exercises.  Pt utilizes viscosity of the water  required for strengthening. Seated water  bench with 75% submersion: forward facing plank: knees in & out 10x f/b bicycle 30 sec. Repeated 2x.    -60% water  depth water  walking with Qtip 6x each direction with Vc for control of LE and really focusing on not circumducting her RTLE.  -static core with arm sway hold pink bells x20, then tandem stance 20x each side -forward facing plank on water  bench; knees in and out 10x then bicycle 30 sec. Rest, repeat 2x more.VC for hips up -Plank with RB hand weights, toes on floor  1 min 3x -Static hip add/abd 15x2 Bil     PATIENT EDUCATION:  Education details: [email or web address] - uses App Person educated: Patient Education method: Explanation, Demonstration, Tactile cues, Verbal cues, and Handouts Education comprehension: verbalized understanding, returned demonstration, verbal cues required, tactile cues required, and needs further education  HOME EXERCISE PROGRAM: Access Code: [email or web address] URL: [email or web address] Date: 07/07/2024 Prepared by: Mliss  Exercises - Supine Heel Slide with Strap  - 1 x daily - 7 x weekly - 3 sets - 10 reps - Pillow squeezes with kegel  - 1 x daily - 7 x weekly - 3 sets - 10 reps - Sit to Stand with Counter Support  - 1 x daily - 7 x weekly - 1 sets - 10 reps - Seated Long Arc Quad  - 1 x daily - 7 x weekly - 1-3 sets - 10 reps - 5 sec hold - Seated Isometric Hip Abduction with Resistance  - 1 x daily - 7 x weekly - 1-3 sets - 10 reps - Heel Raises with Counter Support  - 1 x daily - 7 x weekly - 1-3 sets - 10 reps - Standing Terminal Knee Extension at Wall with Ball  - 2 x daily - 7 x weekly - 2 sets - 10 reps - 5 sec  hold - Squat with Chair Touch and Resistance Loop  - 1 x daily - 3 x weekly - 2  sets - 10 reps - Standing Knee Flexion with Counter Support  - 1 x daily - 3 x weekly - 2 sets - 10 reps - Supine Bridge with Resistance Band  - 1 x daily - 7 x weekly - 1-2 sets - 10 reps - 5 sec hold - Standing Hip Abduction on Slider  - 1 x daily - 3 x weekly - 2 sets - 10 reps - Calf stretch on rocker board  - 1 x daily - 7 x weekly - 1 sets - 3 reps - 30 sec  hold - Standing Anti-Rotation Press with Anchored Resistance  - 1 x daily - 3 x weekly - 2 sets - 10 reps - Standing Trunk Rotation with Resistance  - 1 x daily - 3 x weekly - 2 sets - 10 reps - Right Modified Dix Hallpike with Pillows  - 1 x daily - 1 x weekly - 1 sets - 1 reps - 30 sec hold  ASSESSMENT:  CLINICAL IMPRESSION:   Karna presents to skilled PT reporting that she is doing well and ready for PT to help start her week off with some strengthening.  Patient was able to perform new objective testing today and she had improved time on 5 times sit to stand and TUG.  Patient with difficulty with standing balance tasks and required UE support.  Patient continues to require skilled PT to progress towards goal related activities.  Eval: Patient is a 58 y.o. female  who was seen today for physical therapy evaluation and tr in the teatment for poor gait mechanics, impaired posture, impaired balance, decreased hip and core strength, increased fall risk. Pt has complex history of  Radiation-induced lumbosacral plexopathy and Polyradiculoneuropathy status post cervical cancer. Pt has completed pelvic floor PT recently and had positive outcomes with this and resolution of symptoms. Pt very motivated to participate in PT for bil LE strengthening and decreased fall risk. Pt demonstrated impairments with objective measurements of TUG and 5xSTS indicative of increased fall risk and balance deficits. Pt reports she is unable to hold and carry grandchildren, to walk on  uneven surfaces, needs assistance with dressing, walking, transfers sometimes and wants to be more I. Pt would benefit from additional PT to further address deficits.    OBJECTIVE IMPAIRMENTS: Abnormal gait, decreased activity tolerance, decreased coordination, decreased endurance, decreased mobility, difficulty walking, decreased strength, increased fascial restrictions, increased muscle spasms, impaired flexibility, impaired sensation, improper body mechanics, postural dysfunction, and pain.   ACTIVITY LIMITATIONS: carrying, lifting, bending, sitting, standing, squatting, stairs, transfers, bed mobility, locomotion level, and caring for others  PARTICIPATION LIMITATIONS: meal prep, cleaning, laundry, interpersonal relationship, driving, shopping, community activity, occupation, and yard work  PERSONAL FACTORS: Fitness, Past/current experiences, Time since onset of injury/illness/exacerbation, and 1 comorbidity: medical history are also affecting patient's functional outcome.   REHAB POTENTIAL: Good  CLINICAL DECISION MAKING: Evolving/moderate complexity  EVALUATION COMPLEXITY: Moderate   GOALS: Goals reviewed with patient? Yes  SHORT TERM GOALS: Target date: 02/13/24 Pt to be I with HEP for carry over and continuing recommendations for improved outcomes.   Baseline: Goal status: MET  2.  Pt to demonstrate at least 3/5 Rt LE strength grossly and 4/5 Lt for improved pelvic stability and functional squats without falling.  Baseline:  Goal status: PARTIALLY MET 03/03/24  3.  Pt to demonstrate ability to complete static standing without AD for at least 5 mins at midline to complete gentle kitchen tasks/household tasks without fear of falling.  Baseline:  Goal status: MET  4.  Pt to demonstrate I with dynamic sitting balance to better be able to reach floor level for retrieving fallen objects and putting on socks without falling Baseline:  Goal status: MET 03/03/24  LONG TERM GOALS:  Target date: 10/10/24  Pt to be I with advanced HEP for carry over and continuing recommendations for improved outcomes.   Baseline:  Goal status: ongoing  2.  Pt to report improved LEFS score to at least 30/80 for improved I with functional tasks at home such as dressing, walking from room to room.  Baseline:  Goal status: IN PROGRESS 28/80 06/16/24; 07/15/24  19 / 80 = 23.8 %  3.  Pt to demonstrate improved dynamic standing balance with or without AD at no more than CGA to better be able to play with grandchildren without falling.  Baseline:  Goal status: DEFERRED  4.  Pt to demonstrate improved gait mechanics with LRAD for 50 ' with minimal deviation from straight path to allow safe ambulation at home. Baseline:  Goal status: MODIFIED 07/15/24    5.  Pt to demonstrate ability to perform x5 Sit to stand with at least 10# to more safely assist in caring for grandchildren without risk of falling.  Baseline:  Goal status: DEFERRED  6.  Pt able to perform 3-5 sit to stands without UE assist showing improved functional LE strength Baseline: 1 in 30 sec on 07/15/24 Goal status: NEW  7.  Patient to report no falls when using AD at home or in the community. Baseline: using rolling walker at home and community (07/28/24) Goal status: Ongoing      PLAN:  PT FREQUENCY: 2x/week  PT DURATION: 12 weeks  PLANNED INTERVENTIONS:    97110-Therapeutic exercises, 97530- Therapeutic activity, 97112- Neuromuscular re-education, 97535- Self Care, 02859- Manual therapy, [phone]- Gait training, [phone]- Canalith repositioning, [id number]- Aquatic Therapy, [phone]- Splinting, [id number]- Electrical stimulation (manual), [id number]- Vasopneumatic device, [id number]- Ultrasound, [id number]- Traction (mechanical), [id number]- Ionotophoresis 4mg /ml Dexamethasone , Patient/Family education, Balance training, Stair training, Taping, Dry Needling, Joint mobilization, Joint manipulation, Spinal manipulation, Spinal mobilization, Scar  mobilization, DME instructions, Wheelchair mobility training, Cryotherapy, Moist heat, and Biofeedback  NEXT VISIT: for aquatic PT, Work on ADDuction, core and LE strengthening and neuromuscular re-ed improved gait and mobility LAND: continue with gait training with cane and walker, sit to stands, core and LE strength    Jarrell Laming, PT, DPT 09/22/2024, 9:15 AM  [facility] [address] [place], [place] [address] Phone # [phone] Fax [phone]  "

## 2024-09-24 ENCOUNTER — Encounter: Payer: Self-pay | Admitting: Physical Therapy

## 2024-09-24 ENCOUNTER — Ambulatory Visit: Admitting: Physical Therapy

## 2024-09-24 DIAGNOSIS — M6281 Muscle weakness (generalized): Secondary | ICD-10-CM | POA: Diagnosis not present

## 2024-09-24 DIAGNOSIS — M62838 Other muscle spasm: Secondary | ICD-10-CM

## 2024-09-24 DIAGNOSIS — R279 Unspecified lack of coordination: Secondary | ICD-10-CM

## 2024-09-24 DIAGNOSIS — R102 Pelvic and perineal pain unspecified side: Secondary | ICD-10-CM

## 2024-09-24 DIAGNOSIS — R293 Abnormal posture: Secondary | ICD-10-CM

## 2024-09-24 DIAGNOSIS — R269 Unspecified abnormalities of gait and mobility: Secondary | ICD-10-CM

## 2024-09-24 NOTE — Therapy (Signed)
 " OUTPATIENT PHYSICAL THERAPY TREATMENT NOTE   Patient Name: Anna Ramsey MRN: [id number] DOB:19-Apr-1966, 58 y.o., female Today's Date: 09/24/2024  END OF SESSION:  PT End of Session - 09/24/24 0749     Visit Number 52    Date for Recertification  10/10/24    Authorization Type [employer]    PT Start Time 0750    PT Stop Time 0850    PT Time Calculation (min) 60 min    Activity Tolerance Patient tolerated treatment well    Behavior During Therapy [facility] for tasks assessed/performed                 Past Medical History:  Diagnosis Date   ADHD (attention deficit hyperactivity disorder) 2024   Dysuria    Erythematous bladder mucosa    Heart murmur    asymptomatic per pt --  no echo   Hematuria    History of cervical cancer    12/ 2014  Stage IB2--  s/p  TAH w/ BSO and pelvic lymphadectomy/  and radiation therapy   History of radiation therapy 10/20/13-11/28/13   pelvis 50.4 gray   Hypertension    Lesion of bladder    Mild intermittent asthma    Neuromuscular disorder (HCC)    Seasonal allergies    Thyroid disease    Past Surgical History:  Procedure Laterality Date   CYSTOSCOPY WITH BIOPSY N/A 04/29/2015   Procedure: CYSTOSCOPY WITH BIOPSY WITH FULGERATION;  Surgeon: Belvie LITTIE Clara, MD;  Location: [facility];  Service: Urology;  Laterality: N/A;  1 HR [phone] [id number]   FOOT SURGERY Left 2010   RADICAL ABDOMINAL HYSTERECTOMY  09-05-2013   w/ BILATERAL SALPINGOOPHORECTOMY AND PELVIC LYMPHADECTOMY   Patient Active Problem List   Diagnosis Date Noted   ADD (attention deficit disorder) 04/21/2024   SUI (stress urinary incontinence, female) 11/01/2023   History of urinary retention 11/01/2023   Neurogenic bladder 10/18/2023   Hematuria 10/18/2023   Stress and adjustment reaction 01/13/2021   Inattention 10/28/2019   Hypothyroid 11/13/2016   Neuropathy involving both lower extremities 11/13/2016   Cystitis, radiation 05/22/2014    Cervical cancer (HCC) 09/01/2013   Adenocarcinoma of cervix, 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   Asthma, mild intermittent 07/07/2009   INSOMNIA 05/28/2008   Generalized anxiety disorder 05/07/2008   HYPERTENSION, BENIGN 05/31/2007    PCP: Geraline Dorothyann JONETTA, MD  REFERRING PROVIDER: Skeet Juliene SAUNDERS, DO   REFERRING DIAG:  [phone] (ICD-10-CM) - Bilateral leg weakness G62.89 (ICD-10-CM) - 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   Eval: Right leg worse than left but both painful. Right has instances of knee just giving out, has neuropathy - like weakness, pain since radiation in 2015. The nerves have been affected and greatly limiting her activity. Does have symptoms of numbness, sensory deficits, weakness, ataxic gait, and painful but all vary based on the day.     Fluid intake: Yes: water    PAIN:  09/22/2024 Are you having pain? yes NPRS scale: 2/10 Pain location: left thumb Pain type: aching Pain description: constant   Aggravating factors: walking, end of day, rainy days Relieving factors: try not to walk, decreasing activities    PRECAUTIONS: Other: cervical cancer with radiation  RED FLAGS: None   WEIGHT BEARING RESTRICTIONS: No  FALLS:  Has patient fallen in last 6 months? Yes. Number of falls 1x/weekly due to weakness and decreased  balance from having the radiation, trouble with low light - reports she does need to furniture walk to help with balance  LIVING ENVIRONMENT: Lives with: lives with their family  OCCUPATION: sits with laptop on her lap for 12 hours per day; unable to sit with legs down due to them falling asleep  PLOF: Independent  PATIENT GOALS: to strengthening enough to walk across grass, wants to have better balance, stronger legs                                        PERTINENT HISTORY:  ADHD; Cervical Cancer 2014; 9 weeks of radiation; Radical hysterectomy;    OBJECTIVE:  Note: Objective measures were completed at Evaluation unless otherwise noted.  DIAGNOSTIC FINDINGS:   PATIENT SURVEYS:  Eval:  Lower Extremity Functional Score: 5 / 80 = 6.3 % 06/16/24 LEFS 28 / 80 = 35.0 % 07/15/24:  Lower Extremity Functional Score:  19 / 80 = 23.8 % 09/22/2024:  Lower Extremity Functional Score: 18/80= 22.5%  COGNITION: Overall cognitive status: Within functional limits for tasks assessed     SENSATION: Poor sensation at Rt LE - light touch felt along posterior gastroc only no awareness without visualization of being touched at medial, lateral, anterior lower leg, reports numbness in bil feet Rt>Lt  EDEMA:  Rt ankle sometimes swells per pt  MUSCLE LENGTH: Bil hamstrings and adductors limited by 25%;  POSTURE: rounded shoulders and forward head  LOWER EXTREMITY ROM:  weakness and poor coordination/proprioception   LOWER EXTREMITY MMT:  02/04/24 hip flexion was R 3/5, L 4/5  MMT Right Eval */5 Left Eval */5 Right 5/16 Left 5/16 RIGHT 6/6 LEFT  6/6 Right 10/21 Left  10/21  Hip flexion 2 3 4+ 5 3  3  4+  Hip extension 2 3 4+ 4 4+ 4 3+ 4  Hip abduction 3 3+ 5- 5 5     Hip adduction 1+ 2+ 2+ 2+ 2+ 2+ 2+ 2+  Hip internal rotation          Hip external rotation          Knee flexion 2 3 3- 4 3+ 5 2+ 4  Knee extension 2+ 3 3- 5 3+     Ankle dorsiflexion 0 4 0 4+ long sitting 2 4  4+  long sitting 2      Ankle plantarflexion 3 4        Ankle inversion  1 4 3+ tested in long sitting 4+ tested in long sitting 3+ in long sitting 5 in long sitting    Ankle eversion 2 4 3+ tested in long sitting 4- tested in long sitting 2+ 4- tested in long sitting     (Blank rows = not tested)   FUNCTIONAL TESTS:  Five times Sit to Stand Test (FTSS) Method: Use a straight back chair with a solid seat that is 16-18 high. Ask participant to sit on the chair with arms folded across their chest.   Instructions: Stand up and sit down as quickly as possible 5 times, keeping your arms folded across your chest.   Measurement: Stop timing when the participant stands the 5th time.  TIME: ______ (in seconds)  Times > 13.6 seconds is associated with increased disability and morbidity (Guralnik, 2000) Times > 15 seconds is predictive of recurrent falls in healthy individuals aged 25 and older (Buatois, et al., 2008) Normal performance values in community dwelling individuals aged 21 and older (Bohannon, 2006): 50-59 years: 8 seconds 60-69 years: 11.4 seconds 70-79 years: 12.6 seconds 80-89 years: 14.8 seconds  MCID: >= 2.3 seconds for Vestibular Disorders (Meretta, 2006)  5 times sit to stand: 22s with hands - very unstable with poor hip, posture, core control  Timed up and go (TUG): 28s with SBQC - min A   07/15/24 37.10 sec (one stand with no UE support, 4 with B UE support and last two with min A upon standing)  09/22/2024: 5 times sit to stand:  13.02 sec with decreased muscle control noted Timed up and go (TUG):18.59 sec without assistive device   GAIT: Distance walked: 200' Assistive device utilized: [employer] Level of assistance: CGA Comments: Rt LE demonstrating ataxic pattern, Lt trunk lean intermittently  to right self, poor step height bil, decreased stride length, limited glute activation    06/16/24 266 ft with SPC, improved step height and step length,  intermittent LOB if pt turns to speak to therapist, but pt able to right herself independently. Minimal trunk lean.                                                                                                                               TREATMENT DATE:   09/24/24:Pt arrives for aquatic physical therapy. Treatment took place in 3.5-5.5 feet of water . Water  temperature was 91 degrees f. Pt entered the pool via steps independently with use of bil rails, HHA to get from walker to stairs. Pt requires buoyancy of water  for support and to offload joints with strengthening exercises.  Pt utilizes viscosity of the water  required for strengthening. Seated water  bench with 75% submersion: forward facing plank: knees in & out 10x f/b bicycle 2 min, rest, then 1 additional min.    -60% water  depth water  walking with rainbow floats  8x each direction with Vc for control of LE and really focusing on not circumducting her RTLE.  -  static core with arm sway hold pink bells x20, then tandem stance 20x each side -forward facing plank on water  bench; knees in and out 10x then bicycle 30 sec. Rest, repeat 2x more.VC for hips up -Plank with RB hand weights, toes on floor  1 min 3x -Static hip add/abd 15x2 Bil  09/22/2024: Nustep level 5 x6 min with PT present to discuss status 5 times sit to stand, TUG Side stepping on AirEx beam in parallel bars with UE support as needed down and back x3 laps FWD step over 4 small hurdles in parallel bars with UE support as needed down and back x3 laps Side stepping  over 4 small hurdles in parallel bars with UE support as needed down and back x3 laps Free Motion Machine: Standing on purple balance pad for scap retract with 3#  2x10  Free Motion Machine: Standing shoulder D2 with 3# x 10 bilat Sit to/from stand with yellow tband around knees with PT providing tactile cuing for knee placement x10 stands    09/17/24: Pt arrives for aquatic physical therapy. Treatment took  place in 3.5-5.5 feet of water . Water  temperature was 91 degrees f. Pt entered the pool via steps independently with use of bil rails, HHA to get from walker to stairs. Pt requires buoyancy of water  for support and to offload joints with strengthening exercises.  Pt utilizes viscosity of the water  required for strengthening. Seated water  bench with 75% submersion: forward facing plank: knees in & out 10x f/b bicycle 30 sec. Repeated 2x.    -60% water  depth water  walking with rainbow floats  6x each direction with Vc for control of LE and really focusing on not circumducting her RTLE.  -static core with arm sway hold pink bells x20, then tandem stance 20x each side -forward facing plank on water  bench; knees in and out 10x then bicycle 30 sec. Rest, repeat 2x more.VC for hips up -Plank with RB hand weights, toes on floor  1 min 3x -Static hip add/abd 15x2 Bil   PATIENT EDUCATION:  Education details: [email or web address] - uses App Person educated: Patient Education method: Explanation, Demonstration, Tactile cues, Verbal cues, and Handouts Education comprehension: verbalized understanding, returned demonstration, verbal cues required, tactile cues required, and needs further education  HOME EXERCISE PROGRAM: Access Code: [email or web address] URL: [email or web address] Date: 07/07/2024 Prepared by: Mliss  Exercises - Supine Heel Slide with Strap  - 1 x daily - 7 x weekly - 3 sets - 10 reps - Pillow squeezes with kegel  - 1 x daily - 7 x weekly - 3 sets - 10 reps - Sit to Stand with Counter Support  - 1 x daily - 7 x weekly - 1 sets - 10 reps - Seated Long Arc Quad  - 1 x daily - 7 x weekly - 1-3 sets - 10 reps - 5 sec hold - Seated Isometric Hip Abduction with Resistance  - 1 x daily - 7 x weekly - 1-3 sets - 10 reps - Heel Raises with Counter Support  - 1 x daily - 7 x weekly - 1-3 sets - 10 reps - Standing Terminal Knee Extension at Wall with Ball  - 2 x daily - 7 x weekly - 2 sets - 10 reps - 5  sec hold - Squat with Chair Touch and Resistance Loop  - 1 x daily - 3 x weekly - 2 sets - 10 reps - Standing Knee Flexion with Counter Support  - 1 x daily - 3 x  weekly - 2 sets - 10 reps - Supine Bridge with Resistance Band  - 1 x daily - 7 x weekly - 1-2 sets - 10 reps - 5 sec hold - Standing Hip Abduction on Slider  - 1 x daily - 3 x weekly - 2 sets - 10 reps - Calf stretch on rocker board  - 1 x daily - 7 x weekly - 1 sets - 3 reps - 30 sec  hold - Standing Anti-Rotation Press with Anchored Resistance  - 1 x daily - 3 x weekly - 2 sets - 10 reps - Standing Trunk Rotation with Resistance  - 1 x daily - 3 x weekly - 2 sets - 10 reps - Right Modified Dix Hallpike with Pillows  - 1 x daily - 1 x weekly - 1 sets - 1 reps - 30 sec hold  ASSESSMENT:  CLINICAL IMPRESSION: LE slightly wobbly as pt got started in the pool today. Reps/time improved the steadiness. Pt required quite a bit of mental focus to control the movement of her RTLE today. No pain while exercising in the pool. We had some extra time with pt being early today so we attempted longer bicycle in plank. Definitely fatiguing to LE but no adverse effects.   Eval: Patient is a 58 y.o. female  who was seen today for physical therapy evaluation and tr in the teatment for poor gait mechanics, impaired posture, impaired balance, decreased hip and core strength, increased fall risk. Pt has complex history of  Radiation-induced lumbosacral plexopathy and Polyradiculoneuropathy status post cervical cancer. Pt has completed pelvic floor PT recently and had positive outcomes with this and resolution of symptoms. Pt very motivated to participate in PT for bil LE strengthening and decreased fall risk. Pt demonstrated impairments with objective measurements of TUG and 5xSTS indicative of increased fall risk and balance deficits. Pt reports she is unable to hold and carry grandchildren, to walk on uneven surfaces, needs assistance with dressing, walking,  transfers sometimes and wants to be more I. Pt would benefit from additional PT to further address deficits.    OBJECTIVE IMPAIRMENTS: Abnormal gait, decreased activity tolerance, decreased coordination, decreased endurance, decreased mobility, difficulty walking, decreased strength, increased fascial restrictions, increased muscle spasms, impaired flexibility, impaired sensation, improper body mechanics, postural dysfunction, and pain.   ACTIVITY LIMITATIONS: carrying, lifting, bending, sitting, standing, squatting, stairs, transfers, bed mobility, locomotion level, and caring for others  PARTICIPATION LIMITATIONS: meal prep, cleaning, laundry, interpersonal relationship, driving, shopping, community activity, occupation, and yard work  PERSONAL FACTORS: Fitness, Past/current experiences, Time since onset of injury/illness/exacerbation, and 1 comorbidity: medical history are also affecting patient's functional outcome.   REHAB POTENTIAL: Good  CLINICAL DECISION MAKING: Evolving/moderate complexity  EVALUATION COMPLEXITY: Moderate   GOALS: Goals reviewed with patient? Yes  SHORT TERM GOALS: Target date: 02/13/24 Pt to be I with HEP for carry over and continuing recommendations for improved outcomes.   Baseline: Goal status: MET  2.  Pt to demonstrate at least 3/5 Rt LE strength grossly and 4/5 Lt for improved pelvic stability and functional squats without falling.  Baseline:  Goal status: PARTIALLY MET 03/03/24  3.  Pt to demonstrate ability to complete static standing without AD for at least 5 mins at midline to complete gentle kitchen tasks/household tasks without fear of falling.  Baseline:  Goal status: MET  4.  Pt to demonstrate I with dynamic sitting balance to better be able to reach floor level for retrieving fallen objects and putting  on socks without falling Baseline:  Goal status: MET 03/03/24  LONG TERM GOALS: Target date: 10/10/24  Pt to be I with advanced HEP for carry  over and continuing recommendations for improved outcomes.   Baseline:  Goal status: ongoing  2.  Pt to report improved LEFS score to at least 30/80 for improved I with functional tasks at home such as dressing, walking from room to room.  Baseline:  Goal status: IN PROGRESS 28/80 06/16/24; 07/15/24  19 / 80 = 23.8 %  3.  Pt to demonstrate improved dynamic standing balance with or without AD at no more than CGA to better be able to play with grandchildren without falling.  Baseline:  Goal status: DEFERRED  4.  Pt to demonstrate improved gait mechanics with LRAD for 50 ' with minimal deviation from straight path to allow safe ambulation at home. Baseline:  Goal status: MODIFIED 07/15/24    5.  Pt to demonstrate ability to perform x5 Sit to stand with at least 10# to more safely assist in caring for grandchildren without risk of falling.  Baseline:  Goal status: DEFERRED  6.  Pt able to perform 3-5 sit to stands without UE assist showing improved functional LE strength Baseline: 1 in 30 sec on 07/15/24 Goal status: NEW  7.  Patient to report no falls when using AD at home or in the community. Baseline: using rolling walker at home and community (07/28/24) Goal status: Ongoing      PLAN:  PT FREQUENCY: 2x/week  PT DURATION: 12 weeks  PLANNED INTERVENTIONS:    97110-Therapeutic exercises, 97530- Therapeutic activity, 97112- Neuromuscular re-education, 97535- Self Care, 02859- Manual therapy, [phone]- Gait training, [phone]- Canalith repositioning, [id number]- Aquatic Therapy, [phone]- Splinting, [id number]- Electrical stimulation (manual), [id number]- Vasopneumatic device, [id number]- Ultrasound, [id number]- Traction (mechanical), [id number]- Ionotophoresis 4mg /ml Dexamethasone , Patient/Family education, Balance training, Stair training, Taping, Dry Needling, Joint mobilization, Joint manipulation, Spinal manipulation, Spinal mobilization, Scar mobilization, DME instructions, Wheelchair mobility training, Cryotherapy,  Moist heat, and Biofeedback  NEXT VISIT: for aquatic PT, Work on ADDuction, core and LE strengthening and neuromuscular re-ed improved gait and mobility LAND: continue with gait training with cane and walker, sit to stands, core and LE strength   Delon Darner, PTA 09/24/2024 8:53 AM     [facility] [address] [place], [place] [address] Phone # [phone] Fax [phone]  "

## 2024-09-30 NOTE — Telephone Encounter (Signed)
 See labs from 12/1

## 2024-10-01 ENCOUNTER — Encounter: Payer: Self-pay | Admitting: Physical Therapy

## 2024-10-01 ENCOUNTER — Ambulatory Visit: Attending: Neurology | Admitting: Physical Therapy

## 2024-10-01 DIAGNOSIS — R269 Unspecified abnormalities of gait and mobility: Secondary | ICD-10-CM | POA: Diagnosis present

## 2024-10-01 DIAGNOSIS — R102 Pelvic and perineal pain unspecified side: Secondary | ICD-10-CM | POA: Insufficient documentation

## 2024-10-01 DIAGNOSIS — M62838 Other muscle spasm: Secondary | ICD-10-CM | POA: Insufficient documentation

## 2024-10-01 DIAGNOSIS — R279 Unspecified lack of coordination: Secondary | ICD-10-CM | POA: Insufficient documentation

## 2024-10-01 DIAGNOSIS — R293 Abnormal posture: Secondary | ICD-10-CM | POA: Insufficient documentation

## 2024-10-01 DIAGNOSIS — M6281 Muscle weakness (generalized): Secondary | ICD-10-CM | POA: Diagnosis present

## 2024-10-01 NOTE — Therapy (Signed)
 " OUTPATIENT PHYSICAL THERAPY TREATMENT NOTE   Patient Name: Anna Ramsey MRN: [id number] DOB:02-16-1966, 59 y.o., female Today's Date: 10/01/2024  END OF SESSION:  PT End of Session - 10/01/24 0755     Visit Number 53    Date for Recertification  10/10/24    Authorization Type [employer]    PT Start Time 0755    PT Stop Time 0850    PT Time Calculation (min) 55 min    Activity Tolerance Patient tolerated treatment well    Behavior During Therapy [facility] for tasks assessed/performed                  Past Medical History:  Diagnosis Date   ADHD (attention deficit hyperactivity disorder) 2024   Dysuria    Erythematous bladder mucosa    Heart murmur    asymptomatic per pt --  no echo   Hematuria    History of cervical cancer    12/ 2014  Stage IB2--  s/p  TAH w/ BSO and pelvic lymphadectomy/  and radiation therapy   History of radiation therapy 10/20/13-11/28/13   pelvis 50.4 gray   Hypertension    Lesion of bladder    Mild intermittent asthma    Neuromuscular disorder (HCC)    Seasonal allergies    Thyroid disease    Past Surgical History:  Procedure Laterality Date   CYSTOSCOPY WITH BIOPSY N/A 04/29/2015   Procedure: CYSTOSCOPY WITH BIOPSY WITH FULGERATION;  Surgeon: Belvie LITTIE Clara, MD;  Location: [facility];  Service: Urology;  Laterality: N/A;  1 HR [phone] [id number]   FOOT SURGERY Left 2010   RADICAL ABDOMINAL HYSTERECTOMY  09-05-2013   w/ BILATERAL SALPINGOOPHORECTOMY AND PELVIC LYMPHADECTOMY   Patient Active Problem List   Diagnosis Date Noted   ADD (attention deficit disorder) 04/21/2024   SUI (stress urinary incontinence, female) 11/01/2023   History of urinary retention 11/01/2023   Neurogenic bladder 10/18/2023   Hematuria 10/18/2023   Stress and adjustment reaction 01/13/2021   Inattention 10/28/2019   Hypothyroid 11/13/2016   Neuropathy involving both lower extremities 11/13/2016   Cystitis, radiation 05/22/2014    Cervical cancer (HCC) 09/01/2013   Adenocarcinoma of cervix, 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   Asthma, mild intermittent 07/07/2009   INSOMNIA 05/28/2008   Generalized anxiety disorder 05/07/2008   HYPERTENSION, BENIGN 05/31/2007    PCP: Geraline Dorothyann JONETTA, MD  REFERRING PROVIDER: Skeet Juliene SAUNDERS, DO   REFERRING DIAG:  [phone] (ICD-10-CM) - Bilateral leg weakness G62.89 (ICD-10-CM) - 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 I had a good wee   Eval: Right leg worse than left but both painful. Right has instances of knee just giving out, has neuropathy - like weakness, pain since radiation in 2015. The nerves have been affected and greatly limiting her activity. Does have symptoms of numbness, sensory deficits, weakness, ataxic gait, and painful but all vary based on the day.     Fluid intake: Yes: water    PAIN:  09/22/2024 Are you having pain? no NPRS scale: /10 Pain location:  Pain type: Pain description: constant  Aggravating factors: walking, end of day, rainy days Relieving factors: try not to walk, decreasing activities    PRECAUTIONS: Other: cervical cancer with radiation  RED FLAGS: None   WEIGHT BEARING RESTRICTIONS: No  FALLS:  Has patient fallen in last 6 months? Yes. Number of falls 1x/weekly due to weakness and decreased balance from having the  radiation, trouble with low light - reports she does need to furniture walk to help with balance  LIVING ENVIRONMENT: Lives with: lives with their family  OCCUPATION: sits with laptop on her lap for 12 hours per day; unable to sit with legs down due to them falling asleep  PLOF: Independent  PATIENT GOALS: to strengthening enough to walk across grass, wants to have better balance, stronger legs                                        PERTINENT HISTORY:  ADHD; Cervical Cancer 2014; 9 weeks of radiation; Radical hysterectomy;    OBJECTIVE:  Note: Objective measures were completed at Evaluation unless otherwise noted.  DIAGNOSTIC FINDINGS:   PATIENT SURVEYS:  Eval:  Lower Extremity Functional Score: 5 / 80 = 6.3 % 06/16/24 LEFS 28 / 80 = 35.0 % 07/15/24:  Lower Extremity Functional Score:  19 / 80 = 23.8 % 09/22/2024:  Lower Extremity Functional Score: 18/80= 22.5%  COGNITION: Overall cognitive status: Within functional limits for tasks assessed     SENSATION: Poor sensation at Rt LE - light touch felt along posterior gastroc only no awareness without visualization of being touched at medial, lateral, anterior lower leg, reports numbness in bil feet Rt>Lt  EDEMA:  Rt ankle sometimes swells per pt  MUSCLE LENGTH: Bil hamstrings and adductors limited by 25%;  POSTURE: rounded shoulders and forward head  LOWER EXTREMITY ROM:  weakness and poor coordination/proprioception   LOWER EXTREMITY MMT:  02/04/24 hip flexion was R 3/5, L 4/5  MMT Right Eval */5 Left Eval */5 Right 5/16 Left 5/16 RIGHT 6/6 LEFT  6/6 Right 10/21 Left  10/21  Hip flexion 2 3 4+ 5 3  3  4+  Hip extension 2 3 4+ 4 4+ 4 3+ 4  Hip abduction 3 3+ 5- 5 5     Hip adduction 1+ 2+ 2+ 2+ 2+ 2+ 2+ 2+  Hip internal rotation          Hip external rotation          Knee flexion 2 3 3- 4 3+ 5 2+ 4  Knee extension 2+ 3 3- 5 3+     Ankle dorsiflexion 0 4 0 4+ long sitting 2 4  4+ long sitting 2      Ankle  plantarflexion 3 4        Ankle inversion 1 4 3+ tested in  long sitting 4+ tested in long sitting 3+ in long sitting 5 in long sitting    Ankle eversion 2 4 3+ tested in long sitting 4- tested in long sitting 2+ 4- tested in long sitting     (Blank rows = not tested)   FUNCTIONAL TESTS:  Five times Sit to Stand Test (FTSS) Method: Use a straight back chair with a solid seat that is 16-18 high. Ask participant to sit on the chair with arms folded across their chest.   Instructions: Stand up and sit down as quickly as possible 5 times, keeping your arms folded across your chest.   Measurement: Stop timing when the participant stands the 5th time.  TIME: ______ (in seconds)  Times > 13.6 seconds is associated with increased disability and morbidity (Guralnik, 2000) Times > 15 seconds is predictive of recurrent falls in healthy individuals aged 61 and older (Buatois, et al., 2008) Normal performance values in community dwelling individuals aged 59 and older (Bohannon, 2006): 50-59 years: 8 seconds 60-69 years: 11.4 seconds 70-79 years: 12.6 seconds 80-89 years: 14.8 seconds  MCID: >= 2.3 seconds for Vestibular Disorders (Meretta, 2006)  5 times sit to stand: 22s with hands - very unstable with poor hip, posture, core control  Timed up and go (TUG): 28s with SBQC - min A   07/15/24 37.10 sec (one stand with no UE support, 4 with B UE support and last two with min A upon standing)  09/22/2024: 5 times sit to stand:  13.02 sec with decreased muscle control noted Timed up and go (TUG):18.59 sec without assistive device   GAIT: Distance walked: 200' Assistive device utilized: [employer] Level of assistance: CGA Comments: Rt LE demonstrating ataxic pattern, Lt trunk lean intermittently  to right self, poor step height bil, decreased stride length, limited glute activation    06/16/24 266 ft with SPC, improved step height and step length, intermittent LOB if pt turns to  speak to therapist, but pt able to right herself independently. Minimal trunk lean.                                                                                                                               TREATMENT DATE:   10/01/24:t arrives for aquatic physical therapy. Treatment took place in 3.5-5.5 feet of water . Water  temperature was 91 degrees f. Pt entered the pool via steps independently with use of bil rails, HHA to get from walker to stairs. Pt requires buoyancy of water  for support and to offload joints with strengthening exercises.  Pt utilizes viscosity of the water  required for strengthening. Seated water  bench with 75% submersion for LE AROM/warm up/discussion of status.    -60% water  depth water  walking with rainbow floats  8x each direction with Vc for control of LE and really focusing on not circumducting her RTLE.  -static core with arm sway hold pink bells x20, then tandem stance 20x each side -  forward facing plank on water  bench; knees in and out 10x then bicycle 2 min. Rest, repeat.VC for hips up -Plank with RB hand weights, toes on floor  1 min 3x -Static hip add/abd 15x2 Bil  09/24/24:Pt arrives for aquatic physical therapy. Treatment took place in 3.5-5.5 feet of water . Water  temperature was 91 degrees f. Pt entered the pool via steps independently with use of bil rails, HHA to get from walker to stairs. Pt requires buoyancy of water  for support and to offload joints with strengthening exercises.  Pt utilizes viscosity of the water  required for strengthening. Seated water  bench with 75% submersion: forward facing plank: knees in & out 10x f/b bicycle 2 min, rest, then 1 additional min.    -60% water  depth water  walking with rainbow floats  8x each direction with Vc for control of LE and really focusing on not circumducting her RTLE.  -static core with arm sway hold pink bells x20, then tandem stance 20x each side -forward facing plank on water  bench; knees in and out 10x  then bicycle 30 sec. Rest, repeat 2x more.VC for hips up -Plank with RB hand weights, toes on floor  1 min 3x -Static hip add/abd 15x2 Bil  09/22/2024: Nustep level 5 x6 min with PT present to discuss status 5 times sit to stand, TUG Side stepping on AirEx beam in parallel bars with UE support as needed down and back x3 laps FWD step over 4 small hurdles in parallel bars with UE support as needed down and back x3 laps Side stepping  over 4 small hurdles in parallel bars with UE support as needed down and back x3 laps Free Motion Machine: Standing on purple balance pad for scap retract with 3#  2x10  Free Motion Machine: Standing shoulder D2 with 3# x 10 bilat Sit to/from stand with yellow tband around knees with PT providing tactile cuing for knee placement x10 stands  PATIENT EDUCATION:  Education details: [email or web address] - uses App Person educated: Patient Education method: Explanation, Demonstration, Tactile cues, Verbal cues, and Handouts Education comprehension: verbalized understanding, returned demonstration, verbal cues required, tactile cues required, and needs further education  HOME EXERCISE PROGRAM: Access Code: [email or web address] URL: [email or web address] Date: 07/07/2024 Prepared by: Mliss  Exercises - Supine Heel Slide with Strap  - 1 x daily - 7 x weekly - 3 sets - 10 reps - Pillow squeezes with kegel  - 1 x daily - 7 x weekly - 3 sets - 10 reps - Sit to Stand with Counter Support  - 1 x daily - 7 x weekly - 1 sets - 10 reps - Seated Long Arc Quad  - 1 x daily - 7 x weekly - 1-3 sets - 10 reps - 5 sec hold - Seated Isometric Hip Abduction with Resistance  - 1 x daily - 7 x weekly - 1-3 sets - 10 reps - Heel Raises with Counter Support  - 1 x daily - 7 x weekly - 1-3 sets - 10 reps - Standing Terminal Knee Extension at Wall with Ball  - 2 x daily - 7 x weekly - 2 sets - 10 reps - 5 sec hold - Squat with Chair Touch and Resistance Loop  - 1 x daily - 3 x weekly - 2 sets  - 10 reps - Standing Knee Flexion with Counter Support  - 1 x daily - 3 x weekly - 2 sets - 10 reps - Supine Bridge with Resistance Band  - 1 x daily -  7 x weekly - 1-2 sets - 10 reps - 5 sec hold - Standing Hip Abduction on Slider  - 1 x daily - 3 x weekly - 2 sets - 10 reps - Calf stretch on rocker board  - 1 x daily - 7 x weekly - 1 sets - 3 reps - 30 sec  hold - Standing Anti-Rotation Press with Anchored Resistance  - 1 x daily - 3 x weekly - 2 sets - 10 reps - Standing Trunk Rotation with Resistance  - 1 x daily - 3 x weekly - 2 sets - 10 reps - Right Modified Dix Hallpike with Pillows  - 1 x daily - 1 x weekly - 1 sets - 1 reps - 30 sec hold  ASSESSMENT:  CLINICAL IMPRESSION: Pt arrives with no reports of pain and having a good week. She had sore muscles in her legs from last session which she thought was good. She was able to repeat her bicycle in forward facing plank for 2 min 2x today. No other issues or pain. Stability was good throughout.   Eval: Patient is a 59 y.o. female  who was seen today for physical therapy evaluation and tr in the teatment for poor gait mechanics, impaired posture, impaired balance, decreased hip and core strength, increased fall risk. Pt has complex history of  Radiation-induced lumbosacral plexopathy and Polyradiculoneuropathy status post cervical cancer. Pt has completed pelvic floor PT recently and had positive outcomes with this and resolution of symptoms. Pt very motivated to participate in PT for bil LE strengthening and decreased fall risk. Pt demonstrated impairments with objective measurements of TUG and 5xSTS indicative of increased fall risk and balance deficits. Pt reports she is unable to hold and carry grandchildren, to walk on uneven surfaces, needs assistance with dressing, walking, transfers sometimes and wants to be more I. Pt would benefit from additional PT to further address deficits.    OBJECTIVE IMPAIRMENTS: Abnormal gait, decreased activity  tolerance, decreased coordination, decreased endurance, decreased mobility, difficulty walking, decreased strength, increased fascial restrictions, increased muscle spasms, impaired flexibility, impaired sensation, improper body mechanics, postural dysfunction, and pain.   ACTIVITY LIMITATIONS: carrying, lifting, bending, sitting, standing, squatting, stairs, transfers, bed mobility, locomotion level, and caring for others  PARTICIPATION LIMITATIONS: meal prep, cleaning, laundry, interpersonal relationship, driving, shopping, community activity, occupation, and yard work  PERSONAL FACTORS: Fitness, Past/current experiences, Time since onset of injury/illness/exacerbation, and 1 comorbidity: medical history are also affecting patient's functional outcome.   REHAB POTENTIAL: Good  CLINICAL DECISION MAKING: Evolving/moderate complexity  EVALUATION COMPLEXITY: Moderate   GOALS: Goals reviewed with patient? Yes  SHORT TERM GOALS: Target date: 02/13/24 Pt to be I with HEP for carry over and continuing recommendations for improved outcomes.   Baseline: Goal status: MET  2.  Pt to demonstrate at least 3/5 Rt LE strength grossly and 4/5 Lt for improved pelvic stability and functional squats without falling.  Baseline:  Goal status: PARTIALLY MET 03/03/24  3.  Pt to demonstrate ability to complete static standing without AD for at least 5 mins at midline to complete gentle kitchen tasks/household tasks without fear of falling.  Baseline:  Goal status: MET  4.  Pt to demonstrate I with dynamic sitting balance to better be able to reach floor level for retrieving fallen objects and putting on socks without falling Baseline:  Goal status: MET 03/03/24  LONG TERM GOALS: Target date: 10/10/24  Pt to be I with advanced HEP for carry over and continuing  recommendations for improved outcomes.   Baseline:  Goal status: ongoing  2.  Pt to report improved LEFS score to at least 30/80 for improved I  with functional tasks at home such as dressing, walking from room to room.  Baseline:  Goal status: IN PROGRESS 28/80 06/16/24; 07/15/24  19 / 80 = 23.8 %  3.  Pt to demonstrate improved dynamic standing balance with or without AD at no more than CGA to better be able to play with grandchildren without falling.  Baseline:  Goal status: DEFERRED  4.  Pt to demonstrate improved gait mechanics with LRAD for 50 ' with minimal deviation from straight path to allow safe ambulation at home. Baseline:  Goal status: MODIFIED 07/15/24    5.  Pt to demonstrate ability to perform x5 Sit to stand with at least 10# to more safely assist in caring for grandchildren without risk of falling.  Baseline:  Goal status: DEFERRED  6.  Pt able to perform 3-5 sit to stands without UE assist showing improved functional LE strength Baseline: 1 in 30 sec on 07/15/24 Goal status: NEW  7.  Patient to report no falls when using AD at home or in the community. Baseline: using rolling walker at home and community (07/28/24) Goal status: Ongoing      PLAN:  PT FREQUENCY: 2x/week  PT DURATION: 12 weeks  PLANNED INTERVENTIONS:    97110-Therapeutic exercises, 97530- Therapeutic activity, 97112- Neuromuscular re-education, 97535- Self Care, 02859- Manual therapy, [phone]- Gait training, [phone]- Canalith repositioning, [id number]- Aquatic Therapy, [phone]- Splinting, [id number]- Electrical stimulation (manual), [id number]- Vasopneumatic device, [id number]- Ultrasound, [id number]- Traction (mechanical), [id number]- Ionotophoresis 4mg /ml Dexamethasone , Patient/Family education, Balance training, Stair training, Taping, Dry Needling, Joint mobilization, Joint manipulation, Spinal manipulation, Spinal mobilization, Scar mobilization, DME instructions, Wheelchair mobility training, Cryotherapy, Moist heat, and Biofeedback  NEXT VISIT: for aquatic PT, Work on ADDuction, core and LE strengthening and neuromuscular re-ed improved gait and mobility LAND:  continue with gait training with cane and walker, sit to stands, core and LE strength   Delon Darner, PTA 10/01/2024 8:51 AM     [facility] [address] [place], [place] [address] Phone # [phone] Fax [phone]  "

## 2024-10-02 ENCOUNTER — Encounter: Payer: Self-pay | Admitting: Family Medicine

## 2024-10-02 DIAGNOSIS — B002 Herpesviral gingivostomatitis and pharyngotonsillitis: Secondary | ICD-10-CM

## 2024-10-03 ENCOUNTER — Ambulatory Visit: Admitting: Physical Therapy

## 2024-10-03 ENCOUNTER — Encounter: Payer: Self-pay | Admitting: Physical Therapy

## 2024-10-03 DIAGNOSIS — R269 Unspecified abnormalities of gait and mobility: Secondary | ICD-10-CM

## 2024-10-03 DIAGNOSIS — M6281 Muscle weakness (generalized): Secondary | ICD-10-CM | POA: Diagnosis not present

## 2024-10-03 DIAGNOSIS — B002 Herpesviral gingivostomatitis and pharyngotonsillitis: Secondary | ICD-10-CM | POA: Insufficient documentation

## 2024-10-03 DIAGNOSIS — R293 Abnormal posture: Secondary | ICD-10-CM

## 2024-10-03 MED ORDER — ACYCLOVIR 400 MG PO TABS: 400.0000 mg | ORAL_TABLET | Freq: Three times a day (TID) | ORAL | 99 refills | Status: AC

## 2024-10-03 NOTE — Therapy (Signed)
 " OUTPATIENT PHYSICAL THERAPY TREATMENT AND RE-CERTIFICATION   Patient Name: Anna Ramsey MRN: [id number] DOB:[birth year], 59 y.o., female Today's Date: 10/03/2024  END OF SESSION:  PT End of Session - 10/03/24 0755     Visit Number 54    Date for Recertification  12/05/24    Authorization Type [employer]    PT Start Time 0755    PT Stop Time 0847    PT Time Calculation (min) 52 min    Activity Tolerance Patient tolerated treatment well    Behavior During Therapy [facility] for tasks assessed/performed                   Past Medical History:  Diagnosis Date   ADHD (attention deficit hyperactivity disorder) 2024   Dysuria    Erythematous bladder mucosa    Heart murmur    asymptomatic per pt --  no echo   Hematuria    History of cervical cancer    12/ 2014  Stage IB2--  s/p  TAH w/ BSO and pelvic lymphadectomy/  and radiation therapy   History of radiation therapy 10/20/13-11/28/13   pelvis 50.4 gray   Hypertension    Lesion of bladder    Mild intermittent asthma    Neuromuscular disorder (HCC)    Seasonal allergies    Thyroid disease    Past Surgical History:  Procedure Laterality Date   CYSTOSCOPY WITH BIOPSY N/A 04/29/2015   Procedure: CYSTOSCOPY WITH BIOPSY WITH FULGERATION;  Surgeon: Belvie LITTIE Clara, MD;  Location: [facility];  Service: Urology;  Laterality: N/A;  1 HR [phone] [id number]   FOOT SURGERY Left 2010   RADICAL ABDOMINAL HYSTERECTOMY  09-05-2013   w/ BILATERAL SALPINGOOPHORECTOMY AND PELVIC LYMPHADECTOMY   Patient Active Problem List   Diagnosis Date Noted   Oral herpes simplex infection 10/03/2024   ADD (attention deficit disorder) 04/21/2024   SUI (stress urinary incontinence, female) 11/01/2023   History of urinary retention 11/01/2023   Neurogenic bladder 10/18/2023   Hematuria 10/18/2023   Stress and adjustment reaction 01/13/2021   Inattention 10/28/2019   Hypothyroid 11/13/2016   Neuropathy involving both  lower extremities 11/13/2016   Cystitis, radiation 05/22/2014   Cervical cancer (HCC) 09/01/2013   Adenocarcinoma of cervix, 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   Asthma, mild intermittent 07/07/2009   INSOMNIA 05/28/2008   Generalized anxiety disorder 05/07/2008   HYPERTENSION, BENIGN 05/31/2007    PCP: Geraline Dorothyann JONETTA, MD  REFERRING PROVIDER: Skeet Juliene SAUNDERS, DO   REFERRING DIAG:  [phone] (ICD-10-CM) - Bilateral leg weakness G62.89 (ICD-10-CM) - 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 I feel like I'm holding steady. By 2-3 o'clock I am exhausted.    Eval: Right leg worse than left but both painful. Right has instances of knee just giving out, has neuropathy - like weakness, pain since radiation in 2015. The nerves have been affected and greatly limiting her activity. Does have symptoms of numbness, sensory deficits, weakness, ataxic gait, and painful but all vary based on the day.     Fluid intake: Yes: water    PAIN:  09/22/2024 Are you having pain? yes NPRS scale: 5/10 Pain location: B legs Pain type: Pain description: constant  Aggravating factors: walking, end of day, rainy days Relieving factors: try not to walk, decreasing activities    PRECAUTIONS: Other: cervical cancer with radiation  RED FLAGS: None   WEIGHT BEARING RESTRICTIONS: No  FALLS:  Has patient fallen in last 6 months? Yes. Number of falls 1x/weekly due to weakness and  decreased balance from having the radiation, trouble with low light - reports she does need to furniture walk to help with balance  LIVING ENVIRONMENT: Lives with: lives with their family  OCCUPATION: sits with laptop on her lap for 12 hours per day; unable to sit with legs down due to them falling asleep  PLOF: Independent  PATIENT GOALS: to strengthening enough to walk across grass, wants to have better balance, stronger legs                                        PERTINENT HISTORY:  ADHD; Cervical Cancer 2014; 9 weeks of radiation; Radical hysterectomy;    OBJECTIVE:  Note: Objective measures were completed at Evaluation unless otherwise noted.  DIAGNOSTIC FINDINGS:   PATIENT SURVEYS:  Eval:  Lower Extremity Functional Score: 5 / 80 = 6.3 % 06/16/24 LEFS 28 / 80 = 35.0 % 07/15/24:  Lower Extremity Functional Score:  19 / 80 = 23.8 % 09/22/2024:  Lower Extremity Functional Score: 18/80= 22.5%  COGNITION: Overall cognitive status: Within functional limits for tasks assessed     SENSATION: Poor sensation at Rt LE - light touch felt along posterior gastroc only no awareness without visualization of being touched at medial, lateral, anterior lower leg, reports numbness in bil feet Rt>Lt  EDEMA:  Rt ankle sometimes swells per pt  MUSCLE LENGTH: Bil hamstrings and adductors limited by 25%;  POSTURE: rounded shoulders and forward head  LOWER EXTREMITY ROM:  weakness and poor coordination/proprioception   LOWER EXTREMITY MMT:  02/04/24 hip flexion was R 3/5, L 4/5  MMT Right Eval */5 Left Eval */5 Right 5/16 Left 5/16 RIGHT 6/6 LEFT  6/6 Right 10/21 Left  10/21  Hip flexion 2 3 4+ 5 3  3  4+  Hip extension 2 3 4+ 4 4+ 4 3+ 4  Hip abduction 3 3+ 5- 5 5     Hip adduction 1+ 2+ 2+ 2+ 2+ 2+ 2+ 2+  Hip internal rotation          Hip external rotation          Knee flexion 2 3 3- 4 3+ 5 2+ 4  Knee extension 2+ 3 3- 5 3+     Ankle dorsiflexion 0 4 0 4+ long sitting 2  4  4+ long sitting 2      Ankle plantarflexion 3 4  Ankle inversion 1 4 3+ tested in long sitting 4+ tested in long sitting 3+ in long sitting 5 in long sitting    Ankle eversion 2 4 3+ tested in long sitting 4- tested in long sitting 2+ 4- tested in long sitting     (Blank rows = not tested)   FUNCTIONAL TESTS:  Five times Sit to Stand Test (FTSS) Method: Use a straight back chair with a solid seat that is 16-18 high. Ask participant to sit on the chair with arms folded across their chest.   Instructions: Stand up and sit down as quickly as possible 5 times, keeping your arms folded across your chest.   Measurement: Stop timing when the participant stands the 5th time.  TIME: ______ (in seconds)  Times > 13.6 seconds is associated with increased disability and morbidity (Guralnik, 2000) Times > 15 seconds is predictive of recurrent falls in healthy individuals aged 28 and older (Buatois, et al., 2008) Normal performance values in community dwelling individuals aged 33 and older (Bohannon, 2006): 50-59 years: 8 seconds 60-69 years: 11.4 seconds 70-79 years: 12.6 seconds 80-89 years: 14.8 seconds  MCID: >= 2.3 seconds for Vestibular Disorders (Meretta, 2006)  5 times sit to stand: 22s with hands - very unstable with poor hip, posture, core control  Timed up and go (TUG): 28s with SBQC - min A   07/15/24 37.10 sec (one stand with no UE support, 4 with B UE support and last two with min A upon standing)  09/22/2024: 5 times sit to stand:  13.02 sec with decreased muscle control noted Timed up and go (TUG):18.59 sec without assistive device   GAIT: Distance walked: 200' Assistive device utilized: [employer] Level of assistance: CGA Comments: Rt LE demonstrating ataxic pattern, Lt trunk lean intermittently  to right self, poor step height bil, decreased stride length, limited glute activation    06/16/24 266 ft with SPC, improved step height and step  length, intermittent LOB if pt turns to speak to therapist, but pt able to right herself independently. Minimal trunk lean.                                                                                                                               TREATMENT DATE:    10/03/24: Bike L0 x 2 min then fatigues Gait to Cancer gym x2 with RW 10 sit to stand with UE support in 30 sec. Side stepping on AirEx beam in parallel bars with UE support as needed down and back x3 laps FWD step over 4 small hurdles in parallel bars with UE support as needed down and back x3 laps Side stepping  over 4 small hurdles in parallel bars with UE support as needed down and back x3 laps Free Motion Machine: Standing on purple balance pad for scap retract with 3#  2x10  Free Motion Machine: Standing on pad in walking stance with row x 10 bilat  Free Motion Machine: Standing on pad shoulder D2 with 3# x 10 bilat Free Motion Machine: Standing Unilateral shoulder ext with contralateral march x 10 bilat less reps on left side and post tilts pelvis Stretching intermittently Discussion of POC, status and goals   10/01/24:t arrives for aquatic physical therapy. Treatment took place in 3.5-5.5 feet of water . Water  temperature was 91 degrees f. Pt entered the pool via steps independently with use of bil rails, HHA to get from walker to stairs. Pt requires buoyancy of water  for support and to offload joints with strengthening exercises.  Pt utilizes viscosity of the water  required for strengthening. Seated water  bench with 75% submersion for LE AROM/warm up/discussion of status.    -60% water  depth water  walking with rainbow floats  8x each direction with Vc for control of LE and really focusing on not circumducting her RTLE.  -static core with arm sway hold pink bells x20, then tandem stance 20x each side -forward facing plank on water  bench; knees in and out 10x then bicycle 2 min. Rest, repeat.VC for hips up -Plank with RB hand  weights, toes on floor  1 min 3x -Static hip add/abd 15x2 Bil  09/24/24:Pt arrives for aquatic physical therapy. Treatment took place in 3.5-5.5 feet of water . Water  temperature was 91 degrees f. Pt entered the pool via steps independently with use of bil rails, HHA to get from walker to stairs. Pt requires buoyancy of water  for support and to offload joints with strengthening exercises.  Pt utilizes viscosity of the water  required for strengthening. Seated water  bench with 75% submersion: forward facing plank: knees in & out 10x f/b bicycle 2 min, rest, then 1 additional min.    -60% water  depth water  walking with rainbow floats  8x each direction with Vc for control of LE and really focusing on not circumducting her RTLE.  -static core with arm sway hold pink bells x20, then tandem stance 20x each side -forward facing plank on water  bench; knees in and out 10x then bicycle 30 sec. Rest, repeat 2x more.VC for hips up -Plank with RB hand weights, toes on floor  1 min 3x -Static hip add/abd 15x2 Bil  09/22/2024: Nustep level 5 x6 min with PT present to discuss status 5 times sit to stand, TUG Side stepping on AirEx beam in parallel bars with UE support as needed down and back x3 laps FWD step over 4 small hurdles in parallel bars with UE support as needed down and back x3 laps Side stepping  over 4 small hurdles in parallel bars with UE support as needed down and back x3 laps Free Motion Machine: Standing on purple balance pad for scap retract with 3#  2x10  Free Motion Machine: Standing shoulder D2 with 3# x 10 bilat Sit to/from stand with yellow tband around knees with PT providing tactile cuing for knee placement x10 stands  PATIENT EDUCATION:  Education details: [email or web address] - uses App Person educated: Patient Education method: Explanation, Demonstration, Tactile cues, Verbal cues, and Handouts Education comprehension: verbalized understanding, returned demonstration, verbal cues required,  tactile cues required, and needs further education  HOME EXERCISE PROGRAM: Access Code: [email or web address] URL: [email or web address] Date: 07/07/2024 Prepared by: Mliss  Exercises - Supine Heel Slide with Strap  - 1 x daily - 7 x weekly - 3 sets - 10 reps - Pillow squeezes with kegel  - 1 x daily - 7 x weekly - 3 sets - 10 reps - Sit to Stand with Counter Support  -  1 x daily - 7 x weekly - 1 sets - 10 reps - Seated Long Arc Quad  - 1 x daily - 7 x weekly - 1-3 sets - 10 reps - 5 sec hold - Seated Isometric Hip Abduction with Resistance  - 1 x daily - 7 x weekly - 1-3 sets - 10 reps - Heel Raises with Counter Support  - 1 x daily - 7 x weekly - 1-3 sets - 10 reps - Standing Terminal Knee Extension at Wall with Ball  - 2 x daily - 7 x weekly - 2 sets - 10 reps - 5 sec hold - Squat with Chair Touch and Resistance Loop  - 1 x daily - 3 x weekly - 2 sets - 10 reps - Standing Knee Flexion with Counter Support  - 1 x daily - 3 x weekly - 2 sets - 10 reps - Supine Bridge with Resistance Band  - 1 x daily - 7 x weekly - 1-2 sets - 10 reps - 5 sec hold - Standing Hip Abduction on Slider  - 1 x daily - 3 x weekly - 2 sets - 10 reps - Calf stretch on rocker board  - 1 x daily - 7 x weekly - 1 sets - 3 reps - 30 sec  hold - Standing Anti-Rotation Press with Anchored Resistance  - 1 x daily - 3 x weekly - 2 sets - 10 reps - Standing Trunk Rotation with Resistance  - 1 x daily - 3 x weekly - 2 sets - 10 reps - Right Modified Dix Hallpike with Pillows  - 1 x daily - 1 x weekly - 1 sets - 1 reps - 30 sec hold  ASSESSMENT:  CLINICAL IMPRESSION: Patient saw neurologist and he felt she may benefit from another day of therapy. Patient reports 1-2 falls per week even with AD. She feels she is maintaining and not moving backwards as at last assessment. She reports improvements in gait pattern since using the walker as she needs to keep her hips in alignment. She is unlikely to meet sit to stand goal  without UE support. She demonstrates improved LE strength with UE support as evidenced my the number of reps she can now complete. She demonstrates significant increase in core strength and is able to progress therapeutic activities during her session. She still has weakness in her R LE limiting her ability to clear obstacles (this is often a reason for her falls). Aquatic PT has been very beneficial for her and I recommend she increase her frequency in the pool to 3x/wk to maintain and hopefully increase her strength for functional gains.    Eval: Patient is a 59 y.o. female  who was seen today for physical therapy evaluation and tr in the teatment for poor gait mechanics, impaired posture, impaired balance, decreased hip and core strength, increased fall risk. Pt has complex history of  Radiation-induced lumbosacral plexopathy and Polyradiculoneuropathy status post cervical cancer. Pt has completed pelvic floor PT recently and had positive outcomes with this and resolution of symptoms. Pt very motivated to participate in PT for bil LE strengthening and decreased fall risk. Pt demonstrated impairments with objective measurements of TUG and 5xSTS indicative of increased fall risk and balance deficits. Pt reports she is unable to hold and carry grandchildren, to walk on uneven surfaces, needs assistance with dressing, walking, transfers sometimes and wants to be more I. Pt would benefit from additional PT to further address deficits.  OBJECTIVE IMPAIRMENTS: Abnormal gait, decreased activity tolerance, decreased coordination, decreased endurance, decreased mobility, difficulty walking, decreased strength, increased fascial restrictions, increased muscle spasms, impaired flexibility, impaired sensation, improper body mechanics, postural dysfunction, and pain.   ACTIVITY LIMITATIONS: carrying, lifting, bending, sitting, standing, squatting, stairs, transfers, bed mobility, locomotion level, and caring for  others  PARTICIPATION LIMITATIONS: meal prep, cleaning, laundry, interpersonal relationship, driving, shopping, community activity, occupation, and yard work  PERSONAL FACTORS: Fitness, Past/current experiences, Time since onset of injury/illness/exacerbation, and 1 comorbidity: medical history are also affecting patient's functional outcome.   REHAB POTENTIAL: Good  CLINICAL DECISION MAKING: Evolving/moderate complexity  EVALUATION COMPLEXITY: Moderate   GOALS: Goals reviewed with patient? Yes  SHORT TERM GOALS: Target date: 02/13/24 Pt to be I with HEP for carry over and continuing recommendations for improved outcomes.   Baseline: Goal status: MET  2.  Pt to demonstrate at least 3/5 Rt LE strength grossly and 4/5 Lt for improved pelvic stability and functional squats without falling.  Baseline:  Goal status:DEFERRED  3.  Pt to demonstrate ability to complete static standing without AD for at least 5 mins at midline to complete gentle kitchen tasks/household tasks without fear of falling.  Baseline:  Goal status: MET  4.  Pt to demonstrate I with dynamic sitting balance to better be able to reach floor level for retrieving fallen objects and putting on socks without falling Baseline:  Goal status: MET 03/03/24  LONG TERM GOALS: Target date: 12/05/24  Pt to be I with advanced HEP for carry over and continuing recommendations for improved outcomes.   Baseline:  Goal status: ongoing  2.  Pt to report improved LEFS score to at least 30/80 for improved I with functional tasks at home such as dressing, walking from room to room.  Baseline:  Goal status: IN PROGRESS 28/80 06/16/24; 07/15/24  19 / 80 = 23.8 %  3.  Pt to demonstrate improved dynamic standing balance with or without AD at no more than CGA to better be able to play with grandchildren without falling.  Baseline:  Goal status: DEFERRED  4.  Pt to demonstrate improved gait mechanics with LRAD for 50 ' with minimal  deviation from straight path to allow safe ambulation at home. Baseline:  Goal status: MET 10/03/24   5.  Pt to demonstrate ability to perform x5 Sit to stand with at least 10# to more safely assist in caring for grandchildren without risk of falling.  Baseline:  Goal status: DEFERRED  6.  Pt able to perform 15 sit to stands in 30 sec  without UE assist showing improved functional LE strength Baseline: 10 reps in 30 sec 10/03/24 Goal status: MODIFIED 10/03/24  7.  Patient to report no falls when using AD at home or in the community. Baseline: using rolling walker at home and community (07/28/24) Goal status: ONGOING  10/03/24  8.  Increase endurance with walker to allow shopping. .  Baseline: Patient able to walk from parking lot to pool and is exhausted. Goal status: NEW  9. Improved ability to clear hurdles x 3 with good form and minimal compensation.   Baseline: Able to clear 2 hurdles Goal status: NEW      PLAN:  PT FREQUENCY: 3x/week  PT DURATION: 8 weeks  PLANNED INTERVENTIONS:    97110-Therapeutic exercises, 97530- Therapeutic activity, [id number]- Neuromuscular re-education, 97535- Self Care, 02859- Manual therapy, [id number]- Gait training, [id number]- Canalith repositioning, [id number]- Aquatic Therapy, 97760- Splinting, [id number]- Electrical stimulation (manual), [id number]- Vasopneumatic device, [id number]-  Ultrasound, 02987- Traction (mechanical), [id number]- Ionotophoresis 4mg /ml Dexamethasone , Patient/Family education, Balance training, Stair training, Taping, Dry Needling, Joint mobilization, Joint manipulation, Spinal manipulation, Spinal mobilization, Scar mobilization, DME instructions, Wheelchair mobility training, Cryotherapy, Moist heat, and Biofeedback  NEXT VISIT: for aquatic PT, Work on ADDuction, core and LE strengthening and neuromuscular re-ed improved gait and mobility LAND: continue with gait training with  walker, sit to stands with UE support, core and LE strength (hip flexion/ext)  Mliss Cummins, PT  10/03/2024 12:14 PM     [facility] [address], [address] [place], [place] [address] Phone # [phone] Fax [phone]  "

## 2024-10-06 ENCOUNTER — Encounter: Payer: Self-pay | Admitting: Physical Therapy

## 2024-10-06 ENCOUNTER — Ambulatory Visit: Admitting: Physical Therapy

## 2024-10-06 ENCOUNTER — Encounter: Payer: Self-pay | Admitting: *Deleted

## 2024-10-06 DIAGNOSIS — M6281 Muscle weakness (generalized): Secondary | ICD-10-CM

## 2024-10-06 DIAGNOSIS — R269 Unspecified abnormalities of gait and mobility: Secondary | ICD-10-CM

## 2024-10-06 DIAGNOSIS — R293 Abnormal posture: Secondary | ICD-10-CM

## 2024-10-06 NOTE — Therapy (Signed)
 " OUTPATIENT PHYSICAL THERAPY TREATMENT    Patient Name: Anna Ramsey MRN: [id number] DOB:[birth year], 59 y.o., female Today's Date: 10/06/2024  END OF SESSION:  PT End of Session - 10/06/24 0802     Visit Number 55    Date for Recertification  12/05/24    Authorization Type [employer]    PT Start Time 0800    PT Stop Time 0845    PT Time Calculation (min) 45 min    Activity Tolerance Patient tolerated treatment well    Behavior During Therapy [facility] for tasks assessed/performed                    Past Medical History:  Diagnosis Date   ADHD (attention deficit hyperactivity disorder) 2024   Dysuria    Erythematous bladder mucosa    Heart murmur    asymptomatic per pt --  no echo   Hematuria    History of cervical cancer    12/ 2014  Stage IB2--  s/p  TAH w/ BSO and pelvic lymphadectomy/  and radiation therapy   History of radiation therapy 10/20/13-11/28/13   pelvis 50.4 gray   Hypertension    Lesion of bladder    Mild intermittent asthma    Neuromuscular disorder (HCC)    Seasonal allergies    Thyroid disease    Past Surgical History:  Procedure Laterality Date   CYSTOSCOPY WITH BIOPSY N/A 04/29/2015   Procedure: CYSTOSCOPY WITH BIOPSY WITH FULGERATION;  Surgeon: Belvie LITTIE Clara, MD;  Location: [facility];  Service: Urology;  Laterality: N/A;  1 HR [phone] [id number]   FOOT SURGERY Left 2010   RADICAL ABDOMINAL HYSTERECTOMY  09-05-2013   w/ BILATERAL SALPINGOOPHORECTOMY AND PELVIC LYMPHADECTOMY   Patient Active Problem List   Diagnosis Date Noted   Oral herpes simplex infection 10/03/2024   ADD (attention deficit disorder) 04/21/2024   SUI (stress urinary incontinence, female) 11/01/2023   History of urinary retention 11/01/2023   Neurogenic bladder 10/18/2023   Hematuria 10/18/2023   Stress and adjustment reaction 01/13/2021   Inattention 10/28/2019   Hypothyroid 11/13/2016   Neuropathy involving both lower extremities  11/13/2016   Cystitis, radiation 05/22/2014   Cervical cancer (HCC) 09/01/2013   Adenocarcinoma of cervix, 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   Asthma, mild intermittent 07/07/2009   INSOMNIA 05/28/2008   Generalized anxiety disorder 05/07/2008   HYPERTENSION, BENIGN 05/31/2007    PCP: Geraline Dorothyann JONETTA, MD  REFERRING PROVIDER: Skeet Juliene SAUNDERS, DO   REFERRING DIAG:  [phone] (ICD-10-CM) - Bilateral leg weakness G62.89 (ICD-10-CM) - 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 Better Sunday. I do the bicycle until I tire and then repeat. Also doing pushups x 15, 2 sets.   Eval: Right leg worse than left but both painful. Right has instances of knee just giving out, has neuropathy - like weakness, pain since radiation in 2015. The nerves have been affected and greatly limiting her activity. Does have symptoms of numbness, sensory deficits, weakness, ataxic gait, and painful but all vary based on the day.     Fluid intake: Yes: water    PAIN:  09/22/2024 Are you having pain? yes NPRS scale: 5/10 Pain location: B legs Pain type: Pain description: constant  Aggravating factors: walking, end of day, rainy days Relieving factors: try not to walk, decreasing activities    PRECAUTIONS: Other: cervical cancer with radiation  RED FLAGS: None   WEIGHT BEARING RESTRICTIONS: No  FALLS:  Has patient fallen in last 6 months? Yes. Number of falls 1x/weekly due to weakness and  decreased balance from having the radiation, trouble with low light - reports she does need to furniture walk to help with balance  LIVING ENVIRONMENT: Lives with: lives with their family  OCCUPATION: sits with laptop on her lap for 12 hours per day; unable to sit with legs down due to them falling asleep  PLOF: Independent  PATIENT GOALS: to strengthening enough to walk across grass, wants to have better balance, stronger legs                                        PERTINENT HISTORY:  ADHD; Cervical Cancer 2014; 9 weeks of radiation; Radical hysterectomy;    OBJECTIVE:  Note: Objective measures were completed at Evaluation unless otherwise noted.  DIAGNOSTIC FINDINGS:   PATIENT SURVEYS:  Eval:  Lower Extremity Functional Score: 5 / 80 = 6.3 % 06/16/24 LEFS 28 / 80 = 35.0 % 07/15/24:  Lower Extremity Functional Score:  19 / 80 = 23.8 % 09/22/2024:  Lower Extremity Functional Score: 18/80= 22.5%  COGNITION: Overall cognitive status: Within functional limits for tasks assessed     SENSATION: Poor sensation at Rt LE - light touch felt along posterior gastroc only no awareness without visualization of being touched at medial, lateral, anterior lower leg, reports numbness in bil feet Rt>Lt  EDEMA:  Rt ankle sometimes swells per pt  MUSCLE LENGTH: Bil hamstrings and adductors limited by 25%;  POSTURE: rounded shoulders and forward head  LOWER EXTREMITY ROM:  weakness and poor coordination/proprioception   LOWER EXTREMITY MMT:  02/04/24 hip flexion was R 3/5, L 4/5  MMT Right Eval */5 Left Eval */5 Right 5/16 Left 5/16 RIGHT 6/6 LEFT  6/6 Right 10/21 Left  10/21  Hip flexion 2 3 4+ 5 3  3  4+  Hip extension 2 3 4+ 4 4+ 4 3+ 4  Hip abduction 3 3+ 5- 5 5     Hip adduction 1+ 2+ 2+ 2+ 2+ 2+ 2+ 2+  Hip internal rotation          Hip external rotation          Knee flexion 2 3 3- 4 3+ 5 2+ 4  Knee extension 2+ 3 3- 5 3+     Ankle dorsiflexion 0 4 0 4+ long sitting 2  4  4+ long sitting 2      Ankle plantarflexion 3 4  Ankle inversion 1 4 3+ tested in long sitting 4+ tested in long sitting 3+ in long sitting 5 in long sitting    Ankle eversion 2 4 3+ tested in long sitting 4- tested in long sitting 2+ 4- tested in long sitting     (Blank rows = not tested)   FUNCTIONAL TESTS:  Five times Sit to Stand Test (FTSS) Method: Use a straight back chair with a solid seat that is 16-18 high. Ask participant to sit on the chair with arms folded across their chest.   Instructions: Stand up and sit down as quickly as possible 5 times, keeping your arms folded across your chest.   Measurement: Stop timing when the participant stands the 5th time.  TIME: ______ (in seconds)  Times > 13.6 seconds is associated with increased disability and morbidity (Guralnik, 2000) Times > 15 seconds is predictive of recurrent falls in healthy individuals aged 57 and older (Buatois, et al., 2008) Normal performance values in community dwelling individuals aged 54 and older (Bohannon, 2006): 50-59 years: 8 seconds 60-69 years: 11.4 seconds 70-79 years: 12.6 seconds 80-89 years: 14.8 seconds  MCID: >= 2.3 seconds for Vestibular Disorders (Meretta, 2006)  5 times sit to stand: 22s with hands - very unstable with poor hip, posture, core control  Timed up and go (TUG): 28s with SBQC - min A   07/15/24 37.10 sec (one stand with no UE support, 4 with B UE support and last two with min A upon standing)  09/22/2024: 5 times sit to stand:  13.02 sec with decreased muscle control noted Timed up and go (TUG):18.59 sec without assistive device   GAIT: Distance walked: 200' Assistive device utilized: [employer] Level of assistance: CGA Comments: Rt LE demonstrating ataxic pattern, Lt trunk lean intermittently  to right self, poor step height bil, decreased stride length, limited glute activation    06/16/24 266 ft with SPC, improved step height and step  length, intermittent LOB if pt turns to speak to therapist, but pt able to right herself independently. Minimal trunk lean.                                                                                                                               TREATMENT DATE:   10/06/24: Nustep L5 x 6 min Gait to Cancer gym x with RW 10 sit to stand with UE support staggered stance L back, then with chair + foam with R foot back x 8 Side stepping on AirEx beam in parallel bars with UE support as needed down and back x3 laps FWD step over 4 small hurdles in parallel bars with UE support as needed down and back x3 laps Side stepping  over 4 small hurdles in parallel bars with UE support as needed down and back x3 laps Free Motion Machine: Standing on purple balance pad for scap retract with 3#  2x10  Free Motion Machine: Standing on  pad in walking stance with row x 10 bilat Free Motion Machine: Standing on pad shoulder D2 with 3# x 10 bilat Free Motion Machine: Standing Unilateral shoulder ext with contralateral march x 10 bilat cued to avoid post tilt when marching R Stretching intermittently    10/03/24: Bike L0 x 2 min then fatigues Gait to Cancer gym x2 with RW 10 sit to stand with UE support in 30 sec. Side stepping on AirEx beam in parallel bars with UE support as needed down and back x3 laps FWD step over 4 small hurdles in parallel bars with UE support as needed down and back x3 laps Side stepping  over 4 small hurdles in parallel bars with UE support as needed down and back x3 laps Free Motion Machine: Standing on purple balance pad for scap retract with 3#  2x10  Free Motion Machine: Standing on pad in walking stance with row x 10 bilat Free Motion Machine: Standing on pad shoulder D2 with 3# x 10 bilat Free Motion Machine: Standing Unilateral shoulder ext with contralateral march x 10 bilat less reps on left side and post tilts pelvis Stretching intermittently Discussion of POC, status and  goals   10/01/24:t arrives for aquatic physical therapy. Treatment took place in 3.5-5.5 feet of water . Water  temperature was 91 degrees f. Pt entered the pool via steps independently with use of bil rails, HHA to get from walker to stairs. Pt requires buoyancy of water  for support and to offload joints with strengthening exercises.  Pt utilizes viscosity of the water  required for strengthening. Seated water  bench with 75% submersion for LE AROM/warm up/discussion of status.    -60% water  depth water  walking with rainbow floats  8x each direction with Vc for control of LE and really focusing on not circumducting her RTLE.  -static core with arm sway hold pink bells x20, then tandem stance 20x each side -forward facing plank on water  bench; knees in and out 10x then bicycle 2 min. Rest, repeat.VC for hips up -Plank with RB hand weights, toes on floor  1 min 3x -Static hip add/abd 15x2 Bil  09/24/24:Pt arrives for aquatic physical therapy. Treatment took place in 3.5-5.5 feet of water . Water  temperature was 91 degrees f. Pt entered the pool via steps independently with use of bil rails, HHA to get from walker to stairs. Pt requires buoyancy of water  for support and to offload joints with strengthening exercises.  Pt utilizes viscosity of the water  required for strengthening. Seated water  bench with 75% submersion: forward facing plank: knees in & out 10x f/b bicycle 2 min, rest, then 1 additional min.    -60% water  depth water  walking with rainbow floats  8x each direction with Vc for control of LE and really focusing on not circumducting her RTLE.  -static core with arm sway hold pink bells x20, then tandem stance 20x each side -forward facing plank on water  bench; knees in and out 10x then bicycle 30 sec. Rest, repeat 2x more.VC for hips up -Plank with RB hand weights, toes on floor  1 min 3x -Static hip add/abd 15x2 Bil   PATIENT EDUCATION:  Education details: [email or web address] - uses App Person  educated: Patient Education method: Explanation, Demonstration, Tactile cues, Verbal cues, and Handouts Education comprehension: verbalized understanding, returned demonstration, verbal cues required, tactile cues required, and needs further education  HOME EXERCISE PROGRAM: Access Code: [email or web address] URL: [email or web address] Date: 07/07/2024 Prepared by: Mliss  Exercises - Supine Heel Slide with Strap  - 1  x daily - 7 x weekly - 3 sets - 10 reps - Pillow squeezes with kegel  - 1 x daily - 7 x weekly - 3 sets - 10 reps - Sit to Stand with Counter Support  - 1 x daily - 7 x weekly - 1 sets - 10 reps - Seated Long Arc Quad  - 1 x daily - 7 x weekly - 1-3 sets - 10 reps - 5 sec hold - Seated Isometric Hip Abduction with Resistance  - 1 x daily - 7 x weekly - 1-3 sets - 10 reps - Heel Raises with Counter Support  - 1 x daily - 7 x weekly - 1-3 sets - 10 reps - Standing Terminal Knee Extension at Wall with Ball  - 2 x daily - 7 x weekly - 2 sets - 10 reps - 5 sec hold - Squat with Chair Touch and Resistance Loop  - 1 x daily - 3 x weekly - 2 sets - 10 reps - Standing Knee Flexion with Counter Support  - 1 x daily - 3 x weekly - 2 sets - 10 reps - Supine Bridge with Resistance Band  - 1 x daily - 7 x weekly - 1-2 sets - 10 reps - 5 sec hold - Standing Hip Abduction on Slider  - 1 x daily - 3 x weekly - 2 sets - 10 reps - Calf stretch on rocker board  - 1 x daily - 7 x weekly - 1 sets - 3 reps - 30 sec  hold - Standing Anti-Rotation Press with Anchored Resistance  - 1 x daily - 3 x weekly - 2 sets - 10 reps - Standing Trunk Rotation with Resistance  - 1 x daily - 3 x weekly - 2 sets - 10 reps - Right Modified Dix Hallpike with Pillows  - 1 x daily - 1 x weekly - 1 sets - 1 reps - 30 sec hold  ASSESSMENT:  CLINICAL IMPRESSION: Patient did very well today with all TE. She was able to stabilize her pelvis on the R with standing marches. Good clearance of hurdles with no heel lift on  left. Pt still fatigues after 3 laps in hip flexors.    Eval: Patient is a 59 y.o. female  who was seen today for physical therapy evaluation and tr in the teatment for poor gait mechanics, impaired posture, impaired balance, decreased hip and core strength, increased fall risk. Pt has complex history of  Radiation-induced lumbosacral plexopathy and Polyradiculoneuropathy status post cervical cancer. Pt has completed pelvic floor PT recently and had positive outcomes with this and resolution of symptoms. Pt very motivated to participate in PT for bil LE strengthening and decreased fall risk. Pt demonstrated impairments with objective measurements of TUG and 5xSTS indicative of increased fall risk and balance deficits. Pt reports she is unable to hold and carry grandchildren, to walk on uneven surfaces, needs assistance with dressing, walking, transfers sometimes and wants to be more I. Pt would benefit from additional PT to further address deficits.    OBJECTIVE IMPAIRMENTS: Abnormal gait, decreased activity tolerance, decreased coordination, decreased endurance, decreased mobility, difficulty walking, decreased strength, increased fascial restrictions, increased muscle spasms, impaired flexibility, impaired sensation, improper body mechanics, postural dysfunction, and pain.   ACTIVITY LIMITATIONS: carrying, lifting, bending, sitting, standing, squatting, stairs, transfers, bed mobility, locomotion level, and caring for others  PARTICIPATION LIMITATIONS: meal prep, cleaning, laundry, interpersonal relationship, driving, shopping, community activity, occupation, and yard work  PERSONAL  FACTORS: Fitness, Past/current experiences, Time since onset of injury/illness/exacerbation, and 1 comorbidity: medical history are also affecting patient's functional outcome.   REHAB POTENTIAL: Good  CLINICAL DECISION MAKING: Evolving/moderate complexity  EVALUATION COMPLEXITY: Moderate   GOALS: Goals reviewed  with patient? Yes  SHORT TERM GOALS: Target date: 02/13/24 Pt to be I with HEP for carry over and continuing recommendations for improved outcomes.   Baseline: Goal status: MET  2.  Pt to demonstrate at least 3/5 Rt LE strength grossly and 4/5 Lt for improved pelvic stability and functional squats without falling.  Baseline:  Goal status:DEFERRED  3.  Pt to demonstrate ability to complete static standing without AD for at least 5 mins at midline to complete gentle kitchen tasks/household tasks without fear of falling.  Baseline:  Goal status: MET  4.  Pt to demonstrate I with dynamic sitting balance to better be able to reach floor level for retrieving fallen objects and putting on socks without falling Baseline:  Goal status: MET 03/03/24  LONG TERM GOALS: Target date: 12/05/24  Pt to be I with advanced HEP for carry over and continuing recommendations for improved outcomes.   Baseline:  Goal status: ongoing  2.  Pt to report improved LEFS score to at least 30/80 for improved I with functional tasks at home such as dressing, walking from room to room.  Baseline:  Goal status: IN PROGRESS 28/80 06/16/24; 07/15/24  19 / 80 = 23.8 %  3.  Pt to demonstrate improved dynamic standing balance with or without AD at no more than CGA to better be able to play with grandchildren without falling.  Baseline:  Goal status: DEFERRED  4.  Pt to demonstrate improved gait mechanics with LRAD for 50 ' with minimal deviation from straight path to allow safe ambulation at home. Baseline:  Goal status: MET 10/03/24   5.  Pt to demonstrate ability to perform x5 Sit to stand with at least 10# to more safely assist in caring for grandchildren without risk of falling.  Baseline:  Goal status: DEFERRED  6.  Pt able to perform 15 sit to stands in 30 sec  without UE assist showing improved functional LE strength Baseline: 10 reps in 30 sec 10/03/24 Goal status: MODIFIED 10/03/24  7.  Patient to report no falls  when using AD at home or in the community. Baseline: using rolling walker at home and community (07/28/24) Goal status: ONGOING  10/03/24  8.  Increase endurance with walker to allow shopping. .  Baseline: Patient able to walk from parking lot to pool and is exhausted. Goal status: NEW  9. Improved ability to clear hurdles x 3 with good form and minimal compensation.   Baseline: Able to clear 2 hurdles Goal status: NEW      PLAN:  PT FREQUENCY: 3x/week  PT DURATION: 8 weeks  PLANNED INTERVENTIONS:    97110-Therapeutic exercises, 97530- Therapeutic activity, [id number]- Neuromuscular re-education, 97535- Self Care, [id number]- Manual therapy, [id number]- Gait training, [phone]- Canalith repositioning, [id number]- Aquatic Therapy, [phone]- Splinting, [id number]- Electrical stimulation (manual), [id number]- Vasopneumatic device, [id number]- Ultrasound, [id number]- Traction (mechanical), [id number]- Ionotophoresis 4mg /ml Dexamethasone , Patient/Family education, Balance training, Stair training, Taping, Dry Needling, Joint mobilization, Joint manipulation, Spinal manipulation, Spinal mobilization, Scar mobilization, DME instructions, Wheelchair mobility training, Cryotherapy, Moist heat, and Biofeedback  NEXT VISIT: for aquatic PT, Work on ADDuction, core and LE strengthening and neuromuscular re-ed improved gait and mobility LAND: continue with gait training with  walker, sit to stands with UE support, core and  LE strength (hip flexion/ext)  Mliss Cummins, PT  10/06/2024 1:20 PM     [facility] [address] [place], [place] [address] Phone # [phone] Fax [phone]  "

## 2024-10-09 ENCOUNTER — Encounter: Payer: Self-pay | Admitting: Family Medicine

## 2024-10-10 ENCOUNTER — Ambulatory Visit (HOSPITAL_BASED_OUTPATIENT_CLINIC_OR_DEPARTMENT_OTHER): Payer: Self-pay | Attending: Physical Therapy | Admitting: Physical Therapy

## 2024-10-10 ENCOUNTER — Encounter (HOSPITAL_BASED_OUTPATIENT_CLINIC_OR_DEPARTMENT_OTHER): Payer: Self-pay | Admitting: Physical Therapy

## 2024-10-10 DIAGNOSIS — R293 Abnormal posture: Secondary | ICD-10-CM | POA: Diagnosis present

## 2024-10-10 DIAGNOSIS — R269 Unspecified abnormalities of gait and mobility: Secondary | ICD-10-CM | POA: Diagnosis present

## 2024-10-10 DIAGNOSIS — M6281 Muscle weakness (generalized): Secondary | ICD-10-CM | POA: Insufficient documentation

## 2024-10-10 MED ORDER — BUPROPION HCL ER (XL) 150 MG PO TB24: 150.0000 mg | ORAL_TABLET | Freq: Every day | ORAL | 1 refills | Status: AC

## 2024-10-10 NOTE — Therapy (Signed)
 " OUTPATIENT PHYSICAL THERAPY TREATMENT    Patient Name: Anna Ramsey MRN: [id number] DOB:10-31-1965, 59 y.o., female Today's Date: 10/10/2024  END OF SESSION:  PT End of Session - 10/10/24 1145     Visit Number 56    Date for Recertification  12/05/24    Authorization Type [employer]    PT Start Time [phone]    PT Stop Time 0930    PT Time Calculation (min) 43 min    Activity Tolerance Patient tolerated treatment well    Behavior During Therapy [facility] for tasks assessed/performed                     Past Medical History:  Diagnosis Date   ADHD (attention deficit hyperactivity disorder) 2024   Dysuria    Erythematous bladder mucosa    Heart murmur    asymptomatic per pt --  no echo   Hematuria    History of cervical cancer    12/ 2014  Stage IB2--  s/p  TAH w/ BSO and pelvic lymphadectomy/  and radiation therapy   History of radiation therapy 10/20/13-11/28/13   pelvis 50.4 gray   Hypertension    Lesion of bladder    Mild intermittent asthma    Neuromuscular disorder (HCC)    Seasonal allergies    Thyroid disease    Past Surgical History:  Procedure Laterality Date   CYSTOSCOPY WITH BIOPSY N/A 04/29/2015   Procedure: CYSTOSCOPY WITH BIOPSY WITH FULGERATION;  Surgeon: Belvie LITTIE Clara, MD;  Location: [facility];  Service: Urology;  Laterality: N/A;  1 HR [phone] [id number]   FOOT SURGERY Left 2010   RADICAL ABDOMINAL HYSTERECTOMY  09-05-2013   w/ BILATERAL SALPINGOOPHORECTOMY AND PELVIC LYMPHADECTOMY   Patient Active Problem List   Diagnosis Date Noted   Oral herpes simplex infection 10/03/2024   ADD (attention deficit disorder) 04/21/2024   SUI (stress urinary incontinence, female) 11/01/2023   History of urinary retention 11/01/2023   Neurogenic bladder 10/18/2023   Hematuria 10/18/2023   Stress and adjustment reaction 01/13/2021   Inattention 10/28/2019   Hypothyroid 11/13/2016   Neuropathy involving both lower extremities  11/13/2016   Cystitis, radiation 05/22/2014   Cervical cancer (HCC) 09/01/2013   Adenocarcinoma of cervix, 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   Asthma, mild intermittent 07/07/2009   INSOMNIA 05/28/2008   Generalized anxiety disorder 05/07/2008   HYPERTENSION, BENIGN 05/31/2007    PCP: Geraline Dorothyann JONETTA, MD  REFERRING PROVIDER: Skeet Juliene SAUNDERS, DO   REFERRING DIAG:  [phone] (ICD-10-CM) - Bilateral leg weakness G62.89 (ICD-10-CM) - 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 Right has instances of knee just giving out, has neuropathy - like weakness, pain since radiation in 2015. The nerves have been affected and greatly limiting her activity. Does have symptoms of numbness, sensory deficits, weakness, ataxic gait, and painful but all vary based on the day.     Fluid intake: Yes: water    PAIN:  09/22/2024 Are you having pain? yes NPRS scale: 5/10 Pain location: B legs Pain type: Pain description: constant  Aggravating factors: walking, end of day, rainy days Relieving factors: try not to walk, decreasing activities    PRECAUTIONS: Other: cervical cancer with radiation  RED FLAGS: None   WEIGHT BEARING RESTRICTIONS: No  FALLS:  Has patient fallen in last 6 months? Yes. Number of falls 1x/weekly due to weakness and decreased balance from having the radiation, trouble with low  light - reports she does need to furniture walk to help with balance  LIVING ENVIRONMENT: Lives with: lives with their family  OCCUPATION: sits with laptop on her lap for 12 hours per day; unable to sit with legs down due to them falling asleep  PLOF: Independent  PATIENT GOALS: to strengthening enough to walk across grass, wants to have better balance, stronger legs                                        PERTINENT HISTORY:  ADHD; Cervical Cancer 2014; 9 weeks of radiation; Radical hysterectomy;    OBJECTIVE:  Note: Objective measures were completed at Evaluation unless otherwise noted.  DIAGNOSTIC FINDINGS:   PATIENT SURVEYS:  Eval:  Lower Extremity Functional Score: 5 / 80 = 6.3 % 06/16/24 LEFS 28 / 80 = 35.0 % 07/15/24:  Lower Extremity Functional Score:  19 / 80 = 23.8 % 09/22/2024:  Lower Extremity Functional Score: 18/80= 22.5%  COGNITION: Overall cognitive status: Within functional limits for tasks assessed     SENSATION: Poor sensation at Rt LE - light touch felt along posterior gastroc only no awareness without visualization of being touched at medial, lateral, anterior lower leg, reports numbness in bil feet Rt>Lt  EDEMA:  Rt ankle sometimes swells per pt  MUSCLE LENGTH: Bil hamstrings and adductors limited by 25%;  POSTURE: rounded shoulders and forward head  LOWER EXTREMITY ROM:  weakness and poor coordination/proprioception   LOWER EXTREMITY MMT:  02/04/24 hip flexion was R 3/5, L 4/5  MMT Right Eval */5 Left Eval */5 Right 5/16 Left 5/16 RIGHT 6/6 LEFT  6/6 Right 10/21 Left  10/21  Hip flexion 2 3 4+ 5 3  3  4+  Hip extension 2 3 4+ 4 4+ 4 3+ 4  Hip abduction 3 3+ 5- 5 5     Hip adduction 1+ 2+ 2+ 2+ 2+ 2+ 2+ 2+  Hip internal rotation          Hip external rotation          Knee flexion 2 3 3- 4 3+ 5 2+ 4  Knee extension 2+ 3 3- 5 3+     Ankle dorsiflexion 0 4 0 4+ long sitting 2 4  4+ long sitting 2      Ankle plantarflexion 3 4         Ankle inversion 1 4 3+ tested in long sitting 4+  tested in long sitting 3+ in long sitting 5 in long sitting    Ankle eversion 2 4 3+ tested in long sitting 4- tested in long sitting 2+ 4- tested in long sitting     (Blank rows = not tested)   FUNCTIONAL TESTS:  Five times Sit to Stand Test (FTSS) Method: Use a straight back chair with a solid seat that is 16-18 high. Ask participant to sit on the chair with arms folded across their chest.   Instructions: Stand up and sit down as quickly as possible 5 times, keeping your arms folded across your chest.   Measurement: Stop timing when the participant stands the 5th time.  TIME: ______ (in seconds)  Times > 13.6 seconds is associated with increased disability and morbidity (Guralnik, 2000) Times > 15 seconds is predictive of recurrent falls in healthy individuals aged 107 and older (Buatois, et al., 2008) Normal performance values in community dwelling individuals aged 61 and older (Bohannon, 2006): 50-59 years: 8 seconds 60-69 years: 11.4 seconds 70-79 years: 12.6 seconds 80-89 years: 14.8 seconds  MCID: >= 2.3 seconds for Vestibular Disorders (Meretta, 2006)  5 times sit to stand: 22s with hands - very unstable with poor hip, posture, core control  Timed up and go (TUG): 28s with SBQC - min A   07/15/24 37.10 sec (one stand with no UE support, 4 with B UE support and last two with min A upon standing)  09/22/2024: 5 times sit to stand:  13.02 sec with decreased muscle control noted Timed up and go (TUG):18.59 sec without assistive device   GAIT: Distance walked: 200' Assistive device utilized: [employer] Level of assistance: CGA Comments: Rt LE demonstrating ataxic pattern, Lt trunk lean intermittently  to right self, poor step height bil, decreased stride length, limited glute activation    06/16/24 266 ft with SPC, improved step height and step length, intermittent LOB if pt turns to speak to therapist, but pt  able to right herself independently. Minimal trunk lean.                                                                                                                               TREATMENT DATE:  10/10/24:t arrives for aquatic physical therapy. Treatment took place in 3.5-5.5 feet of water . Water  temperature was 91 degrees f. Pt entered the pool via steps independently with use of bil rails, HHA to get from walker to stairs. Pt requires buoyancy of water  for support and to offload joints with strengthening exercises.  Pt utilizes viscosity of the water  required for strengthening. Seated water  bench with 75% submersion for LE AROM/warm up/discussion of status.    -60% water  depth water  walking with rainbow floats  8x each direction with Vc for control of LE and really focusing on not circumducting her RTLE.  -static core with arm sway hold pink bells x20->alternating arm swing wide stance then staggered (left leg back better  control)sets of slow x 5 then fast x 5 -Ue support on water  bench bicycle 1.5 min.  -cycling in deep water  UEs support yellow hb. Cues for forward motion; hip flex/ext -reverse fly using yellow HB   10/06/24: Nustep L5 x 6 min Gait to Cancer gym x with RW 10 sit to stand with UE support staggered stance L back, then with chair + foam with R foot back x 8 Side stepping on AirEx beam in parallel bars with UE support as needed down and back x3 laps FWD step over 4 small hurdles in parallel bars with UE support as needed down and back x3 laps Side stepping  over 4 small hurdles in parallel bars with UE support as needed down and back x3 laps Free Motion Machine: Standing on purple balance pad for scap retract with 3#  2x10  Free Motion Machine: Standing on pad in walking stance with row x 10 bilat Free Motion Machine: Standing on pad shoulder D2 with 3# x 10 bilat Free Motion Machine: Standing Unilateral shoulder ext with contralateral march x 10 bilat cued to avoid post tilt  when marching R Stretching intermittently    10/03/24: Bike L0 x 2 min then fatigues Gait to Cancer gym x2 with RW 10 sit to stand with UE support in 30 sec. Side stepping on AirEx beam in parallel bars with UE support as needed down and back x3 laps FWD step over 4 small hurdles in parallel bars with UE support as needed down and back x3 laps Side stepping  over 4 small hurdles in parallel bars with UE support as needed down and back x3 laps Free Motion Machine: Standing on purple balance pad for scap retract with 3#  2x10  Free Motion Machine: Standing on pad in walking stance with row x 10 bilat Free Motion Machine: Standing on pad shoulder D2 with 3# x 10 bilat Free Motion Machine: Standing Unilateral shoulder ext with contralateral march x 10 bilat less reps on left side and post tilts pelvis Stretching intermittently Discussion of POC, status and goals   10/01/24:t arrives for aquatic physical therapy. Treatment took place in 3.5-5.5 feet of water . Water  temperature was 91 degrees f. Pt entered the pool via steps independently with use of bil rails, HHA to get from walker to stairs. Pt requires buoyancy of water  for support and to offload joints with strengthening exercises.  Pt utilizes viscosity of the water  required for strengthening. Seated water  bench with 75% submersion for LE AROM/warm up/discussion of status.    -60% water  depth water  walking with rainbow floats  8x each direction with Vc for control of LE and really focusing on not circumducting her RTLE.  -static core with arm sway hold pink bells x20 then tandem stance 20x each side -forward facing plank on water  bench; knees in and out 10x then bicycle 2 min. Rest, repeat.VC for hips up -Plank with RB hand weights, toes on floor  1 min 3x -Static hip add/abd 15x2 Bil  09/24/24:Pt arrives for aquatic physical therapy. Treatment took place in 3.5-5.5 feet of water . Water  temperature was 91 degrees f. Pt entered the pool via  steps independently with use of bil rails, HHA to get from walker to stairs. Pt requires buoyancy of water  for support and to offload joints with strengthening exercises.  Pt utilizes viscosity of the water  required for strengthening. Seated water  bench with 75% submersion: forward facing plank: knees in & out 10x f/b bicycle 2 min, rest, then  1 additional min.    -60% water  depth water  walking with rainbow floats  8x each direction with Vc for control of LE and really focusing on not circumducting her RTLE.  -static core with arm sway hold pink bells x20, then tandem stance 20x each side -forward facing plank on water  bench; knees in and out 10x then bicycle 30 sec. Rest, repeat 2x more.VC for hips up -Plank with RB hand weights, toes on floor  1 min 3x -Static hip add/abd 15x2 Bil   PATIENT EDUCATION:  Education details: [email or web address] - uses App Person educated: Patient Education method: Explanation, Demonstration, Tactile cues, Verbal cues, and Handouts Education comprehension: verbalized understanding, returned demonstration, verbal cues required, tactile cues required, and needs further education  HOME EXERCISE PROGRAM: Access Code: [email or web address] URL: [email or web address] Date: 07/07/2024 Prepared by: Mliss  Exercises - Supine Heel Slide with Strap  - 1 x daily - 7 x weekly - 3 sets - 10 reps - Pillow squeezes with kegel  - 1 x daily - 7 x weekly - 3 sets - 10 reps - Sit to Stand with Counter Support  - 1 x daily - 7 x weekly - 1 sets - 10 reps - Seated Long Arc Quad  - 1 x daily - 7 x weekly - 1-3 sets - 10 reps - 5 sec hold - Seated Isometric Hip Abduction with Resistance  - 1 x daily - 7 x weekly - 1-3 sets - 10 reps - Heel Raises with Counter Support  - 1 x daily - 7 x weekly - 1-3 sets - 10 reps - Standing Terminal Knee Extension at Wall with Ball  - 2 x daily - 7 x weekly - 2 sets - 10 reps - 5 sec hold - Squat with Chair Touch and Resistance Loop  - 1 x daily - 3 x  weekly - 2 sets - 10 reps - Standing Knee Flexion with Counter Support  - 1 x daily - 3 x weekly - 2 sets - 10 reps - Supine Bridge with Resistance Band  - 1 x daily - 7 x weekly - 1-2 sets - 10 reps - 5 sec hold - Standing Hip Abduction on Slider  - 1 x daily - 3 x weekly - 2 sets - 10 reps - Calf stretch on rocker board  - 1 x daily - 7 x weekly - 1 sets - 3 reps - 30 sec  hold - Standing Anti-Rotation Press with Anchored Resistance  - 1 x daily - 3 x weekly - 2 sets - 10 reps - Standing Trunk Rotation with Resistance  - 1 x daily - 3 x weekly - 2 sets - 10 reps - Right Modified Dix Hallpike with Pillows  - 1 x daily - 1 x weekly - 1 sets - 1 reps - 30 sec hold  ASSESSMENT:  CLINICAL IMPRESSION:  Trialed new positions for core engagement with good toleration. She had difficulty controlling upright positioning and level pelvis with RLE/sided core control. Good session.  Some fatigue upon completion.     Eval: Patient is a 59 y.o. female  who was seen today for physical therapy evaluation and tr in the teatment for poor gait mechanics, impaired posture, impaired balance, decreased hip and core strength, increased fall risk. Pt has complex history of  Radiation-induced lumbosacral plexopathy and Polyradiculoneuropathy status post cervical cancer. Pt has completed pelvic floor PT recently and had positive outcomes with this and resolution of symptoms. Pt very motivated to  participate in PT for bil LE strengthening and decreased fall risk. Pt demonstrated impairments with objective measurements of TUG and 5xSTS indicative of increased fall risk and balance deficits. Pt reports she is unable to hold and carry grandchildren, to walk on uneven surfaces, needs assistance with dressing, walking, transfers sometimes and wants to be more I. Pt would benefit from additional PT to further address deficits.    OBJECTIVE IMPAIRMENTS: Abnormal gait, decreased activity tolerance, decreased coordination, decreased  endurance, decreased mobility, difficulty walking, decreased strength, increased fascial restrictions, increased muscle spasms, impaired flexibility, impaired sensation, improper body mechanics, postural dysfunction, and pain.   ACTIVITY LIMITATIONS: carrying, lifting, bending, sitting, standing, squatting, stairs, transfers, bed mobility, locomotion level, and caring for others  PARTICIPATION LIMITATIONS: meal prep, cleaning, laundry, interpersonal relationship, driving, shopping, community activity, occupation, and yard work  PERSONAL FACTORS: Fitness, Past/current experiences, Time since onset of injury/illness/exacerbation, and 1 comorbidity: medical history are also affecting patient's functional outcome.   REHAB POTENTIAL: Good  CLINICAL DECISION MAKING: Evolving/moderate complexity  EVALUATION COMPLEXITY: Moderate   GOALS: Goals reviewed with patient? Yes  SHORT TERM GOALS: Target date: 02/13/24 Pt to be I with HEP for carry over and continuing recommendations for improved outcomes.   Baseline: Goal status: MET  2.  Pt to demonstrate at least 3/5 Rt LE strength grossly and 4/5 Lt for improved pelvic stability and functional squats without falling.  Baseline:  Goal status:DEFERRED  3.  Pt to demonstrate ability to complete static standing without AD for at least 5 mins at midline to complete gentle kitchen tasks/household tasks without fear of falling.  Baseline:  Goal status: MET  4.  Pt to demonstrate I with dynamic sitting balance to better be able to reach floor level for retrieving fallen objects and putting on socks without falling Baseline:  Goal status: MET 03/03/24  LONG TERM GOALS: Target date: 12/05/24  Pt to be I with advanced HEP for carry over and continuing recommendations for improved outcomes.   Baseline:  Goal status: ongoing  2.  Pt to report improved LEFS score to at least 30/80 for improved I with functional tasks at home such as dressing, walking from  room to room.  Baseline:  Goal status: IN PROGRESS 28/80 06/16/24; 07/15/24  19 / 80 = 23.8 %  3.  Pt to demonstrate improved dynamic standing balance with or without AD at no more than CGA to better be able to play with grandchildren without falling.  Baseline:  Goal status: DEFERRED  4.  Pt to demonstrate improved gait mechanics with LRAD for 50 ' with minimal deviation from straight path to allow safe ambulation at home. Baseline:  Goal status: MET 10/03/24   5.  Pt to demonstrate ability to perform x5 Sit to stand with at least 10# to more safely assist in caring for grandchildren without risk of falling.  Baseline:  Goal status: DEFERRED  6.  Pt able to perform 15 sit to stands in 30 sec  without UE assist showing improved functional LE strength Baseline: 10 reps in 30 sec 10/03/24 Goal status: MODIFIED 10/03/24  7.  Patient to report no falls when using AD at home or in the community. Baseline: using rolling walker at home and community (07/28/24) Goal status: ONGOING  10/03/24  8.  Increase endurance with walker to allow shopping. .  Baseline: Patient able to walk from parking lot to pool and is exhausted. Goal status: NEW  9. Improved ability to clear hurdles x 3 with  good form and minimal compensation.   Baseline: Able to clear 2 hurdles Goal status: NEW      PLAN:  PT FREQUENCY: 3x/week  PT DURATION: 8 weeks  PLANNED INTERVENTIONS:    97110-Therapeutic exercises, 97530- Therapeutic activity, 97112- Neuromuscular re-education, [phone]- Self Care, 02859- Manual therapy, [phone]- Gait training, [phone]- Canalith repositioning, [id number]- Aquatic Therapy, [phone]- Splinting, [id number]- Electrical stimulation (manual), [id number]- Vasopneumatic device, [id number]- Ultrasound, [id number]- Traction (mechanical), [id number]- Ionotophoresis 4mg /ml Dexamethasone , Patient/Family education, Balance training, Stair training, Taping, Dry Needling, Joint mobilization, Joint manipulation, Spinal manipulation, Spinal  mobilization, Scar mobilization, DME instructions, Wheelchair mobility training, Cryotherapy, Moist heat, and Biofeedback  NEXT VISIT: for aquatic PT, Work on ADDuction, core and LE strengthening and neuromuscular re-ed improved gait and mobility LAND: continue with gait training with  walker, sit to stands with UE support, core and LE strength (hip flexion/ext)  Ronal Kem) Baleria Wyman MPT 10/10/24 11:46 AM [facility] [address] [place], [place], [address] Phone: [phone]   Fax:  [phone]   "

## 2024-10-15 ENCOUNTER — Ambulatory Visit: Admitting: Physical Therapy

## 2024-10-16 ENCOUNTER — Inpatient Hospital Stay (HOSPITAL_BASED_OUTPATIENT_CLINIC_OR_DEPARTMENT_OTHER): Admission: RE | Admit: 2024-10-16 | Source: Ambulatory Visit

## 2024-10-16 ENCOUNTER — Encounter (HOSPITAL_BASED_OUTPATIENT_CLINIC_OR_DEPARTMENT_OTHER): Payer: Self-pay

## 2024-10-17 ENCOUNTER — Ambulatory Visit: Admitting: Physical Therapy

## 2024-10-20 ENCOUNTER — Ambulatory Visit: Admitting: Physical Therapy

## 2024-10-21 ENCOUNTER — Other Ambulatory Visit: Payer: Self-pay | Admitting: Family Medicine

## 2024-10-21 DIAGNOSIS — I10 Essential (primary) hypertension: Secondary | ICD-10-CM

## 2024-10-22 ENCOUNTER — Ambulatory Visit

## 2024-10-24 ENCOUNTER — Ambulatory Visit: Admitting: Physical Therapy

## 2024-10-29 ENCOUNTER — Ambulatory Visit: Admitting: Physical Therapy

## 2024-10-31 ENCOUNTER — Ambulatory Visit: Admitting: Physical Therapy

## 2024-11-03 ENCOUNTER — Ambulatory Visit: Admitting: Physical Therapy

## 2024-11-04 ENCOUNTER — Ambulatory Visit (HOSPITAL_BASED_OUTPATIENT_CLINIC_OR_DEPARTMENT_OTHER)

## 2024-11-05 ENCOUNTER — Ambulatory Visit: Admitting: Physical Therapy

## 2024-11-07 ENCOUNTER — Ambulatory Visit: Admitting: Physical Therapy

## 2024-11-10 ENCOUNTER — Ambulatory Visit: Admitting: Physical Therapy

## 2024-11-12 ENCOUNTER — Ambulatory Visit: Admitting: Physical Therapy

## 2024-11-12 ENCOUNTER — Ambulatory Visit: Admitting: Neurology

## 2024-11-14 ENCOUNTER — Ambulatory Visit (HOSPITAL_BASED_OUTPATIENT_CLINIC_OR_DEPARTMENT_OTHER): Admitting: Physical Therapy

## 2024-11-17 ENCOUNTER — Ambulatory Visit: Admitting: Physical Therapy

## 2024-11-19 ENCOUNTER — Ambulatory Visit: Admitting: Physical Therapy

## 2024-11-21 ENCOUNTER — Ambulatory Visit (HOSPITAL_BASED_OUTPATIENT_CLINIC_OR_DEPARTMENT_OTHER): Admitting: Physical Therapy

## 2024-11-26 ENCOUNTER — Ambulatory Visit: Admitting: Physical Therapy

## 2024-11-28 ENCOUNTER — Ambulatory Visit (HOSPITAL_BASED_OUTPATIENT_CLINIC_OR_DEPARTMENT_OTHER): Admitting: Physical Therapy

## 2024-12-01 ENCOUNTER — Ambulatory Visit: Admitting: Physical Therapy

## 2024-12-03 ENCOUNTER — Ambulatory Visit: Admitting: Physical Therapy

## 2024-12-05 ENCOUNTER — Ambulatory Visit: Admitting: Physical Therapy

## 2025-02-23 ENCOUNTER — Ambulatory Visit: Admitting: Family Medicine

## 2025-03-11 ENCOUNTER — Ambulatory Visit: Admitting: Neurology

## 2025-03-23 ENCOUNTER — Ambulatory Visit: Admitting: Neurology
